# Patient Record
Sex: Female | Born: 1940 | Race: White | Hispanic: No | Marital: Married | Smoking: Never smoker
Health system: Southern US, Community
[De-identification: ages and names within clinical notes are randomized; demographics above are authoritative.]

## PROBLEM LIST (undated history)

## (undated) DIAGNOSIS — M1990 Unspecified osteoarthritis, unspecified site: Secondary | ICD-10-CM

## (undated) DIAGNOSIS — E876 Hypokalemia: Secondary | ICD-10-CM

## (undated) DIAGNOSIS — H269 Unspecified cataract: Secondary | ICD-10-CM

## (undated) DIAGNOSIS — I10 Essential (primary) hypertension: Secondary | ICD-10-CM

## (undated) DIAGNOSIS — G2579 Other drug induced movement disorders: Secondary | ICD-10-CM

## (undated) DIAGNOSIS — I2510 Atherosclerotic heart disease of native coronary artery without angina pectoris: Secondary | ICD-10-CM

## (undated) DIAGNOSIS — R609 Edema, unspecified: Secondary | ICD-10-CM

## (undated) DIAGNOSIS — I219 Acute myocardial infarction, unspecified: Secondary | ICD-10-CM

## (undated) DIAGNOSIS — K648 Other hemorrhoids: Secondary | ICD-10-CM

## (undated) DIAGNOSIS — R600 Localized edema: Secondary | ICD-10-CM

## (undated) DIAGNOSIS — I499 Cardiac arrhythmia, unspecified: Secondary | ICD-10-CM

## (undated) DIAGNOSIS — I480 Paroxysmal atrial fibrillation: Secondary | ICD-10-CM

## (undated) DIAGNOSIS — K5790 Diverticulosis of intestine, part unspecified, without perforation or abscess without bleeding: Secondary | ICD-10-CM

## (undated) DIAGNOSIS — K219 Gastro-esophageal reflux disease without esophagitis: Secondary | ICD-10-CM

## (undated) DIAGNOSIS — E1142 Type 2 diabetes mellitus with diabetic polyneuropathy: Secondary | ICD-10-CM

## (undated) DIAGNOSIS — R42 Dizziness and giddiness: Secondary | ICD-10-CM

## (undated) DIAGNOSIS — E119 Type 2 diabetes mellitus without complications: Secondary | ICD-10-CM

## (undated) DIAGNOSIS — I495 Sick sinus syndrome: Secondary | ICD-10-CM

## (undated) DIAGNOSIS — R079 Chest pain, unspecified: Secondary | ICD-10-CM

## (undated) DIAGNOSIS — G459 Transient cerebral ischemic attack, unspecified: Secondary | ICD-10-CM

## (undated) DIAGNOSIS — R0600 Dyspnea, unspecified: Secondary | ICD-10-CM

## (undated) DIAGNOSIS — K644 Residual hemorrhoidal skin tags: Secondary | ICD-10-CM

## (undated) DIAGNOSIS — E785 Hyperlipidemia, unspecified: Secondary | ICD-10-CM

## (undated) DIAGNOSIS — M797 Fibromyalgia: Secondary | ICD-10-CM

## (undated) DIAGNOSIS — R9439 Abnormal result of other cardiovascular function study: Secondary | ICD-10-CM

## (undated) DIAGNOSIS — I639 Cerebral infarction, unspecified: Secondary | ICD-10-CM

## (undated) DIAGNOSIS — G43109 Migraine with aura, not intractable, without status migrainosus: Secondary | ICD-10-CM

## (undated) DIAGNOSIS — T7840XA Allergy, unspecified, initial encounter: Secondary | ICD-10-CM

## (undated) HISTORY — DX: Other hemorrhoids: K64.8

## (undated) HISTORY — DX: Edema, unspecified: R60.9

## (undated) HISTORY — DX: Transient cerebral ischemic attack, unspecified: G45.9

## (undated) HISTORY — DX: Allergy, unspecified, initial encounter: T78.40XA

## (undated) HISTORY — DX: Fibromyalgia: M79.7

## (undated) HISTORY — DX: Localized edema: R60.0

## (undated) HISTORY — DX: Chest pain, unspecified: R07.9

## (undated) HISTORY — PX: NECK SURGERY: SHX720

## (undated) HISTORY — DX: Unspecified cataract: H26.9

## (undated) HISTORY — DX: Type 2 diabetes mellitus without complications: E11.9

## (undated) HISTORY — PX: PACEMAKER INSERTION: SHX728

## (undated) HISTORY — PX: APPENDECTOMY: SHX54

## (undated) HISTORY — DX: Essential (primary) hypertension: I10

## (undated) HISTORY — DX: Other drug induced movement disorders: G25.79

## (undated) HISTORY — DX: Type 2 diabetes mellitus with diabetic polyneuropathy: E11.42

## (undated) HISTORY — DX: Hyperlipidemia, unspecified: E78.5

## (undated) HISTORY — DX: Cerebral infarction, unspecified: I63.9

## (undated) HISTORY — PX: UPPER GASTROINTESTINAL ENDOSCOPY: SHX188

## (undated) HISTORY — DX: Atherosclerotic heart disease of native coronary artery without angina pectoris: I25.10

## (undated) HISTORY — DX: Residual hemorrhoidal skin tags: K64.4

## (undated) HISTORY — DX: Cardiac arrhythmia, unspecified: I49.9

## (undated) HISTORY — DX: Gastro-esophageal reflux disease without esophagitis: K21.9

## (undated) HISTORY — DX: Migraine with aura, not intractable, without status migrainosus: G43.109

## (undated) HISTORY — PX: COLONOSCOPY: SHX5424

## (undated) HISTORY — DX: Unspecified osteoarthritis, unspecified site: M19.90

## (undated) HISTORY — PX: OTHER SURGICAL HISTORY: SHX169

## (undated) HISTORY — DX: Sick sinus syndrome: I49.5

## (undated) HISTORY — DX: Paroxysmal atrial fibrillation: I48.0

## (undated) HISTORY — DX: Dizziness and giddiness: R42

## (undated) HISTORY — DX: Dyspnea, unspecified: R06.00

## (undated) HISTORY — DX: Hypokalemia: E87.6

## (undated) HISTORY — PX: KNEE SURGERY: SHX244

## (undated) HISTORY — DX: Acute myocardial infarction, unspecified: I21.9

## (undated) HISTORY — DX: Abnormal result of other cardiovascular function study: R94.39

## (undated) HISTORY — DX: Diverticulosis of intestine, part unspecified, without perforation or abscess without bleeding: K57.90

## (undated) HISTORY — PX: ABDOMINAL HYSTERECTOMY: SHX81

---

## 1998-04-28 ENCOUNTER — Emergency Department (HOSPITAL_COMMUNITY): Admission: EM | Admit: 1998-04-28 | Discharge: 1998-04-28 | Payer: Self-pay | Admitting: Emergency Medicine

## 1998-09-05 ENCOUNTER — Ambulatory Visit (HOSPITAL_COMMUNITY): Admission: RE | Admit: 1998-09-05 | Discharge: 1998-09-05 | Payer: Self-pay | Admitting: Family Medicine

## 1998-09-12 ENCOUNTER — Emergency Department (HOSPITAL_COMMUNITY): Admission: EM | Admit: 1998-09-12 | Discharge: 1998-09-12 | Payer: Self-pay | Admitting: Emergency Medicine

## 1998-09-12 ENCOUNTER — Encounter: Payer: Self-pay | Admitting: Emergency Medicine

## 1998-12-26 ENCOUNTER — Ambulatory Visit (HOSPITAL_COMMUNITY): Admission: RE | Admit: 1998-12-26 | Discharge: 1998-12-26 | Payer: Self-pay | Admitting: Orthopedic Surgery

## 1999-10-16 ENCOUNTER — Encounter: Payer: Self-pay | Admitting: Family Medicine

## 1999-10-16 ENCOUNTER — Encounter: Admission: RE | Admit: 1999-10-16 | Discharge: 1999-10-16 | Payer: Self-pay | Admitting: Family Medicine

## 2000-04-19 ENCOUNTER — Encounter: Payer: Self-pay | Admitting: Family Medicine

## 2000-04-19 ENCOUNTER — Encounter: Admission: RE | Admit: 2000-04-19 | Discharge: 2000-04-19 | Payer: Self-pay | Admitting: Family Medicine

## 2000-10-19 ENCOUNTER — Encounter: Admission: RE | Admit: 2000-10-19 | Discharge: 2000-10-19 | Payer: Self-pay | Admitting: Family Medicine

## 2000-10-19 ENCOUNTER — Other Ambulatory Visit: Admission: RE | Admit: 2000-10-19 | Discharge: 2000-10-19 | Payer: Self-pay | Admitting: Family Medicine

## 2000-10-19 ENCOUNTER — Encounter: Payer: Self-pay | Admitting: Family Medicine

## 2001-06-27 ENCOUNTER — Emergency Department (HOSPITAL_COMMUNITY): Admission: EM | Admit: 2001-06-27 | Discharge: 2001-06-27 | Payer: Self-pay | Admitting: *Deleted

## 2001-10-13 ENCOUNTER — Encounter: Payer: Self-pay | Admitting: Emergency Medicine

## 2001-10-13 ENCOUNTER — Emergency Department (HOSPITAL_COMMUNITY): Admission: EM | Admit: 2001-10-13 | Discharge: 2001-10-13 | Payer: Self-pay | Admitting: Emergency Medicine

## 2001-11-25 ENCOUNTER — Other Ambulatory Visit: Admission: RE | Admit: 2001-11-25 | Discharge: 2001-11-25 | Payer: Self-pay | Admitting: Family Medicine

## 2001-11-25 ENCOUNTER — Encounter: Admission: RE | Admit: 2001-11-25 | Discharge: 2001-11-25 | Payer: Self-pay | Admitting: Family Medicine

## 2001-11-25 ENCOUNTER — Encounter: Payer: Self-pay | Admitting: Family Medicine

## 2001-12-13 ENCOUNTER — Ambulatory Visit (HOSPITAL_COMMUNITY): Admission: RE | Admit: 2001-12-13 | Discharge: 2001-12-13 | Payer: Self-pay | Admitting: Orthopedic Surgery

## 2002-12-01 ENCOUNTER — Encounter: Admission: RE | Admit: 2002-12-01 | Discharge: 2002-12-01 | Payer: Self-pay | Admitting: Family Medicine

## 2002-12-01 ENCOUNTER — Encounter: Payer: Self-pay | Admitting: Family Medicine

## 2002-12-05 ENCOUNTER — Other Ambulatory Visit: Admission: RE | Admit: 2002-12-05 | Discharge: 2002-12-05 | Payer: Self-pay | Admitting: Family Medicine

## 2003-05-28 ENCOUNTER — Encounter: Payer: Self-pay | Admitting: Emergency Medicine

## 2003-05-28 ENCOUNTER — Inpatient Hospital Stay (HOSPITAL_COMMUNITY): Admission: EM | Admit: 2003-05-28 | Discharge: 2003-05-30 | Payer: Self-pay | Admitting: Emergency Medicine

## 2003-12-13 ENCOUNTER — Encounter: Admission: RE | Admit: 2003-12-13 | Discharge: 2003-12-13 | Payer: Self-pay | Admitting: Family Medicine

## 2005-03-11 ENCOUNTER — Encounter: Admission: RE | Admit: 2005-03-11 | Discharge: 2005-03-11 | Payer: Self-pay | Admitting: Family Medicine

## 2005-06-05 ENCOUNTER — Encounter: Admission: RE | Admit: 2005-06-05 | Discharge: 2005-06-05 | Payer: Self-pay | Admitting: Family Medicine

## 2005-07-08 ENCOUNTER — Inpatient Hospital Stay (HOSPITAL_COMMUNITY): Admission: RE | Admit: 2005-07-08 | Discharge: 2005-07-09 | Payer: Self-pay | Admitting: Orthopaedic Surgery

## 2006-02-27 ENCOUNTER — Emergency Department (HOSPITAL_COMMUNITY): Admission: EM | Admit: 2006-02-27 | Discharge: 2006-02-27 | Payer: Self-pay | Admitting: Family Medicine

## 2006-06-18 ENCOUNTER — Encounter: Admission: RE | Admit: 2006-06-18 | Discharge: 2006-06-18 | Payer: Self-pay | Admitting: Family Medicine

## 2006-07-12 ENCOUNTER — Ambulatory Visit (HOSPITAL_COMMUNITY): Admission: RE | Admit: 2006-07-12 | Discharge: 2006-07-13 | Payer: Self-pay | Admitting: *Deleted

## 2007-07-13 DIAGNOSIS — I2510 Atherosclerotic heart disease of native coronary artery without angina pectoris: Secondary | ICD-10-CM | POA: Insufficient documentation

## 2007-07-21 ENCOUNTER — Encounter: Admission: RE | Admit: 2007-07-21 | Discharge: 2007-07-21 | Payer: Self-pay | Admitting: Family Medicine

## 2008-11-22 ENCOUNTER — Encounter: Admission: RE | Admit: 2008-11-22 | Discharge: 2008-11-22 | Payer: Self-pay | Admitting: Family Medicine

## 2009-11-27 ENCOUNTER — Encounter: Admission: RE | Admit: 2009-11-27 | Discharge: 2009-11-27 | Payer: Self-pay | Admitting: Family Medicine

## 2009-12-17 ENCOUNTER — Encounter (INDEPENDENT_AMBULATORY_CARE_PROVIDER_SITE_OTHER): Payer: Self-pay

## 2009-12-20 ENCOUNTER — Ambulatory Visit: Payer: Self-pay | Admitting: Internal Medicine

## 2010-01-01 ENCOUNTER — Ambulatory Visit: Payer: Self-pay | Admitting: Internal Medicine

## 2010-06-28 ENCOUNTER — Emergency Department (HOSPITAL_BASED_OUTPATIENT_CLINIC_OR_DEPARTMENT_OTHER): Admission: EM | Admit: 2010-06-28 | Discharge: 2010-06-28 | Payer: Self-pay | Admitting: Emergency Medicine

## 2010-09-25 ENCOUNTER — Encounter: Payer: Self-pay | Admitting: Internal Medicine

## 2010-11-18 ENCOUNTER — Ambulatory Visit
Admission: RE | Admit: 2010-11-18 | Discharge: 2010-11-18 | Payer: Self-pay | Source: Home / Self Care | Attending: Internal Medicine | Admitting: Internal Medicine

## 2010-11-18 ENCOUNTER — Encounter: Payer: Self-pay | Admitting: Internal Medicine

## 2010-11-18 DIAGNOSIS — I10 Essential (primary) hypertension: Secondary | ICD-10-CM | POA: Insufficient documentation

## 2010-11-18 DIAGNOSIS — Z950 Presence of cardiac pacemaker: Secondary | ICD-10-CM | POA: Insufficient documentation

## 2010-11-18 DIAGNOSIS — I498 Other specified cardiac arrhythmias: Secondary | ICD-10-CM | POA: Insufficient documentation

## 2010-12-04 NOTE — Letter (Signed)
Summary: [facility] Instructions  [facility]  [address] [place], [place] [address]   Phone: [phone]  Fax: [phone]       [name]    [birth year]    MRN: [id number]        Procedure Day Dorna Bloom: Wednesday 01/01/2010     Arrival Time: 8:00 am      Procedure Time: 9:00 am     Location of Procedure:                    _ x_  [facility] (4th Floor)   PREPARATION FOR COLONOSCOPY WITH MOVIPREP   Starting 5 days prior to your procedure Friday 2/25 do not eat nuts, seeds, popcorn, corn, beans, peas,  salads, or any raw vegetables.  Do not take any fiber supplements (e.g. Metamucil, Citrucel, and Benefiber).  THE DAY BEFORE YOUR PROCEDURE         DATE: Tuesday 3/1  1.  Drink clear liquids the entire day-NO SOLID FOOD  2.  Do not drink anything colored red or purple.  Avoid juices with pulp.  No orange juice.  3.  Drink at least 64 oz. (8 glasses) of fluid/clear liquids during the day to prevent dehydration and help the prep work efficiently.  CLEAR LIQUIDS INCLUDE: Water Jello Ice Popsicles Tea (sugar ok, no milk/cream) Powdered fruit flavored drinks Coffee (sugar ok, no milk/cream) Gatorade Juice: apple, white grape, white cranberry  Lemonade Clear bullion, consomm, broth Carbonated beverages (any kind) Strained chicken noodle soup Hard Candy                             4.  In the morning, mix first dose of MoviPrep solution:    Empty 1 Pouch A and 1 Pouch B into the disposable container    Add lukewarm drinking water to the top line of the container. Mix to dissolve    Refrigerate (mixed solution should be used within 24 hrs)  5.  Begin drinking the prep at 5:00 p.m. The MoviPrep container is divided by 4 marks.   Every 15 minutes drink the solution down to the next mark (approximately 8 oz) until the full liter is complete.   6.  Follow completed prep with 16 oz of clear liquid of your choice (Nothing red or purple).   Continue to drink clear liquids until bedtime.  7.  Before going to bed, mix second dose of MoviPrep solution:    Empty 1 Pouch A and 1 Pouch B into the disposable container    Add lukewarm drinking water to the top line of the container. Mix to dissolve    Refrigerate  THE DAY OF YOUR PROCEDURE      DATE: wednesday 3/2  Beginning at 4:00 a.m. (5 hours before procedure):         1. Every 15 minutes, drink the solution down to the next mark (approx 8 oz) until the full liter is complete.  2. Follow completed prep with 16 oz. of clear liquid of your choice.    3. You may drink clear liquids until 7:00 am (2 HOURS BEFORE PROCEDURE).   MEDICATION INSTRUCTIONS  Unless otherwise instructed, you should take regular prescription medications with a small sip of water   as early as possible the morning of your procedure.  Additional medication instructions: Do not take fluid pill am of procedure.  OTHER INSTRUCTIONS  You will need a responsible adult at least 68 years of age to accompany you and drive you home.   This person must remain in the waiting room during your procedure.  Wear loose fitting clothing that is easily removed.  Leave jewelry and other valuables at home.  However, you may wish to bring a book to read or  an iPod/MP3 player to listen to music as you wait for your procedure to start.  Remove all body piercing jewelry and leave at home.  Total time from sign-in until discharge is approximately 2-3 hours.  You should go home directly after your procedure and rest.  You can resume normal activities the  day after your procedure.  The day of your procedure you should not:   Drive   Make legal decisions   Operate machinery   Drink alcohol   Return to work  You will receive specific instructions about eating, activities and medications before you leave.    The above instructions have been reviewed and explained to me by   Ulis Rias RN   December 20, 2009 9:02 AM    I fully understand and can verbalize these instructions _____________________________ Date _________

## 2010-12-04 NOTE — Assessment & Plan Note (Signed)
Summary: pacer check/medtronic   History of Present Illness: [name] is referred today by Dr. Swaziland for ongoing PPM evaluation.  She was previously followed by Dr. Reyes Ivan.  She underwent PPM in 2007. Since then she has done well from an arrhythmia perspective. She notes that she walks regularly and she has had some dyspnea but is followed for this by Dr. Swaziland. No c/p or peripheral edema. She denies syncope or near syncope. She denies pain around her PPM site.  Past History:  Past Medical History: DM Dylipidemia  Family History: No premature CAD  Social History: Denies Tobacco or ETOH abuse  Review of Systems       All systems reviewed and negative except as noted in the HPI.  Vital Signs:  Patient profile:   70 year old female Weight:      170 pounds Pulse rate:   62 / minute BP sitting:   104 / 60  (left arm)  Vitals Entered By: Laurance Flatten CMA (November 18, 2010 4:22 PM)  Physical Exam  General:  Middle aged, well developed, well nourished, in no acute distress.  HEENT: normal Neck: supple. No JVD. Carotids 2+ bilaterally no bruits Cor: RRR no rubs, gallops or murmur Lungs: CTA Ab: soft, nontender. nondistended. No HSM. Good bowel sounds Ext: warm. no cyanosis, clubbing or edema Neuro: alert and oriented. Grossly nonfocal. affect pleasant    PPM Specifications Following MD:  Lewayne Bunting, MD     Referring MD:  Vonna Drafts PPM Vendor:  Medtronic     PPM Model Number:  [id number]     PPM Serial Number:  [id number] PPM DOI:  07/12/2006     PPM Implanting MD:  Charlynn Court  Lead 1    Location: RA     DOI: 07/12/2006     Model #: [id number]     Serial #: [id number]     Status: active Lead 2    Location: RV     DOI: 07/12/2006     Model #: [id number]     Serial #: [id number]     Status: active  Magnet Response Rate:  BOL 85 ERI 65  Indications:  Sick sinus syndrome   PPM Follow Up Remote Check?  No Battery Voltage:  2.77 V     Battery Est. Longevity:  6  years     Pacer Dependent:  Yes       PPM Device Measurements Atrium  Impedance: 461 ohms, Threshold: 0.875 V at 0.4 msec Right Ventricle  Amplitude: 16 mV, Impedance: 537 ohms, Threshold: 0.75 V at 0.4 msec  Episodes MS Episodes:  8     Percent Mode Switch:  <0.1%     Coumadin:  No Ventricular High Rate:  0     Atrial Pacing:  95.6%     Ventricular Pacing:  0.2%  Parameters Mode:  DDDR+     Lower Rate Limit:  60     Upper Rate Limit:  130 Paced AV Delay:  150     Sensed AV Delay:  120 Next Remote Date:  02/19/2011     Next Cardiology Appt Due:  11/03/2011 Tech Comments:  No parameter changes. Device function normal.  Carelink transmissions every 3 months.  ROV 1 year with Dr. Ladona Ridgel. Altha Harm, LPN  November 18, 2010 4:18 PM  MD Comments:  Agree with above.  Impression & Recommendations:  Problem # 1:  BRADYCARDIA (ICD-427.89) She has had no recurrent symptoms since her PPM. Will follow. Her updated  medication list for this problem includes:    Lisinopril 40 Mg Tabs (Lisinopril) .Marland Kitchen... Take one tablet by mouth daily    Aspirin 81 Mg Tbec (Aspirin) .Marland Kitchen... Take one tablet by mouth daily    Amlodipine Besylate 2.5 Mg Tabs (Amlodipine besylate) .Marland Kitchen... Take one tablet by mouth daily  Problem # 2:  CARDIAC PACEMAKER IN SITU (ICD-V45.01) Her device is working normally. Will follow.  Problem # 3:  ESSENTIAL HYPERTENSION, BENIGN (ICD-401.1) Her blood pressure is very well controlled on medical therapy. No change in her meds. Her updated medication list for this problem includes:    Lisinopril 40 Mg Tabs (Lisinopril) .Marland Kitchen... Take one tablet by mouth daily    Aspirin 81 Mg Tbec (Aspirin) .Marland Kitchen... Take one tablet by mouth daily    Hydrochlorothiazide 25 Mg Tabs (Hydrochlorothiazide) .Marland Kitchen... Take one tablet by mouth daily.    Amlodipine Besylate 2.5 Mg Tabs (Amlodipine besylate) .Marland Kitchen... Take one tablet by mouth daily  Patient Instructions: 1)  Your physician wants you to follow-up in:  12 months  with Dr Court Joy will receive a reminder letter in the mail two months in advance. If you don't receive a letter, please call our office to schedule the follow-up appointment. 2)  Your physician recommends that you continue on your current medications as directed. Please refer to the Current Medication list given to you today.

## 2010-12-04 NOTE — Assessment & Plan Note (Signed)
Summary: [facility] Previsit  Clinical Lists Changes  Medications: Added new medication of MOVIPREP 100 GM  SOLR (PEG-KCL-NACL-NASULF-NA ASC-C) As per prep instructions. - Signed Rx of MOVIPREP 100 GM  SOLR (PEG-KCL-NACL-NASULF-NA ASC-C) As per prep instructions.;  #1 x 0;  Signed;  Entered by: Ulis Rias RN;  Authorized by: Iva Boop MD, [facility];  Method used: [facility]  [place] [address], [place], [place]  [address], Ph: [phone] or [phone], Fax: [phone] Observations: Added new observation of NKA: T (12/20/2009 8:49)    Prescriptions: MOVIPREP 100 GM  SOLR (PEG-KCL-NACL-NASULF-NA ASC-C) As per prep instructions.  #1 x 0   Entered by:   Ulis Rias RN   Authorized by:   Iva Boop MD, [facility]   Signed by:   Ulis Rias RN on 12/20/2009   Method used:   [facility]ronically to        [facility]  [place] [address] (retail)       [address]       [place], [place]  [address]       Ph: [phone] or [phone]       Fax: [phone]   RxID:   [id number]     Signed by Ulis Rias RN on 12/20/2009 at 9:01 AM

## 2010-12-04 NOTE — Miscellaneous (Signed)
Summary: Device preload  Clinical Lists Changes  Observations: Added new observation of PPM INDICATN: Sick sinus syndrome (09/25/2010 10:30) Added new observation of MAGNET RTE: BOL 85 ERI 65 (09/25/2010 10:30) Added new observation of PPMLEADSTAT2: active (09/25/2010 10:30) Added new observation of PPMLEADSER2: [id number] (09/25/2010 10:30) Added new observation of PPMLEADMOD2: 5076  (09/25/2010 10:30) Added new observation of PPMLEADDOI2: 07/12/2006  (09/25/2010 10:30) Added new observation of PPMLEADLOC2: RV  (09/25/2010 10:30) Added new observation of PPMLEADSTAT1: active  (09/25/2010 10:30) Added new observation of PPMLEADSER1: [id number]  (09/25/2010 10:30) Added new observation of PPMLEADMOD1: 5076  (09/25/2010 10:30) Added new observation of PPMLEADDOI1: 07/12/2006  (09/25/2010 10:30) Added new observation of PPMLEADLOC1: RA  (09/25/2010 10:30) Added new observation of PPM DOI: 07/12/2006  (09/25/2010 10:30) Added new observation of PPM SERL#: [id number]  (09/25/2010 10:30) Added new observation of PPM MODL#: [id number]  (09/25/2010 65:78) Added new observation of PACEMAKERMFG: Medtronic  (09/25/2010 10:30) Added new observation of PPM IMP MD: Charlynn Court  (09/25/2010 10:30) Added new observation of PPM REFER MD: Vonna Drafts  (09/25/2010 10:30) Added new observation of PACEMAKER MD: Lewayne Bunting, MD  (09/25/2010 10:30)      PPM Specifications Following MD:  Lewayne Bunting, MD     Referring MD:  Vonna Drafts PPM Vendor:  Medtronic     PPM Model Number:  ADDR01     PPM Serial Number:  [id number] PPM DOI:  07/12/2006     PPM Implanting MD:  Charlynn Court  Lead 1    Location: RA     DOI: 07/12/2006     Model #: [id number]     Serial #: [id number]     Status: active Lead 2    Location: RV     DOI: 07/12/2006     Model #: 6644     Serial #: [id number]     Status: active  Magnet Response Rate:  BOL 85 ERI 65  Indications:  Sick sinus syndrome

## 2010-12-04 NOTE — Procedures (Signed)
Summary: Colonoscopy  Patient: [name] Note: All result statuses are Final unless otherwise noted.  Tests: (1) Colonoscopy (COL)   COL Colonoscopy           DONE     [facility]     [address]     [place], [place]  [address]           COLONOSCOPY PROCEDURE REPORT           PATIENT:  Ashley [name]  MR#:  [id number]     BIRTHDATE:  27-Aug-1941, 69 yrs. old  GENDER:  female           ENDOSCOPIST:  Iva Boop, MD, [facility]     Referred by:           recall program           PROCEDURE DATE:  01/01/2010     PROCEDURE:  Higher-risk screening colonoscopy G0105           ASA CLASS:  Class II     INDICATIONS:  Elevated Risk Screening, family history of colon     cancer siblings (2 died at 62)     father     aunt           MEDICATIONS:   Fentanyl 75 mcg, Versed 8 mg IV           DESCRIPTION OF PROCEDURE:   After the risks benefits and     alternatives of the procedure were thoroughly explained, informed     consent was obtained.  Digital rectal exam was performed and     revealed no abnormalities.   The LB PCF-Q180AL [id number] endoscope     was introduced through the anus and advanced to the cecum, which     was identified by both the appendix and ileocecal valve, without     limitations.  The quality of the prep was excellent, using     MoviPrep.  The instrument was then slowly withdrawn as the colon     was fully examined.     Insertion: 3:54 minutes Withdrawal: 12:31 minutes     <<PROCEDUREIMAGES>>           FINDINGS:  Severe diverticulosis was found in the sigmoid colon.     This was otherwise a normal examination of the colon. Right colon     retroflex exam performed also.   Retroflexed views in the rectum     revealed internal and external hemorrhoids.    The scope was then     withdrawn from the patient and the procedure completed.           COMPLICATIONS:  None           ENDOSCOPIC IMPRESSION:     1) Severe diverticulosis in the sigmoid colon     2) Internal and external hemorrhoids     3) Otherwise normal examination, excellent prep     4) Family history of colon cancer (3 first-degree and one     second-degree)     RECOMMENDATIONS:     Pursue genetic counselling and screening. We discussed prior to     sedation. She can call my office and we can arrange.           REPEAT EXAM:  In 2 - 3 year(s) for routine colonoscopy for family     history of colon cancer.  Sooner than 5 years due to strong family     history. Interval  could change if new information obtained from     genetic counselling.           Iva Boop, MD, [facility]           CC:  Herb Grays, MD     The Patient           n.     Rosalie Doctor:   Iva Boop at 01/01/2010 09:59 AM           [name], [id number]  Note: An exclamation mark (!) indicates a result that was not dispersed into the flowsheet. Document Creation Date: 01/01/2010 10:01 AM _______________________________________________________________________  (1) Order result status: Final Collection or observation date-time: 01/01/2010 09:50 Requested date-time:  Receipt date-time:  Reported date-time:  Referring Physician:   Ordering Physician: Stan Head [phone]) Specimen Source:  Source: Launa Grill Order Number: [id number] Lab site:   Appended Document: Colonoscopy    Clinical Lists Changes  Observations: Added new observation of COLONNXTDUE: 01/2012 (01/01/2010 12:48)

## 2010-12-18 NOTE — Cardiovascular Report (Signed)
Summary: Office Visit   Office Visit   Imported By: Roderic Ovens 12/10/2010 10:03:10  _____________________________________________________________________  External Attachment:    Type:   Image     Comment:   External Document

## 2011-01-01 ENCOUNTER — Other Ambulatory Visit: Payer: Self-pay | Admitting: Family Medicine

## 2011-01-01 DIAGNOSIS — Z1231 Encounter for screening mammogram for malignant neoplasm of breast: Secondary | ICD-10-CM

## 2011-01-14 ENCOUNTER — Ambulatory Visit: Payer: Self-pay

## 2011-01-16 LAB — LIPASE, BLOOD: Lipase: 132 U/L (ref 23–300)

## 2011-01-16 LAB — COMPREHENSIVE METABOLIC PANEL
ALT: 35 U/L (ref 0–35)
AST: 44 U/L — ABNORMAL HIGH (ref 0–37)
Albumin: 4.1 g/dL (ref 3.5–5.2)
Alkaline Phosphatase: 63 U/L (ref 39–117)
BUN: 34 mg/dL — ABNORMAL HIGH (ref 6–23)
CO2: 25 mEq/L (ref 19–32)
Calcium: 9.3 mg/dL (ref 8.4–10.5)
Chloride: 103 mEq/L (ref 96–112)
Creatinine, Ser: 1.1 mg/dL (ref 0.4–1.2)
GFR calc Af Amer: 60 mL/min — ABNORMAL LOW (ref >60)
GFR calc non Af Amer: 50 mL/min — ABNORMAL LOW (ref >60)
Glucose, Bld: 87 mg/dL (ref 70–99)
Potassium: 3.6 mEq/L (ref 3.5–5.1)
Sodium: 138 mEq/L (ref 135–145)
Total Bilirubin: 0.8 mg/dL (ref 0.3–1.2)
Total Protein: 7.3 g/dL (ref 6.0–8.3)

## 2011-01-16 LAB — URINALYSIS, ROUTINE W REFLEX MICROSCOPIC
Bilirubin Urine: NEGATIVE
Glucose, UA: NEGATIVE mg/dL
Hgb urine dipstick: NEGATIVE
Ketones, ur: NEGATIVE mg/dL
Nitrite: NEGATIVE
Protein, ur: NEGATIVE mg/dL
Specific Gravity, Urine: 1.008 (ref 1.005–1.030)
Urobilinogen, UA: 0.2 mg/dL (ref 0.0–1.0)
pH: 5.5 (ref 5.0–8.0)

## 2011-01-16 LAB — DIFFERENTIAL
Basophils Absolute: 0.1 10*3/uL (ref 0.0–0.1)
Basophils Relative: 1 % (ref 0–1)
Eosinophils Absolute: 0.1 10*3/uL (ref 0.0–0.7)
Eosinophils Relative: 1 % (ref 0–5)
Lymphocytes Relative: 28 % (ref 12–46)
Lymphs Abs: 1.7 10*3/uL (ref 0.7–4.0)
Monocytes Absolute: 0.7 10*3/uL (ref 0.1–1.0)
Monocytes Relative: 11 % (ref 3–12)
Neutro Abs: 3.7 10*3/uL (ref 1.7–7.7)
Neutrophils Relative %: 59 % (ref 43–77)

## 2011-01-16 LAB — CBC
HCT: 38.1 % (ref 36.0–46.0)
Hemoglobin: 13.4 g/dL (ref 12.0–15.0)
MCH: 31.2 pg (ref 26.0–34.0)
MCHC: 35 g/dL (ref 30.0–36.0)
MCV: 89.1 fL (ref 78.0–100.0)
Platelets: 208 10*3/uL (ref 150–400)
RBC: 4.27 MIL/uL (ref 3.87–5.11)
RDW: 12.9 % (ref 11.5–15.5)
WBC: 6.3 10*3/uL (ref 4.0–10.5)

## 2011-01-16 LAB — POCT CARDIAC MARKERS
CKMB, poc: 4.4 ng/mL (ref 1.0–8.0)
Myoglobin, poc: 147 ng/mL (ref 12–200)
Troponin i, poc: <0.05 ng/mL (ref 0.00–0.09)

## 2011-01-28 ENCOUNTER — Ambulatory Visit
Admission: RE | Admit: 2011-01-28 | Discharge: 2011-01-28 | Disposition: A | Discharge status: Home / Self Care | Payer: Medicare Other | Source: Ambulatory Visit | Attending: Family Medicine | Admitting: Family Medicine

## 2011-01-28 DIAGNOSIS — Z1231 Encounter for screening mammogram for malignant neoplasm of breast: Secondary | ICD-10-CM

## 2011-02-19 ENCOUNTER — Other Ambulatory Visit: Payer: Self-pay | Admitting: Internal Medicine

## 2011-02-19 ENCOUNTER — Ambulatory Visit (INDEPENDENT_AMBULATORY_CARE_PROVIDER_SITE_OTHER): Payer: Medicare Other | Admitting: *Deleted

## 2011-02-19 DIAGNOSIS — Z950 Presence of cardiac pacemaker: Secondary | ICD-10-CM

## 2011-02-19 DIAGNOSIS — I495 Sick sinus syndrome: Secondary | ICD-10-CM

## 2011-02-25 ENCOUNTER — Encounter: Payer: Self-pay | Admitting: *Deleted

## 2011-02-27 NOTE — Progress Notes (Signed)
Pacer remote check  

## 2011-03-20 NOTE — Op Note (Signed)
NAME:  LEEBA, [name]           ACCOUNT NO.:  [id number]   MEDICAL RECORD NO.:  [id number]          PATIENT TYPE:  AMB   LOCATION:  SDS                          FACILITY:  [facility]   PHYSICIAN:  Sharolyn Douglas, M.D.        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   PROCEDURE:  1.  Exploration of C5-6 fusion.  2.  Anterior cervical diskectomy C6-7.  3.  Anterior cervical arthrodesis C6-7 with placement of allograft      prosthesis spacer packed with local autogenous bone graft.  4.  Anterior cervical plating C6-7 using the Abbott spine system.   SURGEON:  Sharolyn Douglas, M.D.   ASSISTANT:  Verlin Fester, P.A.   ANESTHESIA:  General endotracheal.   COMPLICATIONS:  None.   ESTIMATED BLOOD LOSS:  Minimal.   INDICATIONS:  The patient is a pleasant 70 year old female who is status  post previous ACDF instrumented at C5-6 in the early 1990s. She did well  after that surgery. Unfortunately, has developed adjacent segment  degenerative changes at C6-7 below the fusion with severe right upper  extremity radiculopathy. She has she failed to respond to conservative  measures and has elected to undergo extension of fusion with allograft and  plate. Risk and benefits reviewed.   PROCEDURE:  The patient was identified in the holding area, taken to the  operating room, underwent general endotracheal anesthesia without  difficulty, given prophylactic IV antibiotics. Carefully positioned supine  with the Mayfield head rest with the neck in slight extension.  The neck was  prepped and draped in the usual sterile fashion. A transverse incision was  made below her previous incision on the left side of the neck. Dissection  was carried sharply through the platysma. There was scarring noted in the  plane between the  SCM and strap muscles and this was developed down to the  prevertebral space. Again, the carotid sheath appeared to be scarred and  careful dissection, mostly with blunt use of a Kitner allowed for exposure  of the osteophyte at C6-7. The esophagus, trachea, carotid sheath were  identified and protected at all times.  A spinal needle was placed into the  C6-7 disk space. X-ray taken to confirm location. We then elevated the  longus coli muscles out over the C6-7 disk space bilaterally. Deep shadow  line retractor placed. A Wetzel rongeur was used to remove the anterior  osteophyte at C6-7. We then extended dissection up to the previous C5-6  level and carefully explored the previous fusion mass using the  electrocautery. There was a solid fusion noted between the C5 and C6  vertebral bodies. No motion was evident.  We then placed Caspar distraction  pins into the C6 and C7 vertebral bodies. Gentle distraction was applied.  Diskectomy carried back to the posterior longitudinal ligament. The disk  material was very degenerative.  The disk space was narrowed. There was a  large uncovertebral  spur which was taken down with the high-speed bur. The  cartilaginous endplates were scraped.  Foraminotomies completed bilaterally.  Vertebral margins undercut. We then placed a 6 mm allograft prosthesis  spacer packed with local bone graft obtained from the drill shavings. This  was countersunk 2 mm into the interspace. We then placed an anterior  cervical plate 22 mm in length with four 12 mm screws. We ensured that the  locking mechanism remained engaged. The screw purchase was good. The wound  was irrigated. Hemostasis was achieved. The esophagus, trachea, carotid  sheath examined. There were no apparent injuries. A Penrose drain left in  place. Platysma closed with interrupted 2-0 Vicryl, subcutaneous layer  closed with interrupted 3-0 Vicryl followed by a running 4-0 subcuticular  Vicryl suture on  the skin edges. Benzoin and Steri-Strips placed. Sterile  dressing applied. Cervical collar placed. The patient was extubated without  difficulty and transferred to recovery in stable condition, able to move her  upper and lower extremities.      Sharolyn Douglas, M.D.  Electronically Signed     MC/MEDQ  D:  07/08/2005  T:  07/08/2005  Job:  [id number]

## 2011-03-20 NOTE — H&P (Signed)
NAME:  CELIA, FRIEDLAND NO.:  [id number]   MEDICAL RECORD NO.:  [id number]                   PATIENT TYPE:  INP   LOCATION:  1826                                 FACILITY:  [facility]   PHYSICIAN:  Peter M. Swaziland, M.D.               DATE OF BIRTH:  26-May-1941   DATE OF ADMISSION:  05/28/2003  DATE OF DISCHARGE:                                HISTORY & PHYSICAL   HISTORY OF PRESENT ILLNESS:  The patient is a 70 year old white female with  a history of hypercholesterolemia, who presents with refractory chest pain  today.  She states she was doing well until approximately 11 p.m. last  night, when she developed severe substernal chest pain associated with  weakness in her arms, and some jaw discomfort.  The pain was graded at  10/10, and was unremitting throughout the night.  She came to the emergency  room today for an evaluation.  She equates her pain as feeling that her  chest was going to explode.  She had no nausea, vomiting, or shortness of  breath.  She has no prior history of chest pain or cardiac disease.  A  cardiology consultation was obtained approximately 14 hours after the onset  of pain.  With aggressive medical therapy her pain has decreased in  intensity from 10/10 to 5/10.   PAST MEDICAL/SURGICAL HISTORY:  1. Significant for hypercholesterolemia.  2. She has had a previous hysterectomy.  3. She has had previous neck surgery for disk fusion.  4. She has had a previous T&A.  5. She is status post right knee surgery x2.  She has no history of hypertension or diabetes.   ALLERGIES:  CODEINE, which causes hallucinations.   MEDICATIONS:  1. Lipitor 20 mg daily.  2. Premarin 1.25 mg daily.  3. Vioxx 25 mg daily.   SOCIAL HISTORY:  The patient is married.  Her husband is a Visual merchandiser.  She  denies tobacco or alcohol use.  She has two children.  One daughter has end-  stage renal disease, secondary to juvenile diabetes, and is currently in the  intensive care unit with an infected dialysis graft.   FAMILY HISTORY:  Strongly positive for cancer and heart disease.   REVIEW OF SYSTEMS:  She denies any claudication symptoms.  No orthopnea or  PND.  She does report a two to three-month-history of fatigue.  Other review  of systems is negative.   PHYSICAL EXAMINATION:  GENERAL:  The patient is a pleasant white female, in  no apparent distress.  VITAL SIGNS:  Blood pressure 116/72, pulse 58 and regular, respirations 20.  She is afebrile.  HEENT:  The patient wears glasses.  Pupils equal, round, reactive to light  and accommodation.  Conjunctivae clear.  Oropharynx clear.  NECK:  Supple without jugular venous distention, adenopathy, thyromegaly, or  bruits.  LUNGS:  Clear.  HEART:  The  cardiac examination is without gallops, murmurs, or rubs.  ABDOMEN:  Soft, nontender.  No hepatosplenomegaly, masses, or bruits.  EXTREMITIES:  Without edema.  Pulses 2+ and symmetric.  NEUROLOGIC:  Examination is intact.   LABORATORY DATA:  An electrocardiogram shows normal sinus rhythm with very  minor nonspecific ST abnormality.  A chest x-ray shows no active disease.  Sodium 137, potassium 3.4, chloride  107, CO2 of 24, BUN 13, creatinine 0.6, glucose 104.  White count 4500,  hemoglobin 13.1, hematocrit 37.7, platelets 212,000.  Coags are normal.  By  point of care testing CPK-MB are 22.1 and 33.1 and 38.7.  Troponin I 2.2,  4.23 and 4.19.  Myoglobins are 151, 100 and 90.2.   IMPRESSION:  1. Non-Q-wave myocardial infarction.  Suspect a posterior myocardial     infarction by the fact that her electrocardiogram is unremarkable.  It     appears that we are seeing the tail-end of her infarction.  Her pain is     improving.  Her cardiac enzymes suggest that her acute event occurred     last night.  2. Hypercholesterolemia.  3. Post-menopausal.   PLAN:  The patient will be admitted to the intensive care unit.  She will be  treated with an  aggressive medical therapy including aspirin, heparin, IV  nitroglycerin and Integrilin.  Will avoid beta blockade due to her  bradycardia.  Plan on a cardiac catheterization in the morning.                                                 Peter M. Swaziland, M.D.    PMJ/MEDQ  D:  05/28/2003  T:  05/28/2003  Job:  [id number]   cc:   Quita Skye. Artis Flock, M.D.  [address]  [place]  [place] [address]  Fax: [phone]

## 2011-03-20 NOTE — Cardiovascular Report (Signed)
NAME:  Ashley, [name] NO.:  [id number]   MEDICAL RECORD NO.:  [id number]          PATIENT TYPE:  OIB   LOCATION:  2807                         FACILITY:  [facility]   PHYSICIAN:  Elmore Guise., M.D.DATE OF BIRTH:  11-28-1940   DATE OF PROCEDURE:  07/12/2006  DATE OF DISCHARGE:                              CARDIAC CATHETERIZATION   PROCEDURE:  Dual-chamber permanent pacemaker implant.   INDICATIONS FOR PROCEDURE:  Sick sinus syndrome.   DESCRIPTION OF PROCEDURE:  The patient was brought to the cardiac cath lab  after appropriate informed consent.  She was prepped and draped in a sterile  fashion.  Approximately 20 mL of 1% lidocaine was used for local anesthesia.  A 2-inch incision was made in the left deltopectoral groove.  A subcutaneous  pocket was then made with blunt and Bovie dissection.  Hemostasis was then  achieved.  A venogram was then performed showing the course of the left  axillary/subclavian vein.  The left axillary vein was then accessed in two  separate sticks under fluoroscopic guidance.  Two 7-French safety sheaths  were placed over the retained wires.  The ventricular lead was then placed,  multiple attempts were made to place it in the septum.  However, poor  threshold or poor sensing was obtained therefore RV lead was placed in the  RV apex.  The following measurements were obtained, threshold is 0.4 volts  at 0.4 milliseconds with a current of 0.4 mA with impedance of 800 ohms.  R  waves measured 26.8 mV.  A 10 volt check was negative.  The atrial lead was  then placed. The following measurements were obtained, threshold of 0.9  volts at 0.5 milliseconds with a current of 1.4 mA, impedance 725 ohms.  P-  waves measured 3.7 mV with 10 volt check being negative.  The atrial lead is  a Medtronic active fixation lead 5076-45 cm, serial number [id number]. The  ventricular lead is an Medtronic active fixation 5076-52 cm, serial number  [id number]. After the leads were appropriately identified, they were placed  in the appropriate portions of the header.  Her generator is an Adapta  ADDR01, serial number [id number]. The pocket was copiously irrigated with  kanamycin solution.  The generator was sewn into the pocket.  The pocket was  then closed in 3 continuous layers with 2-0 followed by 2-0 followed by 4-0  Vicryl suture.  The patient tolerated the procedure well.  No apparent  complications.      Elmore Guise., M.D.  Electronically Signed     TWK/MEDQ  D:  07/12/2006  T:  07/12/2006  Job:  [id number]   cc:   Peter M. [place], M.D.

## 2011-03-20 NOTE — Cardiovascular Report (Signed)
   NAME:  [name], [name] NO.:  [id number]   MEDICAL RECORD NO.:  [id number]                   PATIENT TYPE:  INP   LOCATION:  2907                                 FACILITY:  [facility]   PHYSICIAN:  Peter M. [place], M.D.               DATE OF BIRTH:  Mar 10, 1941   DATE OF PROCEDURE:  05/29/2003  DATE OF DISCHARGE:                              CARDIAC CATHETERIZATION   INDICATION FOR PROCEDURE:  The patient is a 70 year old white female with  history of hypercholesterolemia who presents with prolonged episode of  severe chest pain associated with mild cardiac enzymes leak.  ECG was  unremarkable.   ACCESS:  Via the right femoral artery using standard Seldinger technique.   EQUIPMENT:  6-French 4 cm right and left Judkins catheter, 6-French pigtail  catheter, 6-French arterial sheath.   CONTRAST:  150 mL of Omnipaque.   MEDICATIONS:  Versed 1 mg IV, local anesthesia 1% Xylocaine.   HEMODYNAMIC DATA:  1. Aortic pressure was 158/75 with a mean of 113.  2. Left ventricular pressure was 159 with EDP of 23 mmHg.   ANGIOGRAPHIC DATA:  1. The left coronary artery arises and distributes normally.  The left main     coronary artery is short without significant disease.  2. Left anterior descending has minor irregularities in the proximal vessel     of 10-20%.  No obstructive disease is noted.  3. The left circumflex coronary artery is a large vessel and appears normal.  4. The right coronary artery is a dominant vessel and appears normal.   LEFT VENTRICULAR ANGIOGRAPHY:  Left ventricular angiography performed in the  RAO and LAO cranial views demonstrates normal left ventricular size and  contractility with normal systolic function.  No segmental wall motion  abnormalities are seen.  There is no mitral regurgitation or prolapse.  The  aortic root appears normal in caliber.    FINAL INTERPRETATION:  1. No significant atherosclerotic coronary artery  disease.  2. Normal left ventricular function.                                               Peter M. [place], M.D.    PMJ/MEDQ  D:  05/30/2003  T:  05/30/2003  Job:  [id number]   cc:   Quita Skye. Artis Flock, M.D.  [address]  [place]  [place] [address]  Fax: [phone]

## 2011-03-20 NOTE — Discharge Summary (Signed)
NAME:  [name], [name] NO.:  [id number]   MEDICAL RECORD NO.:  [id number]          PATIENT TYPE:  OIB   LOCATION:  3703                         FACILITY:  [facility]   PHYSICIAN:  Elmore Guise., M.D.DATE OF BIRTH:  1941-07-31   DATE OF ADMISSION:  07/12/2006  DATE OF DISCHARGE:  07/13/2006                                 DISCHARGE SUMMARY   DISCHARGE DIAGNOSIS:  1. Sick sinus syndrome status post dual-chamber permanent pacemaker      implant.  2. Hypertension.  3. Dyslipidemia.   HISTORY OF PRESENT ILLNESS:  The patient is very pleasant 70 year old white  female who presented with fatigue and malaise, found to have heart rate in  the high 30s and upper 40s.  She was admitted on July 12, 2006, for  pacemaker implant.   HOSPITAL COURSE:  The patient's hospital course was uncomplicated.  She had  a dual-chamber pacemaker implant on July 12, 2006. with a Medtronic  Adapta model.  Her postprocedure course was uncomplicated.  Her chest x-ray  showed appropriate lead position.  No pneumothorax and her pacemaker was  functioning within normal limits.  Her pacer site looked good.  There was  very minimal swelling.  Her bandage was removed, and her Steri-Strips were  Betadined.  She will be discharged home with post pacemaker restrictions.  She is to Betadine her Steri-Strips daily for the next 3 days.  She is to  keep the wound clean and dry for the next week.  I also instructed her not  to do any significant activity with her left arm, specifically lifting her  left arm over shoulder level for the next 3 weeks or lifting any heavy  objects greater than 10 pounds with her left arm for the next 3 weeks.  A  post pacemaker instruction sheet was given to the patient.   DISCHARGE MEDICATIONS:  1. Sular and Zocor as before.  2. Tylenol Extra Strength on a p.r.n. basis.   FOLLOWUP:  She will follow up with Dr. Peter Sw[place]and in 1-2 weeks for routine  follow-up.   She is to call the office with any questions or concerns.      Elmore Guise., M.D.  Electronically Signed     TWK/MEDQ  D:  07/13/2006  T:  07/13/2006  Job:  [id number]

## 2011-03-20 NOTE — Discharge Summary (Signed)
NAME:  [name], [name] NO.:  [id number]   MEDICAL RECORD NO.:  [id number]                   PATIENT TYPE:  INP   LOCATION:  2907                                 FACILITY:  [facility]   PHYSICIAN:  Peter M. [place], M.D.               DATE OF BIRTH:  01-Jan-1941   DATE OF ADMISSION:  05/28/2003  DATE OF DISCHARGE:  05/30/2003                                 DISCHARGE SUMMARY   HISTORY OF PRESENT ILLNESS:  Ms. Cathren Harsh is a 70 year old white female with a  history of hypercholesterolemia who presented with refractory severe chest  pain of approximately 14 hours duration prior to admission that was  unremitting. This was associated with some weakness in her arms and some  mild jaw discomfort. The patient was admitted for further evaluation. The  patient does have a history of hypercholesterolemia. Has had previous neck  fusion.   PAST MEDICAL HISTORY/SOCIAL HISTORY/FAMILY HISTORY/PHYSICAL EXAMINATION:  Please see admission history and physical.   LABORATORY DATA:  Cholesterol 171, triglycerides 119, HDL 50, LDL 97. Point  of care cardiac enzyme testing shows CPK MB of 22.2 then 33.1 then 38.7.  Troponin was elevated to a peak of 4.23. Myoglobins were normal and  declining. Subsequent CPK MB declined to 25.6 and on the day of discharge  was 3.7. White count was 4,500. Hemoglobin 13.1 and hematocrit 37.7.  Platelets 212,000. Sodium 137, potassium 3.4, chloride 107, CO2 24, BUN 13,  creatinine 0.6, glucose of 104. Coagulation studies were normal. Calcium was  normal. Sed rate and thyroid function studies were still pending at the time  of discharge.   Chest x-ray showed no active disease.   ECG showed normal sinus rhythm with non-specific ST abnormality.   HOSPITAL COURSE:  The patient was admitted with tentative diagnosis of non-Q  wave myocardial infarction based on her elevated cardiac enzymes. She is  admitted to Intensive Care Unit and treated with  aspirin, IV Heparin, IV  Integrelin, and Nitroglycerin. Due to her resting bradycardia, we did not  use beta blocker. Her chest pain improved approximately 50% from the  emergency room and by the following morning, had improved 80%. She still had  some persistent chest pain that then resolved the second hospital day. Her  CPK's returned to normal. Her ECG showed no serial changes with persistent  non-specific STT wave changes. On May 29, 2003, she underwent cardiac  catheterization which demonstrated no significant coronary disease. Left  ventricular function was normal with ejection fraction of 55-60%. Based on  these findings, it was felt that the patient's presentation was related to  acute myocarditis that was resolving. The etiology was unknown. Recommended  treatment was rest and analgesics. The patient remained pain free and given  her stability, it was felt that she was stable to discharge on May 30, 2003.   DISCHARGE DIAGNOSES:  1. Acute myocarditis, resolving.  2. Hypercholesterolemia, controlled on Lipitor.   PLAN:  Will discharge on prior medications including Lipitor, Premarin,  Vioxx. Recommend aspirin daily and Darvocet N-100 1 q.4-6h. p.r.n.    FOLLOW UP:  The patient will followup with Dr. Sw[place]and in one week. Will also  obtain an echocardiogram as an outpatient to complete her workup.                                                Peter M. [place], M.D.    PMJ/MEDQ  D:  05/30/2003  T:  05/30/2003  Job:  [id number]   cc:   J.D. Penni Bombard, M.D.

## 2011-05-21 ENCOUNTER — Other Ambulatory Visit: Payer: Self-pay | Admitting: Internal Medicine

## 2011-05-21 ENCOUNTER — Ambulatory Visit (INDEPENDENT_AMBULATORY_CARE_PROVIDER_SITE_OTHER): Payer: Medicare Other | Admitting: *Deleted

## 2011-05-21 DIAGNOSIS — I495 Sick sinus syndrome: Secondary | ICD-10-CM

## 2011-05-21 DIAGNOSIS — Z950 Presence of cardiac pacemaker: Secondary | ICD-10-CM

## 2011-05-22 LAB — REMOTE PACEMAKER DEVICE
AL IMPEDENCE PM: 460 Ohm
AL THRESHOLD: 0.875 V
ATRIAL PACING PM: 96
BAMS-0001: 175 {beats}/min
BATTERY VOLTAGE: 2.77 V
RV LEAD AMPLITUDE: 22.4 mv
RV LEAD IMPEDENCE PM: 530 Ohm
RV LEAD THRESHOLD: 0.75 V
VENTRICULAR PACING PM: 0

## 2011-05-27 NOTE — Progress Notes (Signed)
Pacer remote check  

## 2011-06-09 ENCOUNTER — Encounter: Payer: Self-pay | Admitting: *Deleted

## 2011-08-27 ENCOUNTER — Encounter: Payer: Medicare Other | Admitting: *Deleted

## 2011-09-03 ENCOUNTER — Encounter: Payer: Self-pay | Admitting: *Deleted

## 2011-09-07 ENCOUNTER — Other Ambulatory Visit: Payer: Self-pay | Admitting: Internal Medicine

## 2011-09-07 ENCOUNTER — Encounter: Payer: Self-pay | Admitting: Internal Medicine

## 2011-09-07 ENCOUNTER — Ambulatory Visit (INDEPENDENT_AMBULATORY_CARE_PROVIDER_SITE_OTHER): Payer: Medicare Other | Admitting: *Deleted

## 2011-09-07 DIAGNOSIS — I495 Sick sinus syndrome: Secondary | ICD-10-CM

## 2011-09-07 DIAGNOSIS — Z950 Presence of cardiac pacemaker: Secondary | ICD-10-CM

## 2011-09-13 LAB — REMOTE PACEMAKER DEVICE
AL IMPEDENCE PM: 494 Ohm
AL THRESHOLD: 0.75 V
ATRIAL PACING PM: 96
BAMS-0001: 175 {beats}/min
BATTERY VOLTAGE: 2.77 V
RV LEAD AMPLITUDE: 22.4 mv
RV LEAD IMPEDENCE PM: 584 Ohm
RV LEAD THRESHOLD: 0.75 V
VENTRICULAR PACING PM: 0

## 2011-09-18 NOTE — Progress Notes (Signed)
Remote pacer check  

## 2011-09-25 ENCOUNTER — Encounter: Payer: Self-pay | Admitting: *Deleted

## 2011-11-18 ENCOUNTER — Encounter: Payer: Self-pay | Admitting: Neurology

## 2011-11-20 ENCOUNTER — Encounter: Payer: Self-pay | Admitting: Internal Medicine

## 2011-11-21 ENCOUNTER — Emergency Department (INDEPENDENT_AMBULATORY_CARE_PROVIDER_SITE_OTHER): Payer: Medicare Other

## 2011-11-21 ENCOUNTER — Emergency Department (HOSPITAL_BASED_OUTPATIENT_CLINIC_OR_DEPARTMENT_OTHER)
Admission: EM | Admit: 2011-11-21 | Discharge: 2011-11-21 | Disposition: A | Discharge status: Home / Self Care | Payer: Medicare Other | Attending: Emergency Medicine | Admitting: Emergency Medicine

## 2011-11-21 ENCOUNTER — Encounter (HOSPITAL_BASED_OUTPATIENT_CLINIC_OR_DEPARTMENT_OTHER): Payer: Self-pay | Admitting: Emergency Medicine

## 2011-11-21 DIAGNOSIS — K5730 Diverticulosis of large intestine without perforation or abscess without bleeding: Secondary | ICD-10-CM | POA: Insufficient documentation

## 2011-11-21 DIAGNOSIS — R109 Unspecified abdominal pain: Secondary | ICD-10-CM | POA: Insufficient documentation

## 2011-11-21 DIAGNOSIS — Z79899 Other long term (current) drug therapy: Secondary | ICD-10-CM | POA: Insufficient documentation

## 2011-11-21 DIAGNOSIS — M549 Dorsalgia, unspecified: Secondary | ICD-10-CM | POA: Insufficient documentation

## 2011-11-21 DIAGNOSIS — E785 Hyperlipidemia, unspecified: Secondary | ICD-10-CM | POA: Insufficient documentation

## 2011-11-21 DIAGNOSIS — Z7982 Long term (current) use of aspirin: Secondary | ICD-10-CM | POA: Insufficient documentation

## 2011-11-21 DIAGNOSIS — E119 Type 2 diabetes mellitus without complications: Secondary | ICD-10-CM | POA: Insufficient documentation

## 2011-11-21 DIAGNOSIS — K8020 Calculus of gallbladder without cholecystitis without obstruction: Secondary | ICD-10-CM

## 2011-11-21 LAB — BASIC METABOLIC PANEL
BUN: 22 mg/dL (ref 6–23)
CO2: 24 mEq/L (ref 19–32)
Calcium: 9.8 mg/dL (ref 8.4–10.5)
Chloride: 105 mEq/L (ref 96–112)
Creatinine, Ser: 1.3 mg/dL — ABNORMAL HIGH (ref 0.50–1.10)
GFR calc Af Amer: 47 mL/min — ABNORMAL LOW (ref >90)
GFR calc non Af Amer: 41 mL/min — ABNORMAL LOW (ref >90)
Glucose, Bld: 97 mg/dL (ref 70–99)
Potassium: 3.2 mEq/L — ABNORMAL LOW (ref 3.5–5.1)
Sodium: 143 mEq/L (ref 135–145)

## 2011-11-21 LAB — DIFFERENTIAL
Basophils Absolute: 0 K/uL (ref 0.0–0.1)
Basophils Relative: 0 % (ref 0–1)
Eosinophils Absolute: 0.1 K/uL (ref 0.0–0.7)
Eosinophils Relative: 1 % (ref 0–5)
Lymphocytes Relative: 30 % (ref 12–46)
Lymphs Abs: 1.8 K/uL (ref 0.7–4.0)
Monocytes Absolute: 0.9 K/uL (ref 0.1–1.0)
Monocytes Relative: 15 % — ABNORMAL HIGH (ref 3–12)
Neutro Abs: 3.1 K/uL (ref 1.7–7.7)
Neutrophils Relative %: 53 % (ref 43–77)

## 2011-11-21 LAB — CBC
HCT: 43.7 % (ref 36.0–46.0)
Hemoglobin: 15.5 g/dL — ABNORMAL HIGH (ref 12.0–15.0)
MCH: 29.9 pg (ref 26.0–34.0)
MCHC: 35.5 g/dL (ref 30.0–36.0)
MCV: 84.2 fL (ref 78.0–100.0)
Platelets: 238 10*3/uL (ref 150–400)
RBC: 5.19 MIL/uL — ABNORMAL HIGH (ref 3.87–5.11)
RDW: 13.7 % (ref 11.5–15.5)
WBC: 5.8 10*3/uL (ref 4.0–10.5)

## 2011-11-21 MED ORDER — ONDANSETRON HCL 4 MG PO TABS: 4.0000 mg | ORAL_TABLET | Freq: Four times a day (QID) | ORAL | Status: AC

## 2011-11-21 MED ORDER — POTASSIUM CHLORIDE CRYS ER 20 MEQ PO TBCR
40.0000 meq | EXTENDED_RELEASE_TABLET | Freq: Once | ORAL | Status: AC
  Administered 2011-11-21: 40 meq via ORAL
  Filled 2011-11-21: qty 2

## 2011-11-21 MED ORDER — IOHEXOL 300 MG/ML  SOLN
20.0000 mL | INTRAMUSCULAR | Status: AC
  Administered 2011-11-21: 20 mL via ORAL

## 2011-11-21 MED ORDER — ONDANSETRON HCL 4 MG/2ML IJ SOLN
4.0000 mg | Freq: Once | INTRAMUSCULAR | Status: AC
  Administered 2011-11-21: 4 mg via INTRAVENOUS
  Filled 2011-11-21: qty 2

## 2011-11-21 MED ORDER — POTASSIUM CHLORIDE CRYS ER 20 MEQ PO TBCR: 20.0000 meq | EXTENDED_RELEASE_TABLET | Freq: Two times a day (BID) | ORAL | Status: DC

## 2011-11-21 MED ORDER — SODIUM CHLORIDE 0.9 % IV BOLUS (SEPSIS)
1000.0000 mL | Freq: Once | INTRAVENOUS | Status: AC
  Administered 2011-11-21: 1000 mL via INTRAVENOUS

## 2011-11-21 NOTE — ED Provider Notes (Signed)
History     CSN: [id number]  Arrival date & time 11/21/11  1215   First MD Initiated Contact with Patient 11/21/11 1401      Chief Complaint  Patient presents with  . Abdominal Pain  . Emesis  . Diarrhea    (Consider location/radiation/quality/duration/timing/severity/associated sxs/prior treatment) Patient is a 71 y.o. female presenting with abdominal pain. The history is provided by the patient. No language interpreter was used.  Abdominal Pain The primary symptoms of the illness include abdominal pain, nausea, vomiting and diarrhea. The current episode started 2 days ago. The onset of the illness was gradual. The problem has been gradually worsening.  Nausea began 3 to 5 days ago. The nausea is associated with eating.  Associated with: hx of diverticulosis. The patient states that she believes she is currently not pregnant. The patient has not had a change in bowel habit. Additional symptoms associated with the illness include back pain. 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 Significant associated medical issues do not include liver disease.  Pt complains of lower abdominal pain.  Pt reports she has been having vomitting and diarrhea  Past Medical History  Diagnosis Date  . DM (diabetes mellitus)   . Dyslipidemia   . H/O: hysterectomy     Past Surgical History  Procedure Date  . Right knee surgery     x2  . Neck surgery     Family History  Problem Relation Age of Onset  . Coronary artery disease Neg Hx   . Hypertension Neg Hx   . Diabetes Neg Hx   . Cancer Other     History  Substance Use Topics  . Smoking status: Never Smoker   . Smokeless tobacco: Not on file  . Alcohol Use: Not on file    OB History    Grav Para Term Preterm Abortions TAB SAB Ect Mult Living                  Review of Systems  Gastrointestinal: Positive for nausea, vomiting, abdominal pain and diarrhea.  Musculoskeletal: Positive for back pain.    All other systems reviewed and are negative.    Allergies  Codeine  Home Medications   Current Outpatient Rx  Name Route Sig Dispense Refill  . AMLODIPINE BESYLATE PO Oral Take 2.5 mg by mouth daily.    . ASPIRIN 81 MG PO TABS Oral Take 160 mg by mouth daily.    Marland Kitchen GLIMEPIRIDE 2 MG PO TABS Oral Take 2 mg by mouth daily before breakfast.    . HYDROCHLOROTHIAZIDE 25 MG PO TABS Oral Take 25 mg by mouth daily.    Marland Kitchen OMEPRAZOLE 20 MG PO CPDR Oral Take 20 mg by mouth daily.    Marland Kitchen ROSUVASTATIN CALCIUM 40 MG PO TABS Oral Take 40 mg by mouth daily.      BP 113/53  Pulse 63  Temp(Src) 97.8 F (36.6 C) (Oral)  Resp 16  Ht 5\' 5"  (1.651 m)  Wt 168 lb (76.204 kg)  BMI 27.96 kg/m2  SpO2 98%  Physical Exam  Vitals reviewed. Constitutional: She is oriented to person, place, and time. She appears well-developed and well-nourished.  HENT:  Head: Normocephalic and atraumatic.  Right Ear: External ear normal.  Left Ear: External ear normal.  Mouth/Throat: Oropharynx is clear and moist.  Eyes: Pupils are equal, round, and reactive to light.  Neck: Normal range of motion. Neck supple.  Cardiovascular: Normal rate and regular rhythm.   Pulmonary/Chest: Effort normal  and breath sounds normal.  Abdominal: Soft. There is no tenderness.  Musculoskeletal: Normal range of motion.  Neurological: She is alert and oriented to person, place, and time.  Skin: Skin is warm.  Psychiatric: She has a normal mood and affect.    ED Course  Procedures (including critical care time)  Labs Reviewed  BASIC METABOLIC PANEL - Abnormal; Notable for the following:    Potassium 3.2 (*)    Creatinine, Ser 1.30 (*)    GFR calc non Af Amer 41 (*)    GFR calc Af Amer 47 (*)    All other components within normal limits  CBC - Abnormal; Notable for the following:    RBC 5.19 (*)    Hemoglobin 15.5 (*)    All other components within normal limits  DIFFERENTIAL - Abnormal; Notable for the following:    Monocytes  Relative 15 (*)    All other components within normal limits   No results found.   No diagnosis found.    MDM  Pt given Iv fluids x 2 liters,  Pt reports her symptoms feel better.  Pt's Ct scan shows diverticulosis, no diverticulitis.  Pt advised to follow up her Gi doctor for recheck.        Langston Masker, [place] 11/22/11 2251  Langston Masker, [place] 11/22/11 2252

## 2011-11-21 NOTE — ED Notes (Signed)
N/V/D x 4 days.  No known fever.  Unable to keep food/fluids down.  Visits nursing home regularly.

## 2011-11-21 NOTE — ED Notes (Signed)
CT abdomen / pelvis changed to W/O.  Patient refuses xray dye.  "she doesn't like the way it makes her feel".  Ok'd by Colin Broach.

## 2011-11-21 NOTE — ED Provider Notes (Signed)
Pt seen and evaluated with Clydie Braun.  Presents with abd pain, nausea, diarrhea.  Has hx of diverticulitis.  Awaiting labs, CT abd/pelvis.  Denies need for pain meds  Rolan Bucco, MD 11/21/11 1600

## 2011-11-24 ENCOUNTER — Encounter: Payer: Medicare Other | Admitting: Internal Medicine

## 2011-11-25 NOTE — ED Provider Notes (Signed)
Medical screening examination/treatment/procedure(s) were performed by non-physician practitioner and as supervising physician I was immediately available for consultation/collaboration.   Rolan Bucco, MD 11/25/11 [phone]

## 2011-11-26 ENCOUNTER — Telehealth: Payer: Self-pay | Admitting: Cardiology

## 2011-11-26 ENCOUNTER — Encounter: Payer: Self-pay | Admitting: Internal Medicine

## 2011-11-26 ENCOUNTER — Ambulatory Visit (INDEPENDENT_AMBULATORY_CARE_PROVIDER_SITE_OTHER): Payer: Medicare Other | Admitting: Internal Medicine

## 2011-11-26 DIAGNOSIS — Z950 Presence of cardiac pacemaker: Secondary | ICD-10-CM

## 2011-11-26 DIAGNOSIS — I498 Other specified cardiac arrhythmias: Secondary | ICD-10-CM

## 2011-11-26 DIAGNOSIS — I10 Essential (primary) hypertension: Secondary | ICD-10-CM

## 2011-11-26 LAB — PACEMAKER DEVICE OBSERVATION
AL IMPEDENCE PM: 481 Ohm
AL THRESHOLD: 0.5 V
ATRIAL PACING PM: 97
BAMS-0001: 175 {beats}/min
BATTERY VOLTAGE: 2.76 V
RV LEAD AMPLITUDE: 31.36 mv
RV LEAD IMPEDENCE PM: 575 Ohm
RV LEAD THRESHOLD: 0.75 V
VENTRICULAR PACING PM: 0

## 2011-11-26 NOTE — Telephone Encounter (Signed)
Walk in pt Form " When is she Scheduled to See [place] Again" please call sent to Message Nurse 11/26/11/KM

## 2011-11-26 NOTE — Telephone Encounter (Signed)
The patient saw Dr. Ladona Ridgel today and recommended she see Dr. [place]and in 6 months. The patient is aware she will be put in call backs for this.

## 2011-11-26 NOTE — Patient Instructions (Addendum)
Your physician wants you to follow-up in: 6 months with Dr Swaziland and 12 months with Dr Court Joy will receive a reminder letter in the mail two months in advance. If you don't receive a letter, please call our office to schedule the follow-up appointment.  Your physician has recommended you make the following change in your medication:  1) Increase Prilosec to 20mg  twice daily

## 2011-11-27 ENCOUNTER — Encounter: Payer: Self-pay | Admitting: Internal Medicine

## 2011-11-27 NOTE — Assessment & Plan Note (Signed)
Her blood pressure is well controlled. Will continue her current meds and she will maintain a low sodium diet.

## 2011-11-27 NOTE — Progress Notes (Signed)
HPI [name] returns today for followup. She is a pleasant 71 yo woman with a h/o symptomatic bradycardia, s/p PPM, HTN, and DM. She denies c/p or sob. No palpitations. She notes that she has had problems with diarrhea and vomiting which have just resolved.  Allergies  Allergen Reactions  . Codeine Other (See Comments)    Nightmares.     Current Outpatient Prescriptions  Medication Sig Dispense Refill  . AMLODIPINE BESYLATE PO Take 2.5 mg by mouth daily.      Marland Kitchen aspirin 81 MG tablet Take 160 mg by mouth daily.      Marland Kitchen glimepiride (AMARYL) 2 MG tablet Take 2 mg by mouth daily before breakfast.      . hydrochlorothiazide (HYDRODIURIL) 25 MG tablet Take 25 mg by mouth daily.      Marland Kitchen omeprazole (PRILOSEC) 20 MG capsule Take 1 capsule (20 mg total) by mouth 2 (two) times daily.      . ondansetron (ZOFRAN) 4 MG tablet Take 1 tablet (4 mg total) by mouth every 6 (six) hours.  12 tablet  0  . potassium chloride SA (K-DUR,KLOR-CON) 20 MEQ tablet Take 1 tablet (20 mEq total) by mouth 2 (two) times daily.  10 tablet  0  . rosuvastatin (CRESTOR) 40 MG tablet Take 40 mg by mouth daily.         Past Medical History  Diagnosis Date  . DM (diabetes mellitus)   . Dyslipidemia   . Diverticulosis   . Internal and external hemorrhoids without complication     ROS:   All systems reviewed and negative except as noted in the HPI.   Past Surgical History  Procedure Date  . Right knee surgery     x2  . Neck surgery   . Abdominal hysterectomy   . Colonoscopy 01/01/2010    diverticulosis, internal and external hemorrhoids     Family History  Problem Relation Age of Onset  . Coronary artery disease Neg Hx   . Hypertension Neg Hx   . Diabetes Neg Hx   . Cancer Other      History   Social History  . Marital Status: Married    Spouse Name: N/A    Number of Children: N/A  . Years of Education: N/A   Occupational History  . Not on file.   Social History Main Topics  . Smoking  status: Never Smoker   . Smokeless tobacco: Not on file  . Alcohol Use: Not on file  . Drug Use: No  . Sexually Active: Not on file   Other Topics Concern  . Not on file   Social History Narrative  . No narrative on file     BP 118/60  Pulse 79  Ht 5' 5.5" (1.664 m)  Wt 77.111 kg (170 lb)  BMI 27.86 kg/m2  Physical Exam:  Well appearing woman, NAD HEENT: Unremarkable Neck:  No JVD, no thyromegally Lungs:  Clear with no wheezes, rales, or rhonchi. HEART:  Regular rate rhythm, no murmurs, no rubs, no clicks Abd:  soft, positive bowel sounds, no organomegally, no rebound, no guarding Ext:  2 plus pulses, no edema, no cyanosis, no clubbing Skin:  No rashes no nodules Neuro:  CN II through XII intact, motor grossly intact   DEVICE  Normal device function.  See PaceArt for details.   Assess/Plan:

## 2011-11-27 NOTE — Assessment & Plan Note (Signed)
Her device is working normally. Will plan to recheck in several months. 

## 2011-12-08 ENCOUNTER — Ambulatory Visit (INDEPENDENT_AMBULATORY_CARE_PROVIDER_SITE_OTHER): Payer: Medicare Other | Admitting: Internal Medicine

## 2011-12-08 ENCOUNTER — Ambulatory Visit: Payer: Medicare Other | Admitting: Internal Medicine

## 2011-12-08 ENCOUNTER — Encounter: Payer: Self-pay | Admitting: Internal Medicine

## 2011-12-08 VITALS — BP 126/68 | HR 64 | Ht 65.5 in | Wt 171.6 lb

## 2011-12-08 DIAGNOSIS — R1319 Other dysphagia: Secondary | ICD-10-CM

## 2011-12-08 DIAGNOSIS — K219 Gastro-esophageal reflux disease without esophagitis: Secondary | ICD-10-CM

## 2011-12-08 DIAGNOSIS — R1314 Dysphagia, pharyngoesophageal phase: Secondary | ICD-10-CM

## 2011-12-08 DIAGNOSIS — K589 Irritable bowel syndrome without diarrhea: Secondary | ICD-10-CM

## 2011-12-08 DIAGNOSIS — R109 Unspecified abdominal pain: Secondary | ICD-10-CM

## 2011-12-08 DIAGNOSIS — R32 Unspecified urinary incontinence: Secondary | ICD-10-CM

## 2011-12-08 DIAGNOSIS — R1310 Dysphagia, unspecified: Secondary | ICD-10-CM

## 2011-12-08 DIAGNOSIS — R1030 Lower abdominal pain, unspecified: Secondary | ICD-10-CM

## 2011-12-08 NOTE — Patient Instructions (Signed)
You have been scheduled for an Endoscopy with separate instructions given. You have been given a High Fiber Diet handout to read and follow.

## 2011-12-08 NOTE — Progress Notes (Signed)
Subjective:    Patient ID: [name], female    DOB: 1941-06-19, 71 y.o.   MRN: [id number]  HPI this lady is known to me from prior screening colonoscopy. She was seen in the emergency room recently with nausea vomiting and diarrhea, laboratory testing and a CT scan of the abdomen and pelvis were unremarkable. She was thought to have some type of infectious gastroenteritis. That has completely resolved. She does have some other problems.  Over the past 3 months she's noted increasing regurgitation, heartburn and reflux. There is also intermittent solid food dysphagia. There is a suprasternal sticking point. She does not report any bleeding, there is no weight loss. He has tried Prilosec 20 mg daily but has been ineffective.  She is also having alternating hard stools that are somewhat difficult to illuminate with more normal stools. Not necessarily a new problem but is recurrent.  She has fleeting lower abdominal cramps, there is also a problem with urge urinary incontinence, typically when she is asleep. She will awaken and have urge to urinate but does not make it to be toilet in time. She says Dr. Collins Scotland has evaluated her with urinalysis that is negative.  Daughter is in a nursing home with multiple complications of diabetes. As a chronically stressful situation for the patient.  Allergies  Allergen Reactions  . Codeine Other (See Comments)    Nightmares.   Outpatient Prescriptions Prior to Visit  Medication Sig Dispense Refill  . AMLODIPINE BESYLATE PO Take 2.5 mg by mouth daily.      Marland Kitchen aspirin 81 MG tablet Take 160 mg by mouth daily.      Marland Kitchen glimepiride (AMARYL) 2 MG tablet Take 2 mg by mouth daily before breakfast.      . hydrochlorothiazide (HYDRODIURIL) 25 MG tablet Take 25 mg by mouth daily.      Marland Kitchen omeprazole (PRILOSEC) 20 MG capsule Take 1 capsule (20 mg total) by mouth 2 (two) times daily.      . rosuvastatin (CRESTOR) 40 MG tablet Take 40 mg by mouth daily.      .  potassium chloride SA (K-DUR,KLOR-CON) 20 MEQ tablet Take 1 tablet (20 mEq total) by mouth 2 (two) times daily.  10 tablet  0   Past Medical History  Diagnosis Date  . DM (diabetes mellitus)   . Dyslipidemia   . Diverticulosis   . Internal and external hemorrhoids without complication   . Arthritis   . Fibromyalgia   . Hypertension   . Hyperlipidemia   . Cardiac arrhythmia     now has pacemaker  . Potassium deficiency    Past Surgical History  Procedure Date  . Knee surgery     right x 2  . Neck surgery   . Abdominal hysterectomy   . Colonoscopy 01/01/2010    diverticulosis, internal and external hemorrhoids  . Pacemaker insertion   . Appendectomy    History   Social History  . Marital Status: Married    Spouse Name: N/A    Number of Children: N/A  . Years of Education: N/A   Social History Main Topics  . Smoking status: Never Smoker   . Smokeless tobacco: Never Used  . Alcohol Use: No  . Drug Use: No  . Sexually Active: None   Other Topics Concern  . None   Social History Narrative  . None   Family History  Problem Relation Age of Onset  . Coronary artery disease Father   . Hypertension Neg  Hx   . Diabetes Daughter   . Prostate cancer Father   . Breast cancer Maternal Aunt   . Colon cancer Father   . Lung cancer Father   . Colon cancer Sister     x 2  . Colon cancer Brother   . Lung cancer Mother   . Colon polyps Sister   . Crohn's disease Sister   . Kidney disease Daughter   . Diabetes Father   . Diabetes Cousin     multiple  . Diabetes Maternal Aunt     multiple  . Diabetes Maternal Uncle     multiple  . Irritable bowel syndrome Sister         Review of Systems Somewhat diffusely positive, allergies, arthritis, fatigue, headaches, muscle pains, night sweats, insomnia, increased thirst sore throat, urinary leakage in frequency All other review of systems negative or as per history of present illness    Objective:   Physical  Exam General:  Well-developed, well-nourished and in no acute distress Eyes:  anicteric. Lungs: Clear to auscultation bilaterally. Heart:  S1S2, no rubs, murmurs, gallops. Abdomen:  soft, non-tender, no hepatosplenomegaly, hernia, or mass and BS+.  Extremities:   no edema Skin   no rash. Neuro:  A&O x 3.  Psych:  appropriate mood and  Affect.   Data Reviewed:  Emergency room note in January 2013. CT hasn't and pelvis with contrast was unremarkable. Lab Results  Component Value Date   WBC 5.8 11/21/2011   HGB 15.5* 11/21/2011   HCT 43.7 11/21/2011   MCV 84.2 11/21/2011   PLT 238 11/21/2011             Assessment & Plan:   1. Esophageal dysphagia   2. GERD (gastroesophageal reflux disease)   3. Lower abdominal pain   4. IBS (irritable bowel syndrome)   5. Urinary incontinence    She is having intermittent esophageal dysphagia symptoms with solids. Sounds like she has has chest reflux disease. Upper GI endoscopy with likely esophageal dilation as appropriate. We'll probably need a medication changes far as her PPIs concern after that. Risks benefits and indications of upper GI endoscopy are explained, also included esophageal dilation in that discussion. She understands and agrees to proceed.  She has lower abdominal pain and alternating bowel habits. I think she probably has IBS. A lower abdominal pain could be urinary in origin as well as she has this nocturnal urgent continence. I have suggested a high-fiber diet and/or fiber supplement. I also think she should ask Dr. Collins Scotland if a urology evaluation would make sense. I have explained that to her.

## 2011-12-14 ENCOUNTER — Encounter: Payer: Self-pay | Admitting: Internal Medicine

## 2011-12-14 ENCOUNTER — Ambulatory Visit (AMBULATORY_SURGERY_CENTER): Payer: Medicare Other | Admitting: Internal Medicine

## 2011-12-14 VITALS — BP 154/62 | HR 72 | Temp 96.8°F | Resp 15 | Ht 65.0 in | Wt 171.0 lb

## 2011-12-14 DIAGNOSIS — K222 Esophageal obstruction: Secondary | ICD-10-CM

## 2011-12-14 DIAGNOSIS — K219 Gastro-esophageal reflux disease without esophagitis: Secondary | ICD-10-CM

## 2011-12-14 DIAGNOSIS — R1314 Dysphagia, pharyngoesophageal phase: Secondary | ICD-10-CM

## 2011-12-14 DIAGNOSIS — K449 Diaphragmatic hernia without obstruction or gangrene: Secondary | ICD-10-CM

## 2011-12-14 LAB — GLUCOSE, CAPILLARY: Glucose-Capillary: 98 mg/dL (ref 70–99)

## 2011-12-14 MED ORDER — PANTOPRAZOLE SODIUM 40 MG PO TBEC: 40.0000 mg | DELAYED_RELEASE_TABLET | Freq: Every day | ORAL | Status: DC

## 2011-12-14 MED ORDER — SODIUM CHLORIDE 0.9 % IV SOLN: 500.0000 mL | INTRAVENOUS | Status: DC

## 2011-12-14 NOTE — Op Note (Signed)
[facility] [address] [place], [place]  [address]  ENDOSCOPY PROCEDURE REPORT  PATIENT:  [name], [name]  MR#:  [id number] BIRTHDATE:  1941/02/19, 71 yrs. old  GENDER:  female  ENDOSCOPIST:  Iva Boop, MD, [facility] ASSISTANT:  PROCEDURE DATE:  12/14/2011 PROCEDURE:  EGD, diagnostic, Maloney Dilation of the Esophagus ASA CLASS:  Class II INDICATIONS:  1) dysphagia  2) reflux symptoms despite therapy  MEDICATIONS:   These medications were titrated to patient response per physician's verbal order, Fentanyl 50 mcg IV, Versed 6 mg IV TOPICAL ANESTHETIC:  Cetacaine Spray  DESCRIPTION OF PROCEDURE:   After the risks benefits and alternatives of the procedure were thoroughly explained, informed consent was obtained.  The [id number]-H180 [id number] endoscope was introduced through the mouth and advanced to the second portion of the duodenum, without limitations.  The instrument was slowly withdrawn as the mucosa was carefully examined. <<PROCEDUREIMAGES>>  An esophageal ring was found at the gastroesophageal junction.  A hiatal hernia was found. It was 2 cm in size. 35-37 cm  The examination was otherwise normal.    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 RECOMMENDATIONS: 1) Post-dilation diet today 2) Stop omeprazole 20 mg bid and start pantoprazole 40 mg daily  3) Follow-up GI prn  Iva Boop, MD, [facility]  CC:  Herb Grays, MD and The Patient  n. Rosalie Doctor:   Iva Boop at 12/14/2011 02:19 PM  Evelina Dun, [id number]

## 2011-12-14 NOTE — Progress Notes (Signed)
Patient did not experience any of the following events: a burn prior to discharge; a fall within the facility; wrong site/side/patient/procedure/implant event; or a hospital transfer or hospital admission upon discharge from the facility. (G8907) Patient did not have preoperative order for IV antibiotic SSI prophylaxis. (G8918)  

## 2011-12-14 NOTE — Patient Instructions (Addendum)
Please see your procedure report and the other information provided. I did change the omeprazole to pantoprazole (generic Protonix) Iva Boop, MD, [facility] FOLLOW DISCHARGE INSTRUCTIONS (BLUE AND GREEN SHEETS).Marland Kitchen

## 2011-12-15 ENCOUNTER — Telehealth: Payer: Self-pay | Admitting: *Deleted

## 2011-12-15 NOTE — Telephone Encounter (Signed)
  Follow up Call-  Call back number 12/14/2011  Post procedure Call Back phone  # [phone]  Permission to leave phone message Yes     Patient questions:  Do you have a fever, pain , or abdominal swelling? no Pain Score  0 *  Have you tolerated food without any problems? yes  Have you been able to return to your normal activities? yes  Do you have any questions about your discharge instructions: Diet   no Medications  yes Follow up visit  no  Do you have questions or concerns about your Care? no  Actions: * If pain score is 4 or above: No action needed, pain <4.

## 2011-12-16 ENCOUNTER — Telehealth: Payer: Self-pay | Admitting: Gastroenterology

## 2011-12-16 NOTE — Telephone Encounter (Signed)
Message copied by Bernita Buffy on Wed Dec 16, 2011  4:28 PM ------      Message from: Stan Head E      Created: Wed Dec 16, 2011 12:02 PM      Regarding: ? alternative PPI       This lady told endoscopy RN that she wanted a cheaper PPI - I changed her from omeprazole 20 mg bid to pantoprazole 40 mg daily - it looked like it was covered on her formulary ok            Please call her and find out what costs of the omeprazole and pantoprazole are and also see if we can find out what is on her formulary

## 2011-12-16 NOTE — Telephone Encounter (Signed)
Called the patient to find out about formulary medications and the cost. Patient stated that the situation is resolved. Her insurance company filled generic pantoprazole and it only cost her $10 and she is ok with that.

## 2011-12-29 ENCOUNTER — Encounter: Payer: Self-pay | Admitting: Internal Medicine

## 2011-12-31 ENCOUNTER — Other Ambulatory Visit: Payer: Self-pay | Admitting: Family Medicine

## 2011-12-31 DIAGNOSIS — Z1231 Encounter for screening mammogram for malignant neoplasm of breast: Secondary | ICD-10-CM

## 2012-01-28 ENCOUNTER — Telehealth: Payer: Self-pay | Admitting: Internal Medicine

## 2012-01-28 NOTE — Telephone Encounter (Signed)
Spoke with patient and she states she is still having problems with reflux. She is taking Protonix 40 mg daily. She will try increasing it to BID and see if this helps. She will call next week with update on how she is doing.

## 2012-01-28 NOTE — Telephone Encounter (Signed)
Left a message for patient to call me. 

## 2012-02-01 ENCOUNTER — Telehealth: Payer: Self-pay | Admitting: Internal Medicine

## 2012-02-01 MED ORDER — PANTOPRAZOLE SODIUM 40 MG PO TBEC: 40.0000 mg | DELAYED_RELEASE_TABLET | Freq: Two times a day (BID) | ORAL | Status: DC

## 2012-02-01 NOTE — Telephone Encounter (Signed)
Spoked with patient and using the Protonix BID has helped her reflux. She is about out of her pills and wants to have an rx for BID since it is helping so much. EGD with dil on 12/14/11- esophageal ring. Is this okay?

## 2012-02-01 NOTE — Telephone Encounter (Signed)
yes

## 2012-02-01 NOTE — Telephone Encounter (Signed)
Rx sent to pharmacy as per Dr. Arlyce Dice. Patient notified.

## 2012-02-10 ENCOUNTER — Other Ambulatory Visit: Payer: Self-pay | Admitting: Family Medicine

## 2012-02-10 ENCOUNTER — Ambulatory Visit
Admission: RE | Admit: 2012-02-10 | Discharge: 2012-02-10 | Disposition: A | Discharge status: Home / Self Care | Payer: Medicare Other | Source: Ambulatory Visit | Attending: Family Medicine | Admitting: Family Medicine

## 2012-02-10 DIAGNOSIS — Z1231 Encounter for screening mammogram for malignant neoplasm of breast: Secondary | ICD-10-CM

## 2012-02-10 DIAGNOSIS — R928 Other abnormal and inconclusive findings on diagnostic imaging of breast: Secondary | ICD-10-CM

## 2012-02-11 ENCOUNTER — Ambulatory Visit
Admission: RE | Admit: 2012-02-11 | Discharge: 2012-02-11 | Disposition: A | Discharge status: Home / Self Care | Payer: Medicare Other | Source: Ambulatory Visit | Attending: Family Medicine | Admitting: Family Medicine

## 2012-02-11 DIAGNOSIS — R928 Other abnormal and inconclusive findings on diagnostic imaging of breast: Secondary | ICD-10-CM

## 2012-02-25 ENCOUNTER — Ambulatory Visit (INDEPENDENT_AMBULATORY_CARE_PROVIDER_SITE_OTHER): Payer: Medicare Other | Admitting: *Deleted

## 2012-02-25 ENCOUNTER — Encounter: Payer: Self-pay | Admitting: Internal Medicine

## 2012-02-25 DIAGNOSIS — I498 Other specified cardiac arrhythmias: Secondary | ICD-10-CM

## 2012-02-26 DIAGNOSIS — M545 Low back pain, unspecified: Secondary | ICD-10-CM | POA: Insufficient documentation

## 2012-02-26 DIAGNOSIS — R32 Unspecified urinary incontinence: Secondary | ICD-10-CM | POA: Insufficient documentation

## 2012-02-29 LAB — REMOTE PACEMAKER DEVICE
AL IMPEDENCE PM: 473 Ohm
ATRIAL PACING PM: 99
BAMS-0001: 175 {beats}/min
BATTERY VOLTAGE: 2.76 V
BRDY-0002RV: 60 {beats}/min
BRDY-0004RV: 130 {beats}/min
RV LEAD AMPLITUDE: 22.4 mv
RV LEAD IMPEDENCE PM: 567 Ohm
VENTRICULAR PACING PM: 0

## 2012-03-01 NOTE — Progress Notes (Signed)
Remote pacer check  

## 2012-03-16 ENCOUNTER — Other Ambulatory Visit: Payer: Self-pay | Admitting: Neurosurgery

## 2012-03-16 DIAGNOSIS — M549 Dorsalgia, unspecified: Secondary | ICD-10-CM

## 2012-03-18 ENCOUNTER — Encounter: Payer: Self-pay | Admitting: *Deleted

## 2012-03-23 ENCOUNTER — Other Ambulatory Visit: Payer: Self-pay | Admitting: Gastroenterology

## 2012-03-23 NOTE — Telephone Encounter (Signed)
This is Dr. Gessner's patient 

## 2012-03-24 ENCOUNTER — Other Ambulatory Visit: Payer: Medicare Other

## 2012-05-20 ENCOUNTER — Other Ambulatory Visit: Payer: Self-pay | Admitting: Internal Medicine

## 2012-06-02 ENCOUNTER — Ambulatory Visit (INDEPENDENT_AMBULATORY_CARE_PROVIDER_SITE_OTHER): Payer: Medicare Other | Admitting: *Deleted

## 2012-06-02 DIAGNOSIS — Z950 Presence of cardiac pacemaker: Secondary | ICD-10-CM

## 2012-06-02 DIAGNOSIS — I498 Other specified cardiac arrhythmias: Secondary | ICD-10-CM

## 2012-06-03 ENCOUNTER — Encounter: Payer: Self-pay | Admitting: Internal Medicine

## 2012-06-07 LAB — REMOTE PACEMAKER DEVICE
AL IMPEDENCE PM: 510 Ohm
AL THRESHOLD: 0.875 V
ATRIAL PACING PM: 99
BAMS-0001: 175 {beats}/min
BATTERY VOLTAGE: 2.76 V
RV LEAD AMPLITUDE: 22.4 mv
RV LEAD IMPEDENCE PM: 621 Ohm
RV LEAD THRESHOLD: 0.625 V
VENTRICULAR PACING PM: 0

## 2012-06-16 ENCOUNTER — Encounter: Payer: Self-pay | Admitting: *Deleted

## 2012-07-12 ENCOUNTER — Encounter: Payer: Self-pay | Admitting: Cardiology

## 2012-07-13 ENCOUNTER — Encounter: Payer: Self-pay | Admitting: Cardiology

## 2012-07-19 ENCOUNTER — Other Ambulatory Visit: Payer: Self-pay | Admitting: Internal Medicine

## 2012-08-26 ENCOUNTER — Encounter: Payer: Self-pay | Admitting: Internal Medicine

## 2012-09-05 ENCOUNTER — Ambulatory Visit (INDEPENDENT_AMBULATORY_CARE_PROVIDER_SITE_OTHER): Payer: Medicare Other | Admitting: *Deleted

## 2012-09-05 DIAGNOSIS — I498 Other specified cardiac arrhythmias: Secondary | ICD-10-CM

## 2012-09-05 DIAGNOSIS — Z950 Presence of cardiac pacemaker: Secondary | ICD-10-CM

## 2012-09-06 ENCOUNTER — Encounter: Payer: Self-pay | Admitting: Internal Medicine

## 2012-09-07 ENCOUNTER — Telehealth: Payer: Self-pay | Admitting: Internal Medicine

## 2012-09-07 NOTE — Telephone Encounter (Signed)
Patient called to schedule her recall colon.  She reports continued dysphagia.  She did have an EGD with Dil in March.  She is requesting another dilation.  She reports that 2 -3 times a week she has "regurtitating my food after eating".  Dr. Leone Payor ok for a double?

## 2012-09-07 NOTE — Telephone Encounter (Signed)
Discussed with Park Liter, she will contact the patient to help schedule the appt

## 2012-09-07 NOTE — Telephone Encounter (Signed)
Ok for egd/dil and colon together

## 2012-09-08 NOTE — Telephone Encounter (Signed)
Called patient and l/m to c/b and schedule her procedure

## 2012-09-13 LAB — REMOTE PACEMAKER DEVICE
AL IMPEDENCE PM: 467 Ohm
AL THRESHOLD: 0.875 V
ATRIAL PACING PM: 99
BAMS-0001: 175 {beats}/min
BATTERY VOLTAGE: 2.75 V
RV LEAD AMPLITUDE: 22.4 mv
RV LEAD IMPEDENCE PM: 617 Ohm
RV LEAD THRESHOLD: 0.625 V
VENTRICULAR PACING PM: 0

## 2012-10-12 ENCOUNTER — Encounter: Payer: Self-pay | Admitting: *Deleted

## 2012-11-29 ENCOUNTER — Ambulatory Visit (INDEPENDENT_AMBULATORY_CARE_PROVIDER_SITE_OTHER): Payer: Medicare Other | Admitting: Internal Medicine

## 2012-11-29 ENCOUNTER — Encounter: Payer: Self-pay | Admitting: Internal Medicine

## 2012-11-29 VITALS — BP 140/86 | HR 84 | Ht 60.5 in | Wt 173.0 lb

## 2012-11-29 DIAGNOSIS — I498 Other specified cardiac arrhythmias: Secondary | ICD-10-CM

## 2012-11-29 DIAGNOSIS — M791 Myalgia, unspecified site: Secondary | ICD-10-CM | POA: Insufficient documentation

## 2012-11-29 DIAGNOSIS — I10 Essential (primary) hypertension: Secondary | ICD-10-CM

## 2012-11-29 DIAGNOSIS — Z950 Presence of cardiac pacemaker: Secondary | ICD-10-CM

## 2012-11-29 DIAGNOSIS — IMO0001 Reserved for inherently not codable concepts without codable children: Secondary | ICD-10-CM

## 2012-11-29 LAB — PACEMAKER DEVICE OBSERVATION
AL IMPEDENCE PM: 467 Ohm
AL THRESHOLD: 0.5 V
ATRIAL PACING PM: 99
BAMS-0001: 175 {beats}/min
BATTERY VOLTAGE: 2.75 V
RV LEAD AMPLITUDE: 31.36 mv
RV LEAD IMPEDENCE PM: 590 Ohm
RV LEAD THRESHOLD: 0.75 V
VENTRICULAR PACING PM: 0

## 2012-11-29 LAB — CK: Total CK: 233 U/L — ABNORMAL HIGH (ref 7–177)

## 2012-11-29 LAB — SEDIMENTATION RATE: Sed Rate: 22 mm/hr (ref 0–22)

## 2012-11-29 NOTE — Assessment & Plan Note (Signed)
Her blood pressure is minimally elevated today. She will continue her current medical therapy. I've asked her to maintain a low-sodium diet.

## 2012-11-29 NOTE — Assessment & Plan Note (Signed)
Her device is working normally. We'll plan to recheck in several months. She has approximately 3 years of battery longevity.

## 2012-11-29 NOTE — Patient Instructions (Signed)
Your physician wants you to follow-up in: 12 months with Dr Court Joy will receive a reminder letter in the mail two months in advance. If you don't receive a letter, please call our office to schedule the follow-up appointment.    Remote monitoring is used to monitor your Pacemaker of ICD from home. This monitoring reduces the number of office visits required to check your device to one time per year. It allows [place] to keep an eye on the functioning of your device to ensure it is working properly. You are scheduled for a device check from home on 02/27/13 You may send your transmission at any time that day. If you have a wireless device, the transmission will be sent automatically. After your physician reviews your transmission, you will receive a postcard with your next transmission date.   Your physician recommends that you return for lab work today sed rate/CK

## 2012-11-29 NOTE — Assessment & Plan Note (Signed)
The etiology is unclear. We'll check a sedimentation rate and CK.

## 2012-11-29 NOTE — Progress Notes (Signed)
HPI [name] returns today for followup. She is a very pleasant 72 year old woman with hypertension and sinus node dysfunction, status post permanent pacemaker insertion. She has done well in the interim except that she has aches and pains in her legs and on the left side of her chest. She denies shortness of breath or syncope. At nighttime, she has minimal peripheral edema. Her muscle aches are not related to exertion. They appear to be worse at night, particularly when she is sleeping or trying to sleep. Allergies  Allergen Reactions  . Codeine Other (See Comments)    Nightmares.     Current Outpatient Prescriptions  Medication Sig Dispense Refill  . AMLODIPINE BESYLATE PO Take 2.5 mg by mouth daily.      Marland Kitchen aspirin 81 MG tablet Take 160 mg by mouth daily.      Marland Kitchen atorvastatin (LIPITOR) 40 MG tablet       . glimepiride (AMARYL) 1 MG tablet Take 1 mg by mouth 3 (three) times daily.      . hydrochlorothiazide (HYDRODIURIL) 25 MG tablet Take 25 mg by mouth daily.      Marland Kitchen lisinopril (PRINIVIL,ZESTRIL) 40 MG tablet Take 40 mg by mouth daily.      . pantoprazole (PROTONIX) 40 MG tablet TAKE 1 TABLET BY MOUTH TWICE A DAY 30 MINUTES BEFORE BREAKFAST  60 tablet  6     Past Medical History  Diagnosis Date  . DM (diabetes mellitus)   . Dyslipidemia   . Diverticulosis   . Internal and external hemorrhoids without complication   . Arthritis   . Fibromyalgia   . Hypertension   . Hyperlipidemia   . Cardiac arrhythmia     now has pacemaker  . Potassium deficiency     ROS:   All systems reviewed and negative except as noted in the HPI.   Past Surgical History  Procedure Date  . Knee surgery     right x 2  . Neck surgery   . Abdominal hysterectomy   . Colonoscopy 01/01/2010    diverticulosis, internal and external hemorrhoids  . Pacemaker insertion   . Appendectomy   . Upper gastrointestinal endoscopy      Family History  Problem Relation Age of Onset  . Coronary artery  disease Father   . Hypertension Neg Hx   . Diabetes Daughter   . Prostate cancer Father   . Breast cancer Maternal Aunt   . Colon cancer Father   . Lung cancer Father   . Colon cancer Sister     x 2  . Colon cancer Brother   . Lung cancer Mother   . Colon polyps Sister   . Crohn's disease Sister   . Kidney disease Daughter   . Diabetes Father   . Diabetes Cousin     multiple  . Diabetes Maternal Aunt     multiple  . Diabetes Maternal Uncle     multiple  . Irritable bowel syndrome Sister      History   Social History  . Marital Status: Married    Spouse Name: N/A    Number of Children: N/A  . Years of Education: N/A   Occupational History  . Not on file.   Social History Main Topics  . Smoking status: Never Smoker   . Smokeless tobacco: Never Used  . Alcohol Use: No  . Drug Use: No  . Sexually Active: Not on file   Other Topics Concern  . Not on file  Social History Narrative  . No narrative on file     BP 140/86  Pulse 84  Ht 5' 0.5" (1.537 m)  Wt 173 lb (78.472 kg)  BMI 33.23 kg/m2  Physical Exam:  Well appearing 72 year old woman, NAD HEENT: Unremarkable Neck:  No JVD, no thyromegally Lungs:  Clear with no wheezes, rales, or rhonchi. HEART:  Regular rate rhythm, no murmurs, no rubs, no clicks Abd:  soft, positive bowel sounds, no organomegally, no rebound, no guarding Ext:  2 plus pulses, no edema, no cyanosis, no clubbing Skin:  No rashes no nodules Neuro:  CN II through XII intact, motor grossly intact  EKG Normal sinus rhythm with atrial pacing and nonspecific ST-T wave abnormality. DEVICE  Normal device function.  See PaceArt for details.   Assess/Plan:

## 2012-12-09 ENCOUNTER — Ambulatory Visit (INDEPENDENT_AMBULATORY_CARE_PROVIDER_SITE_OTHER): Payer: Medicare Other | Admitting: Internal Medicine

## 2012-12-09 ENCOUNTER — Encounter: Payer: Self-pay | Admitting: Internal Medicine

## 2012-12-09 VITALS — BP 118/68 | HR 68 | Ht 65.0 in | Wt 170.1 lb

## 2012-12-09 DIAGNOSIS — K219 Gastro-esophageal reflux disease without esophagitis: Secondary | ICD-10-CM

## 2012-12-09 DIAGNOSIS — Z800 Family history of malignant neoplasm of digestive organs: Secondary | ICD-10-CM

## 2012-12-09 DIAGNOSIS — Z1211 Encounter for screening for malignant neoplasm of colon: Secondary | ICD-10-CM

## 2012-12-09 MED ORDER — NA SULFATE-K SULFATE-MG SULF 17.5-3.13-1.6 GM/177ML PO SOLN: ORAL | Status: DC

## 2012-12-09 MED ORDER — METOCLOPRAMIDE HCL 5 MG PO TABS: 5.0000 mg | ORAL_TABLET | Freq: Every evening | ORAL | Status: DC | PRN

## 2012-12-09 NOTE — Progress Notes (Signed)
  Subjective:    Patient ID: [name], female    DOB: 1941-10-24, 72 y.o.   MRN: [id number]  HPI The patient is here for follow-up of GERD and family hx of colorectal cancer. She does not have dysphagia after dilation of ring last year but had frequent nocturnal regurgitation. She eats at about 5 PM and is in bed at 7 or 8 PM. Will awaken with regurgitation and choking a few hours later. She does not have head of bed raised. She does take pantoprazole 40 mg bid as prescribed.  Medications, allergies, past medical history, past surgical history, family history and social history are reviewed and updated in the EMR.   Review of Systems As above    Objective:   Physical Exam WDWN NAD       Assessment & Plan:   1. GERD (gastroesophageal reflux disease)    2. Special screening for malignant neoplasms, colon  Ambulatory referral to Gastroenterology  3. Family history of malignant neoplasm of gastrointestinal tract  Ambulatory referral to Gastroenterology   1. Try to raise head of bed and give at least 3 hrs before retiring and supine after supper 2. Metaclopramide 5 mg at bedtime prn. Side effects of neuropathy and tardive dyskinesia reviewed. 3. Schedule screening colonoscopy - she has a very strong FHx of colon cancer  WU:[id number], TAMMY, MD

## 2012-12-09 NOTE — Patient Instructions (Addendum)
You have been scheduled for a colonoscopy with propofol. Please follow written instructions given to you at your visit today.  Please use the suprep kit we gave you today. If you use inhalers (even only as needed) or a CPAP machine, please bring them with you on the day of your procedure.  Read and follow the Heartburn/Reflux handout you have been given today.  We have sent the following medications to your pharmacy for you to pick up at your convenience: Generic reglan to use as needed at bedtime.  Thank you for choosing me and [facility].  Iva Boop, M.D., [facility]

## 2012-12-26 ENCOUNTER — Telehealth: Payer: Self-pay | Admitting: Internal Medicine

## 2012-12-26 NOTE — Telephone Encounter (Signed)
Will need to discuss with Dr Ladona Ridgel in regards to how she is feeling.  Her legs are still hurting just not as bad.  Let her know I will discuss with Dr Ladona Ridgel and call her back later this week in regards to having labs rechecked

## 2012-12-26 NOTE — Telephone Encounter (Signed)
New Problem     Pt would like to know if its time to recheck her blood work or if she is supposed to see Dr. Ladona Ridgel first. Would like to speak to nurse.

## 2013-01-03 ENCOUNTER — Encounter: Payer: Medicare Other | Admitting: Internal Medicine

## 2013-01-03 DIAGNOSIS — M4800 Spinal stenosis, site unspecified: Secondary | ICD-10-CM | POA: Insufficient documentation

## 2013-01-03 NOTE — Telephone Encounter (Signed)
Discussed with Dr Ladona Ridgel, do not start back.  No need for repeat labs

## 2013-01-09 ENCOUNTER — Other Ambulatory Visit: Payer: Self-pay

## 2013-01-09 DIAGNOSIS — Z1231 Encounter for screening mammogram for malignant neoplasm of breast: Secondary | ICD-10-CM

## 2013-01-20 NOTE — Telephone Encounter (Signed)
i have tried to reach patient several times without success

## 2013-01-24 NOTE — Telephone Encounter (Signed)
Patient aware no need to repeat at present

## 2013-01-26 ENCOUNTER — Other Ambulatory Visit: Payer: Self-pay | Admitting: Internal Medicine

## 2013-01-26 ENCOUNTER — Encounter: Payer: Self-pay | Admitting: Internal Medicine

## 2013-01-26 ENCOUNTER — Ambulatory Visit (AMBULATORY_SURGERY_CENTER): Payer: Medicare Other | Admitting: Internal Medicine

## 2013-01-26 VITALS — BP 139/72 | HR 60 | Temp 97.6°F | Resp 17 | Ht 65.0 in | Wt 170.0 lb

## 2013-01-26 DIAGNOSIS — Z800 Family history of malignant neoplasm of digestive organs: Secondary | ICD-10-CM

## 2013-01-26 DIAGNOSIS — Z1211 Encounter for screening for malignant neoplasm of colon: Secondary | ICD-10-CM

## 2013-01-26 LAB — GLUCOSE, CAPILLARY
Glucose-Capillary: 108 mg/dL — ABNORMAL HIGH (ref 70–99)
Glucose-Capillary: 91 mg/dL (ref 70–99)

## 2013-01-26 MED ORDER — SODIUM CHLORIDE 0.9 % IV SOLN: 500.0000 mL | INTRAVENOUS | Status: DC

## 2013-01-26 NOTE — Progress Notes (Addendum)
Patient did not have preoperative order for IV antibiotic SSI prophylaxis. (G8918)  Patient did not experience any of the following events: a burn prior to discharge; a fall within the facility; wrong site/side/patient/procedure/implant event; or a hospital transfer or hospital admission upon discharge from the facility. (G8907)  

## 2013-01-26 NOTE — Op Note (Addendum)
[facility] [address] [place], [address]   COLONOSCOPY PROCEDURE REPORT  PATIENT: [name], [name]  MR#: [id number] BIRTHDATE: [birth year] , 72  yrs. old GENDER: Female ENDOSCOPIST: Iva Boop, MD, [facility] PROCEDURE DATE:  01/26/2013 PROCEDURE:   Colonoscopy, screening ASA CLASS:   Class II INDICATIONS:Patient's immediate family history of colon cancer.   2 siblings, father and aunt MEDICATIONS: propofol (Diprivan) 200mg  IV, MAC sedation, administered by CRNA, and These medications were titrated to patient response per physician's verbal order  DESCRIPTION OF PROCEDURE:   After the risks benefits and alternatives of the procedure were thoroughly explained, informed consent was obtained.  A digital rectal exam revealed no abnormalities of the rectum.   The LB [id number]-H180AL [id number]  endoscope was introduced through the anus and advanced to the cecum, which was identified by both the appendix and ileocecal valve. No adverse events experienced.   The quality of the prep was Suprep excellent The instrument was then slowly withdrawn as the colon was fully examined.      COLON FINDINGS: Moderate diverticulosis was noted in the sigmoid colon.   The colon mucosa was otherwise normal.   A right colon retroflexion was performed.  Retroflexed views revealed no abnormalities. The time to cecum=3 minutes 10 seconds.  Withdrawal time=72minutes 1 seconds.  The scope was withdrawn and the procedure completed. COMPLICATIONS: There were no complications.  ENDOSCOPIC IMPRESSION: Moderate diverticulosis was noted in the sigmoid colon, otherwise normal w/ excellent prep  RECOMMENDATIONS: Repeat Colonoscopy in 5 years.   eSigned:  Iva Boop, MD, [facility] 01/26/2013 11:36 AM Revised: 01/26/2013 11:36 AM  cc: The Patient and Herb Grays, MD

## 2013-01-26 NOTE — Patient Instructions (Addendum)
The colonoscopy demonstrated diverticulosis but no other abnormalities found.  Next routine colonoscopy in 5 years i think.  I do not think you have gotten genetic testing to see what your cancer risk is - so if you want we can arrange that.  Thank you for choosing me and [facility].  Iva Boop, MD, FACG  YOU HAD AN ENDOSCOPIC PROCEDURE TODAY AT THE [facility]: Refer to the procedure report that was given to you for any specific questions about what was found during the examination.  If the procedure report does not answer your questions, please call your gastroenterologist to clarify.  If you requested that your care partner not be given the details of your procedure findings, then the procedure report has been included in a sealed envelope for you to review at your convenience later.  YOU SHOULD EXPECT: Some feelings of bloating in the abdomen. Passage of more gas than usual.  Walking can help get rid of the air that was put into your GI tract during the procedure and reduce the bloating. If you had a lower endoscopy (such as a colonoscopy or flexible sigmoidoscopy) you may notice spotting of blood in your stool or on the toilet paper. If you underwent a bowel prep for your procedure, then you may not have a normal bowel movement for a few days.  DIET: Your first meal following the procedure should be a light meal and then it is ok to progress to your normal diet.  A half-sandwich or bowl of soup is an example of a good first meal.  Heavy or fried foods are harder to digest and may make you feel nauseous or bloated.  Likewise meals heavy in dairy and vegetables can cause extra gas to form and this can also increase the bloating.  Drink plenty of fluids but you should avoid alcoholic beverages for 24 hours.  ACTIVITY: Your care partner should take you home directly after the procedure.  You should plan to take it easy, moving slowly for the rest of the day.  You can  resume normal activity the day after the procedure however you should NOT DRIVE or use heavy machinery for 24 hours (because of the sedation medicines used during the test).    SYMPTOMS TO REPORT IMMEDIATELY: A gastroenterologist can be reached at any hour.  During normal business hours, 8:30 AM to 5:00 PM Monday through Friday, call [phone].  After hours and on weekends, please call the GI answering service at [phone] who will take a message and have the physician on call contact you.   Following lower endoscopy (colonoscopy or flexible sigmoidoscopy):  Excessive amounts of blood in the stool  Significant tenderness or worsening of abdominal pains  Swelling of the abdomen that is new, acute  Fever of 100F or higher  FOLLOW UP: If any biopsies were taken you will be contacted by phone or by letter within the next 1-3 weeks.  Call your gastroenterologist if you have not heard about the biopsies in 3 weeks.  Our staff will call the home number listed on your records the next business day following your procedure to check on you and address any questions or concerns that you may have at that time regarding the information given to you following your procedure. This is a courtesy call and so if there is no answer at the home number and we have not heard from you through the emergency physician on call, we will assume that  you have returned to your regular daily activities without incident.  SIGNATURES/CONFIDENTIALITY: You and/or your care partner have signed paperwork which will be entered into your electronic medical record.  These signatures attest to the fact that that the information above on your After Visit Summary has been reviewed and is understood.  Full responsibility of the confidentiality of this discharge information lies with you and/or your care-partner.

## 2013-01-27 ENCOUNTER — Telehealth: Payer: Self-pay

## 2013-01-27 NOTE — Telephone Encounter (Signed)
  Follow up Call-  Call back number 01/26/2013 12/14/2011  Post procedure Call Back phone  # [phone] cell [phone]  Permission to leave phone message Yes Yes     Patient questions:  Do you have a fever, pain , or abdominal swelling? no Pain Score  0 *  Have you tolerated food without any problems? yes  Have you been able to return to your normal activities? yes  Do you have any questions about your discharge instructions: Diet   no Medications  no Follow up visit  no  Do you have questions or concerns about your Care? no  Actions: * If pain score is 4 or above: No action needed, pain <4.

## 2013-02-09 ENCOUNTER — Other Ambulatory Visit (HOSPITAL_COMMUNITY): Payer: Self-pay | Admitting: Obstetrics and Gynecology

## 2013-02-09 DIAGNOSIS — K5732 Diverticulitis of large intestine without perforation or abscess without bleeding: Secondary | ICD-10-CM

## 2013-02-09 DIAGNOSIS — N950 Postmenopausal bleeding: Secondary | ICD-10-CM

## 2013-02-09 DIAGNOSIS — N939 Abnormal uterine and vaginal bleeding, unspecified: Secondary | ICD-10-CM

## 2013-02-14 ENCOUNTER — Ambulatory Visit
Admission: RE | Admit: 2013-02-14 | Discharge: 2013-02-14 | Disposition: A | Discharge status: Home / Self Care | Payer: Medicare Other | Source: Ambulatory Visit

## 2013-02-14 DIAGNOSIS — Z1231 Encounter for screening mammogram for malignant neoplasm of breast: Secondary | ICD-10-CM

## 2013-02-16 ENCOUNTER — Inpatient Hospital Stay (HOSPITAL_COMMUNITY): Payer: Medicare Other

## 2013-02-16 ENCOUNTER — Inpatient Hospital Stay (HOSPITAL_COMMUNITY)
Admission: AD | Admit: 2013-02-16 | Discharge: 2013-02-16 | Disposition: A | Discharge status: Home / Self Care | Payer: Medicare Other | Source: Ambulatory Visit | Attending: Emergency Medicine | Admitting: Emergency Medicine

## 2013-02-16 ENCOUNTER — Ambulatory Visit (HOSPITAL_COMMUNITY): Admission: RE | Admit: 2013-02-16 | Payer: Medicare Other | Source: Ambulatory Visit

## 2013-02-16 ENCOUNTER — Encounter (HOSPITAL_COMMUNITY): Payer: Self-pay | Admitting: *Deleted

## 2013-02-16 DIAGNOSIS — M47817 Spondylosis without myelopathy or radiculopathy, lumbosacral region: Secondary | ICD-10-CM | POA: Insufficient documentation

## 2013-02-16 DIAGNOSIS — R110 Nausea: Secondary | ICD-10-CM | POA: Insufficient documentation

## 2013-02-16 DIAGNOSIS — S161XXA Strain of muscle, fascia and tendon at neck level, initial encounter: Secondary | ICD-10-CM

## 2013-02-16 DIAGNOSIS — E119 Type 2 diabetes mellitus without complications: Secondary | ICD-10-CM | POA: Insufficient documentation

## 2013-02-16 DIAGNOSIS — K449 Diaphragmatic hernia without obstruction or gangrene: Secondary | ICD-10-CM | POA: Insufficient documentation

## 2013-02-16 DIAGNOSIS — I700 Atherosclerosis of aorta: Secondary | ICD-10-CM | POA: Insufficient documentation

## 2013-02-16 DIAGNOSIS — Z90710 Acquired absence of both cervix and uterus: Secondary | ICD-10-CM | POA: Insufficient documentation

## 2013-02-16 DIAGNOSIS — N938 Other specified abnormal uterine and vaginal bleeding: Secondary | ICD-10-CM | POA: Insufficient documentation

## 2013-02-16 DIAGNOSIS — K7689 Other specified diseases of liver: Secondary | ICD-10-CM | POA: Insufficient documentation

## 2013-02-16 DIAGNOSIS — N949 Unspecified condition associated with female genital organs and menstrual cycle: Secondary | ICD-10-CM | POA: Insufficient documentation

## 2013-02-16 DIAGNOSIS — M542 Cervicalgia: Secondary | ICD-10-CM | POA: Insufficient documentation

## 2013-02-16 DIAGNOSIS — I10 Essential (primary) hypertension: Secondary | ICD-10-CM | POA: Insufficient documentation

## 2013-02-16 DIAGNOSIS — K5730 Diverticulosis of large intestine without perforation or abscess without bleeding: Secondary | ICD-10-CM | POA: Insufficient documentation

## 2013-02-16 DIAGNOSIS — N925 Other specified irregular menstruation: Secondary | ICD-10-CM | POA: Insufficient documentation

## 2013-02-16 DIAGNOSIS — R109 Unspecified abdominal pain: Secondary | ICD-10-CM | POA: Insufficient documentation

## 2013-02-16 LAB — POCT I-STAT, CHEM 8
BUN: 12 mg/dL (ref 6–23)
Calcium, Ion: 1.03 mmol/L — ABNORMAL LOW (ref 1.13–1.30)
Chloride: 108 mEq/L (ref 96–112)
Creatinine, Ser: 0.6 mg/dL (ref 0.50–1.10)
Glucose, Bld: 116 mg/dL — ABNORMAL HIGH (ref 70–99)
HCT: 38 % (ref 36.0–46.0)
Hemoglobin: 12.9 g/dL (ref 12.0–15.0)
Potassium: 3.3 mEq/L — ABNORMAL LOW (ref 3.5–5.1)
Sodium: 142 mEq/L (ref 135–145)
TCO2: 23 mmol/L (ref 0–100)

## 2013-02-16 MED ORDER — HYDROMORPHONE HCL PF 2 MG/ML IJ SOLN
2.0000 mg | Freq: Once | INTRAMUSCULAR | Status: AC
  Administered 2013-02-16: 2 mg via INTRAVENOUS
  Filled 2013-02-16: qty 1

## 2013-02-16 MED ORDER — ONDANSETRON HCL 4 MG/2ML IJ SOLN
INTRAMUSCULAR | Status: AC
  Administered 2013-02-16: 4 mg via INTRAVENOUS
  Filled 2013-02-16: qty 2

## 2013-02-16 MED ORDER — IOHEXOL 300 MG/ML  SOLN
100.0000 mL | Freq: Once | INTRAMUSCULAR | Status: AC | PRN
  Administered 2013-02-16: 100 mL via INTRAVENOUS

## 2013-02-16 MED ORDER — HYDROCODONE-ACETAMINOPHEN 5-325 MG PO TABS: 2.0000 | ORAL_TABLET | ORAL | Status: DC | PRN

## 2013-02-16 MED ORDER — IBUPROFEN 400 MG PO TABS: 400.0000 mg | ORAL_TABLET | Freq: Four times a day (QID) | ORAL | Status: DC | PRN

## 2013-02-16 MED ORDER — KETOROLAC TROMETHAMINE 30 MG/ML IJ SOLN
15.0000 mg | Freq: Once | INTRAMUSCULAR | Status: AC
  Administered 2013-02-16: 15 mg via INTRAVENOUS
  Filled 2013-02-16: qty 1

## 2013-02-16 MED ORDER — ONDANSETRON HCL 4 MG/2ML IJ SOLN: 4.0000 mg | Freq: Once | INTRAMUSCULAR | Status: AC

## 2013-02-16 MED ORDER — SODIUM CHLORIDE 0.9 % IV SOLN
INTRAVENOUS | Status: DC
  Administered 2013-02-16: 09:00:00 via INTRAVENOUS

## 2013-02-16 MED ORDER — METHOCARBAMOL 500 MG PO TABS: 500.0000 mg | ORAL_TABLET | Freq: Two times a day (BID) | ORAL | Status: DC

## 2013-02-16 NOTE — ED Notes (Signed)
[id number]<JY> Expected date:<BR> Expected time:<BR> Means of arrival:<BR> Comments:<BR> Transfer from [facility] [place] 72yo c/o pain all over after drinking contrast for a CT of pelvic

## 2013-02-16 NOTE — MAU Note (Signed)
Pt scheduled for CT here today, had hysterectomy years ago, has been having bleeding, CT scheduled by Dr. Henderson Cloud.  Pt states she drank contrast @ 1930 last night, work up @ 0245 with severe neck pain that shoots up into her head & then all over.  Pt drank rest of contrast @ 0715.  Pt denies abd pain or vag bleeding.

## 2013-02-16 NOTE — MAU Provider Note (Signed)
History     CSN: [id number]  Arrival date and time: 02/16/13 4098   First Provider Initiated Contact with Patient 02/16/13 [phone]      Chief Complaint  Patient presents with  . Neck Pain   HPI  Pt is a 72 yo here with report of sudden onset of shooting pains in neck that started around 0300 this morning.  Pain is rated a 10/10 and has impacted mobility of neck.  Hx of previous neck surgery.  Pt concerned because she was taking contrast for a CT for evaluation of abnormal vaginal bleeding.  No report of shortness of breath, rash, or chest pain.    Past Medical History  Diagnosis Date  . DM (diabetes mellitus)   . Dyslipidemia   . Diverticulosis   . Internal and external hemorrhoids without complication   . Arthritis   . Hypertension   . Hyperlipidemia   . Cardiac arrhythmia     now has pacemaker  . Potassium deficiency   . Fibromyalgia   . Allergy   . Cataract   . GERD (gastroesophageal reflux disease)   . Myocardial infarction     Past Surgical History  Procedure Laterality Date  . Knee surgery      right x 2  . Neck surgery    . Abdominal hysterectomy    . Colonoscopy  01/01/2010    diverticulosis, internal and external hemorrhoids  . Pacemaker insertion    . Appendectomy    . Upper gastrointestinal endoscopy    . Bil carple tunnel surgery      Family History  Problem Relation Age of Onset  . Coronary artery disease Father   . Prostate cancer Father   . Colon cancer Father   . Lung cancer Father   . Diabetes Father   . Hypertension Neg Hx   . Esophageal cancer Neg Hx   . Rectal cancer Neg Hx   . Stomach cancer Neg Hx   . Diabetes Daughter   . Breast cancer Maternal Aunt   . Colon cancer Sister     x 2  . Colon cancer Brother   . Lung cancer Mother   . Colon polyps Sister   . Crohn's disease Sister   . Kidney disease Daughter   . Diabetes Cousin     multiple  . Diabetes Maternal Aunt     multiple  . Diabetes Maternal Uncle     multiple  .  Irritable bowel syndrome Sister     History  Substance Use Topics  . Smoking status: Never Smoker   . Smokeless tobacco: Never Used  . Alcohol Use: No    Allergies:  Allergies  Allergen Reactions  . Codeine Other (See Comments)    Nightmares.  . Metformin And Related     Causes dehydration and causes me to go to the hospital    Prescriptions prior to admission  Medication Sig Dispense Refill  . AMLODIPINE BESYLATE PO Take 2.5 mg by mouth daily.      Marland Kitchen aspirin 81 MG tablet Take 81 mg by mouth daily.       Marland Kitchen glimepiride (AMARYL) 1 MG tablet Take 1 mg by mouth 3 (three) times daily.      . hydrochlorothiazide (HYDRODIURIL) 25 MG tablet Take 25 mg by mouth daily.      Marland Kitchen lisinopril (PRINIVIL,ZESTRIL) 40 MG tablet Take 40 mg by mouth daily.      . metoCLOPramide (REGLAN) 5 MG tablet Take 1 tablet (5 mg  total) by mouth at bedtime as needed (for reflux).  30 tablet  1  . pantoprazole (PROTONIX) 40 MG tablet TAKE 1 TABLET BY MOUTH TWICE A DAY 30 MINUTES BEFORE BREAKFAST  60 tablet  6    Review of Systems  HENT: Positive for neck pain.   All other systems reviewed and are negative.   Physical Exam   Blood pressure 152/72, pulse 58, temperature 98 F (36.7 C), temperature source Oral, resp. rate 22, SpO2 100.00%.  Physical Exam  Constitutional: She is oriented to person, place, and time. She appears well-developed and well-nourished. She appears distressed.  Screams out every few minutes about neck pain.  HENT:  Head: Normocephalic.  Neck: Normal range of motion. Neck supple.  Cardiovascular: Normal rate and regular rhythm.   Respiratory: Effort normal and breath sounds normal. No respiratory distress.  Neurological: She is alert and oriented to person, place, and time. She has normal reflexes.  Skin: Skin is warm and dry. No rash noted.    MAU Course  Procedures Consulted with Dr. Anitra Lauth at [facility] > transfer for further evaluation  Dr. Rana Snare notified and informed of  pt transfer; will try and obtain CT scan at [facility].  Assessment and Plan  Neck Pain  Plan: IV start Dilaudid for pain Transfer to [facility] 02/16/2013, 8:31 AM

## 2013-02-16 NOTE — ED Provider Notes (Signed)
History     CSN: [id number]  Arrival date & time 02/16/13  0803   First MD Initiated Contact with Patient 02/16/13 1001      Chief Complaint  Patient presents with  . Neck Pain    (Consider location/radiation/quality/duration/timing/severity/associated sxs/prior treatment) Patient is a 72 y.o. female presenting with neck pain. The history is provided by the patient.  Neck Pain  patient here complaining of sun onset of right-sided neck pain which awoke her from sleep this morning. No change in bowel or bladder function. History of chronic neck pain the past. Pain is localized to her right lateral neck and extends down to her trapezius and characterized as sharp. Was at [facility] hospital. Had abdominal CT when her pain became worse. She was given 2 mg of IV hydromorphone prior to arrival and does flow better. Denies any chest pain or shortness of breath. No anginal type symptoms. Abdominal CT is for evaluation of vaginal bleeding. Denies any severe headaches at this time. No visual changes noted.  Past Medical History  Diagnosis Date  . DM (diabetes mellitus)   . Dyslipidemia   . Diverticulosis   . Internal and external hemorrhoids without complication   . Arthritis   . Hypertension   . Hyperlipidemia   . Cardiac arrhythmia     now has pacemaker  . Potassium deficiency   . Fibromyalgia   . Allergy   . Cataract   . GERD (gastroesophageal reflux disease)   . Myocardial infarction     Past Surgical History  Procedure Laterality Date  . Knee surgery      right x 2  . Neck surgery    . Abdominal hysterectomy    . Colonoscopy  01/01/2010    diverticulosis, internal and external hemorrhoids  . Pacemaker insertion    . Appendectomy    . Upper gastrointestinal endoscopy    . Bil carple tunnel surgery      Family History  Problem Relation Age of Onset  . Coronary artery disease Father   . Prostate cancer Father   . Colon cancer Father   . Lung cancer Father   . Diabetes  Father   . Hypertension Neg Hx   . Esophageal cancer Neg Hx   . Rectal cancer Neg Hx   . Stomach cancer Neg Hx   . Diabetes Daughter   . Breast cancer Maternal Aunt   . Colon cancer Sister     x 2  . Colon cancer Brother   . Lung cancer Mother   . Colon polyps Sister   . Crohn's disease Sister   . Kidney disease Daughter   . Diabetes Cousin     multiple  . Diabetes Maternal Aunt     multiple  . Diabetes Maternal Uncle     multiple  . Irritable bowel syndrome Sister     History  Substance Use Topics  . Smoking status: Never Smoker   . Smokeless tobacco: Never Used  . Alcohol Use: No    OB History   Grav Para Term Preterm Abortions TAB SAB Ect Mult Living   2 2 2       2       Review of Systems  HENT: Positive for neck pain.   All other systems reviewed and are negative.    Allergies  Codeine and Metformin and related  Home Medications   Current Outpatient Rx  Name  Route  Sig  Dispense  Refill  . AMLODIPINE BESYLATE PO  Oral   Take 2.5 mg by mouth daily.         Marland Kitchen aspirin 81 MG tablet   Oral   Take 81 mg by mouth daily.          Marland Kitchen ezetimibe (ZETIA) 10 MG tablet   Oral   Take 10 mg by mouth daily.         Marland Kitchen glimepiride (AMARYL) 1 MG tablet   Oral   Take 1 mg by mouth 3 (three) times daily.         . hydrochlorothiazide (HYDRODIURIL) 25 MG tablet   Oral   Take 25 mg by mouth daily.         Marland Kitchen ibuprofen (ADVIL,MOTRIN) 200 MG tablet   Oral   Take 400 mg by mouth every 6 (six) hours as needed for pain.         Marland Kitchen lisinopril (PRINIVIL,ZESTRIL) 40 MG tablet   Oral   Take 40 mg by mouth daily.         . metoCLOPramide (REGLAN) 5 MG tablet   Oral   Take 5 mg by mouth at bedtime as needed (for reflux).         . pantoprazole (PROTONIX) 40 MG tablet   Oral   Take 40 mg by mouth 2 (two) times daily.           BP 142/64  Pulse 63  Temp(Src) 98 F (36.7 C) (Oral)  Resp 18  SpO2 100%  Physical Exam  Nursing note and vitals  reviewed. Constitutional: She is oriented to person, place, and time. She appears well-developed and well-nourished.  Non-toxic appearance. No distress.  HENT:  Head: Normocephalic and atraumatic.  Eyes: Conjunctivae, EOM and lids are normal. Pupils are equal, round, and reactive to light.  Neck: Neck supple. Muscular tenderness present. No spinous process tenderness present. No rigidity. Decreased range of motion present. No tracheal deviation present. No mass present.    Cardiovascular: Normal rate, regular rhythm and normal heart sounds.  Exam reveals no gallop.   No murmur heard. Pulmonary/Chest: Effort normal and breath sounds normal. No stridor. No respiratory distress. She has no decreased breath sounds. She has no wheezes. She has no rhonchi. She has no rales.  Abdominal: Soft. Normal appearance and bowel sounds are normal. She exhibits no distension. There is no tenderness. There is no rebound and no CVA tenderness.  Musculoskeletal: She exhibits no edema and no tenderness.  Neurological: She is alert and oriented to person, place, and time. She has normal strength. No cranial nerve deficit or sensory deficit. GCS eye subscore is 4. GCS verbal subscore is 5. GCS motor subscore is 6.  Skin: Skin is warm and dry. No abrasion and no rash noted.  Psychiatric: She has a normal mood and affect. Her speech is normal and behavior is normal.    ED Course  Procedures (including critical care time)  Labs Reviewed - No data to display No results found.   1. Neck pain       MDM  Pt given toradol and feels better--repeat exam shows better range of motion, pt informed of abd ct results, will f/u her gyn--  Neck pain is likely from her chronic condition and muscle strain        Toy Baker, MD 02/16/13 1304

## 2013-02-16 NOTE — ED Notes (Signed)
Pt coming from [facility] - drank contrast dye last night, woke up around 2am with neck pain, thought it was related to contrast, was seen at [facility]', sent to ED for further evaluation. Hx of 2 prior neck surgeries

## 2013-02-27 ENCOUNTER — Other Ambulatory Visit: Payer: Self-pay | Admitting: Internal Medicine

## 2013-02-27 ENCOUNTER — Ambulatory Visit (INDEPENDENT_AMBULATORY_CARE_PROVIDER_SITE_OTHER): Payer: Medicare Other | Admitting: *Deleted

## 2013-02-27 DIAGNOSIS — Z950 Presence of cardiac pacemaker: Secondary | ICD-10-CM

## 2013-02-27 DIAGNOSIS — I498 Other specified cardiac arrhythmias: Secondary | ICD-10-CM

## 2013-03-07 LAB — REMOTE PACEMAKER DEVICE
AL IMPEDENCE PM: 482 Ohm
AL THRESHOLD: 0.875 V
ATRIAL PACING PM: 98
BAMS-0001: 175 {beats}/min
BATTERY VOLTAGE: 2.75 V
RV LEAD AMPLITUDE: 22.4 mv
RV LEAD IMPEDENCE PM: 580 Ohm
RV LEAD THRESHOLD: 0.75 V
VENTRICULAR PACING PM: 0

## 2013-03-23 ENCOUNTER — Encounter: Payer: Self-pay | Admitting: *Deleted

## 2013-04-11 DIAGNOSIS — E782 Mixed hyperlipidemia: Secondary | ICD-10-CM | POA: Insufficient documentation

## 2013-04-19 ENCOUNTER — Encounter: Payer: Self-pay | Admitting: Internal Medicine

## 2013-05-09 DIAGNOSIS — F411 Generalized anxiety disorder: Secondary | ICD-10-CM | POA: Insufficient documentation

## 2013-06-05 ENCOUNTER — Ambulatory Visit (INDEPENDENT_AMBULATORY_CARE_PROVIDER_SITE_OTHER): Payer: Medicare Other | Admitting: *Deleted

## 2013-06-05 DIAGNOSIS — Z950 Presence of cardiac pacemaker: Secondary | ICD-10-CM

## 2013-06-05 DIAGNOSIS — I498 Other specified cardiac arrhythmias: Secondary | ICD-10-CM

## 2013-06-06 ENCOUNTER — Encounter: Payer: Self-pay | Admitting: Internal Medicine

## 2013-06-09 DIAGNOSIS — R609 Edema, unspecified: Secondary | ICD-10-CM | POA: Insufficient documentation

## 2013-06-09 LAB — REMOTE PACEMAKER DEVICE
AL IMPEDENCE PM: 469 Ohm
AL THRESHOLD: 0.875 V
ATRIAL PACING PM: 97
BAMS-0001: 175 {beats}/min
BATTERY VOLTAGE: 2.74 V
RV LEAD AMPLITUDE: 22.4 mv
RV LEAD IMPEDENCE PM: 574 Ohm
RV LEAD THRESHOLD: 0.75 V
VENTRICULAR PACING PM: 0

## 2013-06-12 ENCOUNTER — Ambulatory Visit (INDEPENDENT_AMBULATORY_CARE_PROVIDER_SITE_OTHER): Payer: Medicare Other | Admitting: Internal Medicine

## 2013-06-12 ENCOUNTER — Encounter: Payer: Self-pay | Admitting: Internal Medicine

## 2013-06-12 VITALS — BP 122/70 | HR 68 | Ht 65.0 in | Wt 169.2 lb

## 2013-06-12 DIAGNOSIS — R1314 Dysphagia, pharyngoesophageal phase: Secondary | ICD-10-CM

## 2013-06-12 DIAGNOSIS — K219 Gastro-esophageal reflux disease without esophagitis: Secondary | ICD-10-CM

## 2013-06-12 MED ORDER — OMEPRAZOLE-SODIUM BICARBONATE 40-1100 MG PO CAPS: 1.0000 | ORAL_CAPSULE | Freq: Every evening | ORAL | Status: DC

## 2013-06-12 NOTE — Progress Notes (Signed)
Subjective:    Patient ID: [name], female    DOB: 1941/09/22, 72 y.o.   MRN: [id number]  HPI Having persistent and increasing reflux, especially at night. Occurs a few hours into sleep. She also had an episode of possible aspiration with sore throat and raspy voice She tried metaclopramide 5 mg qhs and no help. Bed up 7 inches. No caffeine and waits 3+ hours before going to bed after eating. Also having burning esophageal pain and dysphagia after these episodes. Taking extra doses of pantoprazole to no avail  Allergies  Allergen Reactions  . Codeine Other (See Comments)    hallucinations  . Metformin And Related     Causes dehydration and causes me to go to the hospital   Outpatient Prescriptions Prior to Visit  Medication Sig Dispense Refill  . AMLODIPINE BESYLATE PO Take 2.5 mg by mouth daily.      Marland Kitchen aspirin 81 MG tablet Take 81 mg by mouth daily.       Marland Kitchen ezetimibe (ZETIA) 10 MG tablet Take 10 mg by mouth daily.      Marland Kitchen glimepiride (AMARYL) 1 MG tablet Take 1 mg by mouth 3 (three) times daily.      . hydrochlorothiazide (HYDRODIURIL) 25 MG tablet Take 25 mg by mouth daily.      Marland Kitchen lisinopril (PRINIVIL,ZESTRIL) 40 MG tablet Take 40 mg by mouth daily.      . pantoprazole (PROTONIX) 40 MG tablet Take 40 mg by mouth every morning.       Marland Kitchen HYDROcodone-acetaminophen (NORCO/VICODIN) 5-325 MG per tablet Take 2 tablets by mouth every 4 (four) hours as needed for pain.  10 tablet  0  . ibuprofen (ADVIL,MOTRIN) 200 MG tablet Take 400 mg by mouth every 6 (six) hours as needed for pain.      Marland Kitchen ibuprofen (ADVIL,MOTRIN) 400 MG tablet Take 1 tablet (400 mg total) by mouth every 6 (six) hours as needed for pain.  30 tablet  0  . methocarbamol (ROBAXIN) 500 MG tablet Take 1 tablet (500 mg total) by mouth 2 (two) times daily.  20 tablet  0           No facility-administered medications prior to visit.   Past Medical History  Diagnosis Date  . DM (diabetes mellitus)   . Dyslipidemia    . Diverticulosis   . Internal and external hemorrhoids without complication   . Arthritis   . Hypertension   . Hyperlipidemia   . Cardiac arrhythmia     now has pacemaker  . Potassium deficiency   . Fibromyalgia   . Allergy   . Cataract   . GERD (gastroesophageal reflux disease)   . Myocardial infarction    Past Surgical History  Procedure Laterality Date  . Knee surgery      right x 2  . Neck surgery    . Abdominal hysterectomy    . Colonoscopy  multiple  . Pacemaker insertion    . Appendectomy    . Upper gastrointestinal endoscopy    . Bil carple tunnel surgery      Review of Systems Vaginal vs. Urinary bleeding x 1 in 12/2012 - having GU evaluation - cystoscopy pending    Objective:   Physical Exam General:  NAD Eyes:   anicteric Lungs:  clear Heart:  S1S2 no rubs, murmurs or gallops Abdomen:  soft and nontender, BS+ Ext:   no edema    Data Reviewed:  Prior GI notes/procedures     Assessment &  Plan:  GERD (gastroesophageal reflux disease)  Dysphagia, pharyngoesophageal phase  She seems to be failing lifestyle changes, bid+ PPI, and a trial of low-dose metaclopramide. Having some dysphagia also. Not sure what problem is - not typical for gastroparesis but possible. ? If recurrent stricture issue. Last EGD w/ dili 2013. Given persistent problems, especially the dysphagia - will repeat EGD and plan for possible dilation. The risks and benefits as well as alternatives of endoscopic procedure(s) have been discussed and reviewed. All questions answered. The patient agrees to proceed. Pending that, could need a gastric emptying study. Need to consider re-trial of metaclopramide qhs with dose of 10 mg Have also given sample of Zegerid 40 mg to take Hs and stop suppertime pantoprazole.  ZO:XWRUE, TAMMY, MD

## 2013-06-12 NOTE — Patient Instructions (Addendum)
You have been scheduled for an endoscopy with propofol. Please follow written instructions given to you at your visit today. If you use inhalers (even only as needed), please bring them with you on the day of your procedure. Your physician has requested that you go to www.startemmi.com and enter the access code given to you at your visit today. This web site gives a general overview about your procedure. However, you should still follow specific instructions given to you by our office regarding your preparation for the procedure.  Try the sample of Zegerid we are providing you with today, one daily at bedtime in place of your night time pantoprazole dose.   I appreciate the opportunity to care for you.

## 2013-06-15 ENCOUNTER — Telehealth: Payer: Self-pay

## 2013-06-15 ENCOUNTER — Ambulatory Visit (AMBULATORY_SURGERY_CENTER): Payer: Medicare Other | Admitting: Internal Medicine

## 2013-06-15 ENCOUNTER — Encounter: Payer: Self-pay | Admitting: Internal Medicine

## 2013-06-15 VITALS — BP 147/70 | HR 60 | Temp 97.3°F | Resp 16 | Ht 65.0 in | Wt 169.0 lb

## 2013-06-15 DIAGNOSIS — K219 Gastro-esophageal reflux disease without esophagitis: Secondary | ICD-10-CM

## 2013-06-15 LAB — GLUCOSE, CAPILLARY
Glucose-Capillary: 127 mg/dL — ABNORMAL HIGH (ref 70–99)
Glucose-Capillary: 119 mg/dL — ABNORMAL HIGH (ref 70–99)

## 2013-06-15 MED ORDER — PANTOPRAZOLE SODIUM 40 MG PO TBEC: 40.0000 mg | DELAYED_RELEASE_TABLET | Freq: Two times a day (BID) | ORAL | Status: DC

## 2013-06-15 MED ORDER — SODIUM CHLORIDE 0.9 % IV SOLN: 500.0000 mL | INTRAVENOUS | Status: DC

## 2013-06-15 NOTE — Patient Instructions (Addendum)
The endoscopy looked ok.  I want you to take a tablespoon of Gaviscon at bedtime and call me back next week to tell me how that is helping. It shouldn't make your ankles swell. Stop the Zegerid. Take pantoprazole twice a day.  I appreciate the opportunity to care for you. Iva Boop, MD, FACG YOU HAD AN ENDOSCOPIC PROCEDURE TODAY AT THE [facility]: Refer to the procedure report that was given to you for any specific questions about what was found during the examination.  If the procedure report does not answer your questions, please call your gastroenterologist to clarify.  If you requested that your care partner not be given the details of your procedure findings, then the procedure report has been included in a sealed envelope for you to review at your convenience later.  YOU SHOULD EXPECT: Some feelings of bloating in the abdomen. Passage of more gas than usual.  Walking can help get rid of the air that was put into your GI tract during the procedure and reduce the bloating. If you had a lower endoscopy (such as a colonoscopy or flexible sigmoidoscopy) you may notice spotting of blood in your stool or on the toilet paper. If you underwent a bowel prep for your procedure, then you may not have a normal bowel movement for a few days.  DIET: Your first meal following the procedure should be a light meal and then it is ok to progress to your normal diet.  A half-sandwich or bowl of soup is an example of a good first meal.  Heavy or fried foods are harder to digest and may make you feel nauseous or bloated.  Likewise meals heavy in dairy and vegetables can cause extra gas to form and this can also increase the bloating.  Drink plenty of fluids but you should avoid alcoholic beverages for 24 hours.  ACTIVITY: Your care partner should take you home directly after the procedure.  You should plan to take it easy, moving slowly for the rest of the day.  You can resume normal activity the day  after the procedure however you should NOT DRIVE or use heavy machinery for 24 hours (because of the sedation medicines used during the test).    SYMPTOMS TO REPORT IMMEDIATELY: A gastroenterologist can be reached at any hour.  During normal business hours, 8:30 AM to 5:00 PM Monday through Friday, call [phone].  After hours and on weekends, please call the GI answering service at [phone] who will take a message and have the physician on call contact you.    Following upper endoscopy (EGD)  Vomiting of blood or coffee ground material  New chest pain or pain under the shoulder blades  Painful or persistently difficult swallowing  New shortness of breath  Fever of 100F or higher  Black, tarry-looking stools  FOLLOW UP: If any biopsies were taken you will be contacted by phone or by letter within the next 1-3 weeks.  Call your gastroenterologist if you have not heard about the biopsies in 3 weeks.  Our staff will call the home number listed on your records the next business day following your procedure to check on you and address any questions or concerns that you may have at that time regarding the information given to you following your procedure. This is a courtesy call and so if there is no answer at the home number and we have not heard from you through the emergency physician on call, we  will assume that you have returned to your regular daily activities without incident.  SIGNATURES/CONFIDENTIALITY: You and/or your care partner have signed paperwork which will be entered into your electronic medical record.  These signatures attest to the fact that that the information above on your After Visit Summary has been reviewed and is understood.  Full responsibility of the confidentiality of this discharge information lies with you and/or your care-partner.

## 2013-06-15 NOTE — Progress Notes (Signed)
A/ox3 pleased with MAC, report to Karen RN 

## 2013-06-15 NOTE — Progress Notes (Signed)
Patient did not have preoperative order for IV antibiotic SSI prophylaxis. (G8918)  Patient did not experience any of the following events: a burn prior to discharge; a fall within the facility; wrong site/side/patient/procedure/implant event; or a hospital transfer or hospital admission upon discharge from the facility. (G8907)  

## 2013-06-15 NOTE — Op Note (Signed)
[facility] [address] [place], [address]   ENDOSCOPY PROCEDURE REPORT  PATIENT: Kimaya, Whitlatch  MR#: [id number] BIRTHDATE: 1941/09/14 , 72  yrs. old GENDER: Female ENDOSCOPIST: Iva Boop, MD, [facility] PROCEDURE DATE:  06/15/2013 PROCEDURE:  EGD, diagnostic ASA CLASS:     Class II INDICATIONS:  Heartburn.  despite PPI MEDICATIONS: propofol (Diprivan) 100mg  IV, MAC sedation, administered by CRNA, and These medications were titrated to patient response per physician's verbal order TOPICAL ANESTHETIC: none  DESCRIPTION OF PROCEDURE: After the risks benefits and alternatives of the procedure were thoroughly explained, informed consent was obtained.  The LB [id number] endoscope was introduced through the mouth and advanced to the second portion of the duodenum. Without limitations.  The instrument was slowly withdrawn as the mucosa was fully examined.      STOMACH: A 2 cm hiatal hernia was noted.  The remainder of the upper endoscopy exam was otherwise normal. Retroflexed views revealed a hiatal hernia.     The scope was then withdrawn from the patient and the procedure completed.  COMPLICATIONS: There were no complications. ENDOSCOPIC IMPRESSION: 1.   2 cm hiatal hernia 2.   The remainder of the upper endoscopy exam was otherwise normal  RECOMMENDATIONS: Change pantoprazole to bid Gaviscon at bedtime call me in 1 week with up date stop Zegerid (ankle edema)   eSigned:  Iva Boop, MD, [facility] 06/15/2013 8:33 AM  HQ:IONGE Collins [place], MD and The Patient

## 2013-06-15 NOTE — Telephone Encounter (Signed)
Faxed forms for Prior Authorization for Pantoprazole tab 40 mg, one twice a day for dx: 530.81 to [employer] fax # [phone], patient has tried and failed Prilosec 20mg  and also Regan 5 mg.  Patient aware we are working on this.

## 2013-06-16 ENCOUNTER — Telehealth: Payer: Self-pay

## 2013-06-16 NOTE — Telephone Encounter (Signed)
Left message on answering machine. 

## 2013-06-21 ENCOUNTER — Telehealth: Payer: Self-pay

## 2013-06-21 NOTE — Telephone Encounter (Signed)
Did prior authorization on Pantoprazole 40mg  , one twice a day on 06-15-13, called to check on the status and was told per Byrd Hesselbach at [facility] not needed.  Called [facility] and spoke to Kirby in the pharmacy to notify her that prior authorization not needed.  She re-tried running rx thru and it went fine.

## 2013-09-07 LAB — MDC_IDC_ENUM_SESS_TYPE_REMOTE
Battery Impedance: 1596 Ohm
Battery Remaining Longevity: 31 mo
Battery Voltage: 2.74 V
Brady Statistic AP VP Percent: 0 %
Brady Statistic AP VS Percent: 98 %
Brady Statistic AS VP Percent: 0 %
Brady Statistic AS VS Percent: 2 %
Date Time Interrogation Session: 20141106220305
Lead Channel Impedance Value: 481 Ohm
Lead Channel Impedance Value: 584 Ohm
Lead Channel Pacing Threshold Amplitude: 0.5 V
Lead Channel Pacing Threshold Amplitude: 0.75 V
Lead Channel Pacing Threshold Pulse Width: 0.4 ms
Lead Channel Pacing Threshold Pulse Width: 0.4 ms
Lead Channel Sensing Intrinsic Amplitude: 22.4 mV
Lead Channel Setting Pacing Amplitude: 2 V
Lead Channel Setting Pacing Amplitude: 2.5 V
Lead Channel Setting Pacing Pulse Width: 0.46 ms
Lead Channel Setting Sensing Sensitivity: 5.6 mV

## 2013-09-11 ENCOUNTER — Ambulatory Visit (INDEPENDENT_AMBULATORY_CARE_PROVIDER_SITE_OTHER): Payer: Medicare Other | Admitting: *Deleted

## 2013-09-11 DIAGNOSIS — Z950 Presence of cardiac pacemaker: Secondary | ICD-10-CM

## 2013-09-11 DIAGNOSIS — I498 Other specified cardiac arrhythmias: Secondary | ICD-10-CM

## 2013-10-03 ENCOUNTER — Encounter: Payer: Self-pay | Admitting: *Deleted

## 2013-10-30 ENCOUNTER — Encounter: Payer: Self-pay | Admitting: Internal Medicine

## 2013-11-30 ENCOUNTER — Encounter (INDEPENDENT_AMBULATORY_CARE_PROVIDER_SITE_OTHER): Payer: Self-pay

## 2013-11-30 ENCOUNTER — Ambulatory Visit (INDEPENDENT_AMBULATORY_CARE_PROVIDER_SITE_OTHER): Payer: Medicare Other | Admitting: Internal Medicine

## 2013-11-30 ENCOUNTER — Encounter: Payer: Self-pay | Admitting: Internal Medicine

## 2013-11-30 VITALS — BP 128/80 | HR 65 | Ht 66.0 in | Wt 170.5 lb

## 2013-11-30 DIAGNOSIS — I498 Other specified cardiac arrhythmias: Secondary | ICD-10-CM

## 2013-11-30 DIAGNOSIS — Z950 Presence of cardiac pacemaker: Secondary | ICD-10-CM

## 2013-11-30 DIAGNOSIS — I10 Essential (primary) hypertension: Secondary | ICD-10-CM

## 2013-11-30 LAB — MDC_IDC_ENUM_SESS_TYPE_REMOTE
Battery Impedance: 1711 Ohm
Battery Remaining Longevity: 29 mo
Battery Voltage: 2.73 V
Brady Statistic AP VP Percent: 0 %
Brady Statistic AP VS Percent: 98 %
Brady Statistic AS VP Percent: 0 %
Brady Statistic AS VS Percent: 2 %
Date Time Interrogation Session: 20150129084057
Lead Channel Impedance Value: 481 Ohm
Lead Channel Impedance Value: 600 Ohm
Lead Channel Pacing Threshold Amplitude: 0.5 V
Lead Channel Pacing Threshold Amplitude: 0.75 V
Lead Channel Pacing Threshold Pulse Width: 0.4 ms
Lead Channel Pacing Threshold Pulse Width: 0.4 ms
Lead Channel Sensing Intrinsic Amplitude: 22.4 mV
Lead Channel Setting Pacing Amplitude: 2 V
Lead Channel Setting Pacing Amplitude: 2.5 V
Lead Channel Setting Pacing Pulse Width: 0.4 ms
Lead Channel Setting Sensing Sensitivity: 5.6 mV

## 2013-11-30 NOTE — Assessment & Plan Note (Signed)
Her blood pressure is well controlled on medical therapy. Will continue a low sodium diet.

## 2013-11-30 NOTE — Assessment & Plan Note (Signed)
Her Med[employer] DDD PM is working normally. Will recheck in several months. 

## 2013-11-30 NOTE — Patient Instructions (Signed)

## 2013-11-30 NOTE — Progress Notes (Signed)
HPI [name] returns today for followup. She is a very pleasant 73 year old woman with hypertension and sinus node dysfunction, status post permanent pacemaker insertion. She denies shortness of breath or syncope. At nighttime, she has minimal peripheral edema. Her muscle aches are improved. No edema.  Allergies  Allergen Reactions  . Codeine Other (See Comments)    hallucinations  . Metformin And Related     Causes dehydration and causes me to go to the hospital     Current Outpatient Prescriptions  Medication Sig Dispense Refill  . ALPRAZolam (XANAX) 1 MG tablet Take 1 mg by mouth as needed.      Marland Kitchen. AMLODIPINE BESYLATE PO Take 2.5 mg by mouth daily.      Marland Kitchen. aspirin 81 MG tablet Take 81 mg by mouth daily.       Marland Kitchen. glimepiride (AMARYL) 4 MG tablet Take 4 mg by mouth 3 (three) times daily.      . hydrochlorothiazide (HYDRODIURIL) 25 MG tablet Take 25 mg by mouth daily.      Marland Kitchen. lisinopril (PRINIVIL,ZESTRIL) 40 MG tablet Take 40 mg by mouth daily.      . pantoprazole (PROTONIX) 40 MG tablet Take 1 tablet (40 mg total) by mouth 2 (two) times daily before a meal. 30 minutes before breakfast and supper  60 tablet  11  . PARoxetine (PAXIL) 20 MG tablet Take 1 tablet by mouth daily.      . traZODone (DESYREL) 50 MG tablet Take 50 mg by mouth at bedtime.      [name]. WELCHOL 625 MG tablet Take 3 tablets by mouth 2 (two) times daily.       No current facility-administered medications for this visit.     Past Medical History  Diagnosis Date  . DM (diabetes mellitus)   . Dyslipidemia   . Diverticulosis   . Internal and external hemorrhoids without complication   . Arthritis   . Hypertension   . Hyperlipidemia   . Cardiac arrhythmia     now has pacemaker  . Potassium deficiency   . Fibromyalgia   . Allergy   . Cataract   . GERD (gastroesophageal reflux disease)   . Myocardial infarction     ROS:   All systems reviewed and negative except as noted in the HPI.   Past Surgical  History  Procedure Laterality Date  . Knee surgery      right x 2  . Neck surgery    . Abdominal hysterectomy    . Colonoscopy  multiple  . Pacemaker insertion    . Appendectomy    . Upper gastrointestinal endoscopy    . Bil carple tunnel surgery       Family History  Problem Relation Age of Onset  . Coronary artery disease Father   . Prostate cancer Father   . Colon cancer Father   . Lung cancer Father   . Diabetes Father   . Hypertension Neg Hx   . Esophageal cancer Neg Hx   . Rectal cancer Neg Hx   . Stomach cancer Neg Hx   . Diabetes Daughter   . Breast cancer Maternal Aunt   . Colon cancer Sister     x 2  . Colon cancer Brother   . Lung cancer Mother   . Colon polyps Sister   . Crohn's disease Sister   . Kidney disease Daughter   . Diabetes Cousin     multiple  . Diabetes Maternal Aunt     multiple  .  Diabetes Maternal Uncle     multiple  . Irritable bowel syndrome Sister      History   Social History  . Marital Status: Married    Spouse Name: N/A    Number of Children: N/A  . Years of Education: N/A   Occupational History  . Not on file.   Social History Main Topics  . Smoking status: Never Smoker   . Smokeless tobacco: Never Used  . Alcohol Use: No  . Drug Use: No  . Sexual Activity: Not on file   Other Topics Concern  . Not on file   Social History Narrative  . No narrative on file     BP 128/80  Pulse 65  Ht 5\' 6"  (1.676 m)  Wt 170 lb 8 oz (77.338 kg)  BMI 27.53 kg/m2  Physical Exam:  Well appearing 73 year old woman, NAD HEENT: Unremarkable Neck:  No JVD, no thyromegally Lungs:  Clear with no wheezes, rales, or rhonchi. HEART:  Regular rate rhythm, no murmurs, no rubs, no clicks Abd:  soft, positive bowel sounds, no organomegally, no rebound, no guarding Ext:  2 plus pulses, no edema, no cyanosis, no clubbing Skin:  No rashes no nodules Neuro:  CN II through XII intact, motor grossly intact  DEVICE  Normal device  function.  See PaceArt for details.   Assess/Plan:

## 2013-12-11 ENCOUNTER — Encounter: Payer: Self-pay | Admitting: *Deleted

## 2014-01-16 ENCOUNTER — Other Ambulatory Visit: Payer: Self-pay

## 2014-01-16 DIAGNOSIS — Z1231 Encounter for screening mammogram for malignant neoplasm of breast: Secondary | ICD-10-CM

## 2014-02-20 ENCOUNTER — Ambulatory Visit
Admission: RE | Admit: 2014-02-20 | Discharge: 2014-02-20 | Disposition: A | Discharge status: Home / Self Care | Payer: Medicare Other | Source: Ambulatory Visit

## 2014-02-20 ENCOUNTER — Encounter (INDEPENDENT_AMBULATORY_CARE_PROVIDER_SITE_OTHER): Payer: Self-pay

## 2014-02-20 DIAGNOSIS — Z1231 Encounter for screening mammogram for malignant neoplasm of breast: Secondary | ICD-10-CM

## 2014-03-06 ENCOUNTER — Ambulatory Visit (INDEPENDENT_AMBULATORY_CARE_PROVIDER_SITE_OTHER): Payer: Medicare Other | Admitting: *Deleted

## 2014-03-06 DIAGNOSIS — I495 Sick sinus syndrome: Secondary | ICD-10-CM

## 2014-03-07 DIAGNOSIS — I495 Sick sinus syndrome: Secondary | ICD-10-CM

## 2014-03-19 LAB — MDC_IDC_ENUM_SESS_TYPE_REMOTE
Battery Impedance: 1891 Ohm
Battery Remaining Longevity: 26 mo
Battery Voltage: 2.73 V
Brady Statistic AP VP Percent: 0 %
Brady Statistic AP VS Percent: 94 %
Brady Statistic AS VP Percent: 0 %
Brady Statistic AS VS Percent: 5 %
Date Time Interrogation Session: 20150506194357
Lead Channel Impedance Value: 481 Ohm
Lead Channel Impedance Value: 606 Ohm
Lead Channel Pacing Threshold Amplitude: 0.75 V
Lead Channel Pacing Threshold Amplitude: 0.75 V
Lead Channel Pacing Threshold Pulse Width: 0.4 ms
Lead Channel Pacing Threshold Pulse Width: 0.4 ms
Lead Channel Sensing Intrinsic Amplitude: 16 mV
Lead Channel Setting Pacing Amplitude: 2 V
Lead Channel Setting Pacing Amplitude: 2.5 V
Lead Channel Setting Pacing Pulse Width: 0.4 ms
Lead Channel Setting Sensing Sensitivity: 5.6 mV

## 2014-03-20 NOTE — Progress Notes (Signed)
Remote pacemaker transmission.   

## 2014-03-30 ENCOUNTER — Encounter: Payer: Self-pay | Admitting: Cardiology

## 2014-04-10 ENCOUNTER — Encounter: Payer: Self-pay | Admitting: Internal Medicine

## 2014-06-11 ENCOUNTER — Other Ambulatory Visit: Payer: Self-pay | Admitting: Internal Medicine

## 2014-06-11 ENCOUNTER — Telehealth: Payer: Self-pay | Admitting: Cardiology

## 2014-06-11 ENCOUNTER — Encounter: Payer: Medicare Other | Admitting: *Deleted

## 2014-06-11 NOTE — Telephone Encounter (Signed)
Pt called and stated that she has attempted to send transmission. I helped pt trouble shoot monitor. Pt stated that her phone company was with [employer] cable I informed pt that this was probably the problem and that I would mail her a phone filter. I explained to her what it was and how to hook it up. I informed pt that when she received the filter and if she had any problems hooking it up to give us a call. Pt verbalized understanding.

## 2014-06-12 ENCOUNTER — Encounter: Payer: Self-pay | Admitting: Cardiology

## 2014-06-26 ENCOUNTER — Telehealth: Payer: Self-pay | Admitting: *Deleted

## 2014-06-26 NOTE — Telephone Encounter (Signed)
Pt switched to [name] cable. She has DSL filter. She tried the ports on her modem that would allow a phone jack--nothing was successful.  Pt will now be seen in clinic every six months. Pt concerned about PAF episodes while sleeping. ROV w/ device clinic 07/05/14.

## 2014-07-05 ENCOUNTER — Ambulatory Visit (INDEPENDENT_AMBULATORY_CARE_PROVIDER_SITE_OTHER): Payer: Medicare Other | Admitting: *Deleted

## 2014-07-05 DIAGNOSIS — I498 Other specified cardiac arrhythmias: Secondary | ICD-10-CM

## 2014-07-05 LAB — MDC_IDC_ENUM_SESS_TYPE_INCLINIC
Battery Impedance: 2076 Ohm
Battery Remaining Longevity: 24 mo
Battery Voltage: 2.72 V
Brady Statistic AP VP Percent: 0 %
Brady Statistic AP VS Percent: 86 %
Brady Statistic AS VP Percent: 0 %
Brady Statistic AS VS Percent: 14 %
Date Time Interrogation Session: 20150903094722
Lead Channel Impedance Value: 456 Ohm
Lead Channel Impedance Value: 620 Ohm
Lead Channel Pacing Threshold Amplitude: 0.5 V
Lead Channel Pacing Threshold Amplitude: 0.75 V
Lead Channel Pacing Threshold Pulse Width: 0.4 ms
Lead Channel Pacing Threshold Pulse Width: 0.4 ms
Lead Channel Sensing Intrinsic Amplitude: 2.8 mV
Lead Channel Sensing Intrinsic Amplitude: 22.4 mV
Lead Channel Setting Pacing Amplitude: 2 V
Lead Channel Setting Pacing Amplitude: 2.5 V
Lead Channel Setting Pacing Pulse Width: 0.4 ms
Lead Channel Setting Sensing Sensitivity: 5.6 mV

## 2014-07-05 NOTE — Progress Notes (Signed)
Pacemaker check in clinic. Normal device function. Thresholds, sensing, impedances consistent with previous measurements. Device programmed to maximize longevity. 11 mode switches all < 1 minute.  No high ventricular rates noted. Device programmed at appropriate safety margins. Histogram distribution appropriate for patient activity level. Device programmed to optimize intrinsic conduction. Estimated longevity 24 months. Patient enrolled in remote follow-up/TTM's with [employer]. Plan to follow every 3 months remotely and see annually in office. Patient education completed.  [facility] 10/08/14.

## 2014-07-10 ENCOUNTER — Other Ambulatory Visit: Payer: Self-pay | Admitting: Internal Medicine

## 2014-07-23 ENCOUNTER — Encounter: Payer: Self-pay | Admitting: Internal Medicine

## 2014-08-07 ENCOUNTER — Other Ambulatory Visit: Payer: Self-pay | Admitting: Internal Medicine

## 2014-08-08 ENCOUNTER — Encounter (HOSPITAL_COMMUNITY): Payer: Self-pay | Admitting: Emergency Medicine

## 2014-08-08 ENCOUNTER — Emergency Department (HOSPITAL_COMMUNITY): Payer: Medicare Other

## 2014-08-08 ENCOUNTER — Emergency Department (HOSPITAL_COMMUNITY)
Admission: EM | Admit: 2014-08-08 | Discharge: 2014-08-08 | Disposition: A | Discharge status: Home / Self Care | Payer: Medicare Other | Attending: Emergency Medicine | Admitting: Emergency Medicine

## 2014-08-08 DIAGNOSIS — Z950 Presence of cardiac pacemaker: Secondary | ICD-10-CM | POA: Insufficient documentation

## 2014-08-08 DIAGNOSIS — M797 Fibromyalgia: Secondary | ICD-10-CM | POA: Insufficient documentation

## 2014-08-08 DIAGNOSIS — Z7982 Long term (current) use of aspirin: Secondary | ICD-10-CM | POA: Diagnosis not present

## 2014-08-08 DIAGNOSIS — E876 Hypokalemia: Secondary | ICD-10-CM | POA: Diagnosis not present

## 2014-08-08 DIAGNOSIS — H269 Unspecified cataract: Secondary | ICD-10-CM | POA: Insufficient documentation

## 2014-08-08 DIAGNOSIS — R200 Anesthesia of skin: Secondary | ICD-10-CM | POA: Diagnosis not present

## 2014-08-08 DIAGNOSIS — Z79899 Other long term (current) drug therapy: Secondary | ICD-10-CM | POA: Diagnosis not present

## 2014-08-08 DIAGNOSIS — I252 Old myocardial infarction: Secondary | ICD-10-CM | POA: Insufficient documentation

## 2014-08-08 DIAGNOSIS — E785 Hyperlipidemia, unspecified: Secondary | ICD-10-CM | POA: Insufficient documentation

## 2014-08-08 DIAGNOSIS — E119 Type 2 diabetes mellitus without complications: Secondary | ICD-10-CM | POA: Diagnosis not present

## 2014-08-08 DIAGNOSIS — R251 Tremor, unspecified: Secondary | ICD-10-CM | POA: Insufficient documentation

## 2014-08-08 DIAGNOSIS — I10 Essential (primary) hypertension: Secondary | ICD-10-CM | POA: Insufficient documentation

## 2014-08-08 DIAGNOSIS — M1990 Unspecified osteoarthritis, unspecified site: Secondary | ICD-10-CM | POA: Diagnosis not present

## 2014-08-08 DIAGNOSIS — K219 Gastro-esophageal reflux disease without esophagitis: Secondary | ICD-10-CM | POA: Insufficient documentation

## 2014-08-08 DIAGNOSIS — IMO0001 Reserved for inherently not codable concepts without codable children: Secondary | ICD-10-CM

## 2014-08-08 LAB — URINALYSIS, ROUTINE W REFLEX MICROSCOPIC
Bilirubin Urine: NEGATIVE
Glucose, UA: NEGATIVE mg/dL
Hgb urine dipstick: NEGATIVE
Ketones, ur: NEGATIVE mg/dL
Nitrite: NEGATIVE
Protein, ur: NEGATIVE mg/dL
Specific Gravity, Urine: 1.01 (ref 1.005–1.030)
Urobilinogen, UA: 1 mg/dL (ref 0.0–1.0)
pH: 7 (ref 5.0–8.0)

## 2014-08-08 LAB — CBC WITH DIFFERENTIAL/PLATELET
Basophils Absolute: 0 10*3/uL (ref 0.0–0.1)
Basophils Relative: 0 % (ref 0–1)
Eosinophils Absolute: 0 10*3/uL (ref 0.0–0.7)
Eosinophils Relative: 1 % (ref 0–5)
HCT: 39 % (ref 36.0–46.0)
Hemoglobin: 13.7 g/dL (ref 12.0–15.0)
Lymphocytes Relative: 34 % (ref 12–46)
Lymphs Abs: 1.6 10*3/uL (ref 0.7–4.0)
MCH: 30.8 pg (ref 26.0–34.0)
MCHC: 35.1 g/dL (ref 30.0–36.0)
MCV: 87.6 fL (ref 78.0–100.0)
Monocytes Absolute: 0.6 10*3/uL (ref 0.1–1.0)
Monocytes Relative: 13 % — ABNORMAL HIGH (ref 3–12)
Neutro Abs: 2.5 10*3/uL (ref 1.7–7.7)
Neutrophils Relative %: 52 % (ref 43–77)
Platelets: 222 10*3/uL (ref 150–400)
RBC: 4.45 MIL/uL (ref 3.87–5.11)
RDW: 13.4 % (ref 11.5–15.5)
WBC: 4.8 10*3/uL (ref 4.0–10.5)

## 2014-08-08 LAB — COMPREHENSIVE METABOLIC PANEL
ALT: 64 U/L — ABNORMAL HIGH (ref 0–35)
AST: 67 U/L — ABNORMAL HIGH (ref 0–37)
Albumin: 3.9 g/dL (ref 3.5–5.2)
Alkaline Phosphatase: 79 U/L (ref 39–117)
Anion gap: 15 (ref 5–15)
BUN: 15 mg/dL (ref 6–23)
CO2: 27 mEq/L (ref 19–32)
Calcium: 9.3 mg/dL (ref 8.4–10.5)
Chloride: 102 mEq/L (ref 96–112)
Creatinine, Ser: 0.78 mg/dL (ref 0.50–1.10)
GFR calc Af Amer: >90 mL/min (ref >90)
GFR calc non Af Amer: 82 mL/min — ABNORMAL LOW (ref >90)
Glucose, Bld: 137 mg/dL — ABNORMAL HIGH (ref 70–99)
Potassium: 3.3 mEq/L — ABNORMAL LOW (ref 3.7–5.3)
Sodium: 144 mEq/L (ref 137–147)
Total Bilirubin: 0.8 mg/dL (ref 0.3–1.2)
Total Protein: 7.3 g/dL (ref 6.0–8.3)

## 2014-08-08 LAB — URINE MICROSCOPIC-ADD ON

## 2014-08-08 LAB — I-STAT TROPONIN, ED: Troponin i, poc: 0.01 ng/mL (ref 0.00–0.08)

## 2014-08-08 MED ORDER — POTASSIUM CHLORIDE 20 MEQ/15ML (10%) PO LIQD
40.0000 meq | Freq: Once | ORAL | Status: AC
  Administered 2014-08-08: 40 meq via ORAL
  Filled 2014-08-08: qty 30

## 2014-08-08 NOTE — ED Notes (Signed)
I have just completed her pacer interrogation procedure.  She remains in no distress, however, she is somewhat anxious.

## 2014-08-08 NOTE — ED Notes (Signed)
Per EMS: Pt states that she has a family history of parkinson's disease.  Pt states she has been shaking all over at night, when she is relaxing.  Thinks she has parkinson's.  States nausea but no vomiting.  Increased weakness and clumsiness.  Hx of falls.  No pain or injuries.

## 2014-08-08 NOTE — ED Notes (Signed)
Patient transported to CT 

## 2014-08-08 NOTE — ED Notes (Signed)
She tells me that she has had involuntary "shakes" x 2 months; one of which was so significant it caused her to fall out of bed (~2 weeks ago) at which time she landed on her bottom.  She also relates the experience of losing her daughter in May of this year.  I had the privilege of having taken care of her daughter on many occasions, and she tells me she is pleased that I am her nurse today.

## 2014-08-08 NOTE — ED Provider Notes (Signed)
CSN: [id number]     Arrival date & time 08/08/14  1103 History   First MD Initiated Contact with Patient 08/08/14 1157     Chief Complaint  Patient presents with  . Shaking     (Consider location/radiation/quality/duration/timing/severity/associated sxs/prior Treatment) HPI  This is a 73 year old female who presents with "shaking all over." Patient with a history of diabetes, hypertension, hyperlipidemia, arrhythmia status post pacemaker and coronary artery disease. Patient reports the last several months she's had worsening shaking at night time. She states that it keeps her up and she is unable to sleep. She also reports feeling more off balance and having more falls at home. She does not ambulate with a walker. She reports migrating paresthesias. She states that last night was the worst and she woke up with generalized body shaking and palpitations. She currently takes Paxil and Xanax. She takes Xanax just before bedtime. She denies any chest pain, shortness of breath, abdominal pain, difficulty speaking, recent fevers.  Past Medical History  Diagnosis Date  . DM (diabetes mellitus)   . Dyslipidemia   . Diverticulosis   . Internal and external hemorrhoids without complication   . Arthritis   . Hypertension   . Hyperlipidemia   . Cardiac arrhythmia     now has pacemaker  . Potassium deficiency   . Fibromyalgia   . Allergy   . Cataract   . GERD (gastroesophageal reflux disease)   . Myocardial infarction    Past Surgical History  Procedure Laterality Date  . Knee surgery      right x 2  . Neck surgery    . Abdominal hysterectomy    . Colonoscopy  multiple  . Pacemaker insertion    . Appendectomy    . Upper gastrointestinal endoscopy    . Bil carple tunnel surgery     Family History  Problem Relation Age of Onset  . Coronary artery disease Father   . Prostate cancer Father   . Colon cancer Father   . Lung cancer Father   . Diabetes Father   . Hypertension Neg Hx   .  Esophageal cancer Neg Hx   . Rectal cancer Neg Hx   . Stomach cancer Neg Hx   . Diabetes Daughter   . Breast cancer Maternal Aunt   . Colon cancer Sister     x 2  . Colon cancer Brother   . Lung cancer Mother   . Colon polyps Sister   . Crohn's disease Sister   . Kidney disease Daughter   . Diabetes Cousin     multiple  . Diabetes Maternal Aunt     multiple  . Diabetes Maternal Uncle     multiple  . Irritable bowel syndrome Sister    History  Substance Use Topics  . Smoking status: Never Smoker   . Smokeless tobacco: Never Used  . Alcohol Use: No   OB History   Grav Para Term Preterm Abortions TAB SAB Ect Mult Living   2 2 2       2      Review of Systems  Constitutional: Negative for fever.  Respiratory: Negative for chest tightness and shortness of breath.   Cardiovascular: Negative for chest pain.  Gastrointestinal: Negative for nausea, vomiting and abdominal pain.  Genitourinary: Negative for dysuria.  Musculoskeletal: Negative for back pain.  Neurological: Positive for numbness. Negative for dizziness, speech difficulty, weakness, light-headedness and headaches.  All other systems reviewed and are negative.     Allergies  Caffeine; Codeine; and Metformin and related  Home Medications   Prior to Admission medications   Medication Sig Start Date End Date Taking? Authorizing Provider  ALPRAZolam Prudy Feeler) 1 MG tablet Take 1 mg by mouth at bedtime as needed for sleep.  10/04/13  Yes Historical Provider, MD  amLODipine (NORVASC) 2.5 MG tablet Take 2.5 mg by mouth daily.   Yes Historical Provider, MD  aspirin 81 MG tablet Take 81 mg by mouth daily.    Yes Historical Provider, MD  colesevelam (WELCHOL) 625 MG tablet Take 1,875 mg by mouth 2 (two) times daily with a meal.   Yes Historical Provider, MD  DULoxetine (CYMBALTA) 20 MG capsule Take 20 mg by mouth daily.   Yes Historical Provider, MD  glimepiride (AMARYL) 4 MG tablet Take 4 mg by mouth 4 (four) times daily -   before meals and at bedtime.    Yes Historical Provider, MD  hydrochlorothiazide (HYDRODIURIL) 25 MG tablet Take 25 mg by mouth daily.   Yes Historical Provider, MD  lisinopril (PRINIVIL,ZESTRIL) 40 MG tablet Take 40 mg by mouth daily.   Yes Historical Provider, MD  pantoprazole (PROTONIX) 40 MG tablet Take 40 mg by mouth 2 (two) times daily before a meal.   Yes Historical Provider, MD  PARoxetine (PAXIL) 20 MG tablet Take 20 mg by mouth daily.  09/11/13  Yes Historical Provider, MD   BP 153/72  Pulse 78  Temp(Src) 98.3 F (36.8 C) (Oral)  Resp 18  SpO2 98% Physical Exam  Nursing note and vitals reviewed. Constitutional: She is oriented to person, place, and time. No distress.  HENT:  Head: Normocephalic and atraumatic.  Eyes: Pupils are equal, round, and reactive to light.  Neck: Neck supple.  Cardiovascular: Normal rate, regular rhythm and normal heart sounds.   No murmur heard. Pulmonary/Chest: Effort normal and breath sounds normal. No respiratory distress. She has no wheezes.  Abdominal: Soft. Bowel sounds are normal. There is no tenderness. There is no rebound.  Neurological: She is alert and oriented to person, place, and time.  Cranial nerves II through XII intact, no dysmetria to finger-nose-finger, 5 out of 5 strength in all 4 extremities, decreased sensation over the bilateral feet, mild resting tremor  Skin: Skin is warm and dry.  Psychiatric: She has a normal mood and affect.    ED Course  Procedures (including critical care time) Labs Review Labs Reviewed  CBC WITH DIFFERENTIAL - Abnormal; Notable for the following:    Monocytes Relative 13 (*)    All other components within normal limits  COMPREHENSIVE METABOLIC PANEL - Abnormal; Notable for the following:    Potassium 3.3 (*)    Glucose, Bld 137 (*)    AST 67 (*)    ALT 64 (*)    GFR calc non Af Amer 82 (*)    All other components within normal limits  URINALYSIS, ROUTINE W REFLEX MICROSCOPIC - Abnormal;  Notable for the following:    Leukocytes, UA SMALL (*)    All other components within normal limits  URINE MICROSCOPIC-ADD ON  I-STAT TROPOININ, ED    Imaging Review Ct Head Wo Contrast  08/08/2014   CLINICAL DATA:  Involuntary "shakes" for 2 months. Caused her to fall out of bed.  EXAM: CT HEAD WITHOUT CONTRAST  TECHNIQUE: Contiguous axial images were obtained from the base of the skull through the vertex without intravenous contrast.  COMPARISON:  None  FINDINGS: There is a focal, nonspecific area of low attenuation at the  gray-white matter junction of the posterior right frontal lobe, image 15/series 2. The remaining portions of the cerebral and cerebellar hemispheres are on unremarkable. The ventricular volumes appear normal. No evidence for acute intracranial hemorrhage, infarct or mass. Fluid level identified within the sphenoid sinus. The skull is intact.  IMPRESSION: 1. No acute intracranial abnormalities. 2. 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 In light of the patient's current symptoms, a more definitive workup with contrast enhanced MRI of the brain may be helpful.   Electronically Signed   By: Signa Kellaylor  Stroud M.D.   On: 08/08/2014 13:48     EKG Interpretation   Date/Time:  Wednesday August 08 2014 12:30:42 EDT Ventricular Rate:  73 PR Interval:  158 QRS Duration: 88 QT Interval:  434 QTC Calculation: 478 R Axis:   109 Text Interpretation:  Sinus rhythm Right axis deviation Nonspecific repol  abnormality, diffuse leads Confirmed by Yarelin Reichardt  MD, Reema Chick ([id number]) on  08/08/2014 1:54:42 PM      MDM   Final diagnoses:  Shaking spells  Hypokalemia    Patient presents with tremors are worse at night.  Symptoms have been ongoing and worsening for the last 2 months. She is nontoxic and nonfocal on exam. Mild decreased sensation over the bilateral feet and resting tremor.  No  recent illnesses. Workup is only notable for mild hypokalemia. CT head does show nonspecific area of low attenuation which could be related to a prior infarct. Given that she is nonfocal and has no focal complaints, the significance of this is unknown at this time. Patient cannot receive an MRI because of pacemaker. Pacemaker was interrogated and shows no events. Discussed with patient that she will need followup as an outpatient with neurology. Patient was ambulatory. She stated understanding with plan.  After history, exam, and medical workup I feel the patient has been appropriately medically screened and is safe for discharge home. Pertinent diagnoses were discussed with the patient. Patient was given return precautions.     Shon Batonourtney F Kemonte Ullman, MD 08/09/14 [phone]

## 2014-08-08 NOTE — Discharge Instructions (Signed)
You were evaluated today for shaking spells.  Your work-up was largely unremarkable.  Your potassium was slightly low at 3.3.  YOu need to follow-up with your PCP for recheck and neurology for further outpatient work-up.  Hypokalemia Hypokalemia means that the amount of potassium in the blood is lower than normal.Potassium is a chemical, called an electrolyte, that helps regulate the amount of fluid in the body. It also stimulates muscle contraction and helps nerves function properly.Most of the body's potassium is inside of cells, and only a very small amount is in the blood. Because the amount in the blood is so small, minor changes can be life-threatening. CAUSES  Antibiotics.  Diarrhea or vomiting.  Using laxatives too much, which can cause diarrhea.  Chronic kidney disease.  Water pills (diuretics).  Eating disorders (bulimia).  Low magnesium level.  Sweating a lot. SIGNS AND SYMPTOMS  Weakness.  Constipation.  Fatigue.  Muscle cramps.  Mental confusion.  Skipped heartbeats or irregular heartbeat (palpitations).  Tingling or numbness. DIAGNOSIS  Your health care provider can diagnose hypokalemia with blood tests. In addition to checking your potassium level, your health care provider may also check other lab tests. TREATMENT Hypokalemia can be treated with potassium supplements taken by mouth or adjustments in your current medicines. If your potassium level is very low, you may need to get potassium through a vein (IV) and be monitored in the hospital. A diet high in potassium is also helpful. Foods high in potassium are:  Nuts, such as peanuts and pistachios.  Seeds, such as sunflower seeds and pumpkin seeds.  Peas, lentils, and lima beans.  Whole grain and bran cereals and breads.  Fresh fruit and vegetables, such as apricots, avocado, bananas, cantaloupe, kiwi, oranges, tomatoes, asparagus, and potatoes.  Orange and tomato juices.  Red meats.  Fruit  yogurt. HOME CARE INSTRUCTIONS  Take all medicines as prescribed by your health care provider.  Maintain a healthy diet by including nutritious food, such as fruits, vegetables, nuts, whole grains, and lean meats.  If you are taking a laxative, be sure to follow the directions on the label. SEEK MEDICAL CARE IF:  Your weakness gets worse.  You feel your heart pounding or racing.  You are vomiting or having diarrhea.  You are diabetic and having trouble keeping your blood glucose in the normal range. SEEK IMMEDIATE MEDICAL CARE IF:  You have chest pain, shortness of breath, or dizziness.  You are vomiting or having diarrhea for more than 2 days.  You faint. MAKE SURE YOU:   Understand these instructions.  Will watch your condition.  Will get help right away if you are not doing well or get worse. Document Released: 10/19/2005 Document Revised: 08/09/2013 Document Reviewed: 04/21/2013 [facility] Patient Information 2015 [employer] [place]. This information is not intended to replace advice given to you by your health care provider. Make sure you discuss any questions you have with your health care provider.

## 2014-08-08 NOTE — ED Notes (Signed)
Bed: [facility]4 Expected date:  Expected time:  Means of arrival:  Comments: EMS - Shaking "all over" past 2 months.

## 2014-08-10 ENCOUNTER — Ambulatory Visit (INDEPENDENT_AMBULATORY_CARE_PROVIDER_SITE_OTHER): Payer: Medicare Other | Admitting: Neurology

## 2014-08-10 ENCOUNTER — Encounter: Payer: Self-pay | Admitting: Neurology

## 2014-08-10 VITALS — BP 123/65 | HR 86 | Wt 168.4 lb

## 2014-08-10 DIAGNOSIS — G9081 Serotonin syndrome: Secondary | ICD-10-CM

## 2014-08-10 DIAGNOSIS — G2579 Other drug induced movement disorders: Secondary | ICD-10-CM

## 2014-08-10 DIAGNOSIS — E119 Type 2 diabetes mellitus without complications: Secondary | ICD-10-CM | POA: Insufficient documentation

## 2014-08-10 DIAGNOSIS — I10 Essential (primary) hypertension: Secondary | ICD-10-CM

## 2014-08-10 DIAGNOSIS — R2689 Other abnormalities of gait and mobility: Secondary | ICD-10-CM | POA: Insufficient documentation

## 2014-08-10 DIAGNOSIS — E538 Deficiency of other specified B group vitamins: Secondary | ICD-10-CM | POA: Insufficient documentation

## 2014-08-10 HISTORY — DX: Serotonin syndrome: G90.81

## 2014-08-10 HISTORY — DX: Other drug induced movement disorders: G25.79

## 2014-08-10 MED ORDER — CLONAZEPAM 0.5 MG PO TABS: 0.2500 mg | ORAL_TABLET | Freq: Two times a day (BID) | ORAL | Status: DC | PRN

## 2014-08-10 NOTE — Patient Instructions (Signed)
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 - please discontinue cymbalta and paxil  - please discontinue Xanax - I prescribed you the clonazepam 0.25mg  twice a day to treat for serotonin syndrome - I will give a call in 7-10 days to see how you doing - give us a call or go to ER if your symptoms getting worse - blood draw today to check A1C, lipid panel and TSH - continue close follow up with your PCP for regular check up - follow up in 2-4 weeks.

## 2014-08-11 DIAGNOSIS — I10 Essential (primary) hypertension: Secondary | ICD-10-CM

## 2014-08-11 HISTORY — DX: Essential (primary) hypertension: I10

## 2014-08-11 LAB — LIPID PANEL
Chol/HDL Ratio: 2.8 ratio units (ref 0.0–4.4)
Cholesterol, Total: 158 mg/dL (ref 100–199)
HDL: 57 mg/dL (ref >39)
LDL Calculated: 74 mg/dL (ref 0–99)
Triglycerides: 135 mg/dL (ref 0–149)
VLDL Cholesterol Cal: 27 mg/dL (ref 5–40)

## 2014-08-11 LAB — HEMOGLOBIN A1C
Est. average glucose Bld gHb Est-mCnc: 126 mg/dL
Hgb A1c MFr Bld: 6 % — ABNORMAL HIGH (ref 4.8–5.6)

## 2014-08-11 LAB — TSH+FREE T4
Free T4: 1.39 ng/dL (ref 0.82–1.77)
TSH: 2.08 u[IU]/mL (ref 0.450–4.500)

## 2014-08-11 NOTE — Progress Notes (Signed)
NEUROLOGY CLINIC NEW PATIENT NOTE  NAME: [name] DOB: 1941/06/28  I saw Bary Richard as a new patient in the neurovascular clinic today regarding  Chief Complaint  Patient presents with  . New Evaluation    rm 3  . Tremors  .  HPI: [name] is a 73 y.o. female with PMH of HTN, DM, sinus node dysfunction s/p acemaker who presents as a new patient for whole body shakiness.   Pt stated that she had a daughter with hx of early onset dementia, multiple strokes, seizure, WM disease, DM, failed kidney transplant, uterus cancer s/p hysterectomy, bedridden. She had been taking care of her for the last 30 years. Daughter eventually died in 2014-03-18 at age of 63. She was depressed and was put on paxil and cymbalta about 3-4 months ago. She was also put on xanax at night for sleep. For the last 6-8 weeks also, she noticed that she stated to have shakiness of her body, gradually getting worse, more hyperactivity and shaky, muscle jumping, feeling of on-the-go. After Xanax, she could sleep for few hours and then woke up in the middle of night, shaking all over. For the last week, it was way worse and she woke up at night feel chest tightness and she afraid of heart attack. She went to ER on 08/08/14 and had CT done which was negative for acute abnormalities and troponin negative. She was told to follow up with neurology as outpt. She denies any weaknss, numbness, LOC, seizure like activity, speech or vision changes.  She has hx of HTN on lisinopril, norvasc, and HCTZ. Her BP today is 123/65. She is also on amaryl for DM. She has FM of dementia. Besides her daughter, her two sisters had dementia and died at their 61s. She denies smoking, alcohol or illicit drugs.  Past Medical History  Diagnosis Date  . DM (diabetes mellitus)   . Dyslipidemia   . Diverticulosis   . Internal and external hemorrhoids without complication   . Arthritis   . Hypertension   . Hyperlipidemia   .  Cardiac arrhythmia     now has pacemaker  . Potassium deficiency   . Fibromyalgia   . Allergy   . Cataract   . GERD (gastroesophageal reflux disease)   . Myocardial infarction    Past Surgical History  Procedure Laterality Date  . Knee surgery      right x 2  . Neck surgery      x2  . Abdominal hysterectomy    . Colonoscopy  multiple  . Pacemaker insertion    . Appendectomy    . Upper gastrointestinal endoscopy    . Bil carple tunnel surgery     Family History  Problem Relation Age of Onset  . Coronary artery disease Father   . Prostate cancer Father   . Colon cancer Father   . Lung cancer Father   . Diabetes Father   . Hypertension Neg Hx   . Esophageal cancer Neg Hx   . Rectal cancer Neg Hx   . Stomach cancer Neg Hx   . Diabetes Daughter   . Dementia Daughter   . Breast cancer Maternal Aunt   . Colon cancer Sister     x 2  . Colon cancer Brother   . Lung cancer Mother   . Dementia Mother   . Colon polyps Sister   . Crohn's disease Sister   . Kidney disease Daughter   . Diabetes  Cousin     multiple  . Diabetes Maternal Aunt     multiple  . Diabetes Maternal Uncle     multiple  . Irritable bowel syndrome Sister    Current Outpatient Prescriptions  Medication Sig Dispense Refill  . ALPRAZolam (XANAX) 1 MG tablet Take 1 mg by mouth at bedtime as needed for sleep.       Marland Kitchen. amLODipine (NORVASC) 2.5 MG tablet Take 2.5 mg by mouth daily.      Marland Kitchen. aspirin 81 MG tablet Take 81 mg by mouth daily.       Marland Kitchen. atorvastatin (LIPITOR) 40 MG tablet Take 40 mg by mouth daily.      . colesevelam (WELCHOL) 625 MG tablet Take 1,875 mg by mouth 2 (two) times daily with a meal.      . DULoxetine (CYMBALTA) 20 MG capsule Take 20 mg by mouth daily.      Marland Kitchen. glimepiride (AMARYL) 4 MG tablet Take 4 mg by mouth 4 (four) times daily -  before meals and at bedtime.       . hydrochlorothiazide (HYDRODIURIL) 25 MG tablet Take 25 mg by mouth daily.      Marland Kitchen. lisinopril (PRINIVIL,ZESTRIL) 40 MG  tablet Take 40 mg by mouth daily.      . pantoprazole (PROTONIX) 40 MG tablet Take 40 mg by mouth 2 (two) times daily before a meal.      . PARoxetine (PAXIL) 20 MG tablet Take 20 mg by mouth daily.       . clonazePAM (KLONOPIN) 0.5 MG tablet Take 0.5 tablets (0.25 mg total) by mouth 2 (two) times daily as needed for anxiety.  30 tablet  0   No current facility-administered medications for this visit.   Allergies  Allergen Reactions  . Caffeine     Nervousness   . Codeine Other (See Comments)    hallucinations  . Metformin And Related     Causes dehydration and causes me to go to the hospital   History   Social History  . Marital Status: Married    Spouse Name: N/A    Number of Children: 2  . Years of Education: 12   Occupational History  . Retired    Social History Main Topics  . Smoking status: Never Smoker   . Smokeless tobacco: Never Used  . Alcohol Use: No  . Drug Use: No  . Sexual Activity: Not on file   Other Topics Concern  . Not on file   Social History Narrative  . No narrative on file    Review of Systems Full 14 system review of systems performed and notable only for those listed, all others are neg:  Constitutional: shivering, fatigue  Cardiovascular: palpitations  Ear/Nose/Throat: N/A  Skin: N/A  Eyes: N/A  Respiratory: SOB  Gastroitestinal: constipation  Hematology/Lymphatic: easy bruising  Endocrine: feeling hot and cold, increased thirst Musculoskeletal: joint pain, arching muscles Allergy/Immunology: N/A  Neurological: confusion, HA, numbness, weakness, slurry speech, dizziness  Psychiatric: anxiety, not enough sleep, decreased energy, insomnia   Physical Exam  Filed Vitals:   08/10/14 1026  BP: 123/65  Pulse: 86    General - Well nourished, well developed, in no acute distress, but more pressured speech.  Ophthalmologic - not able to see through.  Cardiovascular - Regular rate and rhythm with no murmur. Carotid pulses were 2+  without bruits .   Neck - supple, no nuchal rigidity.  Mental Status -  Level of arousal and orientation to time,  place, and person were intact. Language including expression, naming, repetition, comprehension, reading, and writing was assessed and found intact, but pressured speech.  Cranial Nerves II - XII - II - Visual field intact OU. III, IV, VI - Extraocular movements intact. V - Facial sensation intact bilaterally. VII - Facial movement intact bilaterally, with intermittent fasciculations of bilateral cheeks and eyelids.  VIII - Hearing & vestibular intact bilaterally. X - Palate elevates symmetrically. XI - Chin turning & shoulder shrug intact bilaterally. XII - Tongue protrusion intact.  Motor Strength - The patient's strength was normal in all extremities and pronator drift was absent.  Bulk was normal and fasciculations were absent.   Motor Tone - Muscle tone was assessed at the neck and appendages and was normal.  Reflexes - The patient's reflexes were normal in all extremities and she had no pathological reflexes.  Sensory - Light touch, temperature/pinprick were assessed and were normal.    Coordination - The patient had normal movements in the hands and feet with no ataxia or dysmetria.  Physiological tremor present bilateral hands.  Gait and Station - The patient's transfers, posture, gait, station, and turns were observed as normal.   Imaging CT head 08/08/14 -  1. No acute intracranial abnormalities.  2. Nonspecific area of low attenuation at the right white matter  junction the posterior right frontal lobe is likely related to prior  infarct. In light of the patient's current symptoms, a more  definitive workup with contrast enhanced MRI of the brain may be  helpful.  Lab Review CBC, BMP, troponin WNL.    Assessment and Plan:   In summary, Bary RichardMary F Lethco-Moore is a 73 y.o. female with PMH of HTN, DM, sinus node dysfunction s/p acemaker who presents as a  new patient for whole body shakiness. 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 Treatment of serotonin syndrome include stop offending meds and benzo. Xanax is short acting, so I favor long acting clonazepam. Will stop paxil and cymbalta, and add clonazepam.  - discontinue paxil and cymbalta. Due to low dose use, they can be d/c abruptly - also d/c Xanax due to short acting - prescribe clonazepam low dose bid. Wean off to qhs if symptoms controlled. - close follow up and monitoring. If worse, go to ER or call 911 - stroke labs. Low suspicious for stroke at this time. - follow up with PCP too - RTC in 2-4 weeks. Can be seen earlier if needed.     Thank you very much for the opportunity to participate in the care of this patient.  Please do not hesitate to call if any questions or concerns arise.  Orders Placed This Encounter  Procedures  . TSH + free T4  . Lipid panel  . Hemoglobin A1c    Meds ordered this encounter  Medications  . atorvastatin (LIPITOR) 40 MG tablet    Sig: Take 40 mg by mouth daily.  . clonazePAM (KLONOPIN) 0.5 MG tablet    Sig: Take 0.5 tablets (0.25 mg total) by mouth 2 (two) times daily as needed for anxiety.    Dispense:  30 tablet    Refill:  0    Patient Instructions  - serotonin syndrome is the top difference diagnosis on my mind for your symptoms. - please discontinue cymbalta and paxil  - please discontinue Xanax - I prescribed you the clonazepam 0.25mg  twice a day to treat for serotonin syndrome - I  will give a call in 7-10 days to see how you doing - give [place] a call or go to ER if your symptoms getting worse - blood draw today to check A1C, lipid panel and TSH - continue close follow up with your PCP for regular check up - follow up in 2-4 weeks.   Marvel Plan, MD PhD [facility] [address] [place], [place] [address] [phone]

## 2014-08-21 ENCOUNTER — Telehealth: Payer: Self-pay | Admitting: Neurology

## 2014-08-21 NOTE — Telephone Encounter (Signed)
Patient calling for blood work results.  Please call anytime and may leave detailed message on voice mail. °

## 2014-08-22 NOTE — Telephone Encounter (Signed)
Spoke with patient and informed her of lab results, patient verbalized understanding and had no further questions or concerns. 

## 2014-09-03 ENCOUNTER — Ambulatory Visit (INDEPENDENT_AMBULATORY_CARE_PROVIDER_SITE_OTHER): Payer: Medicare Other | Admitting: Neurology

## 2014-09-03 ENCOUNTER — Encounter: Payer: Self-pay | Admitting: Neurology

## 2014-09-03 VITALS — BP 148/81 | HR 85 | Ht 66.0 in | Wt >= 6400 oz

## 2014-09-03 DIAGNOSIS — G2579 Other drug induced movement disorders: Secondary | ICD-10-CM

## 2014-09-03 DIAGNOSIS — I10 Essential (primary) hypertension: Secondary | ICD-10-CM

## 2014-09-03 DIAGNOSIS — E1142 Type 2 diabetes mellitus with diabetic polyneuropathy: Secondary | ICD-10-CM

## 2014-09-03 DIAGNOSIS — G9081 Serotonin syndrome: Secondary | ICD-10-CM

## 2014-09-03 MED ORDER — GABAPENTIN 100 MG PO CAPS: 100.0000 mg | ORAL_CAPSULE | Freq: Three times a day (TID) | ORAL | Status: DC

## 2014-09-03 MED ORDER — CLONAZEPAM 0.5 MG PO TABS: 0.2500 mg | ORAL_TABLET | Freq: Two times a day (BID) | ORAL | Status: DC | PRN

## 2014-09-03 NOTE — Progress Notes (Signed)
NEUROLOGY CLINIC FOLLOW UP PATIENT NOTE  NAME: [name] DOB: 1941-08-09  Referring physician: Dr. Herb Graysammy Spear  History summary: [name] is a 73 y.o. female with PMH of HTN, DM, sinus node dysfunction s/p acemaker who presents on 08/10/14 as a new patient for whole body shakiness. Pt stated that she had a daughter with hx of early onset dementia, multiple strokes, seizure, WM disease, DM, failed kidney transplant, uterus cancer s/p hysterectomy, bedridden. She had been taking care of her for the last 30 years. Daughter eventually died in 03/2014 at age of 71. She was depressed and was put on paxil and cymbalta about 3-4 months ago. She was also put on xanax at night for sleep. For the last 6-8 weeks also, she noticed that she stated to have shakiness of her body, gradually getting worse, more hyperactivity and shaky, muscle jumping, feeling of on-the-go. After Xanax, she could sleep for few hours and then woke up in the middle of night, shaking all over. For the last week, it was way worse and she woke up at night feel chest tightness and she afraid of heart attack. She went to ER on 08/08/14 and had CT done which was negative for acute abnormalities and troponin negative. She was told to follow up with neurology as outpt. She denies any weaknss, numbness, LOC, seizure like activity, speech or vision changes.  She has hx of HTN on lisinopril, norvasc, and HCTZ. Her BP today is 123/65. She is also on amaryl for DM. She has FM of dementia. Besides her daughter, her two sisters had dementia and died at their 560s. She denies smoking, alcohol or illicit drugs.  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 Her paxil and cymbalta were stopped along with Xanax, and I put her on clonazepam for treatment.   Interval history: During the interval time, she was doing well. The shakiness of body was gone, she felt more relaxed and no more muscle jumping. She was able to sleep well. She tolerated  clonazepam well and stated it helped her anxiety too. Her blood testing including A1C, LDL and TSH, free T4 were all normal. She stated that she still has intermittent numbness and tingling feelings in her fingers and toes. She has not been on gabapentin before. She stated that her sugar level is not in excellent control, ranging from 160s to 200s but her A1C last time we checked was 6.0, indicating good control. Her BP today is 148/81.    Past Medical History  Diagnosis Date  . DM (diabetes mellitus)   . Dyslipidemia   . Diverticulosis   . Internal and external hemorrhoids without complication   . Arthritis   . Hypertension   . Hyperlipidemia   . Cardiac arrhythmia     now has pacemaker  . Potassium deficiency   . Fibromyalgia   . Allergy   . Cataract   . GERD (gastroesophageal reflux disease)   . Myocardial infarction    Past Surgical History  Procedure Laterality Date  . Knee surgery      right x 2  . Neck surgery      x2  . Abdominal hysterectomy    . Colonoscopy  multiple  . Pacemaker insertion    . Appendectomy    . Upper gastrointestinal endoscopy    . Bil carple tunnel surgery     Family History  Problem Relation Age of Onset  . Coronary artery disease Father   . Prostate cancer Father   .  Colon cancer Father   . Lung cancer Father   . Diabetes Father   . Hypertension Neg Hx   . Esophageal cancer Neg Hx   . Rectal cancer Neg Hx   . Stomach cancer Neg Hx   . Diabetes Daughter   . Dementia Daughter   . Breast cancer Maternal Aunt   . Colon cancer Sister     x 2  . Colon cancer Brother   . Lung cancer Mother   . Dementia Mother   . Colon polyps Sister   . Crohn's disease Sister   . Kidney disease Daughter   . Diabetes Cousin     multiple  . Diabetes Maternal Aunt     multiple  . Diabetes Maternal Uncle     multiple  . Irritable bowel syndrome Sister    Current Outpatient Prescriptions  Medication Sig Dispense Refill  . amLODipine (NORVASC) 2.5 MG  tablet Take 2.5 mg by mouth daily.    Marland Kitchen aspirin 81 MG tablet Take 81 mg by mouth daily.     Marland Kitchen atorvastatin (LIPITOR) 40 MG tablet Take 40 mg by mouth daily.    . clonazePAM (KLONOPIN) 0.5 MG tablet Take 0.5 tablets (0.25 mg total) by mouth 2 (two) times daily as needed for anxiety. 30 tablet 1  . colesevelam (WELCHOL) 625 MG tablet Take 1,875 mg by mouth 2 (two) times daily with a meal.    . glimepiride (AMARYL) 4 MG tablet Take 4 mg by mouth 4 (four) times daily -  before meals and at bedtime.     . hydrochlorothiazide (HYDRODIURIL) 25 MG tablet Take 25 mg by mouth daily.    Marland Kitchen lisinopril (PRINIVIL,ZESTRIL) 40 MG tablet Take 40 mg by mouth daily.    . pantoprazole (PROTONIX) 40 MG tablet Take 40 mg by mouth 2 (two) times daily before a meal.    . gabapentin (NEURONTIN) 100 MG capsule Take 1 capsule (100 mg total) by mouth 3 (three) times daily. 90 capsule 3   No current facility-administered medications for this visit.   Allergies  Allergen Reactions  . Caffeine     Nervousness   . Codeine Other (See Comments)    hallucinations  . Metformin And Related     Causes dehydration and causes me to go to the hospital   History   Social History  . Marital Status: Married    Spouse Name: N/A    Number of Children: 2  . Years of Education: 12   Occupational History  . Retired    Social History Main Topics  . Smoking status: Never Smoker   . Smokeless tobacco: Never Used  . Alcohol Use: No  . Drug Use: No  . Sexual Activity: Not on file   Other Topics Concern  . Not on file   Social History Narrative   Patient is married with 2 children.   Patient is right handed.   Patient has 12 th grade education.   Patient does not drink caffeine.    Review of Systems Full 14 system review of systems performed and notable only for those listed, all others are neg:  Constitutional: shivering, fatigue  Cardiovascular: palpitations  Ear/Nose/Throat: N/A  Skin: N/A  Eyes: N/A    Respiratory: SOB  Gastroitestinal: constipation  Hematology/Lymphatic: easy bruising  Endocrine: feeling hot and cold, increased thirst Musculoskeletal: joint pain, arching muscles Allergy/Immunology: N/A  Neurological: confusion, HA, numbness, weakness, slurry speech, dizziness  Psychiatric: anxiety, not enough sleep, decreased energy, insomnia  Physical Exam  Filed Vitals:   09/03/14 0801  BP: 148/81  Pulse: 85    General - Well nourished, well developed, in no acute distress, no acute distress.  Ophthalmologic - not able to see through.  Cardiovascular - Regular rate and rhythm with no murmur. Carotid pulses were 2+ without bruits .   Neck - supple, no nuchal rigidity.  Mental Status -  Level of arousal and orientation to time, place, and person were intact. Language including expression, naming, repetition, comprehension, reading, and writing was assessed and found intact.  Cranial Nerves II - XII - II - Visual field intact OU. III, IV, VI - Extraocular movements intact. V - Facial sensation intact bilaterally. VII - Facial movement intact bilaterally.  VIII - Hearing & vestibular intact bilaterally. X - Palate elevates symmetrically. XI - Chin turning & shoulder shrug intact bilaterally. XII - Tongue protrusion intact.  Motor Strength - The patient's strength was normal in all extremities and pronator drift was absent.  Bulk was normal and fasciculations were absent.   Motor Tone - Muscle tone was assessed at the neck and appendages and was normal.  Reflexes - The patient's reflexes were normal in all extremities and she had no pathological reflexes.  Sensory - Light touch, temperature/pinprick were assessed and were normal.    Coordination - The patient had normal movements in the hands and feet with no ataxia or dysmetria.   Gait and Station - The patient's transfers, posture, gait, station, and turns were observed as normal.   Imaging CT head 08/08/14 -   1. No acute intracranial abnormalities.  2. Nonspecific area of low attenuation at the right white matter  junction the posterior right frontal lobe is likely related to prior  infarct. In light of the patient's current symptoms, a more  definitive workup with contrast enhanced MRI of the brain may be  helpful.  Lab Review CBC, BMP, troponin WNL.   Component     Latest Ref Rng 08/10/2014  Cholesterol, Total     100 - 199 mg/dL 161  Triglycerides     0 - 149 mg/dL 096  HDL     >04 mg/dL 57  VLDL Cholesterol Cal     5 - 40 mg/dL 27  LDL (calc)     0 - 99 mg/dL 74  Total CHOL/HDL Ratio     0.0 - 4.4 ratio units 2.8  TSH     0.450 - 4.500 uIU/mL 2.080  Free T4     0.82 - 1.77 ng/dL 5.40  Hemoglobin J8J     4.8 - 5.6 % 6.0 (H)  Estimated average glucose      126     Assessment and Plan:   In summary, [name] is a 73 y.o. female with PMH of HTN, DM, sinus node dysfunction s/p acemaker who is followed up in clinic for whole body shakiness. 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 After stopping her paxil and cymbalta as well as Xanax and put on clonazepam, her symptoms resolved. She stated that clonazepam also helped her anxiety. She still has numbness and tingling in her toes and fingertips, consistent with DM neuropathy. will benefit from low dose gabapentin.   - Continue clonazepam low dose bid for anxiety. - add low dose gabapentin for DM neuropathy, 100mg  tid. - follow up with PCP for stroke risk factor modification. - RTC PRN.  Meds ordered this encounter  Medications  . gabapentin (NEURONTIN) 100 MG capsule    Sig: Take 1 capsule (100 mg total) by mouth 3 (three) times daily.    Dispense:  90 capsule    Refill:  3  . clonazePAM (KLONOPIN) 0.5 MG tablet    Sig: Take 0.5 tablets (0.25 mg total) by mouth 2 (two) times daily as needed for anxiety.    Dispense:  30 tablet    Refill:   1    Patient Instructions  - continue the clonazepam for anxiety and serotonin syndrome treatment - prescribed gabapentin for your DM neuropathy - your blood tests all normal range. - your serotonin syndrome resolved but try to avoid serotonin active medications in the future. - Follow up with your primary care physician for stroke risk factor modification. Recommend maintain blood pressure goal <130/80, diabetes with hemoglobin A1c goal below 6.5% and lipids with LDL cholesterol goal below 70 mg/dL.  - follow up as needed.    Marvel PlanJindong Irisha Grandmaison, MD PhD [facility] [address], [address] [place], [place] [id number] [phone]

## 2014-09-03 NOTE — Patient Instructions (Signed)
-   continue the clonazepam for anxiety and serotonin syndrome treatment - prescribed gabapentin for your DM neuropathy - your blood tests all normal range. - your serotonin syndrome resolved but try to avoid serotonin active medications in the future. - Follow up with your primary care physician for stroke risk factor modification. Recommend maintain blood pressure goal <130/80, diabetes with hemoglobin A1c goal below 6.5% and lipids with LDL cholesterol goal below 70 mg/dL.  - follow up as needed.

## 2014-09-05 ENCOUNTER — Other Ambulatory Visit: Payer: Self-pay

## 2014-09-05 MED ORDER — PANTOPRAZOLE SODIUM 40 MG PO TBEC
40.0000 mg | DELAYED_RELEASE_TABLET | Freq: Two times a day (BID) | ORAL | Status: AC
Start: 2014-09-05 — End: ?

## 2014-09-10 ENCOUNTER — Encounter (HOSPITAL_COMMUNITY): Payer: Self-pay | Admitting: Emergency Medicine

## 2014-09-10 ENCOUNTER — Emergency Department (HOSPITAL_COMMUNITY)
Admission: EM | Admit: 2014-09-10 | Discharge: 2014-09-10 | Disposition: A | Discharge status: Home / Self Care | Payer: Medicare Other | Attending: Emergency Medicine | Admitting: Emergency Medicine

## 2014-09-10 DIAGNOSIS — K219 Gastro-esophageal reflux disease without esophagitis: Secondary | ICD-10-CM | POA: Insufficient documentation

## 2014-09-10 DIAGNOSIS — M1990 Unspecified osteoarthritis, unspecified site: Secondary | ICD-10-CM | POA: Diagnosis not present

## 2014-09-10 DIAGNOSIS — E119 Type 2 diabetes mellitus without complications: Secondary | ICD-10-CM | POA: Diagnosis not present

## 2014-09-10 DIAGNOSIS — M79662 Pain in left lower leg: Secondary | ICD-10-CM | POA: Diagnosis present

## 2014-09-10 DIAGNOSIS — Z79899 Other long term (current) drug therapy: Secondary | ICD-10-CM | POA: Insufficient documentation

## 2014-09-10 DIAGNOSIS — I10 Essential (primary) hypertension: Secondary | ICD-10-CM | POA: Insufficient documentation

## 2014-09-10 DIAGNOSIS — H269 Unspecified cataract: Secondary | ICD-10-CM | POA: Diagnosis not present

## 2014-09-10 DIAGNOSIS — I252 Old myocardial infarction: Secondary | ICD-10-CM | POA: Insufficient documentation

## 2014-09-10 DIAGNOSIS — E785 Hyperlipidemia, unspecified: Secondary | ICD-10-CM | POA: Diagnosis not present

## 2014-09-10 DIAGNOSIS — M79605 Pain in left leg: Secondary | ICD-10-CM

## 2014-09-10 DIAGNOSIS — Z7982 Long term (current) use of aspirin: Secondary | ICD-10-CM | POA: Diagnosis not present

## 2014-09-10 LAB — BASIC METABOLIC PANEL
Anion gap: 13 (ref 5–15)
BUN: 30 mg/dL — ABNORMAL HIGH (ref 6–23)
CO2: 26 mEq/L (ref 19–32)
Calcium: 9.7 mg/dL (ref 8.4–10.5)
Chloride: 103 mEq/L (ref 96–112)
Creatinine, Ser: 0.8 mg/dL (ref 0.50–1.10)
GFR calc Af Amer: 84 mL/min — ABNORMAL LOW (ref >90)
GFR calc non Af Amer: 72 mL/min — ABNORMAL LOW (ref >90)
Glucose, Bld: 103 mg/dL — ABNORMAL HIGH (ref 70–99)
Potassium: 3.9 mEq/L (ref 3.7–5.3)
Sodium: 142 mEq/L (ref 137–147)

## 2014-09-10 LAB — URINALYSIS, ROUTINE W REFLEX MICROSCOPIC
Bilirubin Urine: NEGATIVE
Glucose, UA: NEGATIVE mg/dL
Hgb urine dipstick: NEGATIVE
Ketones, ur: NEGATIVE mg/dL
Nitrite: NEGATIVE
Protein, ur: NEGATIVE mg/dL
Specific Gravity, Urine: 1.028 (ref 1.005–1.030)
Urobilinogen, UA: 1 mg/dL (ref 0.0–1.0)
pH: 7 (ref 5.0–8.0)

## 2014-09-10 LAB — URINE MICROSCOPIC-ADD ON

## 2014-09-10 LAB — D-DIMER, QUANTITATIVE (NOT AT ARMC): D-Dimer, Quant: 0.42 ug/mL-FEU (ref 0.00–0.48)

## 2014-09-10 MED ORDER — HYDROCODONE-ACETAMINOPHEN 5-325 MG PO TABS
2.0000 | ORAL_TABLET | ORAL | Status: DC | PRN
Start: 2014-09-10 — End: 2014-11-13

## 2014-09-10 MED ORDER — NAPROXEN 500 MG PO TABS: 500.0000 mg | ORAL_TABLET | Freq: Two times a day (BID) | ORAL | Status: DC

## 2014-09-10 NOTE — ED Notes (Addendum)
Pt A+Ox4, presents from home with c/o L leg pain, pt reports recent dx serotonin syndrome x3-4 weeks ago and had BLE pain and numbness at that time which had resolved but pt reports has been returning. "My toes feel like they are growing together".  Pt also reports mild R arm numbness which occurred the last time as well.  2/10 pain at rest, 10/10 pain when attempting to stand.  MAEI, +csm/+pulses, =hand grasp, tongue midline, speech clear, no facial asymmetry, no pronator drift.  Skin PWD.  Speaking full/clear sentences, rr even/un-lab.  NAD.

## 2014-09-10 NOTE — Discharge Instructions (Signed)
Your testing has been normal -= this pain should Improve over the next couple of days, keep your leg elevated, use the medications exactly as prescribed, return to the hospital for severe or worsening pain or if you develop redness, swelling or fevers.  Please call your doctor for a followup appointment within 24-48 hours. When you talk to your doctor please let them know that you were seen in the emergency department and have them acquire all of your records so that they can discuss the findings with you and formulate a treatment plan to fully care for your new and ongoing problems.

## 2014-09-10 NOTE — ED Provider Notes (Signed)
CSN: [id number]     Arrival date & time 09/10/14  1304 History   First MD Initiated Contact with Patient 09/10/14 1456     Chief Complaint  Patient presents with  . Leg Pain    also mild numbness, recent dx serotonin syndrome and thinks it's returning     (Consider location/radiation/quality/duration/timing/severity/associated sxs/prior Treatment) HPI Comments: Pt recently dx with SS after SSRI meds started (these were stopped a month ago), started on low dose Klonopin, also neurontin - doing well, SS resolved and now pt awoke today with pain in the L leg -s tarted in the foot and radiated up the leg laterally on the L.  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  She has some Urinary difficulties chronically and has had nausea daily for over 2 months.  She is unsure why.  W/u i the hospital showed slight hypokalemia in past.  She has no swelling of legs, no injury, no hx of CA and no estrogens.  Low risk for DVT.  No pain meds pta.  Patient is a 73 y.o. female presenting with leg pain. The history is provided by the patient and medical records.  Leg Pain   Past Medical History  Diagnosis Date  . DM (diabetes mellitus)   . Dyslipidemia   . Diverticulosis   . Internal and external hemorrhoids without complication   . Arthritis   . Hypertension   . Hyperlipidemia   . Cardiac arrhythmia     now has pacemaker  . Potassium deficiency   . Fibromyalgia   . Allergy   . Cataract   . GERD (gastroesophageal reflux disease)   . Myocardial infarction    Past Surgical History  Procedure Laterality Date  . Knee surgery      right x 2  . Neck surgery      x2  . Abdominal hysterectomy    . Colonoscopy  multiple  . Pacemaker insertion    . Appendectomy    . Upper gastrointestinal endoscopy    . Bil carple tunnel surgery     Family History  Problem Relation Age of Onset  .  Coronary artery disease Father   . Prostate cancer Father   . Colon cancer Father   . Lung cancer Father   . Diabetes Father   . Hypertension Neg Hx   . Esophageal cancer Neg Hx   . Rectal cancer Neg Hx   . Stomach cancer Neg Hx   . Diabetes Daughter   . Dementia Daughter   . Breast cancer Maternal Aunt   . Colon cancer Sister     x 2  . Colon cancer Brother   . Lung cancer Mother   . Dementia Mother   . Colon polyps Sister   . Crohn's disease Sister   . Kidney disease Daughter   . Diabetes Cousin     multiple  . Diabetes Maternal Aunt     multiple  . Diabetes Maternal Uncle     multiple  . Irritable bowel syndrome Sister    History  Substance Use Topics  . Smoking status: Never Smoker   . Smokeless tobacco: Never Used  . Alcohol Use: No   OB History    Gravida Para Term Preterm AB TAB SAB Ectopic Multiple Living   2 2 2       2      Review of Systems  All other systems reviewed and are negative.     Allergies  Caffeine; Codeine; and Metformin and related  Home Medications   Prior to Admission medications   Medication Sig Start Date End Date Taking? Authorizing Provider  amLODipine (NORVASC) 2.5 MG tablet Take 2.5 mg by mouth daily.   Yes Historical Provider, MD  aspirin 81 MG tablet Take 81 mg by mouth daily.    Yes Historical Provider, MD  atorvastatin (LIPITOR) 40 MG tablet Take 40 mg by mouth daily. 07/25/14  Yes Historical Provider, MD  clonazePAM (KLONOPIN) 0.5 MG tablet Take 0.5 tablets (0.25 mg total) by mouth 2 (two) times daily as needed for anxiety. 09/03/14  Yes Marvel PlanJindong Xu, MD  colesevelam (WELCHOL) 625 MG tablet Take 1,875 mg by mouth 2 (two) times daily with a meal.   Yes Historical Provider, MD  gabapentin (NEURONTIN) 100 MG capsule Take 1 capsule (100 mg total) by mouth 3 (three) times daily. 09/03/14  Yes Marvel PlanJindong Xu, MD  glimepiride (AMARYL) 4 MG tablet Take 4 mg by mouth 4 (four) times daily -  before meals and at bedtime.    Yes Historical  Provider, MD  hydrochlorothiazide (HYDRODIURIL) 25 MG tablet Take 25 mg by mouth daily.   Yes Historical Provider, MD  lisinopril (PRINIVIL,ZESTRIL) 40 MG tablet Take 40 mg by mouth daily.   Yes Historical Provider, MD  pantoprazole (PROTONIX) 40 MG tablet Take 1 tablet (40 mg total) by mouth 2 (two) times daily before a meal. 09/05/14  Yes Iva Booparl E Gessner, MD  HYDROcodone-acetaminophen (NORCO/VICODIN) 5-325 MG per tablet Take 2 tablets by mouth every 4 (four) hours as needed. 09/10/14   Vida RollerBrian D Lawrence Roldan, MD  naproxen (NAPROSYN) 500 MG tablet Take 1 tablet (500 mg total) by mouth 2 (two) times daily with a meal. 09/10/14   Vida RollerBrian D Jacquelynn Friend, MD   BP 140/68 mmHg  Pulse 69  Temp(Src) 97.9 F (36.6 C) (Oral)  Resp 16  Ht 5\' 6"  (1.676 m)  Wt 170 lb (77.111 kg)  BMI 27.45 kg/m2  SpO2 100% Physical Exam  Constitutional: She appears well-developed and well-nourished. No distress.  HENT:  Head: Normocephalic and atraumatic.  Mouth/Throat: Oropharynx is clear and moist. No oropharyngeal exudate.  Eyes: Conjunctivae and EOM are normal. Pupils are equal, round, and reactive to light. Right eye exhibits no discharge. Left eye exhibits no discharge. No scleral icterus.  Neck: Normal range of motion. Neck supple. No JVD present. No thyromegaly present.  Cardiovascular: Normal rate, regular rhythm, normal heart sounds and intact distal pulses.  Exam reveals no gallop and no friction rub.   No murmur heard. Pulmonary/Chest: Effort normal and breath sounds normal. No respiratory distress. She has no wheezes. She has no rales.  Abdominal: Soft. Bowel sounds are normal. She exhibits no distension and no mass. There is no tenderness.  Musculoskeletal: Normal range of motion. She exhibits tenderness ( pain with forced and passive dorsi-flexion of the L foot - shoots pain up the L leg laterally - also ttp in the L lateral calf.  no swelling, redness, warmth - compartments soft.). She exhibits no edema.  All other  joints supple and compartments soft.  Lymphadenopathy:    She has no cervical adenopathy.  Neurological: She is alert. Coordination normal.  Normal strength at the hips, knees and ankles bilaterally, normal sensation to light touch bialteral legs.  CN 3-12 inTACT.  Skin: Skin is warm and dry. No rash noted. No erythema.  Psychiatric: She has a normal mood  and affect. Her behavior is normal.  Nursing note and vitals reviewed.   ED Course  Procedures (including critical care time) Labs Review Labs Reviewed  BASIC METABOLIC PANEL - Abnormal; Notable for the following:    Glucose, Bld 103 (*)    BUN 30 (*)    GFR calc non Af Amer 72 (*)    GFR calc Af Amer 84 (*)    All other components within normal limits  URINALYSIS, ROUTINE W REFLEX MICROSCOPIC - Abnormal; Notable for the following:    Leukocytes, UA SMALL (*)    All other components within normal limits  URINE MICROSCOPIC-ADD ON - Abnormal; Notable for the following:    Squamous Epithelial / LPF FEW (*)    Bacteria, UA FEW (*)    All other components within normal limits  D-DIMER, QUANTITATIVE    Imaging Review No results found.    MDM   Final diagnoses:  Leg pain, inferior, left    Pt is benign in appearance - low risk for DVT, no obvious answer to pt's symptoms - possible pinched nerve, doubt frx in absence of injury, no signs of infection, well appearing,  Filed Vitals:   09/10/14 1312  BP: 140/68  Pulse: 69  Temp: 97.9 F (36.6 C)  Resp: 16     Labs unremarkable including urinalysis and metabolic panel and d-dimer. Vital signs normal, we will give the patient pain medication including anti-inflammatory and Rice therapy, they are in agreement, stable for discharge.  Meds given in ED:  Medications - No data to display  New Prescriptions   HYDROCODONE-ACETAMINOPHEN (NORCO/VICODIN) 5-325 MG PER TABLET    Take 2 tablets by mouth every 4 (four) hours as needed.   NAPROXEN (NAPROSYN) 500 MG TABLET    Take 1  tablet (500 mg total) by mouth 2 (two) times daily with a meal.      Vida RollerBrian D Will Schier, MD 09/10/14 [phone]

## 2014-10-08 ENCOUNTER — Encounter: Payer: Medicare Other | Admitting: *Deleted

## 2014-10-08 ENCOUNTER — Ambulatory Visit (INDEPENDENT_AMBULATORY_CARE_PROVIDER_SITE_OTHER): Payer: Medicare Other | Admitting: *Deleted

## 2014-10-08 DIAGNOSIS — I495 Sick sinus syndrome: Secondary | ICD-10-CM

## 2014-10-08 LAB — MDC_IDC_ENUM_SESS_TYPE_INCLINIC
Battery Impedance: 2234 Ohm
Battery Remaining Longevity: 23 mo
Battery Voltage: 2.72 V
Brady Statistic AP VP Percent: 0 %
Brady Statistic AP VS Percent: 78 %
Brady Statistic AS VP Percent: 0 %
Brady Statistic AS VS Percent: 22 %
Date Time Interrogation Session: 20151207144010
Lead Channel Impedance Value: 472 Ohm
Lead Channel Impedance Value: 580 Ohm
Lead Channel Pacing Threshold Amplitude: 0.75 V
Lead Channel Pacing Threshold Amplitude: 0.75 V
Lead Channel Pacing Threshold Pulse Width: 0.4 ms
Lead Channel Pacing Threshold Pulse Width: 0.4 ms
Lead Channel Sensing Intrinsic Amplitude: 22.4 mV
Lead Channel Setting Pacing Amplitude: 2 V
Lead Channel Setting Pacing Amplitude: 2.5 V
Lead Channel Setting Pacing Pulse Width: 0.4 ms
Lead Channel Setting Sensing Sensitivity: 5.6 mV

## 2014-10-10 NOTE — Progress Notes (Signed)
Pacemaker check in clinic. Normal device function. Thresholds, sensing, impedances consistent with previous measurements. Device programmed to maximize longevity. 6 mode switches--all less than 1 minute. No high ventricular rates noted. Device programmed at appropriate safety margins. Histogram distribution appropriate for patient activity level. Device programmed to optimize intrinsic conduction. Estimated longevity 23 months with range 10 to 35 months. Patient enrolled in remote follow-up with [facility] and return kit ordered for old monitor. ROV 11-13-14 @ 945 with GT.

## 2014-10-11 ENCOUNTER — Telehealth: Payer: Self-pay | Admitting: Neurology

## 2014-10-11 DIAGNOSIS — G9081 Serotonin syndrome: Secondary | ICD-10-CM

## 2014-10-11 DIAGNOSIS — G2579 Other drug induced movement disorders: Secondary | ICD-10-CM

## 2014-10-11 NOTE — Telephone Encounter (Signed)
Dr [name] wrote this Rx on 11/2 for 30 days plus one additional refill.  I called the pharmacy.  Spoke with Barnes & NobleErin.  She said the patient still has one refil on file.  I called the patient back, got no answer.  Left message.

## 2014-10-11 NOTE — Telephone Encounter (Signed)
Pt is calling regarding clonazePAM (KLONOPIN) 0.5 MG tablet.  She states that her PCP will not give it to her and wants to know if Dr. [name] will give it to her.  Please call and advis.  She can be reached @ [phone].

## 2014-10-19 ENCOUNTER — Encounter: Payer: Self-pay | Admitting: Internal Medicine

## 2014-10-26 ENCOUNTER — Other Ambulatory Visit: Payer: Self-pay | Admitting: Neurology

## 2014-10-29 ENCOUNTER — Other Ambulatory Visit: Payer: Self-pay

## 2014-10-29 DIAGNOSIS — G2579 Other drug induced movement disorders: Secondary | ICD-10-CM

## 2014-10-29 MED ORDER — CLONAZEPAM 0.5 MG PO TABS
0.2500 mg | ORAL_TABLET | Freq: Two times a day (BID) | ORAL | Status: DC | PRN
Start: 2014-10-29 — End: 2014-11-13

## 2014-10-29 NOTE — Telephone Encounter (Signed)
Dr [name] is out of the office.  [facility]

## 2014-10-30 NOTE — Telephone Encounter (Signed)
Rx signed and faxed.

## 2014-11-05 ENCOUNTER — Emergency Department (HOSPITAL_COMMUNITY)
Admission: EM | Admit: 2014-11-05 | Discharge: 2014-11-05 | Disposition: A | Discharge status: Home / Self Care | Payer: Medicare Other | Attending: Emergency Medicine | Admitting: Emergency Medicine

## 2014-11-05 ENCOUNTER — Encounter (HOSPITAL_COMMUNITY): Payer: Self-pay | Admitting: Emergency Medicine

## 2014-11-05 DIAGNOSIS — M1990 Unspecified osteoarthritis, unspecified site: Secondary | ICD-10-CM | POA: Diagnosis not present

## 2014-11-05 DIAGNOSIS — M4806 Spinal stenosis, lumbar region: Secondary | ICD-10-CM | POA: Insufficient documentation

## 2014-11-05 DIAGNOSIS — Z791 Long term (current) use of non-steroidal anti-inflammatories (NSAID): Secondary | ICD-10-CM | POA: Insufficient documentation

## 2014-11-05 DIAGNOSIS — Z7982 Long term (current) use of aspirin: Secondary | ICD-10-CM | POA: Diagnosis not present

## 2014-11-05 DIAGNOSIS — H269 Unspecified cataract: Secondary | ICD-10-CM | POA: Insufficient documentation

## 2014-11-05 DIAGNOSIS — I10 Essential (primary) hypertension: Secondary | ICD-10-CM | POA: Insufficient documentation

## 2014-11-05 DIAGNOSIS — E785 Hyperlipidemia, unspecified: Secondary | ICD-10-CM | POA: Diagnosis not present

## 2014-11-05 DIAGNOSIS — M797 Fibromyalgia: Secondary | ICD-10-CM | POA: Insufficient documentation

## 2014-11-05 DIAGNOSIS — Z950 Presence of cardiac pacemaker: Secondary | ICD-10-CM | POA: Diagnosis not present

## 2014-11-05 DIAGNOSIS — Z79899 Other long term (current) drug therapy: Secondary | ICD-10-CM | POA: Insufficient documentation

## 2014-11-05 DIAGNOSIS — K219 Gastro-esophageal reflux disease without esophagitis: Secondary | ICD-10-CM | POA: Diagnosis not present

## 2014-11-05 DIAGNOSIS — M48061 Spinal stenosis, lumbar region without neurogenic claudication: Secondary | ICD-10-CM

## 2014-11-05 DIAGNOSIS — E119 Type 2 diabetes mellitus without complications: Secondary | ICD-10-CM | POA: Insufficient documentation

## 2014-11-05 DIAGNOSIS — M545 Low back pain: Secondary | ICD-10-CM | POA: Diagnosis present

## 2014-11-05 DIAGNOSIS — I252 Old myocardial infarction: Secondary | ICD-10-CM | POA: Insufficient documentation

## 2014-11-05 MED ORDER — OXYCODONE-ACETAMINOPHEN 5-325 MG PO TABS: 1.0000 | ORAL_TABLET | ORAL | Status: DC | PRN

## 2014-11-05 MED ORDER — HYDROMORPHONE HCL 1 MG/ML IJ SOLN
1.0000 mg | Freq: Once | INTRAMUSCULAR | Status: AC
  Administered 2014-11-05: 1 mg via INTRAMUSCULAR
  Filled 2014-11-05: qty 1

## 2014-11-05 MED ORDER — DIAZEPAM 5 MG PO TABS
5.0000 mg | ORAL_TABLET | Freq: Once | ORAL | Status: AC
  Administered 2014-11-05: 5 mg via ORAL
  Filled 2014-11-05: qty 1

## 2014-11-05 MED ORDER — DIAZEPAM 2 MG PO TABS: 2.0000 mg | ORAL_TABLET | ORAL | Status: DC | PRN

## 2014-11-05 NOTE — ED Provider Notes (Signed)
CSN: [id number]     Arrival date & time 11/05/14  1354 History   First MD Initiated Contact with Patient 11/05/14 1515     Chief Complaint  Patient presents with  . Back Pain     (Consider location/radiation/quality/duration/timing/severity/associated sxs/prior Treatment) Patient is a 74 y.o. female presenting with back pain. The history is provided by the patient and the spouse.  Back Pain Location:  Lumbar spine Quality:  Burning Radiates to:  L thigh Pain severity:  Severe Pain is:  Same all the time Onset quality:  Sudden Duration:  6 days Timing:  Constant Progression:  Worsening Chronicity:  Recurrent Context: lifting heavy objects and twisting   Relieved by:  Nothing Worsened by:  Ambulation Ineffective treatments:  None tried Associated symptoms: paresthesias   Associated symptoms: no abdominal pain, no bladder incontinence, no bowel incontinence, no dysuria, no numbness, no perianal numbness and no weakness    h/o spinal stenosis  Past Medical History  Diagnosis Date  . DM (diabetes mellitus)   . Dyslipidemia   . Diverticulosis   . Internal and external hemorrhoids without complication   . Arthritis   . Hypertension   . Hyperlipidemia   . Cardiac arrhythmia     now has pacemaker  . Potassium deficiency   . Fibromyalgia   . Allergy   . Cataract   . GERD (gastroesophageal reflux disease)   . Myocardial infarction    Past Surgical History  Procedure Laterality Date  . Knee surgery      right x 2  . Neck surgery      x2  . Abdominal hysterectomy    . Colonoscopy  multiple  . Pacemaker insertion    . Appendectomy    . Upper gastrointestinal endoscopy    . Bil carple tunnel surgery     Family History  Problem Relation Age of Onset  . Coronary artery disease Father   . Prostate cancer Father   . Colon cancer Father   . Lung cancer Father   . Diabetes Father   . Hypertension Neg Hx   . Esophageal cancer Neg Hx   . Rectal cancer Neg Hx   . Stomach  cancer Neg Hx   . Diabetes Daughter   . Dementia Daughter   . Breast cancer Maternal Aunt   . Colon cancer Sister     x 2  . Colon cancer Brother   . Lung cancer Mother   . Dementia Mother   . Colon polyps Sister   . Crohn's disease Sister   . Kidney disease Daughter   . Diabetes Cousin     multiple  . Diabetes Maternal Aunt     multiple  . Diabetes Maternal Uncle     multiple  . Irritable bowel syndrome Sister    History  Substance Use Topics  . Smoking status: Never Smoker   . Smokeless tobacco: Never Used  . Alcohol Use: No   OB History    Gravida Para Term Preterm AB TAB SAB Ectopic Multiple Living   Review of Systems  Gastrointestinal: Negative for abdominal pain and bowel incontinence.  Genitourinary: Negative for bladder incontinence and dysuria.  Musculoskeletal: Positive for back pain.  Neurological: Positive for paresthesias. Negative for weakness and numbness.  All other systems reviewed and are negative.     Allergies  Other; Caffeine; Codeine; and Metformin and related  Home Medications   Prior  to Admission medications   Medication Sig Start Date End Date Taking? Authorizing Provider  amLODipine (NORVASC) 2.5 MG tablet Take 2.5 mg by mouth daily.   Yes Historical Provider, MD  aspirin 81 MG tablet Take 81 mg by mouth daily.    Yes Historical Provider, MD  atorvastatin (LIPITOR) 40 MG tablet Take 40 mg by mouth daily. 07/25/14  Yes Historical Provider, MD  clonazePAM (KLONOPIN) 0.5 MG tablet Take 0.5 tablets (0.25 mg total) by mouth 2 (two) times daily as needed for anxiety. 10/29/14  Yes York Spaniel, MD  colesevelam [facility]) 625 MG tablet Take 1,875 mg by mouth 2 (two) times daily with a meal.   Yes Historical Provider, MD  gabapentin (NEURONTIN) 100 MG capsule Take 1 capsule (100 mg total) by mouth 3 (three) times daily. Patient taking differently: Take 200 mg by mouth 3 (three) times daily.  09/03/14  Yes Marvel Plan, MD    glimepiride (AMARYL) 2 MG tablet Take 1 tablet by mouth 3 (three) times daily with meals. And may take a half a tablet = 1mg  a fourth time if blood sugar is still high. 08/16/14  Yes Historical Provider, MD  hydrochlorothiazide (HYDRODIURIL) 25 MG tablet Take 25 mg by mouth daily.   Yes Historical Provider, MD  lisinopril (PRINIVIL,ZESTRIL) 40 MG tablet Take 40 mg by mouth daily.   Yes Historical Provider, MD  pantoprazole (PROTONIX) 40 MG tablet Take 1 tablet (40 mg total) by mouth 2 (two) times daily before a meal. 09/05/14  Yes Iva Boop, MD  HYDROcodone-acetaminophen (NORCO/VICODIN) 5-325 MG per tablet Take 2 tablets by mouth every 4 (four) hours as needed. Patient not taking: Reported on 11/05/2014 09/10/14   Vida Roller, MD  naproxen (NAPROSYN) 500 MG tablet Take 1 tablet (500 mg total) by mouth 2 (two) times daily with a meal. Patient not taking: Reported on 11/05/2014 09/10/14   Vida Roller, MD   BP 128/60 mmHg  Pulse 68  Temp(Src) 97.8 F (36.6 C) (Oral)  Resp 18  SpO2 100% Physical Exam  Constitutional: She is oriented to person, place, and time. She appears well-developed and well-nourished.  Non-toxic appearance. No distress.  HENT:  Head: Normocephalic and atraumatic.  Eyes: Conjunctivae, EOM and lids are normal. Pupils are equal, round, and reactive to light.  Neck: Normal range of motion. Neck supple. No tracheal deviation present. No thyroid mass present.  Cardiovascular: Normal rate, regular rhythm and normal heart sounds.  Exam reveals no gallop.   No murmur heard. Pulmonary/Chest: Effort normal and breath sounds normal. No stridor. No respiratory distress. She has no decreased breath sounds. She has no wheezes. She has no rhonchi. She has no rales.  Abdominal: Soft. Normal appearance and bowel sounds are normal. She exhibits no distension. There is no tenderness. There is no rebound and no CVA tenderness.  Musculoskeletal: Normal range of motion. She exhibits no  edema or tenderness.       Back:  Neurological: She is alert and oriented to person, place, and time. She has normal strength. No cranial nerve deficit or sensory deficit. GCS eye subscore is 4. GCS verbal subscore is 5. GCS motor subscore is 6.  Skin: Skin is warm and dry. No abrasion and no rash noted.  Psychiatric: She has a normal mood and affect. Her speech is normal and behavior is normal.  Nursing note and vitals reviewed.   ED Course  Procedures (including critical care time) Labs Review Labs Reviewed - No data to  display  Imaging Review No results found.   EKG Interpretation None      MDM   Final diagnoses:  None    Patient given medications for her spinal stenosis pain and feels better. No neurological findings at time of discharge. Will be given referral to neurosurgery    Toy Baker, MD 11/05/14 1811

## 2014-11-05 NOTE — ED Notes (Signed)
Pt with Hx of spinal stenosis injured back last Tuesday, c/o 10/10 back pain. Denies any bowel or bladder incontinence.

## 2014-11-05 NOTE — Discharge Instructions (Signed)
Spinal Stenosis Spinal stenosis is an abnormal narrowing of the canals of your spine (vertebrae). CAUSES  Spinal stenosis is caused by areas of bone pushing into the central canals of your vertebrae. This condition can be present at birth (congenital). It also may be caused by arthritic deterioration of your vertebrae (spinal degeneration).  SYMPTOMS   Pain that is generally worse with activities, particularly standing and walking.  Numbness, tingling, hot or cold sensations, weakness, or weariness in your legs.  Frequent episodes of falling.  A foot-slapping gait that leads to muscle weakness. DIAGNOSIS  Spinal stenosis is diagnosed with the use of magnetic resonance imaging (MRI) or computed tomography (CT). TREATMENT  Initial therapy for spinal stenosis focuses on the management of the pain and other symptoms associated with the condition. These therapies include:  Practicing postural changes to lessen pressure on your nerves.  Exercises to strengthen the core of your body.  Loss of excess body weight.  The use of nonsteroidal anti-inflammatory medicines to reduce swelling and inflammation in your nerves. When therapies to manage pain are not successful, surgery to treat spinal stenosis may be recommended. This surgery involves removing excess bone, which puts pressure on your nerve roots. During this surgery (laminectomy), the posterior boney arch (lamina) and excess bone around the facet joints are removed. Document Released: 01/09/2004 Document Revised: 03/05/2014 Document Reviewed: 01/27/2013 Exit[employer] Patient Information 2015 [employer]. This information is not intended to replace advice given to you by your health care provider. Make sure you discuss any questions you have with your health care provider.  

## 2014-11-13 ENCOUNTER — Ambulatory Visit (INDEPENDENT_AMBULATORY_CARE_PROVIDER_SITE_OTHER): Payer: Medicare Other | Admitting: Internal Medicine

## 2014-11-13 ENCOUNTER — Encounter: Payer: Self-pay | Admitting: Internal Medicine

## 2014-11-13 VITALS — BP 108/60 | HR 72 | Ht 66.0 in | Wt 171.8 lb

## 2014-11-13 DIAGNOSIS — I495 Sick sinus syndrome: Secondary | ICD-10-CM

## 2014-11-13 DIAGNOSIS — I10 Essential (primary) hypertension: Secondary | ICD-10-CM

## 2014-11-13 DIAGNOSIS — E119 Type 2 diabetes mellitus without complications: Secondary | ICD-10-CM

## 2014-11-13 DIAGNOSIS — Z950 Presence of cardiac pacemaker: Secondary | ICD-10-CM

## 2014-11-13 LAB — MDC_IDC_ENUM_SESS_TYPE_INCLINIC
Battery Impedance: 2240 Ohm
Battery Remaining Longevity: 22 mo
Battery Voltage: 2.72 V
Brady Statistic AP VP Percent: 0 %
Brady Statistic AP VS Percent: 97 %
Brady Statistic AS VP Percent: 0 %
Brady Statistic AS VS Percent: 3 %
Date Time Interrogation Session: 20160112094127
Lead Channel Impedance Value: 469 Ohm
Lead Channel Impedance Value: 573 Ohm
Lead Channel Pacing Threshold Amplitude: 0.75 V
Lead Channel Pacing Threshold Amplitude: 0.75 V
Lead Channel Pacing Threshold Pulse Width: 0.4 ms
Lead Channel Pacing Threshold Pulse Width: 0.4 ms
Lead Channel Sensing Intrinsic Amplitude: 22.4 mV
Lead Channel Setting Pacing Amplitude: 2 V
Lead Channel Setting Pacing Amplitude: 2.5 V
Lead Channel Setting Pacing Pulse Width: 0.4 ms
Lead Channel Setting Sensing Sensitivity: 5.6 mV

## 2014-11-13 NOTE — Assessment & Plan Note (Signed)
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 Will recheck in several months. 

## 2014-11-13 NOTE — Assessment & Plan Note (Signed)
Her blood pressure is well controlled. She admits to dietary indiscretion. I have asked the patient to reduce her sodium intake.

## 2014-11-13 NOTE — Assessment & Plan Note (Signed)
She admits to some dietary indiscretion. She states that she is going to start cooking her own food.

## 2014-11-13 NOTE — Patient Instructions (Signed)
Your physician wants you to follow-up in: 12 months with Dr. Taylor You will receive a reminder letter in the mail two months in advance. If you don't receive a letter, please call our office to schedule the follow-up appointment.   Remote monitoring is used to monitor your Pacemaker of ICD from home. This monitoring reduces the number of office visits required to check your device to one time per year. It allows us to keep an eye on the functioning of your device to ensure it is working properly. You are scheduled for a device check from home on 02/12/15. You may send your transmission at any time that day. If you have a wireless device, the transmission will be sent automatically. After your physician reviews your transmission, you will receive a postcard with your next transmission date.   

## 2014-11-13 NOTE — Progress Notes (Signed)
HPI Mrs. Ungerer returns today for followup. She is a very pleasant 74 year old woman with hypertension and sinus node dysfunction, status post permanent pacemaker insertion. She denies shortness of breath or syncope. At nighttime, she has minimal peripheral edema. She admits to dietary indiscretion. Allergies  Allergen Reactions  . Other     Pt has a recent bout of Seratonin-Syndrome so patient is unable to take certain medications anymore. Cymbalta, Paxil, Trazodone  . Caffeine     Nervousness   . Codeine Other (See Comments)    hallucinations  . Metformin And Related     Causes dehydration and causes me to go to the hospital     Current Outpatient Prescriptions  Medication Sig Dispense Refill  . amLODipine (NORVASC) 2.5 MG tablet Take 2.5 mg by mouth daily.    Marland Kitchen aspirin 81 MG tablet Take 81 mg by mouth daily.     Marland Kitchen atorvastatin (LIPITOR) 40 MG tablet Take 40 mg by mouth daily.    . colesevelam (WELCHOL) 625 MG tablet Take 1,875 mg by mouth 2 (two) times daily with a meal.    . gabapentin (NEURONTIN) 100 MG capsule Take 1 capsule (100 mg total) by mouth 3 (three) times daily. (Patient taking differently: Take 200 mg by mouth 3 (three) times daily. ) 90 capsule 3  . glimepiride (AMARYL) 2 MG tablet Take 1 tablet by mouth 3 (three) times daily with meals. And may take a half a tablet =  a fourth time if blood sugar is still high.  3  . hydrochlorothiazide (HYDRODIURIL) 25 MG tablet Take 25 mg by mouth daily.    Marland Kitchen lisinopril (PRINIVIL,ZESTRIL) 40 MG tablet Take 40 mg by mouth daily.    . pantoprazole (PROTONIX) 40 MG tablet Take 1 tablet (40 mg total) by mouth 2 (two) times daily before a meal. 60 tablet 2   No current facility-administered medications for this visit.     Past Medical History  Diagnosis Date  . DM (diabetes mellitus)   . Dyslipidemia   . Diverticulosis   . Internal and external hemorrhoids without complication   . Arthritis   . Hypertension   .  Hyperlipidemia   . Cardiac arrhythmia     now has pacemaker  . Potassium deficiency   . Fibromyalgia   . Allergy   . Cataract   . GERD (gastroesophageal reflux disease)   . Myocardial infarction     ROS:   All systems reviewed and negative except as noted in the HPI.   Past Surgical History  Procedure Laterality Date  . Knee surgery      right x 2  . Neck surgery      x2  . Abdominal hysterectomy    . Colonoscopy  multiple  . Pacemaker insertion    . Appendectomy    . Upper gastrointestinal endoscopy    . Bil carple tunnel surgery       Family History  Problem Relation Age of Onset  . Coronary artery disease Father   . Prostate cancer Father   . Colon cancer Father   . Lung cancer Father   . Diabetes Father   . Hypertension Neg Hx   . Esophageal cancer Neg Hx   . Rectal cancer Neg Hx   . Stomach cancer Neg Hx   . Diabetes Daughter   . Dementia Daughter   . Breast cancer Maternal Aunt   . Colon cancer Sister     x 2  . Colon cancer Brother   .  Lung cancer Mother   . Dementia Mother   . Colon polyps Sister   . Crohn's disease Sister   . Kidney disease Daughter   . Diabetes Cousin     multiple  . Diabetes Maternal Aunt     multiple  . Diabetes Maternal Uncle     multiple  . Irritable bowel syndrome Sister      History   Social History  . Marital Status: Married    Spouse Name: N/A    Number of Children: 2  . Years of Education: 12   Occupational History  . Retired    Social History Main Topics  . Smoking status: Never Smoker   . Smokeless tobacco: Never Used  . Alcohol Use: No  . Drug Use: No  . Sexual Activity: Not on file   Other Topics Concern  . Not on file   Social History Narrative   Patient is married with 2 children.   Patient is right handed.   Patient has 12 th grade education.   Patient does not drink caffeine.     BP 108/60 mmHg  Pulse 72  Ht 5\' 6"  (1.676 m)  Wt 171 lb 12.8 oz (77.928 kg)  BMI 27.74  kg/m2  Physical Exam:  Well appearing 74 year old woman, NAD HEENT: Unremarkable Neck:  No JVD, no thyromegally Lungs:  Clear with no wheezes, rales, or rhonchi. HEART:  Regular rate rhythm, no murmurs, no rubs, no clicks Abd:  soft, positive bowel sounds, no organomegally, no rebound, no guarding Ext:  2 plus pulses, no edema, no cyanosis, no clubbing Skin:  No rashes no nodules Neuro:  CN II through XII intact, motor grossly intact  DEVICE  Normal device function.  See PaceArt for details.   Assess/Plan:

## 2014-11-22 ENCOUNTER — Encounter: Payer: Self-pay | Admitting: Internal Medicine

## 2015-01-01 ENCOUNTER — Other Ambulatory Visit: Payer: Self-pay | Admitting: Neurology

## 2015-01-23 ENCOUNTER — Telehealth (HOSPITAL_COMMUNITY): Payer: Self-pay | Admitting: Cardiology

## 2015-01-23 NOTE — Telephone Encounter (Signed)
[facility] 03[facility]14[facility]15 with Dr [place].  [facility] Illa LevelJordans schedule on 03/14/15. lp

## 2015-02-12 ENCOUNTER — Ambulatory Visit (INDEPENDENT_AMBULATORY_CARE_PROVIDER_SITE_OTHER): Payer: Medicare Other | Admitting: *Deleted

## 2015-02-12 DIAGNOSIS — I495 Sick sinus syndrome: Secondary | ICD-10-CM | POA: Diagnosis not present

## 2015-02-12 LAB — MDC_IDC_ENUM_SESS_TYPE_REMOTE
Battery Impedance: 2273 Ohm
Battery Remaining Longevity: 22 mo
Battery Voltage: 2.72 V
Brady Statistic AP VP Percent: 0 %
Brady Statistic AP VS Percent: 97 %
Brady Statistic AS VP Percent: 0 %
Brady Statistic AS VS Percent: 3 %
Date Time Interrogation Session: 20160412143445
Lead Channel Impedance Value: 472 Ohm
Lead Channel Impedance Value: 599 Ohm
Lead Channel Pacing Threshold Amplitude: 0.875 V
Lead Channel Pacing Threshold Amplitude: 0.875 V
Lead Channel Pacing Threshold Pulse Width: 0.4 ms
Lead Channel Pacing Threshold Pulse Width: 0.4 ms
Lead Channel Sensing Intrinsic Amplitude: 22.4 mV
Lead Channel Setting Pacing Amplitude: 2 V
Lead Channel Setting Pacing Amplitude: 2.5 V
Lead Channel Setting Pacing Pulse Width: 0.4 ms
Lead Channel Setting Sensing Sensitivity: 5.6 mV

## 2015-02-12 NOTE — Progress Notes (Signed)
Remote pacemaker transmission.   

## 2015-02-25 ENCOUNTER — Other Ambulatory Visit: Payer: Self-pay

## 2015-02-25 ENCOUNTER — Encounter: Payer: Self-pay | Admitting: Cardiology

## 2015-02-25 DIAGNOSIS — Z1231 Encounter for screening mammogram for malignant neoplasm of breast: Secondary | ICD-10-CM

## 2015-02-28 ENCOUNTER — Encounter: Payer: Self-pay | Admitting: Internal Medicine

## 2015-03-02 ENCOUNTER — Other Ambulatory Visit: Payer: Self-pay | Admitting: Neurology

## 2015-03-12 ENCOUNTER — Encounter: Payer: Self-pay | Admitting: Cardiology

## 2015-03-14 ENCOUNTER — Ambulatory Visit
Admission: RE | Admit: 2015-03-14 | Discharge: 2015-03-14 | Disposition: A | Discharge status: Home / Self Care | Payer: Medicare Other | Source: Ambulatory Visit | Attending: Cardiology | Admitting: Cardiology

## 2015-03-14 ENCOUNTER — Ambulatory Visit (INDEPENDENT_AMBULATORY_CARE_PROVIDER_SITE_OTHER): Payer: Medicare Other | Admitting: Cardiology

## 2015-03-14 ENCOUNTER — Encounter: Payer: Self-pay | Admitting: Cardiology

## 2015-03-14 ENCOUNTER — Ambulatory Visit: Payer: Medicare Other | Admitting: Cardiology

## 2015-03-14 VITALS — BP 126/72 | HR 67 | Ht 66.0 in | Wt 172.7 lb

## 2015-03-14 DIAGNOSIS — R072 Precordial pain: Secondary | ICD-10-CM | POA: Diagnosis not present

## 2015-03-14 DIAGNOSIS — I10 Essential (primary) hypertension: Secondary | ICD-10-CM

## 2015-03-14 DIAGNOSIS — R0600 Dyspnea, unspecified: Secondary | ICD-10-CM | POA: Diagnosis not present

## 2015-03-14 DIAGNOSIS — Z950 Presence of cardiac pacemaker: Secondary | ICD-10-CM | POA: Diagnosis not present

## 2015-03-14 DIAGNOSIS — R079 Chest pain, unspecified: Secondary | ICD-10-CM | POA: Insufficient documentation

## 2015-03-14 DIAGNOSIS — R0609 Other forms of dyspnea: Secondary | ICD-10-CM | POA: Insufficient documentation

## 2015-03-14 DIAGNOSIS — R600 Localized edema: Secondary | ICD-10-CM

## 2015-03-14 LAB — LIPID PANEL
Cholesterol: 198 mg/dL (ref 0–200)
HDL: 54 mg/dL (ref >=46)
LDL Cholesterol: 113 mg/dL — ABNORMAL HIGH (ref 0–99)
Total CHOL/HDL Ratio: 3.7 Ratio
Triglycerides: 153 mg/dL — ABNORMAL HIGH (ref <150)
VLDL: 31 mg/dL (ref 0–40)

## 2015-03-14 NOTE — Progress Notes (Signed)
Cardiology Office Note   Date:  03/14/2015   ID:  [name], DOB September 11, 1941, MRN [id number]  PCP:  Brand Malesebra Cobb NP  Cardiologist:   [name], MD   Chief Complaint  Patient presents with  . Establish Care    shirtness of breath and chest discomfort, worse when she tosses and turns in bed      History of Present Illness: [name] is a 74 y.o. female who is seen at the request of Brand MalesDebra Cobb NP for evaluation of dyspnea and chest discomfort. She is followed by Dr. Ladona Ridgelaylor in [place] for sinus node dysfunction and is s/p pacemaker. I saw her last in 2009. She had a normal Echo in 2007 and a normal stress Echo in 2010. Today she presents with a 5-6 month history of dyspnea and fatigue. She complains of dyspnea on exertion and also reports she has to sit up at night to get her breath. She describes discomfort in her chest that is constant. She reports some swelling in her ankles at the end of the day. She states she hates salt. She has DM, HTN, and HL. No dizziness, palpitations, or syncope.     Past Medical History  Diagnosis Date  . DM (diabetes mellitus)   . Diverticulosis   . Internal and external hemorrhoids without complication   . Arthritis   . Hypertension   . Hyperlipidemia   . Cardiac arrhythmia     now has pacemaker  . Potassium deficiency   . Fibromyalgia   . Allergy   . Cataract   . GERD (gastroesophageal reflux disease)   . Myocardial infarction     Past Surgical History  Procedure Laterality Date  . Knee surgery      right x 2  . Neck surgery      x2  . Abdominal hysterectomy    . Colonoscopy  multiple  . Pacemaker insertion    . Appendectomy    . Upper gastrointestinal endoscopy    . Bil carple tunnel surgery       Current Outpatient Prescriptions  Medication Sig Dispense Refill  . amLODipine (NORVASC) 2.5 MG tablet Take 2.5 mg by mouth daily.    Marland Kitchen. aspirin 81 MG tablet Take 81 mg by mouth daily.     Marland Kitchen. atorvastatin (LIPITOR) 40 MG  tablet Take 40 mg by mouth daily.    . colesevelam (WELCHOL) 625 MG tablet Take 1,875 mg by mouth 2 (two) times daily with a meal.    . gabapentin (NEURONTIN) 100 MG capsule TAKE ONE CAPSULE BY MOUTH THREE TIMES DAILY 90 capsule 6  . glimepiride (AMARYL) 2 MG tablet Take 1 tablet by mouth 3 (three) times daily with meals. And may take a half a tablet = 1mg  a fourth time if blood sugar is still high.  3  . hydrochlorothiazide (HYDRODIURIL) 25 MG tablet Take 25 mg by mouth daily.    Marland Kitchen. lisinopril (PRINIVIL,ZESTRIL) 40 MG tablet Take 40 mg by mouth daily.    . pantoprazole (PROTONIX) 40 MG tablet Take 1 tablet (40 mg total) by mouth 2 (two) times daily before a meal. 60 tablet 2  . ONE TOUCH ULTRA TEST test strip   2   No current facility-administered medications for this visit.    Allergies:   Other; Caffeine; Codeine; and Metformin and related    Social History:  The patient  reports that she has never smoked. She has never used smokeless tobacco. She reports that  she does not drink alcohol or use illicit drugs.   Family History:  The patient's family history includes Breast cancer in her maternal aunt; Colon cancer in her brother, father, and sister; Colon polyps in her sister; Coronary artery disease in her father; Crohn's disease in her sister; Dementia in her daughter and mother; Diabetes in her cousin, daughter, father, maternal aunt, and maternal uncle; Heart attack in her brother, daughter, and father; Irritable bowel syndrome in her sister; Kidney disease in her daughter; Lung cancer in her father and mother; Prostate cancer in her father. There is no history of Hypertension, Esophageal cancer, Rectal cancer, or Stomach cancer.    ROS:  Please see the history of present illness.   Otherwise, review of systems are positive for none.   All other systems are reviewed and negative.    PHYSICAL EXAM: VS:  BP 126/72 mmHg  Pulse 67  Ht 5\' 6"  (1.676 m)  Wt 172 lb 11.2 oz (78.336 kg)  BMI  27.89 kg/m2 , BMI Body mass index is 27.89 kg/(m^2). GEN: Well nourished, well developed, in no acute distress HEENT: normal Neck: no JVD, carotid bruits, or masses Cardiac: RRR; no murmurs, rubs, or gallops,no edema  Respiratory:  clear to auscultation bilaterally, normal work of breathing GI: soft, nontender, nondistended, + BS MS: no deformity or atrophy Skin: warm and dry, no rash Neuro:  Strength and sensation are intact Psych: euthymic mood, full affect   EKG:  EKG is ordered today. The ekg ordered today demonstrates NSR with nonspecific TWA. I have personally reviewed and interpreted this study.    Recent Labs: 08/08/2014: ALT 64*; Hemoglobin 13.7; Platelets 222 08/10/2014: TSH 2.080 09/10/2014: BUN 30*; Creatinine 0.80; Potassium 3.9; Sodium 142    Lipid Panel    Component Value Date/Time   CHOL 158 08/10/2014 1142   TRIG 135 08/10/2014 1142   HDL 57 08/10/2014 1142   CHOLHDL 2.8 08/10/2014 1142   LDLCALC 74 08/10/2014 1142      Wt Readings from Last 3 Encounters:  03/14/15 172 lb 11.2 oz (78.336 kg)  11/13/14 171 lb 12.8 oz (77.928 kg)  09/10/14 170 lb (77.111 kg)      Other studies Reviewed: Additional studies/ records that were reviewed today include: records and labs from primary care. Review of the above records demonstrates: normal chemistry panel, CBC, TSH, and Rhem screen.    ASSESSMENT AND PLAN:  1.  Dyspnea on exertion and orthopnea. This may represent diastolic CHF but exam today is fairly unremarkable. Will check CXR, BNP level and schedule for Echo.  2. Chest tightness. Atypical. Patient has multiple cardiac risk factors and is at intermediate risk for CAD. Will schedule for stress Myoview.   3. Sinus node dysfunction with bradycardia. Has pacemaker in place with normal function. Predominantly atrial paced.   4. DM type 2  5. HTN controlled.  6. Hyperlipidemia. Will check lipid panel.    Current medicines are reviewed at length with the  patient today.  The patient does not have concerns regarding medicines.  The following changes have been made:  no change  Labs/ tests ordered today include:  Orders Placed This Encounter  Procedures  . DG Chest 2 View  . Lipid panel  . B Nat Peptide  . Myocardial Perfusion Imaging  . EKG 12-Lead  . Echocardiogram     Disposition:   FU after above studies complete  Signed, [name], MD,FACC 03/14/2015 10:13 AM    [facility] [address], [place], [place], [id number] Phone [phone], Fax [phone]

## 2015-03-14 NOTE — Patient Instructions (Signed)
We will check some additional blood work today and a CXR  We will schedule you for an Echocardiogram and a nuclear stress test.  I will see you following these studies.

## 2015-03-15 ENCOUNTER — Ambulatory Visit
Admission: RE | Admit: 2015-03-15 | Discharge: 2015-03-15 | Disposition: A | Discharge status: Home / Self Care | Payer: Medicare Other | Source: Ambulatory Visit

## 2015-03-15 DIAGNOSIS — Z1231 Encounter for screening mammogram for malignant neoplasm of breast: Secondary | ICD-10-CM

## 2015-03-15 LAB — BRAIN NATRIURETIC PEPTIDE: Brain Natriuretic Peptide: 84.7 pg/mL (ref 0.0–100.0)

## 2015-03-28 ENCOUNTER — Telehealth (HOSPITAL_COMMUNITY): Payer: Self-pay

## 2015-03-28 NOTE — Telephone Encounter (Signed)
Left message on voicemail in reference to upcoming appointment scheduled for 04-02-2015. Phone number given for a call back so details instructions can be given. Makalya Nave A   

## 2015-04-02 ENCOUNTER — Ambulatory Visit (HOSPITAL_BASED_OUTPATIENT_CLINIC_OR_DEPARTMENT_OTHER): Payer: Medicare Other

## 2015-04-02 ENCOUNTER — Ambulatory Visit (HOSPITAL_COMMUNITY)
Admission: RE | Admit: 2015-04-02 | Discharge: 2015-04-02 | Disposition: A | Discharge status: Home / Self Care | Payer: Medicare Other | Source: Ambulatory Visit | Attending: Cardiology | Admitting: Cardiology

## 2015-04-02 DIAGNOSIS — R0602 Shortness of breath: Secondary | ICD-10-CM | POA: Diagnosis present

## 2015-04-02 DIAGNOSIS — Z950 Presence of cardiac pacemaker: Secondary | ICD-10-CM | POA: Diagnosis not present

## 2015-04-02 DIAGNOSIS — E785 Hyperlipidemia, unspecified: Secondary | ICD-10-CM | POA: Insufficient documentation

## 2015-04-02 DIAGNOSIS — R072 Precordial pain: Secondary | ICD-10-CM | POA: Diagnosis not present

## 2015-04-02 DIAGNOSIS — I252 Old myocardial infarction: Secondary | ICD-10-CM | POA: Insufficient documentation

## 2015-04-02 DIAGNOSIS — R0600 Dyspnea, unspecified: Secondary | ICD-10-CM

## 2015-04-02 DIAGNOSIS — I10 Essential (primary) hypertension: Secondary | ICD-10-CM

## 2015-04-02 DIAGNOSIS — R600 Localized edema: Secondary | ICD-10-CM

## 2015-04-02 LAB — MYOCARDIAL PERFUSION IMAGING
Estimated workload: 1 METS
LV dias vol: 54 mL
LV sys vol: 14 mL
Nuc Stress EF: 75 %
Peak HR: 70 {beats}/min
Percent of predicted max HR: 47 %
RATE: 0.37
Rest HR: 60 {beats}/min
SDS: 4
SRS: 0
SSS: 4
Stage 1 DBP: 74 mmHg
Stage 1 Grade: 0 %
Stage 1 HR: 63 {beats}/min
Stage 1 SBP: 121 mmHg
Stage 1 Speed: 0 mph
Stage 2 DBP: 74 mmHg
Stage 2 Grade: 0 %
Stage 2 HR: 61 {beats}/min
Stage 2 SBP: 113 mmHg
Stage 2 Speed: 0 mph
Stage 3 Grade: 0 %
Stage 3 HR: 61 {beats}/min
Stage 3 Speed: 0 mph
Stage 4 DBP: 72 mmHg
Stage 4 Grade: 0 %
Stage 4 HR: 61 {beats}/min
Stage 4 SBP: 137 mmHg
Stage 4 Speed: 0 mph
Stage 5 Grade: 0 %
Stage 5 HR: 70 {beats}/min
Stage 5 Speed: 0 mph
Stage 6 DBP: 70 mmHg
Stage 6 Grade: 0 %
Stage 6 HR: 62 {beats}/min
Stage 6 SBP: 142 mmHg
Stage 6 Speed: 0 mph
Stage 7 DBP: 70 mmHg
Stage 7 Grade: 0 %
Stage 7 HR: 63 {beats}/min
Stage 7 SBP: 131 mmHg
Stage 7 Speed: 0 mph
TID: 0.96

## 2015-04-02 MED ORDER — TECHNETIUM TC 99M SESTAMIBI GENERIC - CARDIOLITE
30.0000 | Freq: Once | INTRAVENOUS | Status: AC | PRN
  Administered 2015-04-02: 30 via INTRAVENOUS

## 2015-04-02 MED ORDER — TECHNETIUM TC 99M SESTAMIBI GENERIC - CARDIOLITE
11.0000 | Freq: Once | INTRAVENOUS | Status: AC | PRN
  Administered 2015-04-02: 11 via INTRAVENOUS

## 2015-04-02 MED ORDER — REGADENOSON 0.4 MG/5ML IV SOLN
0.4000 mg | Freq: Once | INTRAVENOUS | Status: AC
  Administered 2015-04-02: 0.4 mg via INTRAVENOUS

## 2015-04-03 ENCOUNTER — Other Ambulatory Visit: Payer: Self-pay

## 2015-04-03 DIAGNOSIS — R072 Precordial pain: Secondary | ICD-10-CM

## 2015-04-03 DIAGNOSIS — I10 Essential (primary) hypertension: Secondary | ICD-10-CM

## 2015-04-03 DIAGNOSIS — R0600 Dyspnea, unspecified: Secondary | ICD-10-CM

## 2015-04-03 DIAGNOSIS — R9439 Abnormal result of other cardiovascular function study: Secondary | ICD-10-CM

## 2015-04-04 ENCOUNTER — Other Ambulatory Visit: Payer: Self-pay | Admitting: Cardiology

## 2015-04-04 LAB — CBC WITH DIFFERENTIAL/PLATELET
Basophils Absolute: 0 10*3/uL (ref 0.0–0.1)
Basophils Relative: 0 % (ref 0–1)
Eosinophils Absolute: 0.1 10*3/uL (ref 0.0–0.7)
Eosinophils Relative: 2 % (ref 0–5)
HCT: 40.7 % (ref 36.0–46.0)
Hemoglobin: 13.8 g/dL (ref 12.0–15.0)
Lymphocytes Relative: 33 % (ref 12–46)
Lymphs Abs: 1.5 10*3/uL (ref 0.7–4.0)
MCH: 30.4 pg (ref 26.0–34.0)
MCHC: 33.9 g/dL (ref 30.0–36.0)
MCV: 89.6 fL (ref 78.0–100.0)
MPV: 10.6 fL (ref 8.6–12.4)
Monocytes Absolute: 0.4 10*3/uL (ref 0.1–1.0)
Monocytes Relative: 9 % (ref 3–12)
Neutro Abs: 2.6 10*3/uL (ref 1.7–7.7)
Neutrophils Relative %: 56 % (ref 43–77)
Platelets: 254 10*3/uL (ref 150–400)
RBC: 4.54 MIL/uL (ref 3.87–5.11)
RDW: 13.8 % (ref 11.5–15.5)
WBC: 4.6 10*3/uL (ref 4.0–10.5)

## 2015-04-04 LAB — BASIC METABOLIC PANEL
BUN: 19 mg/dL (ref 6–23)
CO2: 28 mEq/L (ref 19–32)
Calcium: 9.1 mg/dL (ref 8.4–10.5)
Chloride: 104 mEq/L (ref 96–112)
Creat: 0.96 mg/dL (ref 0.50–1.10)
Glucose, Bld: 165 mg/dL — ABNORMAL HIGH (ref 70–99)
Potassium: 3.7 mEq/L (ref 3.5–5.3)
Sodium: 143 mEq/L (ref 135–145)

## 2015-04-05 LAB — PROTIME-INR
INR: 1.04 (ref <1.50)
Prothrombin Time: 13.6 seconds (ref 11.6–15.2)

## 2015-04-09 ENCOUNTER — Ambulatory Visit (HOSPITAL_COMMUNITY)
Admission: RE | Admit: 2015-04-09 | Discharge: 2015-04-09 | Disposition: A | Discharge status: Home / Self Care | Payer: Medicare Other | Source: Ambulatory Visit | Attending: Cardiology | Admitting: Cardiology

## 2015-04-09 ENCOUNTER — Other Ambulatory Visit: Payer: Self-pay | Admitting: Cardiology

## 2015-04-09 ENCOUNTER — Encounter (HOSPITAL_COMMUNITY): Payer: Self-pay | Admitting: Cardiology

## 2015-04-09 ENCOUNTER — Encounter (HOSPITAL_COMMUNITY)
Admission: RE | Disposition: A | Discharge status: Home / Self Care | Payer: Self-pay | Source: Ambulatory Visit | Attending: Cardiology

## 2015-04-09 DIAGNOSIS — R931 Abnormal findings on diagnostic imaging of heart and coronary circulation: Secondary | ICD-10-CM

## 2015-04-09 DIAGNOSIS — K648 Other hemorrhoids: Secondary | ICD-10-CM | POA: Insufficient documentation

## 2015-04-09 DIAGNOSIS — E785 Hyperlipidemia, unspecified: Secondary | ICD-10-CM | POA: Diagnosis not present

## 2015-04-09 DIAGNOSIS — I10 Essential (primary) hypertension: Secondary | ICD-10-CM | POA: Diagnosis not present

## 2015-04-09 DIAGNOSIS — Z9049 Acquired absence of other specified parts of digestive tract: Secondary | ICD-10-CM | POA: Diagnosis not present

## 2015-04-09 DIAGNOSIS — Z888 Allergy status to other drugs, medicaments and biological substances status: Secondary | ICD-10-CM | POA: Diagnosis not present

## 2015-04-09 DIAGNOSIS — I252 Old myocardial infarction: Secondary | ICD-10-CM | POA: Insufficient documentation

## 2015-04-09 DIAGNOSIS — M1990 Unspecified osteoarthritis, unspecified site: Secondary | ICD-10-CM | POA: Insufficient documentation

## 2015-04-09 DIAGNOSIS — E119 Type 2 diabetes mellitus without complications: Secondary | ICD-10-CM

## 2015-04-09 DIAGNOSIS — E876 Hypokalemia: Secondary | ICD-10-CM | POA: Insufficient documentation

## 2015-04-09 DIAGNOSIS — K219 Gastro-esophageal reflux disease without esophagitis: Secondary | ICD-10-CM | POA: Diagnosis not present

## 2015-04-09 DIAGNOSIS — Z90710 Acquired absence of both cervix and uterus: Secondary | ICD-10-CM | POA: Insufficient documentation

## 2015-04-09 DIAGNOSIS — R9439 Abnormal result of other cardiovascular function study: Secondary | ICD-10-CM | POA: Diagnosis present

## 2015-04-09 DIAGNOSIS — R0600 Dyspnea, unspecified: Secondary | ICD-10-CM | POA: Diagnosis present

## 2015-04-09 DIAGNOSIS — R0609 Other forms of dyspnea: Secondary | ICD-10-CM | POA: Diagnosis present

## 2015-04-09 DIAGNOSIS — Z886 Allergy status to analgesic agent status: Secondary | ICD-10-CM | POA: Insufficient documentation

## 2015-04-09 DIAGNOSIS — I2510 Atherosclerotic heart disease of native coronary artery without angina pectoris: Secondary | ICD-10-CM | POA: Diagnosis not present

## 2015-04-09 DIAGNOSIS — Z7982 Long term (current) use of aspirin: Secondary | ICD-10-CM | POA: Diagnosis not present

## 2015-04-09 DIAGNOSIS — Z950 Presence of cardiac pacemaker: Secondary | ICD-10-CM | POA: Diagnosis not present

## 2015-04-09 DIAGNOSIS — M797 Fibromyalgia: Secondary | ICD-10-CM | POA: Diagnosis not present

## 2015-04-09 DIAGNOSIS — K5730 Diverticulosis of large intestine without perforation or abscess without bleeding: Secondary | ICD-10-CM | POA: Diagnosis not present

## 2015-04-09 DIAGNOSIS — R079 Chest pain, unspecified: Secondary | ICD-10-CM | POA: Diagnosis present

## 2015-04-09 HISTORY — PX: CARDIAC CATHETERIZATION: SHX172

## 2015-04-09 LAB — GLUCOSE, CAPILLARY: Glucose-Capillary: 114 mg/dL — ABNORMAL HIGH (ref 65–99)

## 2015-04-09 SURGERY — LEFT HEART CATH AND CORONARY ANGIOGRAPHY
Anesthesia: LOCAL

## 2015-04-09 MED ORDER — FENTANYL CITRATE (PF) 100 MCG/2ML IJ SOLN
INTRAMUSCULAR | Status: AC
  Filled 2015-04-09: qty 2

## 2015-04-09 MED ORDER — SODIUM CHLORIDE 0.9 % IJ SOLN: 3.0000 mL | INTRAMUSCULAR | Status: DC | PRN

## 2015-04-09 MED ORDER — MIDAZOLAM HCL 2 MG/2ML IJ SOLN
INTRAMUSCULAR | Status: DC | PRN
  Administered 2015-04-09: 2 mg via INTRAVENOUS

## 2015-04-09 MED ORDER — HEPARIN SODIUM (PORCINE) 1000 UNIT/ML IJ SOLN
INTRAMUSCULAR | Status: DC | PRN
  Administered 2015-04-09: 4000 [IU] via INTRAVENOUS

## 2015-04-09 MED ORDER — VERAPAMIL HCL 2.5 MG/ML IV SOLN
INTRAVENOUS | Status: AC
  Filled 2015-04-09: qty 2

## 2015-04-09 MED ORDER — SODIUM CHLORIDE 0.9 % WEIGHT BASED INFUSION: 1.0000 mL/kg/h | INTRAVENOUS | Status: DC

## 2015-04-09 MED ORDER — LIDOCAINE HCL (PF) 1 % IJ SOLN
INTRAMUSCULAR | Status: AC
  Filled 2015-04-09: qty 30

## 2015-04-09 MED ORDER — SODIUM CHLORIDE 0.9 % WEIGHT BASED INFUSION
3.0000 mL/kg/h | INTRAVENOUS | Status: DC
  Administered 2015-04-09: 3 mL/kg/h via INTRAVENOUS

## 2015-04-09 MED ORDER — NITROGLYCERIN 1 MG/10 ML FOR IR/CATH LAB
INTRA_ARTERIAL | Status: AC
  Filled 2015-04-09: qty 10

## 2015-04-09 MED ORDER — LOSARTAN POTASSIUM 100 MG PO TABS: 100.0000 mg | ORAL_TABLET | Freq: Every day | ORAL | Status: DC

## 2015-04-09 MED ORDER — SODIUM CHLORIDE 0.9 % WEIGHT BASED INFUSION: 3.0000 mL/kg/h | INTRAVENOUS | Status: DC

## 2015-04-09 MED ORDER — ASPIRIN 81 MG PO CHEW
81.0000 mg | CHEWABLE_TABLET | ORAL | Status: AC
  Administered 2015-04-09: 81 mg via ORAL

## 2015-04-09 MED ORDER — SODIUM CHLORIDE 0.9 % IV SOLN: 250.0000 mL | INTRAVENOUS | Status: DC | PRN

## 2015-04-09 MED ORDER — IOHEXOL 350 MG/ML SOLN
INTRAVENOUS | Status: DC | PRN
  Administered 2015-04-09: 55 mL via INTRAVENOUS

## 2015-04-09 MED ORDER — ASPIRIN 81 MG PO CHEW
CHEWABLE_TABLET | ORAL | Status: AC
  Filled 2015-04-09: qty 1

## 2015-04-09 MED ORDER — SODIUM CHLORIDE 0.9 % IJ SOLN: 3.0000 mL | Freq: Two times a day (BID) | INTRAMUSCULAR | Status: DC

## 2015-04-09 MED ORDER — VERAPAMIL HCL 2.5 MG/ML IV SOLN
INTRAVENOUS | Status: DC | PRN
  Administered 2015-04-09: 09:00:00 via INTRA_ARTERIAL

## 2015-04-09 MED ORDER — HEPARIN (PORCINE) IN NACL 2-0.9 UNIT/ML-% IJ SOLN
INTRAMUSCULAR | Status: AC
  Filled 2015-04-09: qty 1000

## 2015-04-09 MED ORDER — FENTANYL CITRATE (PF) 100 MCG/2ML IJ SOLN
INTRAMUSCULAR | Status: DC | PRN
  Administered 2015-04-09: 25 ug via INTRAVENOUS

## 2015-04-09 MED ORDER — MIDAZOLAM HCL 2 MG/2ML IJ SOLN
INTRAMUSCULAR | Status: AC
  Filled 2015-04-09: qty 2

## 2015-04-09 MED ORDER — HEPARIN SODIUM (PORCINE) 1000 UNIT/ML IJ SOLN
INTRAMUSCULAR | Status: AC
  Filled 2015-04-09: qty 1

## 2015-04-09 SURGICAL SUPPLY — 15 items
CATH INFINITI 5 FR JL3.5 (CATHETERS) ×2 IMPLANT
CATH INFINITI 5FR ANG PIGTAIL (CATHETERS) ×2 IMPLANT
CATH INFINITI 5FR MULTPACK ANG (CATHETERS) IMPLANT
CATH INFINITI JR4 5F (CATHETERS) ×2 IMPLANT
DEVICE RAD COMP TR BAND LRG (VASCULAR PRODUCTS) ×2 IMPLANT
GLIDESHEATH SLEND SS 6F .021 (SHEATH) ×2 IMPLANT
KIT HEART LEFT (KITS) ×2 IMPLANT
PACK CARDIAC CATHETERIZATION (CUSTOM PROCEDURE TRAY) ×2 IMPLANT
SHEATH PINNACLE 5F 10CM (SHEATH) IMPLANT
SYR MEDRAD MARK V 150ML (SYRINGE) ×2 IMPLANT
TRANSDUCER W/STOPCOCK (MISCELLANEOUS) ×2 IMPLANT
TUBING CIL FLEX 10 FLL-RA (TUBING) ×2 IMPLANT
WIRE EMERALD 3MM-J .035X150CM (WIRE) IMPLANT
WIRE HI TORQ VERSACORE-J 145CM (WIRE) ×1 IMPLANT
WIRE SAFE-T 1.5MM-J .035X260CM (WIRE) ×2 IMPLANT

## 2015-04-09 NOTE — H&P (View-Only) (Signed)
Cardiology Office Note   Date:  03/14/2015   ID:  [name], DOB September 11, 1941, MRN [id number]  PCP:  Brand Malesebra Cobb NP  Cardiologist:   Kaydence Baba SwazilandJordan, MD   Chief Complaint  Patient presents with  . Establish Care    shirtness of breath and chest discomfort, worse when she tosses and turns in bed      History of Present Illness: [name] is a 74 y.o. female who is seen at the request of Brand MalesDebra Cobb NP for evaluation of dyspnea and chest discomfort. She is followed by Dr. Ladona Ridgelaylor in [place] for sinus node dysfunction and is s/p pacemaker. I saw her last in 2009. She had a normal Echo in 2007 and a normal stress Echo in 2010. Today she presents with a 5-6 month history of dyspnea and fatigue. She complains of dyspnea on exertion and also reports she has to sit up at night to get her breath. She describes discomfort in her chest that is constant. She reports some swelling in her ankles at the end of the day. She states she hates salt. She has DM, HTN, and HL. No dizziness, palpitations, or syncope.     Past Medical History  Diagnosis Date  . DM (diabetes mellitus)   . Diverticulosis   . Internal and external hemorrhoids without complication   . Arthritis   . Hypertension   . Hyperlipidemia   . Cardiac arrhythmia     now has pacemaker  . Potassium deficiency   . Fibromyalgia   . Allergy   . Cataract   . GERD (gastroesophageal reflux disease)   . Myocardial infarction     Past Surgical History  Procedure Laterality Date  . Knee surgery      right x 2  . Neck surgery      x2  . Abdominal hysterectomy    . Colonoscopy  multiple  . Pacemaker insertion    . Appendectomy    . Upper gastrointestinal endoscopy    . Bil carple tunnel surgery       Current Outpatient Prescriptions  Medication Sig Dispense Refill  . amLODipine (NORVASC) 2.5 MG tablet Take 2.5 mg by mouth daily.    Marland Kitchen. aspirin 81 MG tablet Take 81 mg by mouth daily.     Marland Kitchen. atorvastatin (LIPITOR) 40 MG  tablet Take 40 mg by mouth daily.    . colesevelam (WELCHOL) 625 MG tablet Take 1,875 mg by mouth 2 (two) times daily with a meal.    . gabapentin (NEURONTIN) 100 MG capsule TAKE ONE CAPSULE BY MOUTH THREE TIMES DAILY 90 capsule 6  . glimepiride (AMARYL) 2 MG tablet Take 1 tablet by mouth 3 (three) times daily with meals. And may take a half a tablet = 1mg  a fourth time if blood sugar is still high.  3  . hydrochlorothiazide (HYDRODIURIL) 25 MG tablet Take 25 mg by mouth daily.    Marland Kitchen. lisinopril (PRINIVIL,ZESTRIL) 40 MG tablet Take 40 mg by mouth daily.    . pantoprazole (PROTONIX) 40 MG tablet Take 1 tablet (40 mg total) by mouth 2 (two) times daily before a meal. 60 tablet 2  . ONE TOUCH ULTRA TEST test strip   2   No current facility-administered medications for this visit.    Allergies:   Other; Caffeine; Codeine; and Metformin and related    Social History:  The patient  reports that she has never smoked. She has never used smokeless tobacco. She reports that  she does not drink alcohol or use illicit drugs.   Family History:  The patient's family history includes Breast cancer in her maternal aunt; Colon cancer in her brother, father, and sister; Colon polyps in her sister; Coronary artery disease in her father; Crohn's disease in her sister; Dementia in her daughter and mother; Diabetes in her cousin, daughter, father, maternal aunt, and maternal uncle; Heart attack in her brother, daughter, and father; Irritable bowel syndrome in her sister; Kidney disease in her daughter; Lung cancer in her father and mother; Prostate cancer in her father. There is no history of Hypertension, Esophageal cancer, Rectal cancer, or Stomach cancer.    ROS:  Please see the history of present illness.   Otherwise, review of systems are positive for none.   All other systems are reviewed and negative.    PHYSICAL EXAM: VS:  BP 126/72 mmHg  Pulse 67  Ht 5\' 6"  (1.676 m)  Wt 172 lb 11.2 oz (78.336 kg)  BMI  27.89 kg/m2 , BMI Body mass index is 27.89 kg/(m^2). GEN: Well nourished, well developed, in no acute distress HEENT: normal Neck: no JVD, carotid bruits, or masses Cardiac: RRR; no murmurs, rubs, or gallops,no edema  Respiratory:  clear to auscultation bilaterally, normal work of breathing GI: soft, nontender, nondistended, + BS MS: no deformity or atrophy Skin: warm and dry, no rash Neuro:  Strength and sensation are intact Psych: euthymic mood, full affect   EKG:  EKG is ordered today. The ekg ordered today demonstrates NSR with nonspecific TWA. I have personally reviewed and interpreted this study.    Recent Labs: 08/08/2014: ALT 64*; Hemoglobin 13.7; Platelets 222 08/10/2014: TSH 2.080 09/10/2014: BUN 30*; Creatinine 0.80; Potassium 3.9; Sodium 142    Lipid Panel    Component Value Date/Time   CHOL 158 08/10/2014 1142   TRIG 135 08/10/2014 1142   HDL 57 08/10/2014 1142   CHOLHDL 2.8 08/10/2014 1142   LDLCALC 74 08/10/2014 1142      Wt Readings from Last 3 Encounters:  03/14/15 172 lb 11.2 oz (78.336 kg)  11/13/14 171 lb 12.8 oz (77.928 kg)  09/10/14 170 lb (77.111 kg)      Other studies Reviewed: Additional studies/ records that were reviewed today include: records and labs from primary care. Review of the above records demonstrates: normal chemistry panel, CBC, TSH, and Rhem screen.    ASSESSMENT AND PLAN:  1.  Dyspnea on exertion and orthopnea. This may represent diastolic CHF but exam today is fairly unremarkable. Will check CXR, BNP level and schedule for Echo.  2. Chest tightness. Atypical. Patient has multiple cardiac risk factors and is at intermediate risk for CAD. Will schedule for stress Myoview.   3. Sinus node dysfunction with bradycardia. Has pacemaker in place with normal function. Predominantly atrial paced.   4. DM type 2  5. HTN controlled.  6. Hyperlipidemia. Will check lipid panel.    Current medicines are reviewed at length with the  patient today.  The patient does not have concerns regarding medicines.  The following changes have been made:  no change  Labs/ tests ordered today include:  Orders Placed This Encounter  Procedures  . DG Chest 2 View  . Lipid panel  . B Nat Peptide  . Myocardial Perfusion Imaging  . EKG 12-Lead  . Echocardiogram     Disposition:   FU after above studies complete  Signed, William Schake SwazilandJordan, MD,FACC 03/14/2015 10:13 AM    [facility] [address], [place], , [address] Phone [phone], Fax [phone]    

## 2015-04-09 NOTE — Discharge Instructions (Addendum)
°  Stop taking lisinopril. Start taking losartan 100 mg daily instead.   Radial Site Care Refer to this sheet in the next few weeks. These instructions provide you with information on caring for yourself after your procedure. Your caregiver may also give you more specific instructions. Your treatment has been planned according to current medical practices, but problems sometimes occur. Call your caregiver if you have any problems or questions after your procedure. HOME CARE INSTRUCTIONS  You may shower the day after the procedure.Remove the bandage (dressing) and gently wash the site with plain soap and water.Gently pat the site dry.  Do not apply powder or lotion to the site.  Do not submerge the affected site in water for 3 to 5 days.  Inspect the site at least twice daily.  Do not flex or bend the affected arm for 24 hours.  No lifting over 5 pounds (2.3 kg) for 5 days after your procedure.  Do not drive home if you are discharged the same day of the procedure. Have someone else drive you.  You may drive 24 hours after the procedure unless otherwise instructed by your caregiver.  Do not operate machinery or power tools for 24 hours.  A responsible adult should be with you for the first 24 hours after you arrive home. What to expect:  Any bruising will usually fade within 1 to 2 weeks.  Blood that collects in the tissue (hematoma) may be painful to the touch. It should usually decrease in size and tenderness within 1 to 2 weeks. SEEK IMMEDIATE MEDICAL CARE IF:  You have unusual pain at the radial site.  You have redness, warmth, swelling, or pain at the radial site.  You have drainage (other than a small amount of blood on the dressing).  You have chills.  You have a fever or persistent symptoms for more than 72 hours.  You have a fever and your symptoms suddenly get worse.  Your arm becomes pale, cool, tingly, or numb.  You have heavy bleeding from the site. Hold  pressure on the site. Document Released: 11/21/2010 Document Revised: 01/11/2012 Document Reviewed: 11/21/2010 [facility] Patient Information 2015 [employer] [place] [place]. This information is not intended to replace advice given to you by your health care provider. Make sure you discuss any questions you have with your health care provider.

## 2015-04-09 NOTE — Interval H&P Note (Signed)
History and Physical Interval Note:  04/09/2015 8:59 AM  [name]  has presented today for surgery, with the diagnosis of abnormal stress test  The various methods of treatment have been discussed with the patient and family. After consideration of risks, benefits and other options for treatment, the patient has consented to  Procedure(s): Left Heart Cath and Coronary Angiography (N/A) as a surgical intervention .  The patient's history has been reviewed, patient examined, no change in status, stable for surgery.  I have reviewed the patient's chart and labs.  Questions were answered to the patient's satisfaction.    Cath Lab Visit (complete for each Cath Lab visit)  Clinical Evaluation Leading to the Procedure:   ACS: No.  Non-ACS:    Anginal Classification: CCS II  Anti-ischemic medical therapy: Minimal Therapy (1 class of medications)  Non-Invasive Test Results: Intermediate-risk stress test findings: cardiac mortality 1-3%/year  Prior CABG: No previous CABG       Theron Aristaeter [facility]JordanMD,FACC 04/09/2015 8:59 AM

## 2015-04-10 ENCOUNTER — Telehealth: Payer: Self-pay

## 2015-04-10 MED ORDER — LOSARTAN POTASSIUM 100 MG PO TABS: 100.0000 mg | ORAL_TABLET | Freq: Every day | ORAL | Status: DC

## 2015-04-10 NOTE — Telephone Encounter (Signed)
Received a call from patient she stated she needed Losartan prescription sent to pharmacy.Refill sent to pharmacy.

## 2015-04-29 ENCOUNTER — Other Ambulatory Visit: Payer: Self-pay

## 2015-05-01 ENCOUNTER — Ambulatory Visit (INDEPENDENT_AMBULATORY_CARE_PROVIDER_SITE_OTHER): Payer: Medicare Other | Admitting: Cardiology

## 2015-05-01 ENCOUNTER — Encounter: Payer: Self-pay | Admitting: Cardiology

## 2015-05-01 VITALS — BP 132/66 | HR 64 | Ht 66.0 in | Wt 175.2 lb

## 2015-05-01 DIAGNOSIS — Z950 Presence of cardiac pacemaker: Secondary | ICD-10-CM | POA: Diagnosis not present

## 2015-05-01 DIAGNOSIS — I10 Essential (primary) hypertension: Secondary | ICD-10-CM | POA: Diagnosis not present

## 2015-05-01 DIAGNOSIS — R0600 Dyspnea, unspecified: Secondary | ICD-10-CM

## 2015-05-01 NOTE — Patient Instructions (Signed)
We will refer you to pulmonary for evaluation  I will see you in 6 months

## 2015-05-01 NOTE — Progress Notes (Signed)
Cardiology Office Note   Date:  05/01/2015   ID:  Ashley, [name] [birth year], MRN [id number]  PCP:  Brand Males NP  Cardiologist:   Drayson Dorko [place], MD   Chief Complaint  Patient presents with  . Follow-up    for catherization.still short of breath doing everyday house chores.some tightness.some swelling bilateral.      History of Present Illness: [name] is a 74 y.o. female who is seen for follow up of dyspnea and chest discomfort. She is followed by Dr. Ladona Ridgel in [place] for sinus node dysfunction and is s/p pacemaker.  She has DM, HTN, and HL. No dizziness, palpitations, or syncope. She reports a one year history of increased dyspnea on exertion. This occurs when doing housework. She has some dry cough. She gets SOB at night with lying down. Notes some mild ankle swelling at the end of the day. She had extensive cardiac evaluation recently as noted below.   Past Medical History  Diagnosis Date  . DM (diabetes mellitus)   . Diverticulosis   . Internal and external hemorrhoids without complication   . Arthritis   . Hypertension   . Hyperlipidemia   . Cardiac arrhythmia     now has pacemaker  . Potassium deficiency   . Fibromyalgia   . Allergy   . Cataract   . GERD (gastroesophageal reflux disease)   . Myocardial infarction     Past Surgical History  Procedure Laterality Date  . Knee surgery      right x 2  . Neck surgery      x2  . Abdominal hysterectomy    . Colonoscopy  multiple  . Pacemaker insertion    . Appendectomy    . Upper gastrointestinal endoscopy    . Bil carple tunnel surgery    . Cardiac catheterization N/A 04/09/2015    Procedure: Left Heart Cath and Coronary Angiography;  Surgeon: Jaylenn Altier M [place], MD;  Location: [facility] INVASIVE CV LAB;  Service: Cardiovascular;  Laterality: N/A;     Current Outpatient Prescriptions  Medication Sig Dispense Refill  . amLODipine (NORVASC) 2.5 MG tablet Take 2.5 mg by mouth daily.    Marland Kitchen  aspirin 81 MG tablet Take 81 mg by mouth daily.     Marland Kitchen atorvastatin (LIPITOR) 40 MG tablet Take 40 mg by mouth daily.    Marland Kitchen gabapentin (NEURONTIN) 100 MG capsule TAKE ONE CAPSULE BY MOUTH THREE TIMES DAILY 90 capsule 6  . glimepiride (AMARYL) 2 MG tablet Take 1 tablet by mouth 3 (three) times daily with meals. And may take a half a tablet = 1mg  a fourth time if blood sugar is still high.  3  . hydrochlorothiazide (HYDRODIURIL) 25 MG tablet Take 25 mg by mouth daily.    Marland Kitchen losartan (COZAAR) 100 MG tablet Take 1 tablet (100 mg total) by mouth daily. 90 tablet 3  . ONE TOUCH ULTRA TEST test strip   2  . pantoprazole (PROTONIX) 40 MG tablet Take 1 tablet (40 mg total) by mouth 2 (two) times daily before a meal. 60 tablet 2   No current facility-administered medications for this visit.    Allergies:   Other; Caffeine; Codeine; and Metformin and related    Social History:  The patient  reports that she has never smoked. She has never used smokeless tobacco. She reports that she does not drink alcohol or use illicit drugs.   Family History:  The patient's family history includes Breast cancer in her  maternal aunt; Colon cancer in her brother, father, and sister; Colon polyps in her sister; Coronary artery disease in her father; Crohn's disease in her sister; Dementia in her daughter and mother; Diabetes in her cousin, daughter, father, maternal aunt, and maternal uncle; Heart attack in her brother, daughter, and father; Irritable bowel syndrome in her sister; Kidney disease in her daughter; Lung cancer in her father and mother; Prostate cancer in her father. There is no history of Hypertension, Esophageal cancer, Rectal cancer, or Stomach cancer.    ROS:  Please see the history of present illness.   Otherwise, review of systems are positive for none.   All other systems are reviewed and negative.    PHYSICAL EXAM: VS:  BP 132/66 mmHg  Pulse 64  Ht 5\' 6"  (1.676 m)  Wt 79.47 kg (175 lb 3.2 oz)  BMI  28.29 kg/m2 , BMI Body mass index is 28.29 kg/(m^2). GEN: Well nourished, well developed, in no acute distress HEENT: normal Neck: no JVD, carotid bruits, or masses Cardiac: RRR; no murmurs, rubs, or gallops,no edema  Respiratory:  clear to auscultation bilaterally, normal work of breathing GI: soft, nontender, nondistended, + BS MS: no deformity or atrophy Skin: warm and dry, no rash, trace edema. Neuro:  Strength and sensation are intact Psych: euthymic mood, full affect   EKG:  EKG is ordered today. The ekg ordered today demonstrates atrial paced rhythm with nonspecific TWA. I have personally reviewed and interpreted this study.    Recent Labs: 08/08/2014: ALT 64* 08/10/2014: TSH 2.080 04/03/2015: BUN 19; Creat 0.96; Hemoglobin 13.8; Platelets 254; Potassium 3.7; Sodium 143    Lipid Panel    Component Value Date/Time   CHOL 198 03/14/2015 1025   CHOL 158 08/10/2014 1142   TRIG 153* 03/14/2015 1025   HDL 54 03/14/2015 1025   HDL 57 08/10/2014 1142   CHOLHDL 3.7 03/14/2015 1025   CHOLHDL 2.8 08/10/2014 1142   VLDL 31 03/14/2015 1025   LDLCALC 113* 03/14/2015 1025   LDLCALC 74 08/10/2014 1142     Lab Results  Component Value Date   WBC 4.6 04/03/2015   HGB 13.8 04/03/2015   HCT 40.7 04/03/2015   PLT 254 04/03/2015   GLUCOSE 165* 04/03/2015   CHOL 198 03/14/2015   TRIG 153* 03/14/2015   HDL 54 03/14/2015   LDLCALC 113* 03/14/2015   ALT 64* 08/08/2014   AST 67* 08/08/2014   NA 143 04/03/2015   K 3.7 04/03/2015   CL 104 04/03/2015   CREATININE 0.96 04/03/2015   BUN 19 04/03/2015   CO2 28 04/03/2015   TSH 2.080 08/10/2014   INR 1.04 04/03/2015   HGBA1C 6.0* 08/10/2014   BNP 84.7  Wt Readings from Last 3 Encounters:  05/01/15 79.47 kg (175 lb 3.2 oz)  04/09/15 78.472 kg (173 lb)  04/02/15 78.472 kg (173 lb)      Other studies Reviewed: Transthoracic Echocardiography  Patient:  [name], [name] MR #:    [id number] Study Date:  04/02/2015 Gender:   F Age:    74 Height:   167.6 cm Weight:   78 kg BSA:    1.92 m^2 Pt. Status: Room:  ATTENDING  [name], M.D. ORDERING  [name], M.D. REFERRING  Wania Longstreth [name], M.D. PERFORMING [facility], Outpatient  cc:  ------------------------------------------------------------------- LV EF: 60% -  65%  ------------------------------------------------------------------- Indications:   786.05 Dyspnea.  ------------------------------------------------------------------- History:  PMH: Hyperlipidemia. Chest pain. PMH:  Myocardial infarction. Risk factors: Hypertension. Diabetes mellitus.  ------------------------------------------------------------------- Study Conclusions  -  Left ventricle: The cavity size was normal. Wall thickness was normal. Systolic function was normal. The estimated ejection fraction was in the range of 60% to 65%. Doppler parameters are consistent with abnormal left ventricular relaxation (grade 1 diastolic dysfunction). - Mitral valve: There was mild regurgitation. - Left atrium: The atrium was mildly dilated.  ------------------------------------------------------------------- Labs, prior tests, procedures, and surgery: Permanent pacemaker system implantation.  Transthoracic echocardiography. M-mode, complete 2D, spectral Doppler, and color Doppler. Birthdate: Patient birthdate: 1941/03/05. Age: Patient is 74 yr old. Sex: Gender: female. BMI: 27.8 kg/m^2. Blood pressure:   126/70 Patient status: Outpatient. Study date: Study date: 04/02/2015. Study time: 08:00 AM. Location: Echo laboratory.  -------------------------------------------------------------------  ------------------------------------------------------------------- Left ventricle: The cavity size was normal. Wall thickness was normal. Systolic function was normal. The estimated ejection fraction was in the range  of 60% to 65%. Doppler parameters are consistent with abnormal left ventricular relaxation (grade 1 diastolic dysfunction).  ------------------------------------------------------------------- Aortic valve:  Structurally normal valve.  Cusp separation was normal. Doppler: Transvalvular velocity was within the normal range. There was no stenosis. There was no regurgitation.  ------------------------------------------------------------------- Aorta: Aortic root: The aortic root was normal in size. Ascending aorta: The ascending aorta was normal in size.  ------------------------------------------------------------------- Mitral valve:  Structurally normal valve.  Leaflet separation was normal. Doppler: Transvalvular velocity was within the normal range. There was no evidence for stenosis. There was mild regurgitation.  ------------------------------------------------------------------- Left atrium: The atrium was mildly dilated.  ------------------------------------------------------------------- Right ventricle: The cavity size was normal. Systolic function was normal.  ------------------------------------------------------------------- Tricuspid valve:  Structurally normal valve.  Leaflet separation was normal. Doppler: Transvalvular velocity was within the normal range. There was trivial regurgitation.  ------------------------------------------------------------------- Pulmonary artery:  Systolic pressure was within the normal range.  ------------------------------------------------------------------- Right atrium: The atrium was normal in size.  ------------------------------------------------------------------- Pericardium: There was no pericardial effusion.  ------------------------------------------------------------------- Systemic veins: Inferior vena cava: The vessel was normal in size. The respirophasic diameter changes were in the normal range  (= 50%), consistent with normal central venous pressure. Diameter: 20 mm.  ------------------------------------------------------------------- Measurements  IVC                     Value    Reference ID                     20  mm   ---------  Left ventricle               Value    Reference LV ID, ED, PLAX chordal       (L)   36.1 mm   43 - 52 LV ID, ES, PLAX chordal           24.6 mm   23 - 38 LV fx shortening, PLAX chordal       32  %   >=29 LV PW thickness, ED             11.6 mm   --------- IVS/LV PW ratio, ED             0.82     <=1.3 Stroke volume, 2D              74  ml   --------- Stroke volume/bsa, 2D            38  ml/m^2 --------- LV e&', lateral               7.68 cm/s  --------- LV E/e&', lateral  9.06     --------- LV e&', medial                4.5  cm/s  --------- LV E/e&', medial               15.47    --------- LV e&', average               6.09 cm/s  --------- LV E/e&', average              11.43    ---------  Ventricular septum             Value    Reference IVS thickness, ED              9.55 mm   ---------  LVOT                    Value    Reference LVOT ID, S                 20  mm   --------- LVOT area                  3.14 cm^2  --------- LVOT peak velocity, S            91.7 cm/s  --------- LVOT mean velocity, S            65.2 cm/s  --------- LVOT VTI, S                 23.5 cm   --------- LVOT peak gradient, S            3   mm Hg ---------  Aorta                    Value     Reference Aortic root ID, ED             28  mm   ---------  Left atrium                 Value    Reference LA ID, A-P, ES               41  mm   --------- LA ID/bsa, A-P               2.13 cm/m^2 <=2.2 LA volume, S                36  ml   --------- LA volume/bsa, S              18.7 ml/m^2 --------- LA volume, ES, 1-p A4C           32  ml   --------- LA volume/bsa, ES, 1-p A4C         16.6 ml/m^2 --------- LA volume, ES, 1-p A2C           35  ml   --------- LA volume/bsa, ES, 1-p A2C         18.2 ml/m^2 ---------  Mitral valve                Value    Reference Mitral E-wave peak velocity         69.6 cm/s  --------- Mitral A-wave peak velocity         88.8 cm/s  --------- Mitral deceleration time          222  ms   150 - 230  Mitral E/A ratio, peak           0.78     ---------  Pulmonary arteries             Value    Reference PA pressure, S, DP             29  mm Hg <=30  Tricuspid valve               Value    Reference Tricuspid regurg peak velocity       254  cm/s  --------- Tricuspid peak RV-RA gradient        26  mm Hg --------- Tricuspid maximal regurg velocity,     254  cm/s  --------- PISA  Systemic veins               Value    Reference Estimated CVP                3   mm Hg ---------  Right ventricle               Value    Reference RV pressure, S, DP             29  mm Hg <=30 RV s&', lateral, S              9.65 cm/s  ---------  Legend: (L) and (H) mark values outside specified reference  range.  ------------------------------------------------------------------- Prepared and Electronically Authenticated by  Kristeen MissPhilip Nahser, M.D. 2016-05-31T10:22:22   Left Heart Cath and Coronary Angiography    PACS Images    Show images for Cardiac catheterization     Link to Procedure Log    Procedure Log      Indications    Abnormal nuclear stress test [R93.1 (ICD-10-CM)]    Technique and Indications    Indication: 74 yo WF with dyspnea and atypical chest pain. Abnormal Myoview study.  Procedural Details: The right wrist was prepped, draped, and anesthetized with 1% lidocaine. Using the modified Seldinger technique, a 6 French slender sheath was introduced into the right radial artery. 3 mg of verapamil was administered through the sheath, weight-based unfractionated heparin was administered intravenously. Standard Judkins catheters were used for selective coronary angiography and left ventriculography. Catheter exchanges were performed over an exchange length guidewire. There were no immediate procedural complications. A TR band was used for radial hemostasis at the completion of the procedure. The patient was transferred to the post catheterization recovery area for further monitoring.  There were no immediate complications during the procedure.    Conclusion     Prox RCA lesion, 10% stenosed.  Prox LAD lesion, 25% stenosed.  1. Nonobstructive CAD- mild 2. Normal LV function 3. Normal LVEDP  Plan: extensive cardiac evaluation has been negative. Will stop ACE inhibitor and try an ARB. If symptoms do not improve will need pulmonary evaluation.     Coronary Findings    Dominance: Right   Left Main  The vessel , is normal in caliber is angiographically normal.     Left Anterior Descending   . Prox LAD lesion, 25% stenosed. discrete .     Left Circumflex  The vessel , is normal in caliber is angiographically normal.     Right Coronary Artery   .  Prox RCA lesion, 10% stenosed. discrete .      Wall Motion                 Left Heart  Left Ventricle The left ventricular size is normal. The left ventricular systolic function is normal. The left ventricular ejection fraction is 55-65% by visual estimate. There are no wall motion abnormalities in the left ventricle.    Coronary Diagrams    Diagnostic Diagram            Implants    Name ID Temporary Type Supply   No information to display    Hemo Data       Most Recent Value   AO Systolic Pressure  129 mmHg   AO Diastolic Pressure  60 mmHg   AO Mean  89 mmHg   LV Systolic Pressure  126 mmHg   LV Diastolic Pressure  3 mmHg   LV EDP  12 mmHg   Arterial Occlusion Pressure Extended Systolic Pressure  132 mmHg   Arterial Occlusion Pressure Extended Diastolic Pressure  59 mmHg   Arterial Occlusion Pressure Extended Mean Pressure  88 mmHg   Left Ventricular Apex Extended Systolic Pressure  130 mmHg   Left Ventricular Apex Extended Diastolic Pressure  2 mmHg   Left Ventricular Apex Extended EDP Pressure  11 mmHg    Order-Level Documents:    There are no order-level documents.    Encounter-Level Documents - 04/03/15:      Scan on 04/10/2015 11:12 AM by Provider Default, MDScan on 04/10/2015 11:12 AM by Provider Default, MD     Scan on 04/10/2015 11:06 AM by Provider Default, MDScan on 04/10/2015 11:06 AM by Provider Default, MD     Scan on 04/09/2015 9:26 AM by Provider Default, MDScan on 04/09/2015 9:26 AM by Provider Default, MD     Electronic signature on 04/09/2015 6:47 AM    Signed    Electronically signed by Sharifah Champine M [place], MD on 04/09/15 at [phone] EDT     ASSESSMENT AND PLAN:  1.  Dyspnea on exertion and orthopnea. Extensive cardiac evaluation is unremarkable. Echo with normal LV function and only grade 1 diastolic dysfunction. Mild MR and LAE. Cath showed no obstructive CAD and normal LV function and normal EDP. BNP is normal. We tried  switching her from ACEi to ARB without improvement. I do not feel her symptoms are cardiac related. I have recommended a formal pulmonary evaluation and will refer.   2. Chest tightness. Atypical. No significant CAD.  3. Sinus node dysfunction with bradycardia. Has pacemaker in place with normal function. Predominantly atrial paced. No Afib.   4. DM type 2  5. HTN controlled.  6. Hyperlipidemia.    Current medicines are reviewed at length with the patient today.  The patient does not have concerns regarding medicines.  The following changes have been made:  no change  Labs/ tests ordered today include:   Orders Placed This Encounter  Procedures  . Ambulatory referral to Pulmonology     Disposition:   FU 6 months with me  Signed, Ceclia Koker [place], MD,FACC 05/01/2015 5:28 PM    [facility] [address] [place], [place], [address] Phone [phone], Fax [phone]

## 2015-05-07 NOTE — Addendum Note (Signed)
Addended by: Willia CrazeMANCEBO, Fleming Prill on: 05/07/2015 08:04 AM   Modules accepted: Orders

## 2015-05-15 ENCOUNTER — Ambulatory Visit (INDEPENDENT_AMBULATORY_CARE_PROVIDER_SITE_OTHER): Payer: Medicare Other | Admitting: *Deleted

## 2015-05-15 DIAGNOSIS — I495 Sick sinus syndrome: Secondary | ICD-10-CM | POA: Diagnosis not present

## 2015-05-15 NOTE — Progress Notes (Signed)
Remote pacemaker transmission.   

## 2015-05-16 NOTE — Addendum Note (Signed)
Addended by: Neta EhlersRUITT, Nahomy Limburg M on: 05/16/2015 05:29 PM   Modules accepted: Orders

## 2015-05-21 LAB — CUP PACEART REMOTE DEVICE CHECK
Battery Impedance: 2626 Ohm
Battery Remaining Longevity: 19 mo
Battery Voltage: 2.7 V
Brady Statistic AP VP Percent: 0 %
Brady Statistic AP VS Percent: 97 %
Brady Statistic AS VP Percent: 0 %
Brady Statistic AS VS Percent: 3 %
Date Time Interrogation Session: 20160713121325
Lead Channel Impedance Value: 501 Ohm
Lead Channel Impedance Value: 637 Ohm
Lead Channel Pacing Threshold Amplitude: 0.75 V
Lead Channel Pacing Threshold Amplitude: 0.75 V
Lead Channel Pacing Threshold Pulse Width: 0.4 ms
Lead Channel Pacing Threshold Pulse Width: 0.4 ms
Lead Channel Sensing Intrinsic Amplitude: 22.4 mV
Lead Channel Setting Pacing Amplitude: 2 V
Lead Channel Setting Pacing Amplitude: 2.5 V
Lead Channel Setting Pacing Pulse Width: 0.4 ms
Lead Channel Setting Sensing Sensitivity: 5.6 mV

## 2015-05-22 ENCOUNTER — Institutional Professional Consult (permissible substitution): Payer: Medicare Other | Admitting: Pulmonary Disease

## 2015-06-07 ENCOUNTER — Encounter: Payer: Self-pay | Admitting: Cardiology

## 2015-06-11 ENCOUNTER — Encounter: Payer: Self-pay | Admitting: Internal Medicine

## 2015-07-23 ENCOUNTER — Ambulatory Visit (INDEPENDENT_AMBULATORY_CARE_PROVIDER_SITE_OTHER): Payer: Medicare Other | Admitting: Neurology

## 2015-07-23 ENCOUNTER — Encounter: Payer: Self-pay | Admitting: Neurology

## 2015-07-23 VITALS — BP 138/80 | HR 66 | Ht 66.0 in | Wt 175.2 lb

## 2015-07-23 DIAGNOSIS — F329 Major depressive disorder, single episode, unspecified: Secondary | ICD-10-CM | POA: Diagnosis not present

## 2015-07-23 DIAGNOSIS — F411 Generalized anxiety disorder: Secondary | ICD-10-CM | POA: Diagnosis not present

## 2015-07-23 DIAGNOSIS — E1142 Type 2 diabetes mellitus with diabetic polyneuropathy: Secondary | ICD-10-CM | POA: Diagnosis not present

## 2015-07-23 DIAGNOSIS — G2579 Other drug induced movement disorders: Secondary | ICD-10-CM | POA: Diagnosis not present

## 2015-07-23 DIAGNOSIS — F32A Depression, unspecified: Secondary | ICD-10-CM

## 2015-07-23 DIAGNOSIS — Z950 Presence of cardiac pacemaker: Secondary | ICD-10-CM | POA: Diagnosis not present

## 2015-07-23 DIAGNOSIS — G9081 Serotonin syndrome: Secondary | ICD-10-CM

## 2015-07-23 DIAGNOSIS — I10 Essential (primary) hypertension: Secondary | ICD-10-CM

## 2015-07-23 MED ORDER — CLONAZEPAM 0.5 MG PO TABS: 0.5000 mg | ORAL_TABLET | Freq: Two times a day (BID) | ORAL | Status: DC | PRN

## 2015-07-23 MED ORDER — CLONAZEPAM 0.5 MG PO TABS: 0.5000 mg | ORAL_TABLET | Freq: Every day | ORAL | Status: DC

## 2015-07-23 NOTE — Patient Instructions (Signed)
-   continue the clonazepam for anxiety - continue gabapentin for DM neuropathy. 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 - Follow up with your primary care physician for stroke risk factor modification. Recommend maintain blood pressure goal <130/80, diabetes with hemoglobin A1c goal below 6.5% and lipids with LDL cholesterol goal below 70 mg/dL.  - make appointment with PCP for a psychiatry referral. - follow up in 3 months.

## 2015-07-24 DIAGNOSIS — E1142 Type 2 diabetes mellitus with diabetic polyneuropathy: Secondary | ICD-10-CM

## 2015-07-24 HISTORY — DX: Type 2 diabetes mellitus with diabetic polyneuropathy: E11.42

## 2015-07-24 NOTE — Progress Notes (Signed)
NEUROLOGY CLINIC FOLLOW UP PATIENT NOTE  NAME: [name] DOB: Sep 15, 1941  History taken from chart and pt  She was accompanied by no one today in clinic.  History summary: [name] is a 74 y.o. female with PMH of HTN, DM, sinus node dysfunction s/p acemaker who presents on 08/10/14 as a new patient for whole body shakiness. Pt stated that she had a daughter with hx of early onset dementia, multiple strokes, seizure, WM disease, DM, failed kidney transplant, uterus cancer s/p hysterectomy, bedridden. She had been taking care of her for the last 30 years. Daughter eventually died in 03/10/2014 at age of 47. She was depressed and was put on paxil and cymbalta about 3-4 months ago. She was also put on xanax at night for sleep. For the last 6-8 weeks also, she noticed that she stated to have shakiness of her body, gradually getting worse, more hyperactivity and shaky, muscle jumping, feeling of on-the-go. After Xanax, she could sleep for few hours and then woke up in the middle of night, shaking all over. For the last week, it was way worse and she woke up at night feel chest tightness and she afraid of heart attack. She went to ER on 08/08/14 and had CT done which was negative for acute abnormalities and troponin negative. She was told to follow up with neurology as outpt. She denies any weaknss, numbness, LOC, seizure like activity, speech or vision changes.  She has hx of HTN on lisinopril, norvasc, and HCTZ. Her BP today is 123/65. She is also on amaryl for DM. She has FM of dementia. Besides her daughter, her two sisters had dementia and died at their 22s. She denies smoking, alcohol or illicit drugs.  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 Her paxil and cymbalta were stopped along with Xanax, and I put her on clonazepam for treatment.   Follow up 09/03/14 - she was doing well. The shakiness of body was gone, she felt more relaxed and no more muscle jumping. She was able  to sleep well. She tolerated clonazepam well and stated it helped her anxiety too. Her blood testing including A1C, LDL and TSH, free T4 were all normal. She stated that she still has intermittent numbness and tingling feelings in her fingers and toes. She has not been on gabapentin before. She stated that her sugar level is not in excellent control, ranging from 160s to 200s but her A1C last time we checked was 6.0, indicating good control. Her BP today is 148/81.   Interval history: During the interval time, she had been doing well until this 11-Mar-2023 when she developed chest discomfort and dyspnea and orthopnea. In the past, pt has been following with Dr. Ladona Ridgel in [place] for sinus node dysfunction and had pacemaker placement in Mar 11, 2011. Therefore, she was seen by cardiology and had several tests done. Her nuclear stress test done on 04/02/15 showed abnormal and indicating anterior wall ischemia. She then had 2D echo done showed EF 60-65% and cardiac cath done showed almost no stenosis of coronary arteries with normal LV function and normal EDP. BNP is normal. She was then considered non-cardiac related symptoms.   By detailed questioning, she admitted that she is depressed with anxiety. She still not able to get over with her daughter's death one year ago. She regret that she made several mistakes over her cemetery and miss her everyday. She is in tears throughout the clinic visit. She stated that she has a son  who never contacted her since long time ago and denied her visit to her granddaughter although the son's family lives just in [place]. She married to her husband who is 9 yo and not father of her son and daughter. 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 She feels bored and empty.    Past Medical History  Diagnosis Date  . DM (diabetes mellitus)   . Diverticulosis   . Internal and external hemorrhoids without complication   . Arthritis   . Hypertension   .  Hyperlipidemia   . Cardiac arrhythmia     now has pacemaker  . Potassium deficiency   . Fibromyalgia   . Allergy   . Cataract   . GERD (gastroesophageal reflux disease)   . Myocardial infarction    Past Surgical History  Procedure Laterality Date  . Knee surgery      right x 2  . Neck surgery      x2  . Abdominal hysterectomy    . Colonoscopy  multiple  . Pacemaker insertion    . Appendectomy    . Upper gastrointestinal endoscopy    . Bil carple tunnel surgery    . Cardiac catheterization N/A 04/09/2015    Procedure: Left Heart Cath and Coronary Angiography;  Surgeon: Peter M Swaziland, MD;  Location: [facility] INVASIVE CV LAB;  Service: Cardiovascular;  Laterality: N/A;   Family History  Problem Relation Age of Onset  . Coronary artery disease Father   . Prostate cancer Father   . Colon cancer Father   . Lung cancer Father   . Diabetes Father   . Hypertension Neg Hx   . Esophageal cancer Neg Hx   . Rectal cancer Neg Hx   . Stomach cancer Neg Hx   . Diabetes Daughter   . Dementia Daughter   . Breast cancer Maternal Aunt   . Colon cancer Sister     x 2  . Colon cancer Brother   . Lung cancer Mother   . Dementia Mother   . Colon polyps Sister   . Crohn's disease Sister   . Kidney disease Daughter   . Diabetes Cousin     multiple  . Diabetes Maternal Aunt     multiple  . Diabetes Maternal Uncle     multiple  . Irritable bowel syndrome Sister   . Heart attack Daughter   . Heart attack Father   . Heart attack Brother    Current Outpatient Prescriptions  Medication Sig Dispense Refill  . amLODipine (NORVASC) 2.5 MG tablet Take 2.5 mg by mouth daily.    Marland Kitchen aspirin 81 MG tablet Take 81 mg by mouth daily.     Marland Kitchen atorvastatin (LIPITOR) 40 MG tablet Take 40 mg by mouth daily.    Marland Kitchen FLUZONE HIGH-DOSE 0.5 ML SUSY ADM 0.5ML IM UTD  0  . gabapentin (NEURONTIN) 100 MG capsule TAKE ONE CAPSULE BY MOUTH THREE TIMES DAILY 90 capsule 6  . glimepiride (AMARYL) 2 MG tablet Take 1 tablet  by mouth 3 (three) times daily with meals. And may take a half a tablet = 1mg  a fourth time if blood sugar is still high.  3  . glucose blood (ONE TOUCH ULTRA TEST) test strip AS DIRECTED TWICE DAILY    . hydrochlorothiazide (HYDRODIURIL) 25 MG tablet Take 25 mg by mouth daily.    Marland Kitchen losartan (COZAAR) 100 MG tablet Take 1 tablet (100 mg  total) by mouth daily. 90 tablet 3  . ONE TOUCH ULTRA TEST test strip   2  . pantoprazole (PROTONIX) 40 MG tablet Take 1 tablet (40 mg total) by mouth 2 (two) times daily before a meal. 60 tablet 2  . clonazePAM (KLONOPIN) 0.5 MG tablet Take 1 tablet (0.5 mg total) by mouth 2 (two) times daily as needed for anxiety. 60 tablet 3   No current facility-administered medications for this visit.   Allergies  Allergen Reactions  . Other     Pt has a recent bout of Seratonin-Syndrome so patient is unable to take certain medications anymore. Cymbalta, Paxil, Trazodone  . Caffeine     Nervousness   . Codeine Other (See Comments)    hallucinations  . Metformin And Related     Causes dehydration and causes me to go to the hospital   Social History   Social History  . Marital Status: Married    Spouse Name: N/A  . Number of Children: 2  . Years of Education: 12   Occupational History  . Retired    Social History Main Topics  . Smoking status: Never Smoker   . Smokeless tobacco: Never Used  . Alcohol Use: No  . Drug Use: No  . Sexual Activity: Not on file   Other Topics Concern  . Not on file   Social History Narrative   Patient is married with 2 children.   Patient is right handed.   Patient has 12 th grade education.   Patient does not drink caffeine.    Review of Systems Full 14 system review of systems performed and notable only for those listed, all others are neg:  Constitutional: appetite change, fatigue, excessive sweating  Cardiovascular: palpitations  Ear/Nose/Throat: N/A  Skin: N/A  Eyes: Light sensitivity  Respiratory: SOB, chest  tightness  Gastroitestinal: constipation  Hematology/Lymphatic:   Endocrine: Heat and cold intolerance Musculoskeletal: joint pain, arching muscles, walking difficulty, neck pain Allergy/Immunology: N/A  Neurological: Headache, dizziness  Psychiatric: Depression, nurse, anxious, insomnia, frequent waking   Physical Exam  Filed Vitals:   07/23/15 0852  BP: 138/80  Pulse: 66    General - Well nourished, well developed, in no acute distress, no acute distress.  Ophthalmologic - not able to see through.  Cardiovascular - Regular rate and rhythm with no murmur. Carotid pulses were 2+ without bruits .   Neck - supple, no nuchal rigidity.  Mental Status -  Level of arousal and orientation to time, place, and person were intact. Language including expression, naming, repetition, comprehension, reading, and writing was assessed and found intact.  Cranial Nerves II - XII - II - Visual field intact OU. III, IV, VI - Extraocular movements intact. V - Facial sensation intact bilaterally. VII - Facial movement intact bilaterally.  VIII - Hearing & vestibular intact bilaterally. X - Palate elevates symmetrically. XI - Chin turning & shoulder shrug intact bilaterally. XII - Tongue protrusion intact.  Motor Strength - The patient's strength was normal in all extremities and pronator drift was absent.  Bulk was normal and fasciculations were absent.   Motor Tone - Muscle tone was assessed at the neck and appendages and was normal.  Reflexes - The patient's reflexes were normal in all extremities and she had no pathological reflexes.  Sensory - Light touch, temperature/pinprick were assessed and were normal.    Coordination - The patient had normal movements in the hands and feet with no ataxia or dysmetria.   Gait  and Station - The patient's transfers, posture, gait, station, and turns were observed as normal.   Imaging CT head 08/08/14 -  1. No acute intracranial abnormalities.    2. Nonspecific area of low attenuation at the right white matter  junction the posterior right frontal lobe is likely related to prior  infarct. In light of the patient's current symptoms, a more  definitive workup with contrast enhanced MRI of the brain may be  Helpful.   Nuclear stress test 04/02/15 Myocardial perfusion is abnormal. Moderate size and intensity, reversible anterior perfusion defect. Findings consistent with ischemia. This is an intermediate risk study. Overall left ventricular systolic function was normal. LV cavity size is normal. The left ventricular ejection fraction is hyperdynamic (>65%). There is no prior study for comparison.  2-D echo - Left ventricle: The cavity size was normal. Wall thickness was normal. Systolic function was normal. The estimated ejection fraction was in the range of 60% to 65%. Doppler parameters are consistent with abnormal left ventricular relaxation (grade 1 diastolic dysfunction). - Mitral valve: There was mild regurgitation. - Left atrium: The atrium was mildly dilated.  Cardiac cath  Prox RCA lesion, 10% stenosed.  Prox LAD lesion, 25% stenosed. 1. Nonobstructive CAD- mild 2. Normal LV function 3. Normal LVEDP  Lab Review  Component     Latest Ref Rng 08/10/2014 03/14/2015  Cholesterol, Total     100 - 199 mg/dL 811   Triglycerides     <150 mg/dL 914 782 (H)  HDL Cholesterol     >=46 mg/dL 57 54  VLDL Cholesterol Cal     5 - 40 mg/dL 27   LDL (calc)     0 - 99 mg/dL 74 956 (H)  Total CHOL/HDL Ratio      2.8 3.7  Cholesterol     0 - 200 mg/dL  213  VLDL     0 - 40 mg/dL  31  TSH     0.865 - 7.846 uIU/mL 2.080   Free T4     0.82 - 1.77 ng/dL 9.62   Hemoglobin X5M     4.8 - 5.6 % 6.0 (H)   Est. average glucose Bld gHb Est-mCnc      126   Brain Natriuretic Peptide     0.0 - 100.0 pg/mL  84.7    Assessment    In summary, [name] is a 74 y.o. female with PMH of HTN, DM, sinus node  dysfunction s/p acemaker who is followed up in clinic for whole body shakiness. 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 After stopping her paxil and cymbalta as well as Xanax and put on clonazepam, her symptoms resolved. She stated that clonazepam also helped her anxiety. She still has numbness and tingling in her toes and fingertips, consistent with DM neuropathy. will benefit from low dose gabapentin.   During the interval time, she started to have dyspnea, chest tightness and orthopnea. Cardiac workup negative. She admitted to have severe depression, anxiety over the death of her daughter and the relationship with her son and husband. 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 She denies any SI or HI. However, due to her previous history of serotonin syndrome with using SSRI, is warranted for her to have a psychiatry consult to consider further management of depression. Continue Klonopin and gabapentin for now. She has appointment with her PCP early next month.  Plan: - continue  the clonazepam for anxiety - continue gabapentin for DM neuropathy. 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 - Follow up with your primary care physician for stroke risk factor modification. Recommend maintain blood pressure goal <130/80, diabetes with hemoglobin A1c goal below 6.5% and lipids with LDL cholesterol goal below 70 mg/dL.  - make appointment with PCP for a psychiatry referral. Patient agreed. - RTC in 3 months.  I spent more than 25 minutes of face to face time with the patient. Greater than 50% of time was spent in counseling and coordination of care.    Meds ordered this encounter  Medications  . glucose blood (ONE TOUCH ULTRA TEST) test strip    Sig: AS DIRECTED TWICE DAILY  . FLUZONE HIGH-DOSE 0.5 ML SUSY    Sig: ADM 0.5ML IM UTD    Refill:  0  .  DISCONTD: clonazePAM (KLONOPIN) 0.5 MG tablet    Sig: Take 1 tablet (0.5 mg total) by mouth at bedtime.    Dispense:  30 tablet    Refill:  3  . clonazePAM (KLONOPIN) 0.5 MG tablet    Sig: Take 1 tablet (0.5 mg total) by mouth 2 (two) times daily as needed for anxiety.    Dispense:  60 tablet    Refill:  3    Patient Instructions  - continue the clonazepam for anxiety - continue gabapentin for DM neuropathy. 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 - Follow up with your primary care physician for stroke risk factor modification. Recommend maintain blood pressure goal <130/80, diabetes with hemoglobin A1c goal below 6.5% and lipids with LDL cholesterol goal below 70 mg/dL.  - make appointment with PCP for a psychiatry referral. - follow up in 3 months.    Marvel Plan, MD PhD [facility] [address] [place], [place] [address] [phone]

## 2015-08-05 ENCOUNTER — Other Ambulatory Visit: Payer: Self-pay | Admitting: Neurology

## 2015-08-15 ENCOUNTER — Telehealth: Payer: Self-pay | Admitting: Cardiology

## 2015-08-15 ENCOUNTER — Ambulatory Visit (INDEPENDENT_AMBULATORY_CARE_PROVIDER_SITE_OTHER): Payer: Medicare Other | Admitting: *Deleted

## 2015-08-15 DIAGNOSIS — I495 Sick sinus syndrome: Secondary | ICD-10-CM | POA: Diagnosis not present

## 2015-08-15 NOTE — Progress Notes (Signed)
Remote pacemaker transmission.   

## 2015-08-15 NOTE — Telephone Encounter (Signed)
Spoke with pt and reminded pt of remote transmission that is due today. Pt verbalized understanding.   

## 2015-08-16 LAB — CUP PACEART REMOTE DEVICE CHECK
Battery Impedance: 3226 Ohm
Battery Remaining Longevity: 14 mo
Battery Voltage: 2.69 V
Brady Statistic AP VP Percent: 0 %
Brady Statistic AP VS Percent: 98 %
Brady Statistic AS VP Percent: 0 %
Brady Statistic AS VS Percent: 2 %
Date Time Interrogation Session: 20161013151813
Implantable Lead Implant Date: 20070910
Implantable Lead Implant Date: 20070910
Implantable Lead Location: 753859
Implantable Lead Location: 753860
Implantable Lead Model: 5076
Implantable Lead Model: 5076
Lead Channel Impedance Value: 489 Ohm
Lead Channel Impedance Value: 645 Ohm
Lead Channel Pacing Threshold Amplitude: 0.75 V
Lead Channel Pacing Threshold Amplitude: 0.75 V
Lead Channel Pacing Threshold Pulse Width: 0.4 ms
Lead Channel Pacing Threshold Pulse Width: 0.4 ms
Lead Channel Sensing Intrinsic Amplitude: 16 mV
Lead Channel Setting Pacing Amplitude: 2 V
Lead Channel Setting Pacing Amplitude: 2.5 V
Lead Channel Setting Pacing Pulse Width: 0.4 ms
Lead Channel Setting Sensing Sensitivity: 5.6 mV

## 2015-08-20 ENCOUNTER — Encounter: Payer: Self-pay | Admitting: Cardiology

## 2015-10-22 ENCOUNTER — Other Ambulatory Visit (HOSPITAL_COMMUNITY): Payer: Self-pay | Admitting: Psychiatry

## 2015-11-11 ENCOUNTER — Ambulatory Visit: Payer: Medicare Other | Admitting: Neurology

## 2015-11-15 ENCOUNTER — Encounter: Payer: Self-pay | Admitting: Neurology

## 2015-11-15 ENCOUNTER — Ambulatory Visit (INDEPENDENT_AMBULATORY_CARE_PROVIDER_SITE_OTHER): Payer: Medicare Other | Admitting: Neurology

## 2015-11-15 VITALS — BP 137/72 | HR 63 | Ht 66.0 in | Wt 169.2 lb

## 2015-11-15 DIAGNOSIS — E119 Type 2 diabetes mellitus without complications: Secondary | ICD-10-CM | POA: Diagnosis not present

## 2015-11-15 DIAGNOSIS — F329 Major depressive disorder, single episode, unspecified: Secondary | ICD-10-CM | POA: Diagnosis not present

## 2015-11-15 DIAGNOSIS — G451 Carotid artery syndrome (hemispheric): Secondary | ICD-10-CM | POA: Diagnosis not present

## 2015-11-15 DIAGNOSIS — F411 Generalized anxiety disorder: Secondary | ICD-10-CM

## 2015-11-15 DIAGNOSIS — F32A Depression, unspecified: Secondary | ICD-10-CM

## 2015-11-15 DIAGNOSIS — G459 Transient cerebral ischemic attack, unspecified: Secondary | ICD-10-CM

## 2015-11-15 DIAGNOSIS — I10 Essential (primary) hypertension: Secondary | ICD-10-CM

## 2015-11-15 HISTORY — DX: Transient cerebral ischemic attack, unspecified: G45.9

## 2015-11-15 MED ORDER — CLOPIDOGREL BISULFATE 75 MG PO TABS: 75.0000 mg | ORAL_TABLET | Freq: Every day | ORAL | Status: DC

## 2015-11-15 NOTE — Progress Notes (Signed)
NEUROLOGY CLINIC FOLLOW UP PATIENT NOTE  NAME: [name] DOB: 11-Mar-1941  History taken from chart and pt  She was accompanied by no one today in clinic.  History summary: [name] is a 75 y.o. female with PMH of HTN, DM, sinus node dysfunction s/p acemaker who presents on 08/10/14 as a new patient for whole body shakiness. Pt stated that she had a daughter with hx of early onset dementia, multiple strokes, seizure, WM disease, DM, failed kidney transplant, uterus cancer s/p hysterectomy, bedridden. She had been taking care of her for the last 30 years. Daughter eventually died in 04/03/2014 at age of 73. She was depressed and was put on paxil and cymbalta about 3-4 months ago. She was also put on xanax at night for sleep. For the last 6-8 weeks also, she noticed that she stated to have shakiness of her body, gradually getting worse, more hyperactivity and shaky, muscle jumping, feeling of on-the-go. After Xanax, she could sleep for few hours and then woke up in the middle of night, shaking all over. For the last week, it was way worse and she woke up at night feel chest tightness and she afraid of heart attack. She went to ER on 08/08/14 and had CT done which was negative for acute abnormalities and troponin negative. She was told to follow up with neurology as outpt. She denies any weaknss, numbness, LOC, seizure like activity, speech or vision changes.  She has hx of HTN on lisinopril, norvasc, and HCTZ. Her BP today is 123/65. She is also on amaryl for DM. She has FM of dementia. Besides her daughter, her two sisters had dementia and died at their 36s. She denies smoking, alcohol or illicit drugs.  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 Her paxil and cymbalta were stopped along with Xanax, and I put her on clonazepam for treatment.   Follow up 09/03/14 - she was doing well. The shakiness of body was gone, she felt more relaxed and no more muscle jumping. She was able  to sleep well. She tolerated clonazepam well and stated it helped her anxiety too. Her blood testing including A1C, LDL and TSH, free T4 were all normal. She stated that she still has intermittent numbness and tingling feelings in her fingers and toes. She has not been on gabapentin before. She stated that her sugar level is not in excellent control, ranging from 160s to 200s but her A1C last time we checked was 6.0, indicating good control. Her BP today is 148/81.   07/24/15 follow up - she had been doing well until this 2023/04/04 when she developed chest discomfort and dyspnea and orthopnea. In the past, pt has been following with Dr. Ladona Ridgel in [place] for sinus node dysfunction and had pacemaker placement in 2011-04-04. Therefore, she was seen by cardiology and had several tests done. Her nuclear stress test done on 04/02/15 showed abnormal and indicating anterior wall ischemia. She then had 2D echo done showed EF 60-65% and cardiac cath done showed almost no stenosis of coronary arteries with normal LV function and normal EDP. BNP is normal. She was then considered non-cardiac related symptoms.   By detailed questioning, she admitted that she is depressed with anxiety. She still not able to get over with her daughter's death one year ago. She regret that she made several mistakes over her cemetery and miss her everyday. She is in tears throughout the clinic visit. She stated that she has a son who never  contacted her since long time ago and denied her visit to her granddaughter although the son's family lives just in [place]. She married to her husband who is 22 yo and not father of her son and daughter. 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 She feels bored and empty.   Interval history: During the interval time, pt had psychiatry follow up and was put on wellbutrin and increased Klonopin dose for depression and anxiety. She felt somehow better symptoms. She has appointment  again with psychiatrist on 12/17/15.  However, she stated that about 2 weeks ago, she was talking with friend and had sudden onset right arm numbness, weakness, not able to move right arm, not able to walk well due to right leg weakness and leaning towards right on walking. Able to talk, no facial involvement. Symptoms lasted about 30-62min and resolved. Her CT in 05/2014 questionable for right frontal WM infarct. Not able to have MRI due to pacemaker. She stated that she was told to have afib during stress test last year by Dr. Ladona Ridgel. However, her cardiologist Dr. Katrinka Blazing has not find any documentation for afib. Pt has appointment with Dr. Ladona Ridgel next week    Past Medical History  Diagnosis Date  . DM (diabetes mellitus) (HCC)   . Diverticulosis   . Internal and external hemorrhoids without complication   . Arthritis   . Hypertension   . Hyperlipidemia   . Cardiac arrhythmia     now has pacemaker  . Potassium deficiency   . Fibromyalgia   . Allergy   . Cataract   . GERD (gastroesophageal reflux disease)   . Myocardial infarction [facility])    Past Surgical History  Procedure Laterality Date  . Knee surgery      right x 2  . Neck surgery      x2  . Abdominal hysterectomy    . Colonoscopy  multiple  . Pacemaker insertion    . Appendectomy    . Upper gastrointestinal endoscopy    . Bil carple tunnel surgery    . Cardiac catheterization N/A 04/09/2015    Procedure: Left Heart Cath and Coronary Angiography;  Surgeon: Peter M Swazil[place], MD;  Location: [facility] INVASIVE CV LAB;  Service: Cardiovascular;  Laterality: N/A;   Family History  Problem Relation Age of Onset  . Coronary artery disease Father   . Prostate cancer Father   . Colon cancer Father   . Lung cancer Father   . Diabetes Father   . Hypertension Neg Hx   . Esophageal cancer Neg Hx   . Rectal cancer Neg Hx   . Stomach cancer Neg Hx   . Diabetes Daughter   . Dementia Daughter   . Breast cancer Maternal Aunt   . Colon cancer  Sister     x 2  . Colon cancer Brother   . Lung cancer Mother   . Dementia Mother   . Colon polyps Sister   . Crohn's disease Sister   . Kidney disease Daughter   . Diabetes Cousin     multiple  . Diabetes Maternal Aunt     multiple  . Diabetes Maternal Uncle     multiple  . Irritable bowel syndrome Sister   . Heart attack Daughter   . Heart attack Father   . Heart attack Brother    Current Outpatient Prescriptions  Medication Sig Dispense Refill  . amLODipine (NORVASC) 2.5 MG tablet  Take 2.5 mg by mouth daily.    Marland Kitchen atorvastatin (LIPITOR) 40 MG tablet Take 40 mg by mouth daily.    Marland Kitchen buPROPion (WELLBUTRIN XL) 150 MG 24 hr tablet take two in the morning  3  . clonazePAM (KLONOPIN) 0.5 MG tablet Take 1 tablet (0.5 mg total) by mouth 2 (two) times daily as needed for anxiety. (Patient taking differently: Take 0.5 mg by mouth 3 (three) times daily. Take two at bedtime and one in the am) 60 tablet 3  . FLUZONE HIGH-DOSE 0.5 ML SUSY ADM 0.5ML IM UTD  0  . gabapentin (NEURONTIN) 100 MG capsule TAKE 1 CAPSULE BY MOUTH THREE TIMES DAILY 270 capsule 0  . glimepiride (AMARYL) 2 MG tablet Take 1 tablet by mouth 3 (three) times daily with meals. And may take a half a tablet = 1mg  a fourth time if blood sugar is still high.  3  . glucose blood (ONE TOUCH ULTRA TEST) test strip AS DIRECTED TWICE DAILY    . hydrochlorothiazide (HYDRODIURIL) 25 MG tablet Take 25 mg by mouth daily.    Marland Kitchen losartan (COZAAR) 100 MG tablet Take 1 tablet (100 mg total) by mouth daily. 90 tablet 3  . ONE TOUCH ULTRA TEST test strip   2  . pantoprazole (PROTONIX) 40 MG tablet Take 1 tablet (40 mg total) by mouth 2 (two) times daily before a meal. 60 tablet 2  . clopidogrel (PLAVIX) 75 MG tablet Take 1 tablet (75 mg total) by mouth daily. 30 tablet 11   No current facility-administered medications for this visit.   Allergies  Allergen Reactions  . Other     Pt has a recent bout of Seratonin-Syndrome so patient is unable  to take certain medications anymore. Cymbalta, Paxil, Trazodone  . Caffeine     Nervousness   . Codeine Other (See Comments)    hallucinations  . Metformin And Related     Causes dehydration and causes me to go to the hospital   Social History   Social History  . Marital Status: Married    Spouse Name: N/A  . Number of Children: 2  . Years of Education: 12   Occupational History  . Retired    Social History Main Topics  . Smoking status: Never Smoker   . Smokeless tobacco: Never Used  . Alcohol Use: No  . Drug Use: No  . Sexual Activity: Not on file   Other Topics Concern  . Not on file   Social History Narrative   Patient is married with 2 children.   Patient is right handed.   Patient has 12 th grade education.   Patient does not drink caffeine.    Review of Systems Full 14 system review of systems performed and notable only for those listed, all others are neg:  Constitutional: chills, fatigue  Cardiovascular:   Ear/Nose/Throat: N/A  Skin: N/A  Eyes:   Respiratory:   Gastroitestinal:   Hematology/Lymphatic:   Endocrine: Heat and cold intolerance Musculoskeletal: joint pain, arching muscles, walking difficulty Allergy/Immunology: N/A  Neurological: Headache, dizziness, numbness, weakness, tremors  Psychiatric: Depression,  insomnia   Physical Exam  Filed Vitals:   11/15/15 1307  BP: 137/72  Pulse: 63    General - Well nourished, well developed, in no acute distress, no acute distress.  Ophthalmologic - not able to see through.  Cardiovascular - Regular rate and rhythm with no murmur. Carotid pulses were 2+ without bruits .   Neck - supple, no nuchal  rigidity.  Mental Status -  Level of arousal and orientation to time, place, and person were intact. Language including expression, naming, repetition, comprehension, reading, and writing was assessed and found intact.  Cranial Nerves II - XII - II - Visual field intact OU. III, IV, VI -  Extraocular movements intact. V - Facial sensation intact bilaterally. VII - Facial movement intact bilaterally.  VIII - Hearing & vestibular intact bilaterally. X - Palate elevates symmetrically. XI - Chin turning & shoulder shrug intact bilaterally. XII - Tongue protrusion intact.  Motor Strength - The patient's strength was normal in all extremities and pronator drift was absent.  Bulk was normal and fasciculations were absent.   Motor Tone - Muscle tone was assessed at the neck and appendages and was normal.  Reflexes - The patient's reflexes were normal in all extremities and she had no pathological reflexes.  Sensory - Light touch, temperature/pinprick were assessed and were normal.    Coordination - The patient had normal movements in the hands and feet with no ataxia or dysmetria.   Gait and Station - The patient's transfers, posture, gait, station, and turns were observed as normal.   Imaging CT head 08/08/14 -  1. No acute intracranial abnormalities.  2. Nonspecific area of low attenuation at the right white matter  junction the posterior right frontal lobe is likely related to prior  infarct. In light of the patient's current symptoms, a more  definitive workup with contrast enhanced MRI of the brain may be  Helpful.  Nuclear stress test 04/02/15 Myocardial perfusion is abnormal. Moderate size and intensity, reversible anterior perfusion defect. Findings consistent with ischemia. This is an intermediate risk study. Overall left ventricular systolic function was normal. LV cavity size is normal. The left ventricular ejection fraction is hyperdynamic (>65%). There is no prior study for comparison.  2-D echo - Left ventricle: The cavity size was normal. Wall thickness was normal. Systolic function was normal. The estimated ejection fraction was in the range of 60% to 65%. Doppler parameters are consistent with abnormal left ventricular relaxation (grade 1 diastolic  dysfunction). - Mitral valve: There was mild regurgitation. - Left atrium: The atrium was mildly dilated.  Cardiac cath  Prox RCA lesion, 10% stenosed.  Prox LAD lesion, 25% stenosed. 1. Nonobstructive CAD- mild 2. Normal LV function 3. Normal LVEDP  Lab Review  Component     Latest Ref Rng 08/10/2014 03/14/2015  Cholesterol, Total     100 - 199 mg/dL [id number]   Triglycerides     <150 mg/dL 308135 657153 (H)  HDL Cholesterol     >=46 mg/dL 57 54  VLDL Cholesterol Cal     5 - 40 mg/dL 27   LDL (calc)     0 - 99 mg/dL 74 846113 (H)  Total CHOL/HDL Ratio      2.8 3.7  Cholesterol     0 - 200 mg/dL  [id number]  VLDL     0 - 40 mg/dL  31  TSH     9.5280.450 - 4.1324.500 uIU/mL 2.080   Free T4     0.82 - 1.77 ng/dL 4.401.39   Hemoglobin N0UA1C     4.8 - 5.6 % 6.0 (H)   Est. average glucose Bld gHb Est-mCnc      126   Brain Natriuretic Peptide     0.0 - 100.0 pg/mL  84.7    Assessment    In summary, [name] is a 75 y.o. female with PMH  of HTN, DM, sinus node dysfunction s/p pacemaker who is followed up in clinic for whole body shakiness. 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 After stopping her paxil and cymbalta as well as Xanax and put on clonazepam, her symptoms resolved. She stated that clonazepam also helped her anxiety. She still has numbness and tingling in her toes and fingertips, consistent with DM neuropathy. will benefit from low dose gabapentin.   During the interval time, she started to have dyspnea, chest tightness and orthopnea. Cardiac workup negative. She admitted to have severe depression, anxiety over the death of her daughter and the relationship with her son and husband. 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 She denies any SI or HI. However, due to her previous history of serotonin syndrome with using SSRI, is warranted for her to have a psychiatry consult to consider further  management of depression. Continue Klonopin and gabapentin for now. She saw psychiatrist and put on wellbutrin and increased Klonopin.  However pt had transient right UE and LE numbness and weakness, lasted about 30-8min and resolved. Her 05/2014 head CT concerning for right frontal embolic stroke. She was told by Dr. Ladona Ridgel to have afib but no documentation. Will do stroke work up. Switch from ASA 81 to plavix.  Plan: - switch from ASA 81 to plavix 75mg  dialy for stroke prevention for now. - will do CTA head and neck to evaluate brain vessels. - continue lipitor for stroke prevention - check lipid panel and A1C level today - please let Dr. Ladona Ridgel check the pacemaker on 1/181/7 during next visit to see if there is any afib in the past and document. - continue follow up with cardiology, and psychiatry - check BP at home.  - Follow up with your primary care physician for stroke risk factor modification. Recommend maintain blood pressure goal around130/80, diabetes with hemoglobin A1c goal below 6.5% and lipids with LDL cholesterol goal below 70 mg/dL.  - follow up in 2 months  I spent more than 25 minutes of face to face time with the patient. Greater than 50% of time was spent in counseling and coordination of care. We discussed about risk of stroke due to TIA episode, treatment options, and stroke work up.   Orders Placed This Encounter  Procedures  . CT Angio Neck W/Cm &/Or Wo/Cm    Standing Status: Future     Number of Occurrences:      Standing Expiration Date: 01/12/2017    Order Specific Question:  If indicated for the ordered procedure, I authorize the administration of contrast media per Radiology protocol    Answer:  Yes    Order Specific Question:  Reason for Exam (SYMPTOM  OR DIAGNOSIS REQUIRED)    Answer:  TIA    Order Specific Question:  Preferred imaging location?    Answer:  Internal  . CT Angio Head W/Cm &/Or Wo Cm    Standing Status: Future     Number of Occurrences:       Standing Expiration Date: 01/12/2017    Order Specific Question:  If indicated for the ordered procedure, I authorize the administration of contrast media per Radiology protocol    Answer:  Yes    Order Specific Question:  Reason for Exam (SYMPTOM  OR DIAGNOSIS REQUIRED)    Answer:  TIA    Order Specific Question:  Preferred imaging location?    Answer:  Internal  .  Lipid panel  . Hemoglobin A1c    Meds ordered this encounter  Medications  . buPROPion (WELLBUTRIN XL) 150 MG 24 hr tablet    Sig: take two in the morning    Refill:  3  . clopidogrel (PLAVIX) 75 MG tablet    Sig: Take 1 tablet (75 mg total) by mouth daily.    Dispense:  30 tablet    Refill:  11    Patient Instructions  - switch from ASA 81 to plavix 75mg  dialy for stroke prevention for now. - will do CTA head and neck to evaluate brain vessels. - continue lipitor for stroke prevention - will check lipid panel and A1C level today - please let Dr. Ladona Ridgel check your pacemaker on 1/181/7 during your visit to see if there is any afib in the past and document. - continue follow up with cardiology, and psychiatry - check BP at home.  - Follow up with your primary care physician for stroke risk factor modification. Recommend maintain blood pressure goal around130/80, diabetes with hemoglobin A1c goal below 6.5% and lipids with LDL cholesterol goal below 70 mg/dL.  - follow up in 2 months    Marvel Plan, MD PhD [facility] [address] [place], [place] [address] [phone]

## 2015-11-15 NOTE — Patient Instructions (Addendum)
-   switch from ASA 81 to plavix $RemoveBefor [id number] head and neck to evaluate brain vessels. - continue lipitor for stroke prevention - will check lipid panel and A1C level today - please let Dr. Ladona Ridgelaylor check your pacemaker on 1/181/7 during your visit to see if there is any afib in the past and document. - continue follow up with cardiology, and psychiatry - check BP at home.  - Follow up with your primary care physician for stroke risk factor modification. Recommend maintain blood pressure goal around130/80, diabetes with hemoglobin A1c goal below 6.5% and lipids with LDL cholesterol goal below 70 mg/dL.  - follow up in 2 months

## 2015-11-16 LAB — LIPID PANEL
Chol/HDL Ratio: 3.2 ratio units (ref 0.0–4.4)
Cholesterol, Total: 158 mg/dL (ref 100–199)
HDL: 50 mg/dL (ref >39)
LDL Calculated: 82 mg/dL (ref 0–99)
Triglycerides: 130 mg/dL (ref 0–149)
VLDL Cholesterol Cal: 26 mg/dL (ref 5–40)

## 2015-11-16 LAB — HEMOGLOBIN A1C
Est. average glucose Bld gHb Est-mCnc: 137 mg/dL
Hgb A1c MFr Bld: 6.4 % — ABNORMAL HIGH (ref 4.8–5.6)

## 2015-11-17 ENCOUNTER — Other Ambulatory Visit: Payer: Self-pay | Admitting: Neurology

## 2015-11-18 ENCOUNTER — Telehealth: Payer: Self-pay

## 2015-11-18 ENCOUNTER — Other Ambulatory Visit: Payer: Self-pay

## 2015-11-18 DIAGNOSIS — F329 Major depressive disorder, single episode, unspecified: Secondary | ICD-10-CM

## 2015-11-18 DIAGNOSIS — F411 Generalized anxiety disorder: Secondary | ICD-10-CM

## 2015-11-18 DIAGNOSIS — F32A Depression, unspecified: Secondary | ICD-10-CM

## 2015-11-18 MED ORDER — CLONAZEPAM 0.5 MG PO TABS: 0.5000 mg | ORAL_TABLET | Freq: Two times a day (BID) | ORAL | Status: DC | PRN

## 2015-11-18 NOTE — Telephone Encounter (Signed)
Dr [name] is out of the office.  [facility]

## 2015-11-18 NOTE — Telephone Encounter (Signed)
Patient called back to notify is that her PCP is now refilling her clonazepam.

## 2015-11-18 NOTE — Telephone Encounter (Signed)
I left message that we have faxed a prescription (clonazepam) to [facility] in [place]. Left call back number for any questions.

## 2015-11-18 NOTE — Telephone Encounter (Signed)
-----   Message from Marvel PlanJindong Xu, MD sent at 11/18/2015  9:34 AM EST ----- Could you please let the patient know that the blood test done last week in our office was unremarkable. Please continue current treatment and wait for other tests to be done. Thanks.  Marvel PlanJindong Xu, MD PhD Stroke Neurology 11/18/2015 9:34 AM

## 2015-11-18 NOTE — Telephone Encounter (Signed)
Rn call patient to let her know that the blood work was normal. Pt verbalized understanding. Pt would like a copy sent to her in the mail. Copy mail to patient in the mail, address was verified.

## 2015-11-20 ENCOUNTER — Encounter: Payer: Self-pay | Admitting: Internal Medicine

## 2015-11-20 ENCOUNTER — Ambulatory Visit (INDEPENDENT_AMBULATORY_CARE_PROVIDER_SITE_OTHER): Payer: Medicare Other | Admitting: Internal Medicine

## 2015-11-20 VITALS — BP 122/70 | HR 64 | Ht 66.0 in | Wt 170.0 lb

## 2015-11-20 DIAGNOSIS — I10 Essential (primary) hypertension: Secondary | ICD-10-CM

## 2015-11-20 DIAGNOSIS — Z950 Presence of cardiac pacemaker: Secondary | ICD-10-CM

## 2015-11-20 DIAGNOSIS — G459 Transient cerebral ischemic attack, unspecified: Secondary | ICD-10-CM

## 2015-11-20 DIAGNOSIS — I495 Sick sinus syndrome: Secondary | ICD-10-CM

## 2015-11-20 NOTE — Assessment & Plan Note (Signed)
She is pending a CT scan in a few days. Will follow.

## 2015-11-20 NOTE — Assessment & Plan Note (Signed)
Her blood pressure was well controlled today. She will continue her current meds.

## 2015-11-20 NOTE — Assessment & Plan Note (Signed)
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 Will recheck in several months. She is approaching ERI.

## 2015-11-20 NOTE — Progress Notes (Signed)
HPI [name] returns today for followup. She is a very pleasant 75 year old woman with hypertension and sinus node dysfunction, status post permanent pacemaker insertion. She denies shortness of breath or syncope. At nighttime, she has minimal peripheral edema. She admits to dietary indiscretion. She was in the hospital with serotonin syndrome. She is better. She is approaching ERI.  Allergies  Allergen Reactions  . Other     Pt has a recent bout of Seratonin-Syndrome so patient is unable to take certain medications anymore. Cymbalta, Paxil, Trazodone  . Caffeine     Nervousness   . Codeine Other (See Comments)    hallucinations  . Metformin And Related     Causes dehydration and causes me to go to the hospital     Current Outpatient Prescriptions  Medication Sig Dispense Refill  . amLODipine (NORVASC) 2.5 MG tablet Take 2.5 mg by mouth daily.    Marland Kitchen atorvastatin (LIPITOR) 40 MG tablet Take 40 mg by mouth daily.    Marland Kitchen buPROPion (WELLBUTRIN XL) 150 MG 24 hr tablet take two tablets by mouth in the morning  3  . clonazePAM (KLONOPIN) 0.5 MG tablet Take 1 tablet (0.5 mg total) by mouth 2 (two) times daily as needed for anxiety. 60 tablet 0  . clopidogrel (PLAVIX) 75 MG tablet Take 1 tablet (75 mg total) by mouth daily. 30 tablet 11  . gabapentin (NEURONTIN) 100 MG capsule TAKE 1 CAPSULE BY MOUTH THREE TIMES DAILY 270 capsule 0  . glimepiride (AMARYL) 2 MG tablet Take 1 tablet by mouth 3 (three) times daily with meals. And may take a half a tablet =  a fourth time if blood sugar is still high.  3  . hydrochlorothiazide (HYDRODIURIL) 25 MG tablet Take 25 mg by mouth daily.    Marland Kitchen losartan (COZAAR) 100 MG tablet Take 1 tablet (100 mg total) by mouth daily. 90 tablet 3  . pantoprazole (PROTONIX) 40 MG tablet Take 1 tablet (40 mg total) by mouth 2 (two) times daily before a meal. 60 tablet 2  . FLUZONE HIGH-DOSE 0.5 ML SUSY ADM 0.5ML IM UTD  0  . glucose blood (ONE TOUCH ULTRA TEST) test  strip AS DIRECTED TWICE DAILY    . ONE TOUCH ULTRA TEST test strip   2   No current facility-administered medications for this visit.     Past Medical History  Diagnosis Date  . DM (diabetes mellitus) (HCC)   . Diverticulosis   . Internal and external hemorrhoids without complication   . Arthritis   . Hypertension   . Hyperlipidemia   . Cardiac arrhythmia     now has pacemaker  . Potassium deficiency   . Fibromyalgia   . Allergy   . Cataract   . GERD (gastroesophageal reflux disease)   . Myocardial infarction (HCC)     ROS:   All systems reviewed and negative except as noted in the HPI.   Past Surgical History  Procedure Laterality Date  . Knee surgery      right x 2  . Neck surgery      x2  . Abdominal hysterectomy    . Colonoscopy  multiple  . Pacemaker insertion    . Appendectomy    . Upper gastrointestinal endoscopy    . Bil carple tunnel surgery    . Cardiac catheterization N/A 04/09/2015    Procedure: Left Heart Cath and Coronary Angiography;  Surgeon: Peter M Swaziland, MD;  Location: [facility] INVASIVE CV LAB;  Service: Cardiovascular;  Laterality: N/A;     Family History  Problem Relation Age of Onset  . Coronary artery disease Father   . Prostate cancer Father   . Colon cancer Father   . Lung cancer Father   . Diabetes Father   . Hypertension Neg Hx   . Esophageal cancer Neg Hx   . Rectal cancer Neg Hx   . Stomach cancer Neg Hx   . Diabetes Daughter   . Dementia Daughter   . Breast cancer Maternal Aunt   . Colon cancer Sister     x 2  . Colon cancer Brother   . Lung cancer Mother   . Dementia Mother   . Colon polyps Sister   . Crohn's disease Sister   . Kidney disease Daughter   . Diabetes Cousin     multiple  . Diabetes Maternal Aunt     multiple  . Diabetes Maternal Uncle     multiple  . Irritable bowel syndrome Sister   . Heart attack Daughter   . Heart attack Father   . Heart attack Brother      Social History   Social History  .  Marital Status: Married    Spouse Name: N/A  . Number of Children: 2  . Years of Education: 12   Occupational History  . Retired    Social History Main Topics  . Smoking status: Never Smoker   . Smokeless tobacco: Never Used  . Alcohol Use: No  . Drug Use: No  . Sexual Activity: Not on file   Other Topics Concern  . Not on file   Social History Narrative   Patient is married with 2 children.   Patient is right handed.   Patient has 12 th grade education.   Patient does not drink caffeine.     BP 122/70 mmHg  Pulse 64  Ht  (1.676 m)  Wt 170 lb (77.111 kg)  BMI 27.45 kg/m2  Physical Exam:  Well appearing 75 year old woman, NAD HEENT: Unremarkable Neck:  6 cm JVD, no thyromegally Lungs:  Clear with no wheezes, rales, or rhonchi. HEART:  Regular rate rhythm, no murmurs, no rubs, no clicks Abd:  soft, positive bowel sounds, no organomegally, no rebound, no guarding Ext:  2 plus pulses, no edema, no cyanosis, no clubbing Skin:  No rashes no nodules Neuro:  CN II through XII intact, motor grossly intact  DEVICE  Normal device function.  See PaceArt for details.   Assess/Plan:

## 2015-11-20 NOTE — Patient Instructions (Signed)
Remote monitoring is used to monitor your Pacemaker of ICD from home. This monitoring reduces the number of office visits required to check your device to one time per year. It allows [place] to keep an eye on the functioning of your device to ensure it is working properly. You are scheduled for a device checks from home monthly starting 12/22/15. You may send your transmission at any time that day. If you have a wireless device, the transmission will be sent automatically. After your physician reviews your transmission, you will receive a postcard with your next transmission date.  Your physician wants you to follow-up in: 1 year with Dr. Ladona Ridgel.  You will receive a reminder letter in the mail two months in advance. If you don't receive a letter, please call our office to schedule the follow-up appointment.

## 2015-11-22 ENCOUNTER — Ambulatory Visit
Admission: RE | Admit: 2015-11-22 | Discharge: 2015-11-22 | Disposition: A | Discharge status: Home / Self Care | Payer: Medicare Other | Source: Ambulatory Visit | Attending: Neurology | Admitting: Neurology

## 2015-11-22 DIAGNOSIS — G451 Carotid artery syndrome (hemispheric): Secondary | ICD-10-CM

## 2015-11-22 MED ORDER — IOPAMIDOL (ISOVUE-370) INJECTION 76%
100.0000 mL | Freq: Once | INTRAVENOUS | Status: AC | PRN
  Administered 2015-11-22: 100 mL via INTRAVENOUS

## 2015-11-25 ENCOUNTER — Telehealth: Payer: Self-pay

## 2015-11-25 NOTE — Telephone Encounter (Signed)
Rn call patient to notify her that the CT angiogram head and neck was normal and unremarkable. No large or medium vessel blockage. Please continue treatment plan. PT stated she saw her cardiologist last week and the pacemaker was check for atrial fib. Pt stated per her cardiologist did not find any atrial fib with her pacemaker.Patien verbalized understanding.

## 2015-11-25 NOTE — Telephone Encounter (Signed)
-----   Message from Marvel Plan, MD sent at 11/24/2015  9:12 PM EST ----- Could you please let the patient know that the CT angiogram head and neck done recent was unremarkable. No large or medium vessel blockage. Please continue current treatment. Also ask her whether her pacemaker has been checked recently for afib. Thanks.  Marvel Plan, MD PhD Stroke Neurology 11/24/2015 9:12 PM

## 2015-11-26 LAB — CUP PACEART INCLINIC DEVICE CHECK
Battery Impedance: 3761 Ohm
Battery Remaining Longevity: 11 mo
Battery Voltage: 2.67 V
Brady Statistic AP VP Percent: 0 %
Brady Statistic AP VS Percent: 98 %
Brady Statistic AS VP Percent: 0 %
Brady Statistic AS VS Percent: 2 %
Date Time Interrogation Session: 20170118182143
Implantable Lead Implant Date: 20070910
Implantable Lead Implant Date: 20070910
Implantable Lead Location: 753859
Implantable Lead Location: 753860
Implantable Lead Model: 5076
Implantable Lead Model: 5076
Lead Channel Impedance Value: 502 Ohm
Lead Channel Impedance Value: 576 Ohm
Lead Channel Pacing Threshold Amplitude: 0.5 V
Lead Channel Pacing Threshold Amplitude: 1 V
Lead Channel Pacing Threshold Pulse Width: 0.4 ms
Lead Channel Pacing Threshold Pulse Width: 0.4 ms
Lead Channel Sensing Intrinsic Amplitude: 2.8 mV
Lead Channel Sensing Intrinsic Amplitude: 22.4 mV
Lead Channel Setting Pacing Amplitude: 2 V
Lead Channel Setting Pacing Amplitude: 2.5 V
Lead Channel Setting Pacing Pulse Width: 0.4 ms
Lead Channel Setting Sensing Sensitivity: 5.6 mV

## 2015-12-10 ENCOUNTER — Ambulatory Visit (INDEPENDENT_AMBULATORY_CARE_PROVIDER_SITE_OTHER): Payer: Medicare Other | Admitting: Neurology

## 2015-12-10 ENCOUNTER — Encounter: Payer: Self-pay | Admitting: Neurology

## 2015-12-10 VITALS — BP 130/70 | HR 63 | Ht 66.0 in | Wt 166.2 lb

## 2015-12-10 DIAGNOSIS — I10 Essential (primary) hypertension: Secondary | ICD-10-CM

## 2015-12-10 DIAGNOSIS — E119 Type 2 diabetes mellitus without complications: Secondary | ICD-10-CM

## 2015-12-10 DIAGNOSIS — G4700 Insomnia, unspecified: Secondary | ICD-10-CM | POA: Diagnosis not present

## 2015-12-10 DIAGNOSIS — F329 Major depressive disorder, single episode, unspecified: Secondary | ICD-10-CM

## 2015-12-10 DIAGNOSIS — F411 Generalized anxiety disorder: Secondary | ICD-10-CM

## 2015-12-10 DIAGNOSIS — G451 Carotid artery syndrome (hemispheric): Secondary | ICD-10-CM

## 2015-12-10 DIAGNOSIS — F32A Depression, unspecified: Secondary | ICD-10-CM

## 2015-12-10 NOTE — Progress Notes (Signed)
NEUROLOGY CLINIC FOLLOW UP PATIENT NOTE  NAME: [name] DOB: October 04, 1941  History taken from chart and pt  She was accompanied by no one today in clinic.  History summary: [name] is a 75 y.o. female with PMH of HTN, DM, sinus node dysfunction s/p acemaker who presents on 08/10/14 as a new patient for whole body shakiness. Pt stated that she had a daughter with hx of early onset dementia, multiple strokes, seizure, WM disease, DM, failed kidney transplant, uterus cancer s/p hysterectomy, bedridden. She had been taking care of her for the last 30 years. Daughter eventually died in 27-Mar-2014 at age of 26. She was depressed and was put on paxil and cymbalta about 3-4 months ago. She was also put on xanax at night for sleep. For the last 6-8 weeks also, she noticed that she stated to have shakiness of her body, gradually getting worse, more hyperactivity and shaky, muscle jumping, feeling of on-the-go. After Xanax, she could sleep for few hours and then woke up in the middle of night, shaking all over. For the last week, it was way worse and she woke up at night feel chest tightness and she afraid of heart attack. She went to ER on 08/08/14 and had CT done which was negative for acute abnormalities and troponin negative. She was told to follow up with neurology as outpt. She denies any weaknss, numbness, LOC, seizure like activity, speech or vision changes.  She has hx of HTN on lisinopril, norvasc, and HCTZ. Her BP today is 123/65. She is also on amaryl for DM. She has FM of dementia. Besides her daughter, her two sisters had dementia and died at their 47s. She denies smoking, alcohol or illicit drugs.  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 Her paxil and cymbalta were stopped along with Xanax, and I put her on clonazepam for treatment.   Follow up 09/03/14 - she was doing well. The shakiness of body was gone, she felt more relaxed and no more muscle jumping. She was able  to sleep well. She tolerated clonazepam well and stated it helped her anxiety too. Her blood testing including A1C, LDL and TSH, free T4 were all normal. She stated that she still has intermittent numbness and tingling feelings in her fingers and toes. She has not been on gabapentin before. She stated that her sugar level is not in excellent control, ranging from 160s to 200s but her A1C last time we checked was 6.0, indicating good control. Her BP today is 148/81.   07/24/15 follow up - she had been doing well until this 2023-03-28 when she developed chest discomfort and dyspnea and orthopnea. In the past, pt has been following with Dr. Ladona Ridgel in EP for sinus node dysfunction and had pacemaker placement in Mar 28, 2011. Therefore, she was seen by cardiology and had several tests done. Her nuclear stress test done on 04/02/15 showed abnormal and indicating anterior wall ischemia. She then had 2D echo done showed EF 60-65% and cardiac cath done showed almost no stenosis of coronary arteries with normal LV function and normal EDP. BNP is normal. She was then considered non-cardiac related symptoms.   By detailed questioning, she admitted that she is depressed with anxiety. She still not able to get over with her daughter's death one year ago. She regret that she made several mistakes over her cemetery and miss her everyday. She is in tears throughout the clinic visit. She stated that she has a son who never  contacted her since long time ago and denied her visit to her granddaughter although the son's family lives just in [place]. She married to her husband who is 15 yo and not father of her son and daughter. 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 She feels bored and empty.   Follow up 11/15/15 - pt had psychiatry follow up and was put on wellbutrin and increased Klonopin dose for depression and anxiety. She felt somehow better symptoms. She has appointment again with psychiatrist on  12/17/15.  However, she stated that about 2 weeks ago, she was talking with friend and had sudden onset right arm numbness, weakness, not able to move right arm, not able to walk well due to right leg weakness and leaning towards right on walking. Able to talk, no facial involvement. Symptoms lasted about 30-72min and resolved. Her CT in 05/2014 questionable for right frontal WM infarct. Not able to have MRI due to pacemaker. She stated that she was told to have afib during stress test last year by Dr. Ladona Ridgel. However, her cardiologist Dr. Katrinka Blazing has not find any documentation for afib. Pt has appointment with Dr. Ladona Ridgel next week   Interval history: During the interval time, the patient has CTA head and neck which were unremarkable. Follow up with Dr. Ladona Ridgel in cardiology, had pacemaker interrogation which did not show any A. fib episodes as per patient. Recheck LDL 82 and A1c 6.4. Blood pressure 130/70 in clinic today.  However, patient complains of insomnia. She had no problem of falling into sleep, however, has problem with maintaining sleep. She usually wakes up at 2am and not able to get back to sleep. Follow up with psychiatry and psychology, not effective. Still on Wellbutrin and Klonopin for depression and anxiety.   Past Medical History  Diagnosis Date  . DM (diabetes mellitus) (HCC)   . Diverticulosis   . Internal and external hemorrhoids without complication   . Arthritis   . Hypertension   . Hyperlipidemia   . Cardiac arrhythmia     now has pacemaker  . Potassium deficiency   . Fibromyalgia   . Allergy   . Cataract   . GERD (gastroesophageal reflux disease)   . Myocardial infarction [facility])    Past Surgical History  Procedure Laterality Date  . Knee surgery      right x 2  . Neck surgery      x2  . Abdominal hysterectomy    . Colonoscopy  multiple  . Pacemaker insertion    . Appendectomy    . Upper gastrointestinal endoscopy    . Bil carple tunnel surgery    . Cardiac  catheterization N/A 04/09/2015    Procedure: Left Heart Cath and Coronary Angiography;  Surgeon: Peter M Swaziland, MD;  Location: [facility] INVASIVE CV LAB;  Service: Cardiovascular;  Laterality: N/A;   Family History  Problem Relation Age of Onset  . Coronary artery disease Father   . Prostate cancer Father   . Colon cancer Father   . Lung cancer Father   . Diabetes Father   . Hypertension Neg Hx   . Esophageal cancer Neg Hx   . Rectal cancer Neg Hx   . Stomach cancer Neg Hx   . Diabetes Daughter   . Dementia Daughter   . Breast cancer Maternal Aunt   . Colon cancer Sister     x 2  . Colon cancer Brother   .  Lung cancer Mother   . Dementia Mother   . Colon polyps Sister   . Crohn's disease Sister   . Kidney disease Daughter   . Diabetes Cousin     multiple  . Diabetes Maternal Aunt     multiple  . Diabetes Maternal Uncle     multiple  . Irritable bowel syndrome Sister   . Heart attack Daughter   . Heart attack Father   . Heart attack Brother    Current Outpatient Prescriptions  Medication Sig Dispense Refill  . amLODipine (NORVASC) 2.5 MG tablet Take 2.5 mg by mouth daily.    Marland Kitchen atorvastatin (LIPITOR) 40 MG tablet Take 40 mg by mouth daily.    Marland Kitchen buPROPion (WELLBUTRIN XL) 150 MG 24 hr tablet take two tablets by mouth in the morning  3  . clonazePAM (KLONOPIN) 0.5 MG tablet Take 1 tablet (0.5 mg total) by mouth 2 (two) times daily as needed for anxiety. (Patient taking differently: Take 0.5 mg by mouth 2 (two) times daily as needed for anxiety. One in the am and two at night) 60 tablet 0  . clopidogrel (PLAVIX) 75 MG tablet Take 1 tablet (75 mg total) by mouth daily. 30 tablet 11  . FLUZONE HIGH-DOSE 0.5 ML SUSY ADM 0.5ML IM UTD  0  . gabapentin (NEURONTIN) 100 MG capsule TAKE 1 CAPSULE BY MOUTH THREE TIMES DAILY 270 capsule 0  . glimepiride (AMARYL) 2 MG tablet Take 1 tablet by mouth 3 (three) times daily with meals. And may take a half a tablet =  a fourth time if blood sugar is  still high.  3  . glucose blood (ONE TOUCH ULTRA TEST) test strip AS DIRECTED TWICE DAILY    . hydrochlorothiazide (HYDRODIURIL) 25 MG tablet Take 25 mg by mouth daily.    Marland Kitchen losartan (COZAAR) 100 MG tablet Take 1 tablet (100 mg total) by mouth daily. 90 tablet 3  . ONE TOUCH ULTRA TEST test strip   2  . pantoprazole (PROTONIX) 40 MG tablet Take 1 tablet (40 mg total) by mouth 2 (two) times daily before a meal. 60 tablet 2   No current facility-administered medications for this visit.   Allergies  Allergen Reactions  . Other     Pt has a recent bout of Seratonin-Syndrome so patient is unable to take certain medications anymore. Cymbalta, Paxil, Trazodone  . Caffeine     Nervousness   . Codeine Other (See Comments)    hallucinations  . Metformin And Related     Causes dehydration and causes me to go to the hospital   Social History   Social History  . Marital Status: Married    Spouse Name: N/A  . Number of Children: 2  . Years of Education: 12   Occupational History  . Retired    Social History Main Topics  . Smoking status: Never Smoker   . Smokeless tobacco: Never Used  . Alcohol Use: No  . Drug Use: No  . Sexual Activity: Not on file   Other Topics Concern  . Not on file   Social History Narrative   Patient is married with 2 children.   Patient is right handed.   Patient has 12 th grade education.   Patient does not drink caffeine.    Review of Systems Full 14 system review of systems performed and notable only for those listed, all others are neg:  Constitutional:   Cardiovascular:   Ear/Nose/Throat: N/A  Skin: N/A  Eyes:   Respiratory:   Gastroitestinal:   Hematology/Lymphatic:   Endocrine:  Musculoskeletal:  Allergy/Immunology: N/A  Neurological:  Psychiatric: Depression, nervous, anxious Sleep: insomnia   Physical Exam  Filed Vitals:   12/10/15 0901  BP: 130/70  Pulse: 63    General - Well nourished, well developed, in no acute distress,  however anxious with depressed mood.  Ophthalmologic - fundi not visualized due to noncorporation.  Cardiovascular - Regular rate and rhythm with no murmur. Carotid pulses were 2+ without bruits .   Neck - supple, no nuchal rigidity.  Mental Status -  Level of arousal and orientation to time, place, and person were intact. Language including expression, naming, repetition, comprehension, reading, and writing was assessed and found intact.  Cranial Nerves II - XII - II - Visual field intact OU. III, IV, VI - Extraocular movements intact. V - Facial sensation intact bilaterally. VII - Facial movement intact bilaterally.  VIII - Hearing & vestibular intact bilaterally. X - Palate elevates symmetrically. XI - Chin turning & shoulder shrug intact bilaterally. XII - Tongue protrusion intact.  Motor Strength - The patient's strength was normal in all extremities and pronator drift was absent.  Bulk was normal and fasciculations were absent.   Motor Tone - Muscle tone was assessed at the neck and appendages and was normal.  Reflexes - The patient's reflexes were normal in all extremities and she had no pathological reflexes.  Sensory - Light touch, temperature/pinprick were assessed and were normal.    Coordination - The patient had normal movements in the hands and feet with no ataxia or dysmetria.   Gait and Station - The patient's transfers, posture, gait, station, and turns were observed as normal.   Imaging CT head 08/08/14 -  1. No acute intracranial abnormalities.  2. Nonspecific area of low attenuation at the right white matter  junction the posterior right frontal lobe is likely related to prior  infarct. In light of the patient's current symptoms, a more  definitive workup with contrast enhanced MRI of the brain may be  Helpful.  Nuclear stress test 04/02/15 Myocardial perfusion is abnormal. Moderate size and intensity, reversible anterior perfusion defect. Findings  consistent with ischemia. This is an intermediate risk study. Overall left ventricular systolic function was normal. LV cavity size is normal. The left ventricular ejection fraction is hyperdynamic (>65%). There is no prior study for comparison.  2-D echo - Left ventricle: The cavity size was normal. Wall thickness was normal. Systolic function was normal. The estimated ejection fraction was in the range of 60% to 65%. Doppler parameters are consistent with abnormal left ventricular relaxation (grade 1 diastolic dysfunction). - Mitral valve: There was mild regurgitation. - Left atrium: The atrium was mildly dilated.  Cardiac cath  Prox RCA lesion, 10% stenosed.  Prox LAD lesion, 25% stenosed. 1. Nonobstructive CAD- mild 2. Normal LV function 3. Normal LVEDP  CTA head and neck 11/22/2015 No evidence of stenosis of either carotid artery. Slight irregularity and possible mild narrowing vertebral arteries at the level of the foramen magnum however, streak artifact from dental work may contribute to this appearance.  Lab Review  Component     Latest Ref Rng 08/10/2014 03/14/2015  Cholesterol, Total     100 - 199 mg/dL 409   Triglycerides     <150 mg/dL 811 914 (H)  HDL Cholesterol     >=46 mg/dL 57 54  VLDL Cholesterol Cal     5 - 40 mg/dL 27  LDL (calc)     0 - 99 mg/dL 74 098 (H)  Total CHOL/HDL Ratio      2.8 3.7  Cholesterol     0 - 200 mg/dL  119  VLDL     0 - 40 mg/dL  31  TSH     1.478 - 2.956 uIU/mL 2.080   Free T4     0.82 - 1.77 ng/dL 2.13   Hemoglobin Y8M     4.8 - 5.6 % 6.0 (H)   Est. average glucose Bld gHb Est-mCnc      126   Brain Natriuretic Peptide     0.0 - 100.0 pg/mL  84.7   Component     Latest Ref Rng 11/15/2015  Cholesterol, Total     100 - 199 mg/dL 578  Triglycerides     0 - 149 mg/dL 469  HDL Cholesterol     >39 mg/dL 50  VLDL Cholesterol Cal     5 - 40 mg/dL 26  LDL (calc)     0 - 99 mg/dL 82  Total CHOL/HDL Ratio      0.0 - 4.4 ratio units 3.2  Hemoglobin A1C     4.8 - 5.6 % 6.4 (H)  Est. average glucose Bld gHb Est-mCnc      137    Assessment    In summary, [name] is a 75 y.o. female with PMH of HTN, DM, sinus node dysfunction s/p pacemaker who is followed up in clinic for whole body shakiness. 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 After stopping her paxil and cymbalta as well as Xanax and put on clonazepam, her symptoms resolved. She stated that clonazepam also helped her anxiety. She still has numbness and tingling in her toes and fingertips, consistent with DM neuropathy. will benefit from low dose gabapentin.   During the interval time, she started to have dyspnea, chest tightness and orthopnea. Cardiac workup negative. She admitted to have severe depression, anxiety over the death of her daughter and the relationship with her son and husband. 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 She denies any SI or HI. However, due to her previous history of serotonin syndrome with using SSRI, referred to psychiatrist and put on wellbutrin and increased Klonopin.  However pt had transient right UE and LE numbness and weakness, lasted about 30-40min and resolved. Her 05/2014 head CT concerning for right frontal embolic stroke. CTA head and neck unremarkable, LDL 82 and A1C 6.4. Pacemaker interrogation by Dr. Ladona Ridgel showed no afib as per patient. Switched from ASA 81 to plavix.   Plan: - continue plavix and lipitor for stroke prevention - continue follow up with cardiology, psychology and psychiatry - Patient was educated with relaxation skills for insomnia. - check BP at home.  - Follow up with your primary care physician for stroke risk factor modification. Recommend maintain blood pressure goal around130/80, diabetes with hemoglobin A1c goal below 6.5% and lipids with LDL cholesterol goal below 70 mg/dL.     - follow up as needed.  I spent more than 25 minutes of face to face time with the patient. Greater than 50% of time was spent in counseling and coordination of care. We discussed about relaxation skills for insomnia, and the follow-up with psychiatry.   No orders of the defined types were placed in this encounter.    No orders of the defined types were placed in  this encounter.    Patient Instructions  - continue plavix and lipitor for stroke prevention - continue follow up with cardiology, psychology and psychiatry - check BP at home.  - Follow up with your primary care physician for stroke risk factor modification. Recommend maintain blood pressure goal around130/80, diabetes with hemoglobin A1c goal below 6.5% and lipids with LDL cholesterol goal below 70 mg/dL.  - follow up as needed.    Marvel Plan, MD PhD [facility], [facility] [address] [place], [place] [address] [phone]

## 2015-12-10 NOTE — Patient Instructions (Signed)
-   continue plavix and lipitor for stroke prevention - continue follow up with cardiology, psychology and psychiatry - check BP at home.  - Follow up with your primary care physician for stroke risk factor modification. Recommend maintain blood pressure goal around130/80, diabetes with hemoglobin A1c goal below 6.5% and lipids with LDL cholesterol goal below 70 mg/dL.  - follow up as needed.

## 2015-12-23 ENCOUNTER — Ambulatory Visit (INDEPENDENT_AMBULATORY_CARE_PROVIDER_SITE_OTHER): Payer: Medicare Other | Admitting: *Deleted

## 2015-12-23 DIAGNOSIS — Z950 Presence of cardiac pacemaker: Secondary | ICD-10-CM

## 2015-12-23 NOTE — Progress Notes (Signed)
Remote pacemaker transmission.   

## 2016-01-05 ENCOUNTER — Encounter: Payer: Self-pay | Admitting: Cardiology

## 2016-01-05 LAB — CUP PACEART REMOTE DEVICE CHECK
Battery Impedance: 4558 Ohm
Battery Remaining Longevity: 6 mo
Battery Voltage: 2.64 V
Brady Statistic AP VP Percent: 0 %
Brady Statistic AP VS Percent: 98 %
Brady Statistic AS VP Percent: 0 %
Brady Statistic AS VS Percent: 2 %
Date Time Interrogation Session: 20170220133027
Implantable Lead Implant Date: 20070910
Implantable Lead Implant Date: 20070910
Implantable Lead Location: 753859
Implantable Lead Location: 753860
Implantable Lead Model: 5076
Implantable Lead Model: 5076
Lead Channel Impedance Value: 513 Ohm
Lead Channel Impedance Value: 595 Ohm
Lead Channel Setting Pacing Amplitude: 2 V
Lead Channel Setting Pacing Amplitude: 2.5 V
Lead Channel Setting Pacing Pulse Width: 0.4 ms
Lead Channel Setting Sensing Sensitivity: 5.6 mV

## 2016-01-05 NOTE — Progress Notes (Signed)
Remote battery check only Est longevity 6 months [facility] 01/22/16

## 2016-01-20 ENCOUNTER — Ambulatory Visit: Payer: Medicare Other | Admitting: Neurology

## 2016-01-22 ENCOUNTER — Encounter: Payer: Self-pay | Admitting: Cardiology

## 2016-01-23 ENCOUNTER — Ambulatory Visit (INDEPENDENT_AMBULATORY_CARE_PROVIDER_SITE_OTHER): Payer: Medicare Other | Admitting: *Deleted

## 2016-01-23 DIAGNOSIS — Z950 Presence of cardiac pacemaker: Secondary | ICD-10-CM

## 2016-01-23 NOTE — Progress Notes (Signed)
Remote pacemaker transmission.   

## 2016-01-30 ENCOUNTER — Encounter: Payer: Self-pay | Admitting: Internal Medicine

## 2016-02-20 LAB — CUP PACEART REMOTE DEVICE CHECK
Battery Impedance: 5819 Ohm
Battery Remaining Longevity: 1 mo
Battery Voltage: 2.62 V
Brady Statistic AP VP Percent: 0 %
Brady Statistic AP VS Percent: 99 %
Brady Statistic AS VP Percent: 0 %
Brady Statistic AS VS Percent: 1 %
Date Time Interrogation Session: 20170323121302
Implantable Lead Implant Date: 20070910
Implantable Lead Implant Date: 20070910
Implantable Lead Location: 753859
Implantable Lead Location: 753860
Implantable Lead Model: 5076
Implantable Lead Model: 5076
Lead Channel Impedance Value: 526 Ohm
Lead Channel Impedance Value: 638 Ohm
Lead Channel Setting Pacing Amplitude: 2 V
Lead Channel Setting Pacing Amplitude: 2.5 V
Lead Channel Setting Pacing Pulse Width: 0.4 ms
Lead Channel Setting Sensing Sensitivity: 5.6 mV

## 2016-02-21 ENCOUNTER — Encounter: Payer: Self-pay | Admitting: Cardiology

## 2016-02-21 ENCOUNTER — Other Ambulatory Visit: Payer: Self-pay

## 2016-02-21 DIAGNOSIS — Z1231 Encounter for screening mammogram for malignant neoplasm of breast: Secondary | ICD-10-CM

## 2016-02-24 ENCOUNTER — Ambulatory Visit (INDEPENDENT_AMBULATORY_CARE_PROVIDER_SITE_OTHER): Payer: Medicare Other | Admitting: *Deleted

## 2016-02-24 DIAGNOSIS — I495 Sick sinus syndrome: Secondary | ICD-10-CM | POA: Diagnosis not present

## 2016-02-24 DIAGNOSIS — Z950 Presence of cardiac pacemaker: Secondary | ICD-10-CM | POA: Diagnosis not present

## 2016-02-24 NOTE — Progress Notes (Signed)
Remote pacemaker transmission.   

## 2016-02-28 ENCOUNTER — Encounter: Payer: Self-pay | Admitting: Cardiology

## 2016-03-18 ENCOUNTER — Ambulatory Visit
Admission: RE | Admit: 2016-03-18 | Discharge: 2016-03-18 | Disposition: A | Discharge status: Home / Self Care | Payer: Medicare Other | Source: Ambulatory Visit

## 2016-03-18 DIAGNOSIS — Z1231 Encounter for screening mammogram for malignant neoplasm of breast: Secondary | ICD-10-CM

## 2016-03-26 ENCOUNTER — Encounter: Payer: Self-pay | Admitting: *Deleted

## 2016-03-26 ENCOUNTER — Telehealth: Payer: Self-pay | Admitting: *Deleted

## 2016-03-26 ENCOUNTER — Ambulatory Visit (INDEPENDENT_AMBULATORY_CARE_PROVIDER_SITE_OTHER): Payer: Medicare Other | Admitting: *Deleted

## 2016-03-26 ENCOUNTER — Telehealth: Payer: Self-pay | Admitting: Cardiology

## 2016-03-26 DIAGNOSIS — Z950 Presence of cardiac pacemaker: Secondary | ICD-10-CM

## 2016-03-26 LAB — CUP PACEART REMOTE DEVICE CHECK
Battery Impedance: 7383 Ohm
Battery Remaining Longevity: 1 mo
Battery Voltage: 2.59 V
Brady Statistic RV Percent Paced: 0 %
Date Time Interrogation Session: 20170525100716
Implantable Lead Implant Date: 20070910
Implantable Lead Implant Date: 20070910
Implantable Lead Location: 753859
Implantable Lead Location: 753860
Implantable Lead Model: 5076
Implantable Lead Model: 5076
Lead Channel Impedance Value: 580 Ohm
Lead Channel Impedance Value: 67 Ohm
Lead Channel Setting Pacing Amplitude: 2.5 V
Lead Channel Setting Pacing Pulse Width: 0.4 ms
Lead Channel Setting Sensing Sensitivity: 5.6 mV

## 2016-03-26 NOTE — Telephone Encounter (Signed)
I called [name] to make her aware of today's PPM transmission alerting us of elective replacement indicated 02/27/16. She is aware that the device has 3 months left on the battery as of 02/27/16 and will be getting a call from scheduling to arrange an appt with Dr. Ladona Ridgelaylor to discuss generator replacement.

## 2016-03-26 NOTE — Telephone Encounter (Signed)
Spoke with pt and reminded pt of remote transmission that is due today. Pt verbalized understanding.   

## 2016-03-26 NOTE — Progress Notes (Signed)
Remote pacemaker transmission.   

## 2016-04-01 ENCOUNTER — Encounter: Payer: Self-pay | Admitting: Internal Medicine

## 2016-04-01 ENCOUNTER — Ambulatory Visit (INDEPENDENT_AMBULATORY_CARE_PROVIDER_SITE_OTHER): Payer: Medicare Other | Admitting: Internal Medicine

## 2016-04-01 VITALS — BP 142/86 | HR 66 | Ht 66.0 in | Wt 171.1 lb

## 2016-04-01 DIAGNOSIS — I495 Sick sinus syndrome: Secondary | ICD-10-CM

## 2016-04-01 LAB — CBC WITH DIFFERENTIAL/PLATELET
Basophils Absolute: 0 cells/uL (ref 0–200)
Basophils Relative: 0 %
Eosinophils Absolute: 106 cells/uL (ref 15–500)
Eosinophils Relative: 2 %
HCT: 39.1 % (ref 35.0–45.0)
Hemoglobin: 13.3 g/dL (ref 11.7–15.5)
Lymphocytes Relative: 30 %
Lymphs Abs: 1590 cells/uL (ref 850–3900)
MCH: 30.8 pg (ref 27.0–33.0)
MCHC: 34 g/dL (ref 32.0–36.0)
MCV: 90.5 fL (ref 80.0–100.0)
MPV: 10.1 fL (ref 7.5–12.5)
Monocytes Absolute: 742 cells/uL (ref 200–950)
Monocytes Relative: 14 %
Neutro Abs: 2862 cells/uL (ref 1500–7800)
Neutrophils Relative %: 54 %
Platelets: 248 10*3/uL (ref 140–400)
RBC: 4.32 MIL/uL (ref 3.80–5.10)
RDW: 13.5 % (ref 11.0–15.0)
WBC: 5.3 10*3/uL (ref 3.8–10.8)

## 2016-04-01 LAB — CUP PACEART INCLINIC DEVICE CHECK
Battery Impedance: 7686 Ohm
Battery Voltage: 2.59 V
Brady Statistic RV Percent Paced: 0 %
Date Time Interrogation Session: 20170531101216
Implantable Lead Implant Date: 20070910
Implantable Lead Implant Date: 20070910
Implantable Lead Location: 753859
Implantable Lead Location: 753860
Implantable Lead Model: 5076
Implantable Lead Model: 5076
Lead Channel Impedance Value: 551 Ohm
Lead Channel Impedance Value: 67 Ohm
Lead Channel Setting Pacing Amplitude: 2.5 V
Lead Channel Setting Pacing Pulse Width: 0.4 ms
Lead Channel Setting Sensing Sensitivity: 5.6 mV

## 2016-04-01 LAB — BASIC METABOLIC PANEL
BUN: 13 mg/dL (ref 7–25)
CO2: 28 mmol/L (ref 20–31)
Calcium: 9.2 mg/dL (ref 8.6–10.4)
Chloride: 102 mmol/L (ref 98–110)
Creat: 0.92 mg/dL (ref 0.60–0.93)
Glucose, Bld: 95 mg/dL (ref 65–99)
Potassium: 4.1 mmol/L (ref 3.5–5.3)
Sodium: 140 mmol/L (ref 135–146)

## 2016-04-01 NOTE — Patient Instructions (Addendum)
Medication Instructions:  Your physician recommends that you continue on your current medications as directed. Please refer to the Current Medication list given to you today.   Labwork: Your physician recommends that you return for lab work today: BMP/CBC   Testing/Procedures: Pacemaker generator change on 04/09/16 with Dr Ladona Ridgelaylor  [facility] or DRINK after midnight the night prior to your surgery Do not take any medications the morning of the procedure Plan for one night stay at the hospital Take your last dose of PLAVIX on 04/06/16  Follow-Up: Your physician recommends that you schedule a follow-up appointment in: 10-14 days from 04/09/16 in device clinic and 3 months from 04/09/16 with Dr Ladona Ridgelaylor   Any Other Special Instructions Will Be Listed Below (If Applicable).     If you need a refill on your cardiac medications before your next appointment, please call your pharmacy.

## 2016-04-01 NOTE — Progress Notes (Signed)
HPI [name] returns today for followup. She is a very pleasant 75 year old woman with hypertension and sinus node dysfunction, status post permanent pacemaker insertion. She has felt poorly over the past few weeks, corresponding to her PPM reaching ERI. She has had sob and chest pressure. She is pacing VVI 65. Allergies  Allergen Reactions  . Other     Pt has a recent bout of Seratonin-Syndrome so patient is unable to take certain medications anymore. Cymbalta, Paxil, Trazodone  . Caffeine     Nervousness   . Codeine Other (See Comments)    hallucinations  . Metformin And Related     Causes dehydration and causes me to go to the hospital     Current Outpatient Prescriptions  Medication Sig Dispense Refill  . amLODipine (NORVASC) 2.5 MG tablet Take 2.5 mg by mouth daily.    Marland Kitchen atorvastatin (LIPITOR) 40 MG tablet Take 40 mg by mouth daily.    Marland Kitchen buPROPion (WELLBUTRIN XL) 150 MG 24 hr tablet take two tablets by mouth in the morning  3  . clonazePAM (KLONOPIN) 0.5 MG tablet Take 0.5 mg by mouth as directed.    . clopidogrel (PLAVIX) 75 MG tablet Take 1 tablet (75 mg total) by mouth daily. 30 tablet 11  . FLUZONE HIGH-DOSE 0.5 ML SUSY ADM 0.5ML IM UTD  0  . gabapentin (NEURONTIN) 100 MG capsule TAKE 1 CAPSULE BY MOUTH THREE TIMES DAILY 270 capsule 0  . glimepiride (AMARYL) 2 MG tablet Take 1 tablet by mouth 3 (three) times daily with meals. And may take a half a tablet =  a fourth time if blood sugar is still high.  3  . glucose blood (ONE TOUCH ULTRA TEST) test strip AS DIRECTED TWICE DAILY    . hydrochlorothiazide (HYDRODIURIL) 25 MG tablet Take 25 mg by mouth daily.    Marland Kitchen losartan (COZAAR) 100 MG tablet Take 1 tablet (100 mg total) by mouth daily. 90 tablet 3  . ONE TOUCH ULTRA TEST test strip   2  . pantoprazole (PROTONIX) 40 MG tablet Take 1 tablet (40 mg total) by mouth 2 (two) times daily before a meal. 60 tablet 2   No current facility-administered medications for this  visit.     Past Medical History  Diagnosis Date  . DM (diabetes mellitus) (HCC)   . Diverticulosis   . Internal and external hemorrhoids without complication   . Arthritis   . Hypertension   . Hyperlipidemia   . Cardiac arrhythmia     now has pacemaker  . Potassium deficiency   . Fibromyalgia   . Allergy   . Cataract   . GERD (gastroesophageal reflux disease)   . Myocardial infarction (HCC)     ROS:   All systems reviewed and negative except as noted in the HPI.   Past Surgical History  Procedure Laterality Date  . Knee surgery      right x 2  . Neck surgery      x2  . Abdominal hysterectomy    . Colonoscopy  multiple  . Pacemaker insertion    . Appendectomy    . Upper gastrointestinal endoscopy    . Bil carple tunnel surgery    . Cardiac catheterization N/A 04/09/2015    Procedure: Left Heart Cath and Coronary Angiography;  Surgeon: Peter M Swaziland, MD;  Location: [facility] INVASIVE CV LAB;  Service: Cardiovascular;  Laterality: N/A;     Family History  Problem Relation Age of Onset  . Coronary artery  disease Father   . Prostate cancer Father   . Colon cancer Father   . Lung cancer Father   . Diabetes Father   . Hypertension Neg Hx   . Esophageal cancer Neg Hx   . Rectal cancer Neg Hx   . Stomach cancer Neg Hx   . Diabetes Daughter   . Dementia Daughter   . Breast cancer Maternal Aunt   . Colon cancer Sister     x 2  . Colon cancer Brother   . Lung cancer Mother   . Dementia Mother   . Colon polyps Sister   . Crohn's disease Sister   . Kidney disease Daughter   . Diabetes Cousin     multiple  . Diabetes Maternal Aunt     multiple  . Diabetes Maternal Uncle     multiple  . Irritable bowel syndrome Sister   . Heart attack Daughter   . Heart attack Father   . Heart attack Brother      Social History   Social History  . Marital Status: Married    Spouse Name: N/A  . Number of Children: 2  . Years of Education: 12   Occupational History  .  Retired    Social History Main Topics  . Smoking status: Never Smoker   . Smokeless tobacco: Never Used  . Alcohol Use: No  . Drug Use: No  . Sexual Activity: Not on file   Other Topics Concern  . Not on file   Social History Narrative   Patient is married with 2 children.   Patient is right handed.   Patient has 12 th grade education.   Patient does not drink caffeine.     BP 142/86 mmHg  Pulse 66  Ht 5\' 6"  (1.676 m)  Wt 171 lb 1.9 oz (77.62 kg)  BMI 27.63 kg/m2  Physical Exam:  Well appearing 75 year old woman, NAD HEENT: Unremarkable Neck:  6 cm JVD, no thyromegally Lungs:  Clear with no wheezes, rales, or rhonchi. HEART:  Regular rate rhythm, no murmurs, no rubs, no clicks Abd:  soft, positive bowel sounds, no organomegally, no rebound, no guarding Ext:  2 plus pulses, no edema, no cyanosis, no clubbing Skin:  No rashes no nodules Neuro:  CN II through XII intact, motor grossly intact  DEVICE  Normal device function.  See PaceArt for details.   Assess/Plan:  1. Sinus node dysfunction - her PPM has reached ERI and she will undergo gen change.  2. 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 We have reprogrammed hysteresis on to minimize ventricular pacing 3. HTN - her blood pressure is elevated. If it remains so, she will need additional medical therapy. 4. CAD - she denies anginal symptoms. Will hold plavix for 2 days before her PPM insertion.  [name],M.D.

## 2016-04-02 LAB — CUP PACEART REMOTE DEVICE CHECK
Battery Impedance: 6374 Ohm
Battery Remaining Longevity: -1
Battery Voltage: 2.59 V
Brady Statistic AP VP Percent: 0 %
Brady Statistic AP VS Percent: 99 %
Brady Statistic AS VP Percent: 0 %
Brady Statistic AS VS Percent: 1 %
Date Time Interrogation Session: 20170424120920
Implantable Lead Implant Date: 20070910
Implantable Lead Implant Date: 20070910
Implantable Lead Location: 753859
Implantable Lead Location: 753860
Implantable Lead Model: 5076
Implantable Lead Model: 5076
Lead Channel Impedance Value: 545 Ohm
Lead Channel Impedance Value: 623 Ohm
Lead Channel Pacing Threshold Amplitude: 0.625 V
Lead Channel Pacing Threshold Amplitude: 0.75 V
Lead Channel Pacing Threshold Pulse Width: 0.4 ms
Lead Channel Pacing Threshold Pulse Width: 0.4 ms
Lead Channel Sensing Intrinsic Amplitude: 16 mV
Lead Channel Setting Pacing Amplitude: 2 V
Lead Channel Setting Pacing Amplitude: 2.5 V
Lead Channel Setting Pacing Pulse Width: 0.4 ms
Lead Channel Setting Sensing Sensitivity: 5.6 mV

## 2016-04-09 ENCOUNTER — Ambulatory Visit (HOSPITAL_COMMUNITY)
Admission: RE | Admit: 2016-04-09 | Discharge: 2016-04-09 | Disposition: A | Discharge status: Home / Self Care | Payer: Medicare Other | Source: Ambulatory Visit | Attending: Internal Medicine | Admitting: Internal Medicine

## 2016-04-09 ENCOUNTER — Encounter (HOSPITAL_COMMUNITY): Payer: Self-pay | Admitting: Internal Medicine

## 2016-04-09 ENCOUNTER — Encounter (HOSPITAL_COMMUNITY)
Admission: RE | Disposition: A | Discharge status: Home / Self Care | Payer: Self-pay | Source: Ambulatory Visit | Attending: Internal Medicine

## 2016-04-09 DIAGNOSIS — K219 Gastro-esophageal reflux disease without esophagitis: Secondary | ICD-10-CM | POA: Diagnosis not present

## 2016-04-09 DIAGNOSIS — Z8249 Family history of ischemic heart disease and other diseases of the circulatory system: Secondary | ICD-10-CM | POA: Insufficient documentation

## 2016-04-09 DIAGNOSIS — I10 Essential (primary) hypertension: Secondary | ICD-10-CM | POA: Insufficient documentation

## 2016-04-09 DIAGNOSIS — M797 Fibromyalgia: Secondary | ICD-10-CM | POA: Insufficient documentation

## 2016-04-09 DIAGNOSIS — E1136 Type 2 diabetes mellitus with diabetic cataract: Secondary | ICD-10-CM | POA: Insufficient documentation

## 2016-04-09 DIAGNOSIS — I495 Sick sinus syndrome: Secondary | ICD-10-CM | POA: Diagnosis not present

## 2016-04-09 DIAGNOSIS — E785 Hyperlipidemia, unspecified: Secondary | ICD-10-CM | POA: Diagnosis not present

## 2016-04-09 DIAGNOSIS — I2510 Atherosclerotic heart disease of native coronary artery without angina pectoris: Secondary | ICD-10-CM | POA: Diagnosis not present

## 2016-04-09 DIAGNOSIS — M1990 Unspecified osteoarthritis, unspecified site: Secondary | ICD-10-CM | POA: Diagnosis not present

## 2016-04-09 DIAGNOSIS — Z45010 Encounter for checking and testing of cardiac pacemaker pulse generator [battery]: Secondary | ICD-10-CM | POA: Insufficient documentation

## 2016-04-09 DIAGNOSIS — Z833 Family history of diabetes mellitus: Secondary | ICD-10-CM | POA: Diagnosis not present

## 2016-04-09 DIAGNOSIS — I252 Old myocardial infarction: Secondary | ICD-10-CM | POA: Insufficient documentation

## 2016-04-09 DIAGNOSIS — Z7902 Long term (current) use of antithrombotics/antiplatelets: Secondary | ICD-10-CM | POA: Insufficient documentation

## 2016-04-09 HISTORY — DX: Sick sinus syndrome: I49.5

## 2016-04-09 HISTORY — PX: EP IMPLANTABLE DEVICE: SHX172B

## 2016-04-09 LAB — SURGICAL PCR SCREEN
MRSA, PCR: NEGATIVE
Staphylococcus aureus: POSITIVE — AB

## 2016-04-09 LAB — GLUCOSE, CAPILLARY
Glucose-Capillary: 97 mg/dL (ref 65–99)
Glucose-Capillary: 115 mg/dL — ABNORMAL HIGH (ref 65–99)

## 2016-04-09 SURGERY — PPM/BIV PPM GENERATOR CHANGEOUT

## 2016-04-09 MED ORDER — SODIUM CHLORIDE 0.9 % IR SOLN
80.0000 mg | Status: AC
  Administered 2016-04-09: 80 mg

## 2016-04-09 MED ORDER — CHLORHEXIDINE GLUCONATE 4 % EX LIQD: 60.0000 mL | Freq: Once | CUTANEOUS | Status: DC

## 2016-04-09 MED ORDER — SODIUM CHLORIDE 0.9 % IV SOLN: INTRAVENOUS | Status: DC

## 2016-04-09 MED ORDER — FENTANYL CITRATE (PF) 100 MCG/2ML IJ SOLN
INTRAMUSCULAR | Status: DC | PRN
  Administered 2016-04-09: 12.5 ug via INTRAVENOUS
  Administered 2016-04-09 (×2): 25 ug via INTRAVENOUS

## 2016-04-09 MED ORDER — SODIUM CHLORIDE 0.9 % IV SOLN
INTRAVENOUS | Status: DC
  Administered 2016-04-09: 07:00:00 via INTRAVENOUS

## 2016-04-09 MED ORDER — LIDOCAINE HCL (PF) 1 % IJ SOLN
INTRAMUSCULAR | Status: AC
  Filled 2016-04-09: qty 60

## 2016-04-09 MED ORDER — CEFAZOLIN SODIUM-DEXTROSE 2-4 GM/100ML-% IV SOLN
2.0000 g | INTRAVENOUS | Status: AC
  Administered 2016-04-09: 2 g via INTRAVENOUS

## 2016-04-09 MED ORDER — ACETAMINOPHEN 325 MG PO TABS: 325.0000 mg | ORAL_TABLET | ORAL | Status: DC | PRN

## 2016-04-09 MED ORDER — MUPIROCIN 2 % EX OINT
TOPICAL_OINTMENT | CUTANEOUS | Status: AC
  Filled 2016-04-09: qty 22

## 2016-04-09 MED ORDER — SODIUM CHLORIDE 0.9 % IR SOLN
Status: AC
  Filled 2016-04-09: qty 2

## 2016-04-09 MED ORDER — CEFAZOLIN SODIUM-DEXTROSE 2-4 GM/100ML-% IV SOLN
INTRAVENOUS | Status: AC
  Filled 2016-04-09: qty 100

## 2016-04-09 MED ORDER — MUPIROCIN 2 % EX OINT
TOPICAL_OINTMENT | Freq: Two times a day (BID) | CUTANEOUS | Status: DC
  Administered 2016-04-09: 07:00:00 via NASAL
  Filled 2016-04-09: qty 22

## 2016-04-09 MED ORDER — FENTANYL CITRATE (PF) 100 MCG/2ML IJ SOLN
INTRAMUSCULAR | Status: AC
  Filled 2016-04-09: qty 2

## 2016-04-09 MED ORDER — MIDAZOLAM HCL 5 MG/5ML IJ SOLN
INTRAMUSCULAR | Status: AC
  Filled 2016-04-09: qty 5

## 2016-04-09 MED ORDER — ONDANSETRON HCL 4 MG/2ML IJ SOLN: 4.0000 mg | Freq: Four times a day (QID) | INTRAMUSCULAR | Status: DC | PRN

## 2016-04-09 MED ORDER — HEPARIN (PORCINE) IN NACL 2-0.9 UNIT/ML-% IJ SOLN
INTRAMUSCULAR | Status: AC
  Filled 2016-04-09: qty 1000

## 2016-04-09 MED ORDER — LIDOCAINE HCL (PF) 1 % IJ SOLN
INTRAMUSCULAR | Status: DC | PRN
  Administered 2016-04-09: 40 mL via INTRADERMAL

## 2016-04-09 MED ORDER — MIDAZOLAM HCL 5 MG/5ML IJ SOLN
INTRAMUSCULAR | Status: DC | PRN
  Administered 2016-04-09 (×2): 1 mg via INTRAVENOUS
  Administered 2016-04-09: 2 mg via INTRAVENOUS
  Administered 2016-04-09: 1 mg via INTRAVENOUS

## 2016-04-09 SURGICAL SUPPLY — 4 items
CABLE SURGICAL S-101-97-12 (CABLE) ×1 IMPLANT
PAD DEFIB LIFELINK (PAD) ×1 IMPLANT
PPM ADVISA MRI DR A2DR01 (Pacemaker) ×2 IMPLANT
TRAY PACEMAKER INSERTION (PACKS) ×2 IMPLANT

## 2016-04-09 NOTE — Discharge Instructions (Signed)

## 2016-04-09 NOTE — Interval H&P Note (Signed)
History and Physical Interval Note:  04/09/2016 7:23 AM  Danley DankerMary Frances Lethco-Moore  has presented today for surgery, with the diagnosis of eri  The various methods of treatment have been discussed with the patient and family. After consideration of risks, benefits and other options for treatment, the patient has consented to  Procedure(s): PPM Generator Changeout (N/A) as a surgical intervention .  The patient's history has been reviewed, patient examined, no change in status, stable for surgery.  I have reviewed the patient's chart and labs.  Questions were answered to the patient's satisfaction.     Lewayne BuntingGregg Geordie Nooney

## 2016-04-09 NOTE — H&P (View-Only) (Signed)
HPI [name] returns today for followup. She is a very pleasant 75-year-old woman with hypertension and sinus node dysfunction, status post permanent pacemaker insertion. She has felt poorly over the past few weeks, corresponding to her PPM reaching ERI. She has had sob and chest pressure. She is pacing VVI 65. Allergies  Allergen Reactions  . Other     Pt has a recent bout of Seratonin-Syndrome so patient is unable to take certain medications anymore. Cymbalta, Paxil, Trazodone  . Caffeine     Nervousness   . Codeine Other (See Comments)    hallucinations  . Metformin And Related     Causes dehydration and causes me to go to the hospital     Current Outpatient Prescriptions  Medication Sig Dispense Refill  . amLODipine (NORVASC) 2.5 MG tablet Take 2.5 mg by mouth daily.    . atorvastatin (LIPITOR) 40 MG tablet Take 40 mg by mouth daily.    . buPROPion (WELLBUTRIN XL) 150 MG 24 hr tablet take two tablets by mouth in the morning  3  . clonazePAM (KLONOPIN) 0.5 MG tablet Take 0.5 mg by mouth as directed.    . clopidogrel (PLAVIX) 75 MG tablet Take 1 tablet (75 mg total) by mouth daily. 30 tablet 11  . FLUZONE HIGH-DOSE 0.5 ML SUSY ADM 0.5ML IM UTD  0  . gabapentin (NEURONTIN) 100 MG capsule TAKE 1 CAPSULE BY MOUTH THREE TIMES DAILY 270 capsule 0  . glimepiride (AMARYL) 2 MG tablet Take 1 tablet by mouth 3 (three) times daily with meals. And may take a half a tablet = 1mg a fourth time if blood sugar is still high.  3  . glucose blood (ONE TOUCH ULTRA TEST) test strip AS DIRECTED TWICE DAILY    . hydrochlorothiazide (HYDRODIURIL) 25 MG tablet Take 25 mg by mouth daily.    . losartan (COZAAR) 100 MG tablet Take 1 tablet (100 mg total) by mouth daily. 90 tablet 3  . ONE TOUCH ULTRA TEST test strip   2  . pantoprazole (PROTONIX) 40 MG tablet Take 1 tablet (40 mg total) by mouth 2 (two) times daily before a meal. 60 tablet 2   No current facility-administered medications for this  visit.     Past Medical History  Diagnosis Date  . DM (diabetes mellitus) (HCC)   . Diverticulosis   . Internal and external hemorrhoids without complication   . Arthritis   . Hypertension   . Hyperlipidemia   . Cardiac arrhythmia     now has pacemaker  . Potassium deficiency   . Fibromyalgia   . Allergy   . Cataract   . GERD (gastroesophageal reflux disease)   . Myocardial infarction (HCC)     ROS:   All systems reviewed and negative except as noted in the HPI.   Past Surgical History  Procedure Laterality Date  . Knee surgery      right x 2  . Neck surgery      x2  . Abdominal hysterectomy    . Colonoscopy  multiple  . Pacemaker insertion    . Appendectomy    . Upper gastrointestinal endoscopy    . Bil carple tunnel surgery    . Cardiac catheterization N/A 04/09/2015    Procedure: Left Heart Cath and Coronary Angiography;  Surgeon: Peter M Jordan, MD;  Location: [facility] INVASIVE CV LAB;  Service: Cardiovascular;  Laterality: N/A;     Family History  Problem Relation Age of Onset  . Coronary artery   disease Father   . Prostate cancer Father   . Colon cancer Father   . Lung cancer Father   . Diabetes Father   . Hypertension Neg Hx   . Esophageal cancer Neg Hx   . Rectal cancer Neg Hx   . Stomach cancer Neg Hx   . Diabetes Daughter   . Dementia Daughter   . Breast cancer Maternal Aunt   . Colon cancer Sister     x 2  . Colon cancer Brother   . Lung cancer Mother   . Dementia Mother   . Colon polyps Sister   . Crohn's disease Sister   . Kidney disease Daughter   . Diabetes Cousin     multiple  . Diabetes Maternal Aunt     multiple  . Diabetes Maternal Uncle     multiple  . Irritable bowel syndrome Sister   . Heart attack Daughter   . Heart attack Father   . Heart attack Brother      Social History   Social History  . Marital Status: Married    Spouse Name: N/A  . Number of Children: 2  . Years of Education: 12   Occupational History  .  Retired    Social History Main Topics  . Smoking status: Never Smoker   . Smokeless tobacco: Never Used  . Alcohol Use: No  . Drug Use: No  . Sexual Activity: Not on file   Other Topics Concern  . Not on file   Social History Narrative   Patient is married with 2 children.   Patient is right handed.   Patient has 12 th grade education.   Patient does not drink caffeine.     BP 142/86 mmHg  Pulse 66  Ht 5\' 6"  (1.676 m)  Wt 171 lb 1.9 oz (77.62 kg)  BMI 27.63 kg/m2  Physical Exam:  Well appearing 75 year old woman, NAD HEENT: Unremarkable Neck:  6 cm JVD, no thyromegally Lungs:  Clear with no wheezes, rales, or rhonchi. HEART:  Regular rate rhythm, no murmurs, no rubs, no clicks Abd:  soft, positive bowel sounds, no organomegally, no rebound, no guarding Ext:  2 plus pulses, no edema, no cyanosis, no clubbing Skin:  No rashes no nodules Neuro:  CN II through XII intact, motor grossly intact  DEVICE  Normal device function.  See PaceArt for details.   Assess/Plan:  1. Sinus node dysfunction - her PPM has reached ERI and she will undergo gen change.  2. 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 We have reprogrammed hysteresis on to minimize ventricular pacing 3. HTN - her blood pressure is elevated. If it remains so, she will need additional medical therapy. 4. CAD - she denies anginal symptoms. Will hold plavix for 2 days before her PPM insertion.  Leonia ReevesGregg Athalie Newhard,M.D.

## 2016-04-10 MED FILL — Gentamicin Sulfate Inj 40 MG/ML: INTRAMUSCULAR | Qty: 2 | Status: AC

## 2016-04-10 MED FILL — Sodium Chloride Irrigation Soln 0.9%: Qty: 500 | Status: AC

## 2016-04-10 MED FILL — Heparin Sodium (Porcine) 2 Unit/ML in Sodium Chloride 0.9%: INTRAMUSCULAR | Qty: 1000 | Status: AC

## 2016-04-10 MED FILL — Cefazolin Sodium-Dextrose IV Solution 2 GM/100ML-4%: INTRAVENOUS | Qty: 100 | Status: AC

## 2016-04-20 ENCOUNTER — Ambulatory Visit (INDEPENDENT_AMBULATORY_CARE_PROVIDER_SITE_OTHER): Payer: Medicare Other | Admitting: *Deleted

## 2016-04-20 ENCOUNTER — Encounter: Payer: Self-pay | Admitting: Internal Medicine

## 2016-04-20 DIAGNOSIS — Z950 Presence of cardiac pacemaker: Secondary | ICD-10-CM | POA: Diagnosis not present

## 2016-04-20 NOTE — Progress Notes (Signed)
Wound check appointment. Steri-strips removed by patient. Wound without redness or edema. Incision edges approximated, wound well healed. Normal device function. Thresholds, sensing, impedances consistent with previous measurements. Device programmed to maximize longevity. No mode switch or high ventricular rates noted. Atrial output adjusted to clinic standards of 2V. Histogram distribution appropriate for patient activity level. Device programmed to optimize intrinsic conduction. Battery voltage 3.13V. Patient enrolled in remote checks. ROV with GT 07/2016

## 2016-04-23 ENCOUNTER — Telehealth: Payer: Self-pay | Admitting: Internal Medicine

## 2016-04-23 NOTE — Telephone Encounter (Signed)
Pt calling re wanting to do exercises after Cath and has questions-pls call

## 2016-04-23 NOTE — Telephone Encounter (Signed)
Returning call to [name]. She had a generator replacement, no intervention with her atrial and ventricular leads. I advised her that she does not have a weight-bearing restriction and that she could participate in gym activities that were comfortable to her after her procedure. She verbalizes understanding and has no further questions.

## 2016-05-10 ENCOUNTER — Encounter (HOSPITAL_COMMUNITY): Payer: Self-pay | Admitting: *Deleted

## 2016-05-10 ENCOUNTER — Observation Stay (HOSPITAL_COMMUNITY): Payer: Medicare Other

## 2016-05-10 ENCOUNTER — Other Ambulatory Visit (HOSPITAL_COMMUNITY): Payer: Medicare Other

## 2016-05-10 ENCOUNTER — Observation Stay (HOSPITAL_COMMUNITY)
Admission: EM | Admit: 2016-05-10 | Discharge: 2016-05-11 | Disposition: A | Discharge status: Home / Self Care | Payer: Medicare Other | Attending: Internal Medicine | Admitting: Internal Medicine

## 2016-05-10 DIAGNOSIS — K219 Gastro-esophageal reflux disease without esophagitis: Secondary | ICD-10-CM | POA: Insufficient documentation

## 2016-05-10 DIAGNOSIS — F319 Bipolar disorder, unspecified: Secondary | ICD-10-CM | POA: Diagnosis not present

## 2016-05-10 DIAGNOSIS — F40240 Claustrophobia: Secondary | ICD-10-CM | POA: Diagnosis not present

## 2016-05-10 DIAGNOSIS — I639 Cerebral infarction, unspecified: Secondary | ICD-10-CM

## 2016-05-10 DIAGNOSIS — F419 Anxiety disorder, unspecified: Secondary | ICD-10-CM | POA: Insufficient documentation

## 2016-05-10 DIAGNOSIS — G43109 Migraine with aura, not intractable, without status migrainosus: Secondary | ICD-10-CM | POA: Diagnosis not present

## 2016-05-10 DIAGNOSIS — M1990 Unspecified osteoarthritis, unspecified site: Secondary | ICD-10-CM | POA: Insufficient documentation

## 2016-05-10 DIAGNOSIS — H538 Other visual disturbances: Secondary | ICD-10-CM | POA: Diagnosis not present

## 2016-05-10 DIAGNOSIS — I672 Cerebral atherosclerosis: Secondary | ICD-10-CM | POA: Insufficient documentation

## 2016-05-10 DIAGNOSIS — F411 Generalized anxiety disorder: Secondary | ICD-10-CM | POA: Diagnosis not present

## 2016-05-10 DIAGNOSIS — R413 Other amnesia: Secondary | ICD-10-CM | POA: Diagnosis not present

## 2016-05-10 DIAGNOSIS — E119 Type 2 diabetes mellitus without complications: Secondary | ICD-10-CM | POA: Diagnosis not present

## 2016-05-10 DIAGNOSIS — R51 Headache: Secondary | ICD-10-CM | POA: Diagnosis present

## 2016-05-10 DIAGNOSIS — E118 Type 2 diabetes mellitus with unspecified complications: Secondary | ICD-10-CM | POA: Diagnosis not present

## 2016-05-10 DIAGNOSIS — M797 Fibromyalgia: Secondary | ICD-10-CM | POA: Insufficient documentation

## 2016-05-10 DIAGNOSIS — R262 Difficulty in walking, not elsewhere classified: Secondary | ICD-10-CM | POA: Diagnosis not present

## 2016-05-10 DIAGNOSIS — Z79899 Other long term (current) drug therapy: Secondary | ICD-10-CM | POA: Diagnosis not present

## 2016-05-10 DIAGNOSIS — I10 Essential (primary) hypertension: Secondary | ICD-10-CM | POA: Diagnosis present

## 2016-05-10 DIAGNOSIS — R42 Dizziness and giddiness: Secondary | ICD-10-CM

## 2016-05-10 DIAGNOSIS — I252 Old myocardial infarction: Secondary | ICD-10-CM | POA: Diagnosis not present

## 2016-05-10 DIAGNOSIS — Z8673 Personal history of transient ischemic attack (TIA), and cerebral infarction without residual deficits: Secondary | ICD-10-CM | POA: Insufficient documentation

## 2016-05-10 DIAGNOSIS — G2579 Other drug induced movement disorders: Secondary | ICD-10-CM | POA: Diagnosis present

## 2016-05-10 DIAGNOSIS — Z7902 Long term (current) use of antithrombotics/antiplatelets: Secondary | ICD-10-CM | POA: Diagnosis not present

## 2016-05-10 DIAGNOSIS — Z950 Presence of cardiac pacemaker: Secondary | ICD-10-CM | POA: Diagnosis not present

## 2016-05-10 DIAGNOSIS — G451 Carotid artery syndrome (hemispheric): Secondary | ICD-10-CM

## 2016-05-10 DIAGNOSIS — R740 Nonspecific elevation of levels of transaminase and lactic acid dehydrogenase [LDH]: Secondary | ICD-10-CM

## 2016-05-10 DIAGNOSIS — R269 Unspecified abnormalities of gait and mobility: Secondary | ICD-10-CM | POA: Diagnosis not present

## 2016-05-10 DIAGNOSIS — H539 Unspecified visual disturbance: Secondary | ICD-10-CM

## 2016-05-10 DIAGNOSIS — G459 Transient cerebral ischemic attack, unspecified: Secondary | ICD-10-CM

## 2016-05-10 DIAGNOSIS — R202 Paresthesia of skin: Secondary | ICD-10-CM

## 2016-05-10 DIAGNOSIS — I495 Sick sinus syndrome: Secondary | ICD-10-CM | POA: Insufficient documentation

## 2016-05-10 DIAGNOSIS — Z7982 Long term (current) use of aspirin: Secondary | ICD-10-CM | POA: Insufficient documentation

## 2016-05-10 DIAGNOSIS — R519 Headache, unspecified: Secondary | ICD-10-CM | POA: Diagnosis present

## 2016-05-10 DIAGNOSIS — E1142 Type 2 diabetes mellitus with diabetic polyneuropathy: Secondary | ICD-10-CM | POA: Insufficient documentation

## 2016-05-10 DIAGNOSIS — E785 Hyperlipidemia, unspecified: Secondary | ICD-10-CM | POA: Diagnosis not present

## 2016-05-10 DIAGNOSIS — R7401 Elevation of levels of liver transaminase levels: Secondary | ICD-10-CM | POA: Diagnosis present

## 2016-05-10 LAB — URINALYSIS, ROUTINE W REFLEX MICROSCOPIC
Bilirubin Urine: NEGATIVE
Glucose, UA: NEGATIVE mg/dL
Hgb urine dipstick: NEGATIVE
Ketones, ur: NEGATIVE mg/dL
Nitrite: NEGATIVE
Protein, ur: NEGATIVE mg/dL
Specific Gravity, Urine: 1.008 (ref 1.005–1.030)
pH: 7.5 (ref 5.0–8.0)

## 2016-05-10 LAB — COMPREHENSIVE METABOLIC PANEL
ALT: 40 U/L (ref 14–54)
AST: 64 U/L — ABNORMAL HIGH (ref 15–41)
Albumin: 4.1 g/dL (ref 3.5–5.0)
Alkaline Phosphatase: 66 U/L (ref 38–126)
Anion gap: 8 (ref 5–15)
BUN: 14 mg/dL (ref 6–20)
CO2: 27 mmol/L (ref 22–32)
Calcium: 9.4 mg/dL (ref 8.9–10.3)
Chloride: 104 mmol/L (ref 101–111)
Creatinine, Ser: 0.89 mg/dL (ref 0.44–1.00)
GFR calc Af Amer: >60 mL/min (ref >60)
GFR calc non Af Amer: >60 mL/min (ref >60)
Glucose, Bld: 99 mg/dL (ref 65–99)
Potassium: 4 mmol/L (ref 3.5–5.1)
Sodium: 139 mmol/L (ref 135–145)
Total Bilirubin: 1.1 mg/dL (ref 0.3–1.2)
Total Protein: 6.7 g/dL (ref 6.5–8.1)

## 2016-05-10 LAB — SEDIMENTATION RATE: Sed Rate: 11 mm/hr (ref 0–22)

## 2016-05-10 LAB — GLUCOSE, CAPILLARY
Glucose-Capillary: 54 mg/dL — ABNORMAL LOW (ref 65–99)
Glucose-Capillary: 79 mg/dL (ref 65–99)
Glucose-Capillary: 131 mg/dL — ABNORMAL HIGH (ref 65–99)

## 2016-05-10 LAB — CBC WITH DIFFERENTIAL/PLATELET
Basophils Absolute: 0 10*3/uL (ref 0.0–0.1)
Basophils Relative: 0 %
Eosinophils Absolute: 0.1 10*3/uL (ref 0.0–0.7)
Eosinophils Relative: 2 %
HCT: 44.3 % (ref 36.0–46.0)
Hemoglobin: 15 g/dL (ref 12.0–15.0)
Lymphocytes Relative: 34 %
Lymphs Abs: 1.8 10*3/uL (ref 0.7–4.0)
MCH: 30.5 pg (ref 26.0–34.0)
MCHC: 33.9 g/dL (ref 30.0–36.0)
MCV: 90 fL (ref 78.0–100.0)
Monocytes Absolute: 0.6 10*3/uL (ref 0.1–1.0)
Monocytes Relative: 12 %
Neutro Abs: 2.8 10*3/uL (ref 1.7–7.7)
Neutrophils Relative %: 52 %
Platelets: 239 10*3/uL (ref 150–400)
RBC: 4.92 MIL/uL (ref 3.87–5.11)
RDW: 13.2 % (ref 11.5–15.5)
WBC: 5.3 10*3/uL (ref 4.0–10.5)

## 2016-05-10 LAB — URINE MICROSCOPIC-ADD ON: RBC / HPF: NONE SEEN RBC/hpf (ref 0–5)

## 2016-05-10 LAB — C-REACTIVE PROTEIN: CRP: <0.5 mg/dL (ref <1.0)

## 2016-05-10 MED ORDER — HYDROCHLOROTHIAZIDE 25 MG PO TABS
25.0000 mg | ORAL_TABLET | Freq: Every day | ORAL | Status: DC
  Administered 2016-05-10 – 2016-05-11 (×2): 25 mg via ORAL
  Filled 2016-05-10 (×2): qty 1

## 2016-05-10 MED ORDER — ENOXAPARIN SODIUM 40 MG/0.4ML ~~LOC~~ SOLN
40.0000 mg | SUBCUTANEOUS | Status: DC
  Administered 2016-05-10 – 2016-05-11 (×2): 40 mg via SUBCUTANEOUS
  Filled 2016-05-10 (×2): qty 0.4

## 2016-05-10 MED ORDER — GLIMEPIRIDE 4 MG PO TABS
2.0000 mg | ORAL_TABLET | Freq: Three times a day (TID) | ORAL | Status: DC
  Administered 2016-05-11: 2 mg via ORAL
  Filled 2016-05-10: qty 1

## 2016-05-10 MED ORDER — TEMAZEPAM 15 MG PO CAPS
30.0000 mg | ORAL_CAPSULE | Freq: Every day | ORAL | Status: DC
  Administered 2016-05-10: 30 mg via ORAL
  Filled 2016-05-10: qty 2

## 2016-05-10 MED ORDER — CLOPIDOGREL BISULFATE 75 MG PO TABS
75.0000 mg | ORAL_TABLET | Freq: Every day | ORAL | Status: DC
  Administered 2016-05-10 – 2016-05-11 (×2): 75 mg via ORAL
  Filled 2016-05-10 (×2): qty 1

## 2016-05-10 MED ORDER — VITAMIN B-12 1000 MCG PO TABS
1000.0000 ug | ORAL_TABLET | Freq: Every day | ORAL | Status: DC
  Administered 2016-05-10 – 2016-05-11 (×2): 1000 ug via ORAL
  Filled 2016-05-10 (×2): qty 1

## 2016-05-10 MED ORDER — CLONAZEPAM 0.5 MG PO TABS
1.5000 mg | ORAL_TABLET | Freq: Every day | ORAL | Status: DC
  Administered 2016-05-10: 1.5 mg via ORAL
  Filled 2016-05-10: qty 3

## 2016-05-10 MED ORDER — ATORVASTATIN CALCIUM 40 MG PO TABS
40.0000 mg | ORAL_TABLET | Freq: Every day | ORAL | Status: DC
  Administered 2016-05-10 – 2016-05-11 (×2): 40 mg via ORAL
  Filled 2016-05-10 (×2): qty 1

## 2016-05-10 MED ORDER — BUPROPION HCL ER (XL) 150 MG PO TB24
300.0000 mg | ORAL_TABLET | Freq: Every day | ORAL | Status: DC
  Administered 2016-05-10 – 2016-05-11 (×2): 300 mg via ORAL
  Filled 2016-05-10 (×2): qty 2

## 2016-05-10 MED ORDER — INSULIN ASPART 100 UNIT/ML ~~LOC~~ SOLN
0.0000 [IU] | Freq: Three times a day (TID) | SUBCUTANEOUS | Status: DC
  Administered 2016-05-10: 2 [IU] via SUBCUTANEOUS
  Administered 2016-05-11 (×2): 1 [IU] via SUBCUTANEOUS

## 2016-05-10 MED ORDER — NORTRIPTYLINE HCL 25 MG PO CAPS: 25.0000 mg | ORAL_CAPSULE | Freq: Every day | ORAL | Status: DC

## 2016-05-10 MED ORDER — CLONAZEPAM 0.5 MG PO TABS: 0.5000 mg | ORAL_TABLET | Freq: Every day | ORAL | Status: DC

## 2016-05-10 MED ORDER — STROKE: EARLY STAGES OF RECOVERY BOOK
Freq: Once | Status: AC
  Administered 2016-05-10: 07:00:00

## 2016-05-10 MED ORDER — LORAZEPAM 2 MG/ML IJ SOLN
1.0000 mg | Freq: Once | INTRAMUSCULAR | Status: AC
Start: 2016-05-10 — End: 2016-05-10
  Administered 2016-05-10: 1 mg via INTRAVENOUS
  Filled 2016-05-10: qty 1

## 2016-05-10 MED ORDER — GABAPENTIN 100 MG PO CAPS
100.0000 mg | ORAL_CAPSULE | Freq: Three times a day (TID) | ORAL | Status: DC
  Administered 2016-05-10 – 2016-05-11 (×4): 100 mg via ORAL
  Filled 2016-05-10 (×4): qty 1

## 2016-05-10 MED ORDER — IOPAMIDOL (ISOVUE-370) INJECTION 76%
INTRAVENOUS | Status: AC
  Administered 2016-05-10: 50 mL
  Filled 2016-05-10: qty 50

## 2016-05-10 MED ORDER — NORTRIPTYLINE HCL 10 MG PO CAPS
10.0000 mg | ORAL_CAPSULE | Freq: Every day | ORAL | Status: DC
  Administered 2016-05-10: 10 mg via ORAL
  Filled 2016-05-10: qty 1

## 2016-05-10 MED ORDER — AMLODIPINE BESYLATE 2.5 MG PO TABS
2.5000 mg | ORAL_TABLET | Freq: Every day | ORAL | Status: DC
  Administered 2016-05-10 – 2016-05-11 (×2): 2.5 mg via ORAL
  Filled 2016-05-10 (×2): qty 1

## 2016-05-10 MED ORDER — LOSARTAN POTASSIUM 50 MG PO TABS
100.0000 mg | ORAL_TABLET | Freq: Every day | ORAL | Status: DC
  Administered 2016-05-10 – 2016-05-11 (×2): 100 mg via ORAL
  Filled 2016-05-10 (×2): qty 2

## 2016-05-10 MED ORDER — LORAZEPAM 1 MG PO TABS
1.0000 mg | ORAL_TABLET | Freq: Once | ORAL | Status: AC
  Administered 2016-05-10: 1 mg via ORAL
  Filled 2016-05-10: qty 1

## 2016-05-10 MED ORDER — ASPIRIN EC 325 MG PO TBEC
325.0000 mg | DELAYED_RELEASE_TABLET | Freq: Every day | ORAL | Status: DC
  Administered 2016-05-10: 325 mg via ORAL
  Filled 2016-05-10: qty 1

## 2016-05-10 MED ORDER — PANTOPRAZOLE SODIUM 40 MG PO TBEC
40.0000 mg | DELAYED_RELEASE_TABLET | Freq: Two times a day (BID) | ORAL | Status: DC
  Administered 2016-05-10 – 2016-05-11 (×3): 40 mg via ORAL
  Filled 2016-05-10 (×3): qty 1

## 2016-05-10 MED ORDER — INSULIN ASPART 100 UNIT/ML ~~LOC~~ SOLN: 0.0000 [IU] | Freq: Every day | SUBCUTANEOUS | Status: DC

## 2016-05-10 MED ORDER — OMEGA-3-ACID ETHYL ESTERS 1 G PO CAPS
1.0000 g | ORAL_CAPSULE | Freq: Two times a day (BID) | ORAL | Status: DC
  Administered 2016-05-10 – 2016-05-11 (×3): 1 g via ORAL
  Filled 2016-05-10 (×3): qty 1

## 2016-05-10 NOTE — Care Management Obs Status (Signed)
MEDICARE OBSERVATION STATUS NOTIFICATION   Patient Details  Name: [name] MRN: [id number] Date of Birth: Mar 04, 1941   Medicare Observation Status Notification Given:  Yes    Antony HasteBennett, Florence Antonelli Harris, RN 05/10/2016, 4:14 PM

## 2016-05-10 NOTE — ED Notes (Signed)
MD at bedside. 

## 2016-05-10 NOTE — Progress Notes (Signed)
MRI brain without contrast, CT angiogram of head and neck negative and without acute findings. CRP less than 5 ESR 11 so doubt has temporal arteritis. TIA a workup remains in process  Erin Hearing, ANP

## 2016-05-10 NOTE — Progress Notes (Signed)
STROKE TEAM PROGRESS NOTE   HISTORY OF PRESENT ILLNESS (per record) [name] is a 75 y.o. female with a history of headaches since an episode of serotonin syndrome a couple of years ago. She presents today with 2 weeks of headache as well as 5 episodes of transient vision changes. She states that she gets a large light in her vision field with zigzag lines coming off of it. This lasted for about 3 minutes and then goes away.  Today, also, she had transient right arm numbness which came on all at once and lasted for a few minutes and then went away. LKW: Unclear given 2 weeks of headache and intermittent vision changes tpa given?: no, mild symptoms unclear time of onset   SUBJECTIVE (INTERVAL HISTORY) Her husband is at the bedside.  Overall she feels her condition is stable. She stated that she has HA 2-3 times per week with throbbing left sided HA, 3-5/10, needs to go to dark room and lie down, lasting all day, no medication. Yesterday, her HA worse than normal, 7-9/10, associated with 5 episodes of scotoma and wavy lines, lasting 2-3 min each, and also right hand forearm numbness lasting 4-5 hours in total. Today she still has mild right sided HA.    OBJECTIVE Temp:  [98.1 F (36.7 C)-98.7 F (37.1 C)] 98.1 F (36.7 C) (07/09 0641) Pulse Rate:  [58-67] 60 (07/09 0641) Cardiac Rhythm:  [-]  Resp:  [11-19] 16 (07/09 0641) BP: (132-161)/(66-77) 140/66 mmHg (07/09 0641) SpO2:  [95 %-100 %] 98 % (07/09 0641) Weight:  [78.019 kg (172 lb)] 78.019 kg (172 lb) (07/09 0204)  CBC:  Recent Labs Lab 05/10/16 0342  WBC 5.3  NEUTROABS 2.8  HGB 15.0  HCT 44.3  MCV 90.0  PLT 239    Basic Metabolic Panel:  Recent Labs Lab 05/10/16 0342  NA 139  K 4.0  CL 104  CO2 27  GLUCOSE 99  BUN 14  CREATININE 0.89  CALCIUM 9.4    Lipid Panel:    Component Value Date/Time   CHOL 158 11/15/2015 1415   CHOL 198 03/14/2015 1025   TRIG 130 11/15/2015 1415   HDL 50 11/15/2015  1415   HDL 54 03/14/2015 1025   CHOLHDL 3.2 11/15/2015 1415   CHOLHDL 3.7 03/14/2015 1025   VLDL 31 03/14/2015 1025   LDLCALC 82 11/15/2015 1415   LDLCALC 113* 03/14/2015 1025   HgbA1c:  Lab Results  Component Value Date   HGBA1C 6.4* 11/15/2015   Urine Drug Screen: No results found for: LABOPIA, COCAINSCRNUR, LABBENZ, AMPHETMU, THCU, LABBARB    IMAGING I have personally reviewed the radiological images below and agree with the radiology interpretations.  Ct Head Wo Contrast 05/10/2016   Negative CT HEAD for age.   Ct Angio Head and neck W Or Wo Contrast  05/10/2016  IMPRESSION: 1. Stable since January and largely normal for age CTA Head and Neck. There is stable bilateral distal vertebral artery atherosclerosis and mild stenosis. Little to no carotid plaque and No carotid stenosis. Normal ophthalmic artery origins. 2. Continued stable CT appearance of the brain.  Ct Head Wo Contrast  05/10/2016  IMPRESSION: Negative CT HEAD for age.   Mr Brain Wo Contrast  05/10/2016  IMPRESSION: No acute intracranial abnormality and largely unremarkable for age noncontrast MRI appearance of the brain. Mild for age nonspecific white matter changes.   TTE - pending   PHYSICAL EXAM   Temp:  [98.1 F (36.7 C)-98.7 F (37.1 C)]  98.2 F (36.8 C) (07/09 0830) Pulse Rate:  [58-70] 60 (07/09 1352) Resp:  [11-19] 16 (07/09 1352) BP: (126-161)/(58-77) 126/70 mmHg (07/09 1352) SpO2:  [95 %-100 %] 97 % (07/09 1352) Weight:  [172 lb (78.019 kg)] 172 lb (78.019 kg) (07/09 0204)  General - Well nourished, well developed, in no apparent distress.  Ophthalmologic - Sharp disc margins OU.   Cardiovascular - Regular rate and rhythm.  Mental Status -  Level of arousal and orientation to time, place, and person were intact. Language including expression, naming, repetition, comprehension was assessed and found intact. Fund of Knowledge was assessed and was intact.  Cranial Nerves II - XII - II -  Visual field intact OU. III, IV, VI - Extraocular movements intact. V - Facial sensation intact bilaterally. VII - Facial movement intact bilaterally. VIII - Hearing & vestibular intact bilaterally. X - Palate elevates symmetrically. XI - Chin turning & shoulder shrug intact bilaterally. XII - Tongue protrusion intact.  Motor Strength - The patient's strength was normal in all extremities and pronator drift was absent.  Bulk was normal and fasciculations were absent.   Motor Tone - Muscle tone was assessed at the neck and appendages and was normal.  Reflexes - The patient's reflexes were 1+ in all extremities and she had no pathological reflexes.  Sensory - Light touch, temperature/pinprick, vibration and proprioception, and Romberg testing were assessed and were symmetrical.    Coordination - The patient had normal movements in the hands and feet with no ataxia or dysmetria.  Tremor was absent.  Gait and Station - The patient's transfers, posture, gait, station, and turns were observed as normal.   ASSESSMENT/PLAN Ms. [name] is a 75 y.o. female with history of diabetes mellitus, hypertension, hyperlipidemia, cardiac arrhythmias, permanent pacemaker (sinus node dysfunction), coronary artery disease with previous MI, and fibromyalgia presenting with headaches and transient visual changes.  She did not receive IV t-PA due to unclear symptoms with unknown time of onset.  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 No need for addition of ASA. Will d/c  ASA and continue plavix.  Due to frequent HA episode, will add nortriptyline for HA prophylaxis. Start nortriptyline 10mg  Qhs for 3 days and then 25mg  Qhs. Can titrate up to 50-75mg  Qhs if needed.   Patient counseled to be compliant with her antithrombotic medications  Ongoing aggressive stroke risk factor management  Therapy recommendations: pending  Disposition: Pending  Hypertension  Stable  BP goal normotensive  Hyperlipidemia  Home meds:  Lipitor 40 mg daily resumed in hospital  LDL pending, goal < 70  Continue statin at discharge  Diabetes  HgbA1c pending, goal < 7.0  Controlled  On SSI  On amryl as home meds  Other Stroke Risk Factors  Advanced age  Coronary artery disease  Other Active Problems  Permanent pacemaker - interrogation showed no afib - compatible with MRI    Hospital day # 1  Neurology will sign off. Please call with questions. [name]  [facility] months. Thanks for the consult.  Marvel Plan, MD PhD Stroke Neurology 05/10/2016 3:33 PM    To contact Stroke Continuity provider, please refer to [email or web address]. After hours, contact General Neurology

## 2016-05-10 NOTE — Consult Note (Signed)
Neurology Consultation Reason for Consult: Transient right sided numbness Referring Physician: Calla Kicks  CC: right sided numbness.   History is obtained from: Patient   HPI: [name] is a 75 y.o. female with a historu of headaches since an episode of serotonin syndrome a couple of years ago. She presents today with 2 weeks of headache as well as 5 episodes of transient vision changes. She states that she gets a large light in her vision field with zigzag lines coming off of it. This lasted for about 3 minutes and then goes away.   Today, also, she had transient right arm numbness which came on all once lasted for a few minutes and then one way.   LKW: Unclear given 2 weeks of headache and intermittent vision changes tpa given?: no, mild symptoms unclear time of onset    ROS: A 14 point ROS was performed and is negative except as noted in the HPI.   Past Medical History  Diagnosis Date  . DM (diabetes mellitus) ([place])   . Diverticulosis   . Internal and external hemorrhoids without complication   . Arthritis   . Hypertension   . Hyperlipidemia   . Cardiac arrhythmia     now has pacemaker  . Potassium deficiency   . Fibromyalgia   . Allergy   . Cataract   . GERD (gastroesophageal reflux disease)   . Myocardial infarction [facility])      Family History  Problem Relation Age of Onset  . Coronary artery disease Father   . Prostate cancer Father   . Colon cancer Father   . Lung cancer Father   . Diabetes Father   . Hypertension Neg Hx   . Esophageal cancer Neg Hx   . Rectal cancer Neg Hx   . Stomach cancer Neg Hx   . Diabetes Daughter   . Dementia Daughter   . Breast cancer Maternal Aunt   . Colon cancer Sister     x 2  . Colon cancer Brother   . Lung cancer Mother   . Dementia Mother   . Colon polyps Sister   . Crohn's disease Sister   . Kidney disease Daughter   . Diabetes Cousin     multiple  . Diabetes Maternal Aunt     multiple  . Diabetes  Maternal Uncle     multiple  . Irritable bowel syndrome Sister   . Heart attack Daughter   . Heart attack Father   . Heart attack Brother      Social History:  reports that she has never smoked. She has never used smokeless tobacco. She reports that she does not drink alcohol or use illicit drugs.   Exam: Current vital signs: BP 149/69 mmHg  Pulse 61  Temp(Src) 98.6 F (37 C) (Oral)  Resp 12  Ht '5\' 6"'$  (1.676 m)  Wt 78.019 kg (172 lb)  BMI 27.77 kg/m2  SpO2 100% Vital signs in last 24 hours: Temp:  [98.6 F (37 C)] 98.6 F (37 C) (07/09 0204) Pulse Rate:  [60-67] 61 (07/09 0345) Resp:  [12-19] 12 (07/09 0345) BP: (132-157)/(67-77) 149/69 mmHg (07/09 0345) SpO2:  [98 %-100 %] 100 % (07/09 0345) Weight:  [78.019 kg (172 lb)] 78.019 kg (172 lb) (07/09 0204)   Physical Exam  Constitutional: Appears well-developed and well-nourished.  Psych: Affect appropriate to situation Eyes: No scleral injection HENT: No OP obstrucion Head: Normocephalic.  Cardiovascular: Normal rate and regular rhythm.  Respiratory: Effort normal and breath  sounds normal to anterior ascultation GI: Soft.  No distension. There is no tenderness.  Skin: WDI  Neuro: Mental Status: Patient is awake, alert, oriented to person, place, month, year, and situation. Patient is able to give a clear and coherent history. No signs of aphasia or neglect Cranial Nerves: II: Visual Fields are full. Pupils are equal, round, and reactive to light.   III,IV, VI: EOMI without ptosis or diploplia.  V: Facial sensation is symmetric to temperature VII: Facial movement is symmetric.  VIII: hearing is intact to voice X: Uvula elevates symmetrically XI: Shoulder shrug is symmetric. XII: tongue is midline without atrophy or fasciculations.  Motor: Tone is normal. Bulk is normal. 5/5 strength was present in all four extremities.  Sensory: Sensation is symmetric to light touch and temperature in the arms and  legs. Cerebellar: FNF and HKS are intact bilaterally  I have reviewed labs in epic and the results pertinent to this consultation are: Normal creatinine  I have reviewed the images obtained: CT head-no acute findings  Impression: 75 year old female with transient visual changes and right-sided numbness in the setting of new persistent headache. Given visual changes with headache, she does need an ESR, CRP to evaluate for possible temporal arteritis though I doubt this is what is going on. Historically, it sounds most like migraine, but in a patient with previous stroke and transient numbness and no history of migraines prior to 2 years ago I think that this needs to be a diagnosis of exclusion. I would favor given especially the numbness, treating this as TIA this time.  Recommendations: 1. HgbA1c, fasting lipid panel 2. MRI of the brain without contrast 3. Frequent neuro checks 4. Echocardiogram 5. CT angiogram of the head and neck 6. Prophylactic therapy-Antiplatelet med: Aspirin - dose '325mg'$  PO or '300mg'$  PR 7. Risk factor modification 8. Telemetry monitoring 9. PT consult, OT consult, Speech consult 10. please page stroke NP  Or  PA  Or MD  M-F from 8am -4 pm starting 7/9 as this patient will be followed by the stroke team at this point.   You can look them up on [email or web address]   11. ESR, CRP    Roland Rack, MD [facility] [phone]  If 7pm- 7am, please page neurology on call as listed in [place].

## 2016-05-10 NOTE — Progress Notes (Signed)
rn alerted cbg low. Instructed tech to give pt orange juice, pt drinking orange juice and now eating meal. Will recheck cbg shortly.

## 2016-05-10 NOTE — ED Notes (Signed)
Patient ambulatory to restroom with steady gait.

## 2016-05-10 NOTE — Progress Notes (Signed)
pts IV blew in CT. rn down in MRI. metronic tech in MRI as well dealing with pacemaker settings. rn started another IV in right forearm and gave 1 mg ativan. Will scan med when back in pt room.

## 2016-05-10 NOTE — ED Notes (Signed)
The pt arrived by gems from home c/o a headache blurred vision tingling in her rt arm.  History of some type syndrome cbg 128  alert

## 2016-05-10 NOTE — Progress Notes (Signed)
Pt this morning did not get glimepride due to different tests and order showing up late. Pt did receive 2 units insulin, had eaten her bagel from breakfast. Pt came back from MRI and CBG checked which was 54, pt given orange juice and starting eating her lunch. CBG rechecked and was 79.  rn will page md about if pt should receive 12oclock dose of glimepride. Insulin being held.

## 2016-05-10 NOTE — H&P (Signed)
History and Physical    [name]:[id number] DOB: 1941-09-30 DOA: 05/10/2016   PCP: Brand Males, NP  Patient coming from/Resides with: Private residence/lives alone  Chief Complaint: Persistent headache, right arm numbness and visual disturbances  HPI: [name] is a 75 y.o. female with medical history significant for dyslipidemia, hypertension, diabetes on oral agents, diabetic peripheral neuropathy, history of serotonin syndrome with residual headaches and neuropathy, ? History of prior CVA, anxiety disorder, B12 deficiency, GERD and sinus node dysfunction status post pacemaker. She has previously undergone cardiac catheterization in 2004 she demonstrated no CAD. Patient reports history of chronic recurrent headaches since her serotonin syndrome but over the past 2 weeks she's had a more persistent headache that involves the top of her head and her left eye. In the past 24 hours the headache increased in intensity and was followed by 5 separate episodes of visual disturbance she describes as "everything going black with a white ball in the middle". The duration of each of these episodes was about 2-3 minutes. Separate from the visual disturbances but associated with the headache patient was experiencing numbness in the right upper extremity without weakness. She has chronic dizziness, gait disturbances and falls since her serotonin syndrome. She has had no constitutional symptoms. She reports she did not take anything for headache because she was informed by her primary care physician not to take over-the-counter medications because of "problems with her liver labs".  After arrival to the ER she was evaluated by neurology who did not feel her symptoms were consistent with a stroke, more consistent with either complex migraine or possible temporal arteritis. With history of apparent previous stroke transient numbness and no history of migraine prior to 2 years previous  TIA needs to be a diagnosis of exclusion and therefore admission for workup indicated.  ED Course:  Vital signs: PO temp 90.6-BP 157/67-pulse 60-respirations 15-from her saturations 100% CT head without contrast: Negative CT for head Lab data: Sodium 139, potassium 4.0, BUN 14, creatinine 0.9, LFTs normal except for mild elevation AST 64; white count 5300 with normal differential, hemoglobin 15, platelets 239,000; urinalysis rare bacteria moderate leukocytes 0-5 wbc's Medications and treatments: None  Review of Systems:  In addition to the HPI above,  No Fever-chills, myalgias or other constitutional symptoms No Headache, changes with hearing, new weakness-patient has chronic gait disturbance and dizziness since serotonin syndrome No problems swallowing food or Liquids, indigestion/reflux No Chest pain, Cough or Shortness of Breath, palpitations, orthopnea or DOE No Abdominal pain, N/V; no melena or hematochezia, no dark tarry stools No dysuria, hematuria or flank pain No new skin rashes, lesions, masses or bruises, No new joints pains-aches No recent weight gain or loss No polyuria, polydypsia or polyphagia,   Past Medical History  Diagnosis Date  . DM (diabetes mellitus) ([facility])   . Diverticulosis   . Internal and external hemorrhoids without complication   . Arthritis   . Hypertension   . Hyperlipidemia   . Cardiac arrhythmia     now has pacemaker  . Potassium deficiency   . Fibromyalgia   . Allergy   . Cataract   . GERD (gastroesophageal reflux disease)   . Myocardial infarction [facility])     Past Surgical History  Procedure Laterality Date  . Knee surgery      right x 2  . Neck surgery      x2  . Abdominal hysterectomy    . Colonoscopy  multiple  . Pacemaker insertion    .  Appendectomy    . Upper gastrointestinal endoscopy    . Bil carple tunnel surgery    . Cardiac catheterization N/A 04/09/2015    Procedure: Left Heart Cath and Coronary Angiography;  Surgeon: Peter  M SwazilandJordan, MD;  [facility];  Service: Cardiovascular;  Laterality: N/A;  . Ep implantable device N/A 04/09/2016    Procedure: PPM Generator Changeout;  Surgeon: Marinus MawGregg W Taylor, MD;  [facility];  Service: Cardiovascular;  Laterality: N/A;    Social History   Social History  . Marital Status: Married    Spouse Name: N/A  . Number of Children: 2  . Years of Education: 12   Occupational History  . Retired    Social History Main Topics  . Smoking status: Never Smoker   . Smokeless tobacco: Never Used  . Alcohol Use: No  . Drug Use: No  . Sexual Activity: Not on file   Other Topics Concern  . Not on file   Social History Narrative   Patient is married with 2 children.   Patient is right handed.   Patient has 12 th grade education.   Patient does not drink caffeine.    Mobility: Utilizes a cane Work history: Not obtained   Allergies  Allergen Reactions  . Other     Pt has a recent bout of Seratonin-Syndrome so patient is unable to take certain medications anymore. Cymbalta, Paxil, Trazodone  . Caffeine     Nervousness   . Codeine Other (See Comments)    hallucinations  . Metformin And Related     Causes dehydration and causes me to go to the hospital    Family History  Problem Relation Age of Onset  . Coronary artery disease Father   . Prostate cancer Father   . Colon cancer Father   . Lung cancer Father   . Diabetes Father   . Hypertension Neg Hx   . Esophageal cancer Neg Hx   . Rectal cancer Neg Hx   . Stomach cancer Neg Hx   . Diabetes Daughter   . Dementia Daughter   . Breast cancer Maternal Aunt   . Colon cancer Sister     x 2  . Colon cancer Brother   . Lung cancer Mother   . Dementia Mother, several siblings -daughter died of early onset dementia    . Colon polyps Sister   . Crohn's disease Sister   . Kidney disease Daughter   . Diabetes Cousin     multiple  . Diabetes Maternal Aunt     multiple  . Diabetes  Maternal Uncle     multiple  . Irritable bowel syndrome Sister   . Heart attack Daughter   . Heart attack Father   . Heart attack Brother      Prior to Admission medications   Medication Sig Start Date End Date Taking? Authorizing Provider  amLODipine (NORVASC) 2.5 MG tablet Take 2.5 mg by mouth daily.    Historical Provider, MD  atorvastatin (LIPITOR) 40 MG tablet Take 40 mg by mouth daily. 07/25/14   Historical Provider, MD  buPROPion (WELLBUTRIN XL) 300 MG 24 hr tablet Take 300 mg by mouth daily before breakfast. 03/28/16   Historical Provider, MD  clonazePAM (KLONOPIN) 0.5 MG tablet Take 0.5-1.5 mg by mouth 2 (two) times daily. Take 1 tablet (0.5 mg) by mouth daily after lunch, take 3 tablets (1.5 mg) daily at bedtime    Historical Provider, MD  clopidogrel (  PLAVIX) 75 MG tablet Take 1 tablet (75 mg total) by mouth daily. 11/15/15   Marvel Plan, MD  Coenzyme Q10 200 MG capsule Take 200 mg by mouth daily with lunch.    Historical Provider, MD  cyanocobalamin 500 MCG tablet Take 1,000 mcg by mouth daily with lunch. Vitamin B12    Historical Provider, MD  gabapentin (NEURONTIN) 100 MG capsule TAKE 1 CAPSULE BY MOUTH THREE TIMES DAILY 08/05/15   Marvel Plan, MD  glimepiride (AMARYL) 2 MG tablet Take 2 mg by mouth 3 (three) times daily with meals.  08/16/14   Historical Provider, MD  glucose blood (ONE TOUCH ULTRA TEST) test strip AS DIRECTED TWICE DAILY 07/09/15   Historical Provider, MD  hydrochlorothiazide (HYDRODIURIL) 25 MG tablet Take 25 mg by mouth daily.    Historical Provider, MD  losartan (COZAAR) 100 MG tablet Take 1 tablet (100 mg total) by mouth daily. 04/10/15   Peter M [place], MD  Multiple Vitamins-Minerals (ANTIOXIDANT PO) Take 1 tablet by mouth daily with lunch.    Historical Provider, MD  Omega-3 Fatty Acids (FISH OIL PO) Take 2 capsules by mouth daily with lunch.    Historical Provider, MD  OVER THE COUNTER MEDICATION Take 2 tablets by mouth daily with lunch. Circulation and Vein  Support by Feliberto Harts    Historical Provider, MD  pantoprazole (PROTONIX) 40 MG tablet Take 1 tablet (40 mg total) by mouth 2 (two) times daily before a meal. 09/05/14   Iva Boop, MD  temazepam (RESTORIL) 30 MG capsule Take 30 mg by mouth at bedtime. 03/16/16   Historical Provider, MD    Physical Exam: Filed Vitals:   05/10/16 0515 05/10/16 0530 05/10/16 0538 05/10/16 0641  BP: 140/73 154/70  140/66  Pulse: 60 59  60  Temp:   98.7 F (37.1 C) 98.1 F (36.7 C)  TempSrc:    Oral  Resp: 16 12  16   Height:      Weight:      SpO2: 100% 95%  98%      Constitutional: NAD, calm, comfortable Eyes: PERRL, lids and conjunctivae normal-Nontender over bilateral temples ENMT: Mucous membranes are moist. Posterior pharynx clear of any exudate or lesions.Normal dentition.  Neck: normal, supple, no masses, no thyromegaly Respiratory: clear to auscultation bilaterally, no wheezing, no crackles. Normal respiratory effort. No accessory muscle use.  Cardiovascular: Regular rate and rhythm, no murmurs / rubs / gallops. No extremity edema. 2+ pedal pulses. No carotid bruits.  Abdomen: no tenderness, no masses palpated. No hepatosplenomegaly. Bowel sounds positive.  Musculoskeletal: no clubbing / cyanosis. No joint deformity upper and lower extremities. Good ROM, no contractures. Normal muscle tone.  Skin: no rashes, lesions, ulcers. No induration Neurologic: CN 2-12 grossly intact-questionable horizontal nystagmus with patient subjectively reporting induction of nausea with testing. Sensation intact, DTR normal. Strength 5/5 x all 4 extremities.  Psychiatric: Normal judgment and insight. Alert and oriented x 3. Normal mood.    Labs on Admission: I have personally reviewed following labs and imaging studies  CBC:  Recent Labs Lab 05/10/16 0342  WBC 5.3  NEUTROABS 2.8  HGB 15.0  HCT 44.3  MCV 90.0  PLT 239   Basic Metabolic Panel:  Recent Labs Lab 05/10/16 0342  NA 139  K 4.0    CL 104  CO2 27  GLUCOSE 99  BUN 14  CREATININE 0.89  CALCIUM 9.4   GFR: Estimated Creatinine Clearance: 59.4 mL/min (by C-G formula based on Cr of 0.89). Liver Function  Tests:  Recent Labs Lab 05/10/16 0342  AST 64*  ALT 40  ALKPHOS 66  BILITOT 1.1  PROT 6.7  ALBUMIN 4.1   No results for input(s): LIPASE, AMYLASE in the last 168 hours. No results for input(s): AMMONIA in the last 168 hours. Coagulation Profile: No results for input(s): INR, PROTIME in the last 168 hours. Cardiac Enzymes: No results for input(s): CKTOTAL, CKMB, CKMBINDEX, TROPONINI in the last 168 hours. BNP (last 3 results) No results for input(s): PROBNP in the last 8760 hours. HbA1C: No results for input(s): HGBA1C in the last 72 hours. CBG: No results for input(s): GLUCAP in the last 168 hours. Lipid Profile: No results for input(s): CHOL, HDL, LDLCALC, TRIG, CHOLHDL, LDLDIRECT in the last 72 hours. Thyroid Function Tests: No results for input(s): TSH, T4TOTAL, FREET4, T3FREE, THYROIDAB in the last 72 hours. Anemia Panel: No results for input(s): VITAMINB12, FOLATE, FERRITIN, TIBC, IRON, RETICCTPCT in the last 72 hours. Urine analysis:    Component Value Date/Time   COLORURINE YELLOW 05/10/2016 0325   APPEARANCEUR CLOUDY* 05/10/2016 0325   LABSPEC 1.008 05/10/2016 0325   PHURINE 7.5 05/10/2016 0325   GLUCOSEU NEGATIVE 05/10/2016 0325   HGBUR NEGATIVE 05/10/2016 0325   BILIRUBINUR NEGATIVE 05/10/2016 0325   KETONESUR NEGATIVE 05/10/2016 0325   PROTEINUR NEGATIVE 05/10/2016 0325   UROBILINOGEN 1.0 09/10/2014 1702   NITRITE NEGATIVE 05/10/2016 0325   LEUKOCYTESUR MODERATE* 05/10/2016 0325   Sepsis Labs: @LABRCNTIP (procalcitonin:4,lacticidven:4) )No results found for this or any previous visit (from the past 240 hour(s)).   Radiological Exams on Admission: Ct Head Wo Contrast  05/10/2016  CLINICAL DATA:  Five episode of visual disturbance this week, LEFT-sided headache. History of stroke,  hypertension slight diabetes EXAM: CT HEAD WITHOUT CONTRAST TECHNIQUE: Contiguous axial images were obtained from the base of the skull through the vertex without intravenous contrast. COMPARISON:  CT HEAD November 22, 2015 FINDINGS: INTRACRANIAL CONTENTS: The ventricles and sulci are normal for age. No intraparenchymal hemorrhage, mass effect nor midline shift. Patchy supratentorial white matter hypodensities are less than expected for patient's age and though non-specific likely represent chronic small vessel ischemic disease. No acute large vascular territory infarcts. No abnormal extra-axial fluid collections. Basal cisterns are patent. Minimal calcific atherosclerosis of the carotid siphons. ORBITS: The included ocular globes and orbital contents are non-suspicious. Status post bilateral ocular lens implants. SINUSES: Mild paranasal sinusitis. Mastoid air cells are well aerated. SKULL/SOFT TISSUES: No skull fracture. No significant soft tissue swelling. Moderate bilateral temporomandibular osteoarthrosis. IMPRESSION: Negative CT HEAD for age. Electronically Signed   By: Awilda Metro M.D.   On: 05/10/2016 06:28    EKG: (Independently reviewed) Junctional rhythm without any visible pacer spikes, ventricular rate 60 bpm, QTC 472 ms, nonspecific ST changes in septal lateral leads  Assessment/Plan Principal Problem:   TIA (transient ischemic attack)-  Headache -Patient presents with headache, transient right upper extremity numbness and visual disturbances as described above concerning for possible TIA; other differential includes temporal arteritis or complex migraine -Appreciate neurological assistance and agree with workup as described in consultation note -ECHO -MRI brain, CTA head/neck (Ativan for reported claustrophobia) -PT/OT/SLP -Cont Plavix- add ASA until TIA excluded -Cont statin-ck lipid panel -Check HgbA1c -currently low grade HA w/o numbness or vision changes  Active Problems:    History of Serotonin syndrome w/ recurrent residual headaches -as above    Dizziness -chronic since serotonin syndrome -exam today w/ ?? of horizontal nystagmus with self report of nausea with testing -PT vestibular exam -check OVS  Essential hypertension -Continue Norvasc, hydrochlorothiazide and Cozaar    Type 2 diabetes mellitus with diabetic polyneuropathy  -Continue Amaryl -SSI -Continue Neurontin    Dyslipidemia -Continue Lipitor and omega-3 fatty acids    Elevated AST (SGOT) -Chronic and actually improved from previous -Likely from statin and other medications -Avoid or only very short-term use of Tylenol with serial labs    GERD with stricture -Continue Protonix    Anxiety state -Continue Wellbutrin, Restoril and Klonopin as prior to admission    Sinus node dysfunction s/p PPM -Status post generator change May 2017 -History of p.m. syndrome with subsequent reprogramming and made to minimize ventricular pacing      DVT prophylaxis: Lovenox Code Status: Full code  Family Communication: No family at bedside Disposition Plan: Anticipate discharge to preadmission home environment when medically stable Consults called: Neurology/ [place] Admission status: Observation/telemetry    ELLIS,ALLISON L. ANP-BC [facility] Pager [phone]   If 7PM-7AM, please contact night-coverage [email or web address] Password [email or web address]  05/10/2016, 8:22 AM

## 2016-05-10 NOTE — ED Notes (Signed)
Patient reports hx CVA and TIAs. States she had a sudden onset L sided headache with L "eye soreness" as well as R arm numbness. Denies numbness at this time, just reports headache and eye pain.

## 2016-05-10 NOTE — ED Notes (Signed)
Neurology at bedside.

## 2016-05-10 NOTE — ED Provider Notes (Signed)
CSN: [id number]     Arrival date & time 05/10/16  0159 History   By signing my name below, I, Suzan Slick. Elon Spanner, attest that this documentation has been prepared under the direction and in the presence of Arby Barrette, MD.  Electronically Signed: Suzan Slick. Elon Spanner, ED Scribe. 05/10/2016. 4:37 AM.   Chief Complaint  Patient presents with  . Headache   The history is provided by the patient. No language interpreter was used.    HPI Comments: [name], brought in by EMS is a 75 y.o. female with a PMHx of DM, HTN, hyperlipidemia, implanted pacemaker, and MI who presents to the Emergency Department complaining of constant, unchanged HA with associated L eye pain x 1 week. She also reports multiple episodes of visual disturbances throughout the week. She described visual field as a black With a white spot or light that occasionally moves throughout her field of vision. She states episodes typically lasted 2-3 minutes. She also reports intermittent numbness to the R arm. No aggravating or alleviating factors at this time. No OTC medications or home remedies attempted prior to arrival. No recent fever, chills, nausea, vomiting, or dysuria.   PCP: PROVIDER NOT IN SYSTEM    Past Medical History  Diagnosis Date  . DM (diabetes mellitus) (HCC)   . Diverticulosis   . Internal and external hemorrhoids without complication   . Arthritis   . Hypertension   . Hyperlipidemia   . Cardiac arrhythmia     now has pacemaker  . Potassium deficiency   . Fibromyalgia   . Allergy   . Cataract   . GERD (gastroesophageal reflux disease)   . Myocardial infarction [facility])    Past Surgical History  Procedure Laterality Date  . Knee surgery      right x 2  . Neck surgery      x2  . Abdominal hysterectomy    . Colonoscopy  multiple  . Pacemaker insertion    . Appendectomy    . Upper gastrointestinal endoscopy    . Bil carple tunnel surgery    . Cardiac catheterization N/A 04/09/2015     Procedure: Left Heart Cath and Coronary Angiography;  Surgeon: Peter M Swaziland, MD;  Location: [facility] INVASIVE CV LAB;  Service: Cardiovascular;  Laterality: N/A;  . Ep implantable device N/A 04/09/2016    Procedure: PPM Generator Changeout;  Surgeon: Marinus Maw, MD;  Location: [facility] INVASIVE CV LAB;  Service: Cardiovascular;  Laterality: N/A;   Family History  Problem Relation Age of Onset  . Coronary artery disease Father   . Prostate cancer Father   . Colon cancer Father   . Lung cancer Father   . Diabetes Father   . Hypertension Neg Hx   . Esophageal cancer Neg Hx   . Rectal cancer Neg Hx   . Stomach cancer Neg Hx   . Diabetes Daughter   . Dementia Daughter   . Breast cancer Maternal Aunt   . Colon cancer Sister     x 2  . Colon cancer Brother   . Lung cancer Mother   . Dementia Mother   . Colon polyps Sister   . Crohn's disease Sister   . Kidney disease Daughter   . Diabetes Cousin     multiple  . Diabetes Maternal Aunt     multiple  . Diabetes Maternal Uncle     multiple  . Irritable bowel syndrome Sister   . Heart attack Daughter   . Heart attack  Father   . Heart attack Brother    Social History  Substance Use Topics  . Smoking status: Never Smoker   . Smokeless tobacco: Never Used  . Alcohol Use: No   OB History    Gravida Para Term Preterm AB TAB SAB Ectopic Multiple Living   2 2 2       2      Review of Systems  A complete 10 system review of systems was obtained and all systems are negative except as noted in the HPI and PMH.    Allergies  Other; Caffeine; Codeine; and Metformin and related  Home Medications   Prior to Admission medications   Medication Sig Start Date End Date Taking? Authorizing Provider  amLODipine (NORVASC) 2.5 MG tablet Take 2.5 mg by mouth daily.    Historical Provider, MD  atorvastatin (LIPITOR) 40 MG tablet Take 40 mg by mouth daily. 07/25/14   Historical Provider, MD  buPROPion (WELLBUTRIN XL) 300 MG 24 hr tablet Take 300 mg by  mouth daily before breakfast. 03/28/16   Historical Provider, MD  clonazePAM (KLONOPIN) 0.5 MG tablet Take 0.5-1.5 mg by mouth 2 (two) times daily. Take 1 tablet (0.5 mg) by mouth daily after lunch, take 3 tablets (1.5 mg) daily at bedtime    Historical Provider, MD  clopidogrel (PLAVIX) 75 MG tablet Take 1 tablet (75 mg total) by mouth daily. 11/15/15   Marvel Plan, MD  Coenzyme Q10 200 MG capsule Take 200 mg by mouth daily with lunch.    Historical Provider, MD  cyanocobalamin 500 MCG tablet Take 1,000 mcg by mouth daily with lunch. Vitamin B12    Historical Provider, MD  gabapentin (NEURONTIN) 100 MG capsule TAKE 1 CAPSULE BY MOUTH THREE TIMES DAILY 08/05/15   Marvel Plan, MD  glimepiride (AMARYL) 2 MG tablet Take 2 mg by mouth 3 (three) times daily with meals.  08/16/14   Historical Provider, MD  glucose blood (ONE TOUCH ULTRA TEST) test strip AS DIRECTED TWICE DAILY 07/09/15   Historical Provider, MD  hydrochlorothiazide (HYDRODIURIL) 25 MG tablet Take 25 mg by mouth daily.    Historical Provider, MD  losartan (COZAAR) 100 MG tablet Take 1 tablet (100 mg total) by mouth daily. 04/10/15   Peter M Swaziland, MD  Multiple Vitamins-Minerals (ANTIOXIDANT PO) Take 1 tablet by mouth daily with lunch.    Historical Provider, MD  Omega-3 Fatty Acids (FISH OIL PO) Take 2 capsules by mouth daily with lunch.    Historical Provider, MD  OVER THE COUNTER MEDICATION Take 2 tablets by mouth daily with lunch. Circulation and Vein Support by Feliberto Harts    Historical Provider, MD  pantoprazole (PROTONIX) 40 MG tablet Take 1 tablet (40 mg total) by mouth 2 (two) times daily before a meal. 09/05/14   Iva Boop, MD  temazepam (RESTORIL) 30 MG capsule Take 30 mg by mouth at bedtime. 03/16/16   Historical Provider, MD   Triage Vitals: BP 149/69 mmHg  Pulse 61  Temp(Src) 98.6 F (37 C) (Oral)  Resp 12  Ht 5\' 6"  (1.676 m)  Wt 172 lb (78.019 kg)  BMI 27.77 kg/m2  SpO2 100%   Physical Exam  Constitutional: She is  oriented to person, place, and time. She appears well-developed and well-nourished.  HENT:  Head: Normocephalic.  Eyes: EOM are normal. Pupils are equal, round, and reactive to light.  Neck: Normal range of motion.  Cardiovascular: Normal rate, regular rhythm and normal heart sounds.  Exam reveals no gallop  and no friction rub.   No murmur heard. Pulmonary/Chest: Effort normal and breath sounds normal. She has no wheezes. She has no rales.  Abdominal: Soft. Bowel sounds are normal. She exhibits no distension. There is no tenderness. There is no rebound and no guarding.  Musculoskeletal: Normal range of motion.  1+ edema noted to BLE.  Neurological: She is alert and oriented to person, place, and time. No cranial nerve deficit. She exhibits normal muscle tone. Coordination normal.  Finger-nose examination intact bilaterally.  Skin: Skin is warm and dry.  Psychiatric: She has a normal mood and affect. Judgment normal.  Nursing note and vitals reviewed.   ED Course  Procedures (including critical care time)  DIAGNOSTIC STUDIES: Oxygen Saturation is 100% on RA, Normal by my interpretation.    COORDINATION OF CARE: 2:57 AM-Discussed treatment plan with pt at bedside and pt agreed to plan.     Labs Review Labs Reviewed  COMPREHENSIVE METABOLIC PANEL - Abnormal; Notable for the following:    AST 64 (*)    All other components within normal limits  URINALYSIS, ROUTINE W REFLEX MICROSCOPIC (NOT AT [facility]) - Abnormal; Notable for the following:    APPearance CLOUDY (*)    Leukocytes, UA MODERATE (*)    All other components within normal limits  URINE MICROSCOPIC-ADD ON - Abnormal; Notable for the following:    Squamous Epithelial / LPF 0-5 (*)    Bacteria, UA RARE (*)    All other components within normal limits  CBC WITH DIFFERENTIAL/PLATELET    Imaging Review No results found. I have personally reviewed and evaluated these images and lab results as part of my medical  decision-making.   EKG Interpretation   Date/Time:  Sunday May 10 2016 03:19:29 EDT Ventricular Rate:  60 PR Interval:    QRS Duration: 91 QT Interval:  472 QTC Calculation: 472 R Axis:   2 Text Interpretation:  Junctional rhythm Borderline repolarization  abnormality Confirmed by Donnald GarrePfeiffer, MD, Lebron ConnersMarcy [phone]([id number] on 05/10/2016 3:40:30  AM     Consult: Dr. Amada JupiterKirkpatrick has seen the patient in the emergency department. At this time consideration is given to TIA versus migrainous type headache. Recommends observation admission with follow-up MRI to be done under the supervision of [employer] representative for the patient's pacemaker. MDM   Final diagnoses:  Transient cerebral ischemia, unspecified transient cerebral ischemia type  Acute nonintractable headache, unspecified headache type  Visual disturbance   At this time, patient is having waxing and waning visual symptoms. She is not currently having visual loss. No current visual field deficit. The patient is also experiencing a sensation of numbness that is waxing and waning in her right arm. Currently patient's neurologic examination is intact and normal. Plan will be for observation admission with follow-up MRI. Patient does have an MRI compatible pacemaker but [employer] representative will have to be present. Dr. Amada JupiterKirkpatrick is concern for TIA based on patient's risk factor, history and history of present illness.   Arby BarretteMarcy Tupac Jeffus, MD 05/10/16 [phone]

## 2016-05-10 NOTE — Progress Notes (Signed)
This patient has received 120ml's of saline extravasation into right arm during an angio head/neck exam.  The exam was performed on 05/10/16  Site / affected area assessed by Dr. Margo AyeHall

## 2016-05-10 NOTE — ED Notes (Signed)
Headache for 3 days she was told ,by her doctor not to take anything for it

## 2016-05-10 NOTE — Progress Notes (Addendum)
Occupational Therapy Evaluation Patient Details Name: [name] MRN: [id number] DOB: 05/16/1923 Today's Date: 05/10/2016    History of Present Illness 75 y.o. female with PMH of DM, HTN, HLD, s/p PM, HA; who presented with left-sided headache, right arm numbness, and visual disturbance. Neurology consulted and recommended obtaining a MRI and completing stroke workup. They question migraine with aura versus TIA.   Clinical Impression   PTA, pt was independent with ADLs and uses [facility] for mobility. Pt currently presents with short-term memory deficits, generalized weakness, and balance deficits and required min assist for LB ADLs and basic transfers. Pt expressed concerns with returning home upon d/c and would really like to move to ILF without her husband. CM notified and SW consult ordered. Pt will benefit from continued acute OT to increase independence and safety with ADLs and mobility to allow for safe discharge home. Recommend HHOT and 3in1 for home use.   Follow Up Recommendations  Home health OT;Supervision/Assistance - 24 hour    Equipment Recommendations  3 in 1 bedside comode    Recommendations for Other Services Other (comment) (SW consult)     Precautions / Restrictions Precautions Precautions: Fall Restrictions Weight Bearing Restrictions: No      Mobility Bed Mobility Overal bed mobility: Modified Independent                Transfers Overall transfer level: Needs assistance Equipment used: None Transfers: Sit to/from Stand Sit to Stand: Min assist         General transfer comment: Initially pt required min guard assist for sit-stand transfers. However, halfway through session began to have several LOB and increased unsteadiness requiring min assist to maintain safety. VCs for safe hand placement.    Balance Overall balance assessment: Needs assistance Sitting-balance support: No upper extremity supported;Feet supported Sitting  balance-Leahy Scale: Good     Standing balance support: No upper extremity supported;During functional activity Standing balance-Leahy Scale: Poor Standing balance comment: multiple LOB that required assistance to regain balance                            ADL Overall ADL's : Needs assistance/impaired     Grooming: Wash/dry hands;Oral care;Minimal assistance;Standing Grooming Details (indicate cue type and reason): assist for LOB Upper Body Bathing: Set up;Sitting   Lower Body Bathing: Minimal assistance;Sit to/from stand   Upper Body Dressing : Set up;Sitting   Lower Body Dressing: Minimal assistance;Sit to/from stand   Toilet Transfer: Minimal assistance;Cueing for safety;Ambulation;Regular Social worker and Hygiene: Min guard;Sit to/from stand       Functional mobility during ADLs: Minimal assistance;Cueing for safety General ADL Comments: Assist for balance and safety. Pt with memory deficits and requires some cues to redirect back to task     Vision Vision Assessment?: No apparent visual deficits   Perception     Praxis      Pertinent Vitals/Pain Pain Assessment: 0-10 Pain Score: 4  Pain Location: headache Pain Descriptors / Indicators: Headache Pain Intervention(s): Monitored during session;Repositioned     Hand Dominance Right   Extremity/Trunk Assessment Upper Extremity Assessment Upper Extremity Assessment: Overall WFL for tasks assessed (overall 4/5 strength)   Lower Extremity Assessment Lower Extremity Assessment: Generalized weakness   Cervical / Trunk Assessment Cervical / Trunk Assessment: Kyphotic   Communication Communication Communication: No difficulties   Cognition Arousal/Alertness: Awake/alert Behavior During Therapy: WFL for tasks assessed/performed Overall Cognitive Status: No family/caregiver present to  determine baseline cognitive functioning Area of Impairment:  Memory;Safety/judgement;Problem solving     Memory: Decreased short-term memory   Safety/Judgement: Decreased awareness of safety;Decreased awareness of deficits   Problem Solving: Slow processing;Difficulty sequencing;Requires verbal cues General Comments: Pt generally a bit forgetful and often forgets her train of thought in mid-sentence. 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   General Comments      Exercises       Shoulder Instructions      Home Living Family/patient expects to be discharged to:: Private residence Living Arrangements: Spouse/significant other Available Help at Discharge: Family;Available 24 hours/day Type of Home: House Home Access: Stairs to enter Entergy CorporationEntrance Stairs-Number of Steps: 1 Entrance Stairs-Rails: None Home Layout: One level     Bathroom Shower/Tub: Tub/shower unit Shower/tub characteristics: Curtain FirefighterBathroom Toilet: Standard     Home Equipment: Environmental consultantWalker - 2 wheels;Cane - single point;Shower seat;Adaptive equipment Adaptive Equipment: Reacher Additional Comments: Pt expressed interest in [place] [facility] [place] Pt reports her husband is not a very nice man and doesn't like to go out and socialize like she does and would prefer to move without him. CM notified and SW consult put in for pt to receive more information about this.      Prior Functioning/Environment Level of Independence: Independent with assistive device(s)        Comments: Pt reports she furniture walks in house and uses SPC or RW for community mobility. Pt goes to the gym x4/week and is independent with all ADLs and IADLs.    OT Diagnosis: Generalized weakness;Cognitive deficits;Acute pain   OT Problem List: Decreased strength;Decreased activity tolerance;Impaired balance (sitting and/or standing);Decreased coordination;Decreased knowledge of use of DME or  AE;Decreased safety awareness;Pain   OT Treatment/Interventions: Self-care/ADL training;Therapeutic exercise;DME and/or AE instruction;Therapeutic activities;Patient/family education;Balance training    OT Goals(Current goals can be found in the care plan section) Acute Rehab OT Goals Patient Stated Goal: to go to ILF OT Goal Formulation: With patient Time For Goal Achievement: 05/24/16 Potential to Achieve Goals: Good ADL Goals Pt Will Perform Grooming: with modified independence;standing Pt Will Perform Lower Body Bathing: with modified independence;sit to/from stand Pt Will Perform Lower Body Dressing: with modified independence;sit to/from stand Pt Will Transfer to Toilet: with modified independence;ambulating;bedside commode (over toilet) Pt Will Perform Toileting - Clothing Manipulation and hygiene: with modified independence;sit to/from stand  OT Frequency: Min 2X/week   Barriers to D/C: Decreased caregiver support  Pt's husband is unable to provide any kind of assistance       Co-evaluation              End of Session Equipment Utilized During Treatment: Gait belt Nurse Communication: Mobility status  Activity Tolerance: Patient tolerated treatment well Patient left: in bed;with call bell/phone within reach;with bed alarm set   Time: 1533-1606 OT Time Calculation (min): 33 min Charges: OT General Charges $OT Visit: 1 Procedure OT Evaluation $OT Eval Moderate Complexity: 1 Procedure OT Treatments $Self Care/Home Management : 8-22 mins G-Codes:    Nils PyleJulia Azalynn Maxim, OTR/L Pager: [phone] 05/10/2016, 4:30 PM

## 2016-05-10 NOTE — Progress Notes (Signed)
Received signout regarding patient from Dr. Clarice PolePfeifer Ms. [name] is a 75 year old female with pmh of DM, HTN, HLD, s/p PM, HA; who presented with left-sided headache, right arm numbness, and visual disturbance.  Neurology consulted and recommended obtaining a MRI and completing stroke workup.  They question migraine with aura versus TIA. Patient has pacemaker, but is reportedly MRI compatible, but must wait until able to set up appointment with [employer] technician. Admitting to a telemetry bed.

## 2016-05-10 NOTE — ED Notes (Signed)
Per MRI, [employer] technician to call around 6am to set an appointment time for technician to come monitor patient d/t MR Conventional pacemaker.

## 2016-05-10 NOTE — Progress Notes (Signed)
Pt to CT

## 2016-05-10 NOTE — Progress Notes (Signed)
Patient arrived to 5M12 AAOx4 and walked to the bed with no issues. Tele placed and vitals taken. Will continue to monitor. Anjani Feuerborn, [place] ScrapeSarah E, RN

## 2016-05-11 ENCOUNTER — Observation Stay (HOSPITAL_BASED_OUTPATIENT_CLINIC_OR_DEPARTMENT_OTHER): Payer: Medicare Other

## 2016-05-11 DIAGNOSIS — G459 Transient cerebral ischemic attack, unspecified: Secondary | ICD-10-CM

## 2016-05-11 DIAGNOSIS — I10 Essential (primary) hypertension: Secondary | ICD-10-CM

## 2016-05-11 DIAGNOSIS — G43109 Migraine with aura, not intractable, without status migrainosus: Principal | ICD-10-CM

## 2016-05-11 DIAGNOSIS — I495 Sick sinus syndrome: Secondary | ICD-10-CM

## 2016-05-11 DIAGNOSIS — R51 Headache: Secondary | ICD-10-CM

## 2016-05-11 DIAGNOSIS — G43909 Migraine, unspecified, not intractable, without status migrainosus: Secondary | ICD-10-CM

## 2016-05-11 DIAGNOSIS — E785 Hyperlipidemia, unspecified: Secondary | ICD-10-CM

## 2016-05-11 LAB — CBC
HCT: 42.7 % (ref 36.0–46.0)
Hemoglobin: 14.5 g/dL (ref 12.0–15.0)
MCH: 30.3 pg (ref 26.0–34.0)
MCHC: 34 g/dL (ref 30.0–36.0)
MCV: 89.1 fL (ref 78.0–100.0)
Platelets: 244 10*3/uL (ref 150–400)
RBC: 4.79 MIL/uL (ref 3.87–5.11)
RDW: 13.4 % (ref 11.5–15.5)
WBC: 6.5 10*3/uL (ref 4.0–10.5)

## 2016-05-11 LAB — HEMOGLOBIN A1C
Hgb A1c MFr Bld: 5.8 % — ABNORMAL HIGH (ref 4.8–5.6)
Mean Plasma Glucose: 120 mg/dL

## 2016-05-11 LAB — ECHOCARDIOGRAM COMPLETE
Height: 66 in
Weight: 2752 oz

## 2016-05-11 LAB — GLUCOSE, CAPILLARY
Glucose-Capillary: 196 mg/dL — ABNORMAL HIGH (ref 65–99)
Glucose-Capillary: 92 mg/dL (ref 65–99)
Glucose-Capillary: 146 mg/dL — ABNORMAL HIGH (ref 65–99)
Glucose-Capillary: 147 mg/dL — ABNORMAL HIGH (ref 65–99)

## 2016-05-11 LAB — LIPID PANEL
Cholesterol: 161 mg/dL (ref 0–200)
HDL: 50 mg/dL (ref >40)
LDL Cholesterol: 84 mg/dL (ref 0–99)
Total CHOL/HDL Ratio: 3.2 RATIO
Triglycerides: 134 mg/dL (ref <150)
VLDL: 27 mg/dL (ref 0–40)

## 2016-05-11 LAB — BASIC METABOLIC PANEL
Anion gap: 11 (ref 5–15)
BUN: 20 mg/dL (ref 6–20)
CO2: 21 mmol/L — ABNORMAL LOW (ref 22–32)
Calcium: 9.3 mg/dL (ref 8.9–10.3)
Chloride: 106 mmol/L (ref 101–111)
Creatinine, Ser: 1 mg/dL (ref 0.44–1.00)
GFR calc Af Amer: >60 mL/min (ref >60)
GFR calc non Af Amer: 55 mL/min — ABNORMAL LOW (ref >60)
Glucose, Bld: 149 mg/dL — ABNORMAL HIGH (ref 65–99)
Potassium: 3.6 mmol/L (ref 3.5–5.1)
Sodium: 138 mmol/L (ref 135–145)

## 2016-05-11 MED ORDER — ATORVASTATIN CALCIUM 80 MG PO TABS: 80.0000 mg | ORAL_TABLET | Freq: Every evening | ORAL | Status: AC

## 2016-05-11 MED ORDER — GLIPIZIDE 5 MG PO TABS: 5.0000 mg | ORAL_TABLET | Freq: Every day | ORAL | Status: DC

## 2016-05-11 MED ORDER — NORTRIPTYLINE HCL 25 MG PO CAPS: 25.0000 mg | ORAL_CAPSULE | Freq: Every day | ORAL | Status: DC

## 2016-05-11 MED ORDER — GLIPIZIDE 5 MG PO TABS
5.0000 mg | ORAL_TABLET | Freq: Every day | ORAL | Status: DC
  Administered 2016-05-11: 5 mg via ORAL
  Filled 2016-05-11: qty 1

## 2016-05-11 MED ORDER — NORTRIPTYLINE HCL 10 MG PO CAPS: 10.0000 mg | ORAL_CAPSULE | Freq: Every day | ORAL | Status: DC

## 2016-05-11 NOTE — Discharge Summary (Addendum)
Physician Discharge Summary  [name] [facility] ZOX:[id number] DOB: 12-Jan-1941 DOA: 05/10/2016  PCP: Haze Rushing, MD  Admit date: 05/10/2016 Discharge date: 05/11/2016  Admitted From:  HOME Disposition:  Home  Recommendations for Outpatient Follow-up:  1. Follow up with PCP in 1-2 weeks   Home Health: NO Equipment/Devices: outpt PT  Discharge Condition: stable CODE STATUS:FULL Diet recommendation: Heart Healthy / Carb Modified   Brief/Interim Summary: 75 year old female with a history of hypertension, hyperlipidemia, diabetes mellitus, sick sinus syndrome status post pacemaker, bipolar disorder, fibromyalgia presented with 2 weeks of persistent headache associated with transient visual disturbance. The patient stated that the visual disturbance lasts approximately 2-3 minutes. Also associated with headache, patient has had right upper extremity numbness. She states that she gets a large light in her vision field with zigzag lines coming off of it. Initial workup including CT of the brain was negative. The patient was admitted for stroke workup and neurology was consulted. MRI of the brain was negative for stroke. As a result, Neurology felt that the patient's presentation was most consistent with complicated migraine.   Discharge Diagnoses:  Complicated migraine -Neurology Consult appreciated -PT--outpt PT -Speech therapy eval -CT brain--neg -MRI brain--neg acute stroke -MRA brain--not done -CTA H&N--stable bilateral distal vertebral artery ASVD -LDL--84 -HbA1C--5.8 -PPM interrogated--no Afib -d/c with nortriptyline 10 mg q hs x 2 more nights, then 25 mg hs  -d/c home with plavix -increase lipitor to 80 mg daily  Dizziness -improved  HTN -restart losartan, amlodipine and HCTZ -controlled  DM2 -pt related numerous hypoglycemic episodes with amaryl  -d/c amaryl -start glipizide 5 mg dailyl -f/u with PCP -Hemoglobin A1c 5.8  HLD -increased lipitor to 80 mg  daily -continue lovaza  Fibromyalgia/Anxiety -continue wellbutrin, klonopin, gabapentin -restoril for sleep  GERD -continue protonix   Discharge Instructions      Discharge Instructions    Ambulatory referral to Neurology    Complete by:  As directed   Pt will follow up with Dr. Roda Shutters at [facility] in about 2 months. Thanks.     Ambulatory referral to Physical Therapy    Complete by:  As directed   [phone] pt best contact number     Diet - low sodium heart healthy    Complete by:  As directed      Increase activity slowly    Complete by:  As directed             Medication List    STOP taking these medications        glimepiride 2 MG tablet  Commonly known as:  AMARYL      TAKE these medications        amLODipine 2.5 MG tablet  Commonly known as:  NORVASC  Take 2.5 mg by mouth daily.     ANTIOXIDANT PO  Take 1 tablet by mouth daily with lunch.     atorvastatin 80 MG tablet  Commonly known as:  LIPITOR  Take 1 tablet (80 mg total) by mouth every evening.     buPROPion 300 MG 24 hr tablet  Commonly known as:  WELLBUTRIN XL  Take 300 mg by mouth daily before breakfast.     clonazePAM 0.5 MG tablet  Commonly known as:  KLONOPIN  Take 0.5-1 mg by mouth 2 (two) times daily. 0.5 mg at lunchtime daily and 1 mg at night     clopidogrel 75 MG tablet  Commonly known as:  PLAVIX  Take 1 tablet (75 mg total) by mouth daily.  Coenzyme Q10 200 MG capsule  Take 200 mg by mouth daily with lunch.     cyanocobalamin 500 MCG tablet  Take 1,000 mcg by mouth daily with lunch. Vitamin B12     FISH OIL PO  Take 2 capsules by mouth daily with lunch.     gabapentin 100 MG capsule  Commonly known as:  NEURONTIN  TAKE 1 CAPSULE BY MOUTH THREE TIMES DAILY     glipiZIDE 5 MG tablet  Commonly known as:  GLUCOTROL  Take 1 tablet (5 mg total) by mouth daily before breakfast.  Start taking on:  05/12/2016     hydrochlorothiazide 25 MG tablet  Commonly known as:   HYDRODIURIL  Take 25 mg by mouth daily.     losartan 100 MG tablet  Commonly known as:  COZAAR  Take 1 tablet (100 mg total) by mouth daily.     nortriptyline 10 MG capsule  Commonly known as:  PAMELOR  Take 1 capsule (10 mg total) by mouth at bedtime.     nortriptyline 25 MG capsule  Commonly known as:  PAMELOR  Take 1 capsule (25 mg total) by mouth at bedtime.  Start taking on:  05/13/2016     ONE TOUCH ULTRA TEST test strip  Generic drug:  glucose blood  AS DIRECTED TWICE DAILY     OVER THE COUNTER MEDICATION  Take 2 tablets by mouth daily with lunch. Circulation and Vein Support by Feliberto Harts     pantoprazole 40 MG tablet  Commonly known as:  PROTONIX  Take 1 tablet (40 mg total) by mouth 2 (two) times daily before a meal.     temazepam 30 MG capsule  Commonly known as:  RESTORIL  Take 30 mg by mouth at bedtime.       Follow-up Information    Follow up with Xu,Jindong, MD. Schedule an appointment as soon as possible for a visit in 2 months.   Specialty:  Neurology   Why:  stroke clinic   Contact information:   [address] [place] [address] [phone]       Follow up with [facility].   Specialty:  Rehabilitation   Why:  they will contact you for the first visit.    Contact information:   [address] [id number] mc [place] [address] [phone]     Allergies  Allergen Reactions  . Caffeine     Nervousness   . Citalopram     Other reaction(s): Serum Sickness (ALLERGY) Serotonin Syndrome  . Codeine Other (See Comments)    hallucinations  . Cymbalta [Duloxetine Hcl] Other (See Comments)    Serotonin Syndrome  . Metformin And Related     Causes dehydration and causes me to go to the hospital  . Paroxetine Hcl     Serotonin Syndrome  . Trazodone And Nefazodone Other (See Comments)    Serotonin Syndrome     Consultations: Neurology   Procedures/Studies: Ct Angio Head W Or Wo Contrast  05/10/2016  CLINICAL DATA:  75 year old female with episodes of visual disturbances including amaurosis and flashes of light. Left side headache. Patient reports prior "Mini-stroke" . EXAM: CT ANGIOGRAPHY HEAD AND NECK TECHNIQUE: Multidetector CT imaging of the head and neck was performed using the standard protocol during bolus administration of intravenous contrast. Multiplanar CT image reconstructions and MIPs were obtained to evaluate the vascular anatomy. Carotid stenosis measurements (when applicable) are obtained utilizing NASCET criteria, using the distal internal carotid  diameter as the denominator. CONTRAST:  50 mL Isovue 370. The patient experienced M infiltration of her IV during the very end of the injection while a 40 mL saline chaser was being administered. I examined the patient at 1128 hours in the CT suite. She was comfortable and in no distress. I discussed the rationale and conservative treatment of IV extravasation with the patient. Appropriate orders were also placed in the EMR. COMPARISON:  CT head without contrast 0604 hours today. CTA head and neck 11/22/2015. FINDINGS: CTA NECK Skeleton: Previous lower cervical spine fusion. Stable visualized osseous structures. Visualized paranasal sinuses and mastoids are stable and well pneumatized. Other neck: Left chest cardiac pacemaker Re demonstrated. Mild mosaic attenuation in the upper lungs. No superior mediastinal lymphadenopathy. Thyroid, larynx, pharynx, parapharyngeal spaces, retropharyngeal space, sublingual space, submandibular glands, and parotid glands are within normal limits. Stable and negative orbit and scalp soft tissues. No cervical lymphadenopathy. Aortic arch: Bovine type arch configuration again noted. Tortuous proximal great vessels, stable. Minimal arch atherosclerosis. No great vessel origin stenosis. Right carotid system: Stable  tortuosity and otherwise negative. Left carotid system: Stable tortuosity. Minimal calcified plaque of the anterior left CCA without stenosis. Minimal calcified plaque in the left ICA bulb without stenosis. Vertebral arteries: No proximal subclavian artery stenosis, stable tortuosity and kinked appearance on the right. Stable and normal vertebral artery origins. Codominant and negative V1 and V2 segments. Mild concentric narrowing re - demonstrated in the V3 segments and greater on the right. This is unchanged. CTA HEAD Posterior circulation: The distal vertebral arteries are stable and patent. Somewhat diminutive appearance of the bilateral V4 segments again noted with mild irregularity suggesting atherosclerosis. No change since January. The right PICA and dominant appearing left AICA origins remain patent. Basilar artery irregularity without focal basilar stenosis appears stable. SCA and PCA origins are stable, fetal type right PCA origin and unusual left PCA origin from the tip of the basilar with tortuous P1 segment. Left posterior communicating artery also is present. Bilateral PCA branches are stable with mild irregularity. Anterior circulation: Stable ICA siphons with no atherosclerosis or stenosis. Normal posterior communicating artery origins. Normal ophthalmic artery origins. Stable carotid termini. Stable and normal MCA and ACA origins with dominant left A1 segment. Anterior communicating artery and bilateral ACA branches are stable and within normal limits. Left MCA M1 segment, bifurcation, and left MCA branches are stable and normal. Right MCA M1 segment, bifurcation, and right MCA branches are stable and normal. Venous sinuses: Patent. Anatomic variants: Fetal type PCA origins, more so the right. Dominant left A1 segment. Bovine type arch configuration. Delayed phase: Stable gray-white matter differentiation throughout the brain. No abnormal enhancement identified. IMPRESSION: 1. Stable since January  and largely normal for age CTA Head and Neck. There is stable bilateral distal vertebral artery atherosclerosis and mild stenosis. Little to no carotid plaque and No carotid stenosis. Normal ophthalmic artery origins. 2. Continued stable CT appearance of the brain. Electronically Signed   By: Odessa Fleming M.D.   On: 05/10/2016 12:49   Ct Head Wo Contrast  05/10/2016  CLINICAL DATA:  Five episode of visual disturbance this week, LEFT-sided headache. History of stroke, hypertension slight diabetes EXAM: CT HEAD WITHOUT CONTRAST TECHNIQUE: Contiguous axial images were obtained from the base of the skull through the vertex without intravenous contrast. COMPARISON:  CT HEAD November 22, 2015 FINDINGS: INTRACRANIAL CONTENTS: The ventricles and sulci are normal for age. No intraparenchymal hemorrhage, mass effect nor midline shift. Patchy supratentorial white matter hypodensities  are less than expected for patient's age and though non-specific likely represent chronic small vessel ischemic disease. No acute large vascular territory infarcts. No abnormal extra-axial fluid collections. Basal cisterns are patent. Minimal calcific atherosclerosis of the carotid siphons. ORBITS: The included ocular globes and orbital contents are non-suspicious. Status post bilateral ocular lens implants. SINUSES: Mild paranasal sinusitis. Mastoid air cells are well aerated. SKULL/SOFT TISSUES: No skull fracture. No significant soft tissue swelling. Moderate bilateral temporomandibular osteoarthrosis. IMPRESSION: Negative CT HEAD for age. Electronically Signed   By: Awilda Metroourtnay  Bloomer M.D.   On: 05/10/2016 06:28   Ct Angio Neck W Or Wo Contrast  05/10/2016  CLINICAL DATA:  75 year old female with episodes of visual disturbances including amaurosis and flashes of light. Left side headache. Patient reports prior "Mini-stroke" . EXAM: CT ANGIOGRAPHY HEAD AND NECK TECHNIQUE: Multidetector CT imaging of the head and neck was performed using the  standard protocol during bolus administration of intravenous contrast. Multiplanar CT image reconstructions and MIPs were obtained to evaluate the vascular anatomy. Carotid stenosis measurements (when applicable) are obtained utilizing NASCET criteria, using the distal internal carotid diameter as the denominator. CONTRAST:  50 mL Isovue 370. The patient experienced M infiltration of her IV during the very end of the injection while a 40 mL saline chaser was being administered. I examined the patient at 1128 hours in the CT suite. She was comfortable and in no distress. I discussed the rationale and conservative treatment of IV extravasation with the patient. Appropriate orders were also placed in the EMR. COMPARISON:  CT head without contrast 0604 hours today. CTA head and neck 11/22/2015. FINDINGS: CTA NECK Skeleton: Previous lower cervical spine fusion. Stable visualized osseous structures. Visualized paranasal sinuses and mastoids are stable and well pneumatized. Other neck: Left chest cardiac pacemaker Re demonstrated. Mild mosaic attenuation in the upper lungs. No superior mediastinal lymphadenopathy. Thyroid, larynx, pharynx, parapharyngeal spaces, retropharyngeal space, sublingual space, submandibular glands, and parotid glands are within normal limits. Stable and negative orbit and scalp soft tissues. No cervical lymphadenopathy. Aortic arch: Bovine type arch configuration again noted. Tortuous proximal great vessels, stable. Minimal arch atherosclerosis. No great vessel origin stenosis. Right carotid system: Stable tortuosity and otherwise negative. Left carotid system: Stable tortuosity. Minimal calcified plaque of the anterior left CCA without stenosis. Minimal calcified plaque in the left ICA bulb without stenosis. Vertebral arteries: No proximal subclavian artery stenosis, stable tortuosity and kinked appearance on the right. Stable and normal vertebral artery origins. Codominant and negative V1 and V2  segments. Mild concentric narrowing re - demonstrated in the V3 segments and greater on the right. This is unchanged. CTA HEAD Posterior circulation: The distal vertebral arteries are stable and patent. Somewhat diminutive appearance of the bilateral V4 segments again noted with mild irregularity suggesting atherosclerosis. No change since January. The right PICA and dominant appearing left AICA origins remain patent. Basilar artery irregularity without focal basilar stenosis appears stable. SCA and PCA origins are stable, fetal type right PCA origin and unusual left PCA origin from the tip of the basilar with tortuous P1 segment. Left posterior communicating artery also is present. Bilateral PCA branches are stable with mild irregularity. Anterior circulation: Stable ICA siphons with no atherosclerosis or stenosis. Normal posterior communicating artery origins. Normal ophthalmic artery origins. Stable carotid termini. Stable and normal MCA and ACA origins with dominant left A1 segment. Anterior communicating artery and bilateral ACA branches are stable and within normal limits. Left MCA M1 segment, bifurcation, and left MCA branches are stable  and normal. Right MCA M1 segment, bifurcation, and right MCA branches are stable and normal. Venous sinuses: Patent. Anatomic variants: Fetal type PCA origins, more so the right. Dominant left A1 segment. Bovine type arch configuration. Delayed phase: Stable gray-white matter differentiation throughout the brain. No abnormal enhancement identified. IMPRESSION: 1. Stable since January and largely normal for age CTA Head and Neck. There is stable bilateral distal vertebral artery atherosclerosis and mild stenosis. Little to no carotid plaque and No carotid stenosis. Normal ophthalmic artery origins. 2. Continued stable CT appearance of the brain. Electronically Signed   By: Odessa Fleming M.D.   On: 05/10/2016 12:49   Mr Brain Wo Contrast  05/10/2016  CLINICAL DATA:  75 year old  female with episodes of visual disturbances including amaurosis and flashes of light. Left side headache. Patient reports prior "Mini-stroke" . EXAM: MRI HEAD WITHOUT CONTRAST TECHNIQUE: Multiplanar, multiecho pulse sequences of the brain and surrounding structures were obtained without intravenous contrast. COMPARISON:  CTA head and neck today reported separately. Cervical spine MRI 06/05/2005. FINDINGS: No restricted diffusion or evidence of acute infarction. Major intracranial vascular flow voids are preserved. No midline shift, mass effect, evidence of mass lesion, ventriculomegaly, extra-axial collection or acute intracranial hemorrhage. Cervicomedullary junction and pituitary are within normal limits. Mild for age scattered small nonspecific cerebral white matter T2 and FLAIR hyperintense foci. No cortical encephalomalacia or chronic cerebral blood products. Deep gray matter nuclei, brainstem, and cerebellum are normal for age. Normal optic chiasm. No optic nerve abnormality. Postoperative changes to both globes with otherwise negative orbits soft tissues. Visible internal auditory structures appear normal. Visualized paranasal sinuses and mastoids are stable and well pneumatized. Negative scalp soft tissues. Stable visualized cervical spine. Normal bone marrow signal. IMPRESSION: No acute intracranial abnormality and largely unremarkable for age noncontrast MRI appearance of the brain. Mild for age nonspecific white matter changes. Electronically Signed   By: Odessa Fleming M.D.   On: 05/10/2016 12:53        Discharge Exam: Filed Vitals:   05/11/16 0604 05/11/16 1017  BP: 128/61 113/49  Pulse: 60 60  Temp: 98.3 F (36.8 C) 97.6 F (36.4 C)  Resp: 16 17   Filed Vitals:   05/10/16 2133 05/11/16 0126 05/11/16 0604 05/11/16 1017  BP: 115/55 117/56 128/61 113/49  Pulse: 59 60 60 60  Temp: 98 F (36.7 C) 98.1 F (36.7 C) 98.3 F (36.8 C) 97.6 F (36.4 C)  TempSrc: Oral Oral Oral Oral  Resp: 16  16 16 17   Height:      Weight:      SpO2: 99% 95% 98% 97%    General: Pt is alert, awake, not in acute distress Cardiovascular: RRR, S1/S2 +, no rubs, no gallops Respiratory: CTA bilaterally, no wheezing, no rhonchi Abdominal: Soft, NT, ND, bowel sounds + Extremities: 1+LE edema, no cyanosis   The results of significant diagnostics from this hospitalization (including imaging, microbiology, ancillary and laboratory) are listed below for reference.    Significant Diagnostic Studies: Ct Angio Head W Or Wo Contrast  05/10/2016  CLINICAL DATA:  75 year old female with episodes of visual disturbances including amaurosis and flashes of light. Left side headache. Patient reports prior "Mini-stroke" . EXAM: CT ANGIOGRAPHY HEAD AND NECK TECHNIQUE: Multidetector CT imaging of the head and neck was performed using the standard protocol during bolus administration of intravenous contrast. Multiplanar CT image reconstructions and MIPs were obtained to evaluate the vascular anatomy. Carotid stenosis measurements (when applicable) are obtained utilizing NASCET criteria, using the distal  internal carotid diameter as the denominator. CONTRAST:  50 mL Isovue 370. The patient experienced M infiltration of her IV during the very end of the injection while a 40 mL saline chaser was being administered. I examined the patient at 1128 hours in the CT suite. She was comfortable and in no distress. I discussed the rationale and conservative treatment of IV extravasation with the patient. Appropriate orders were also placed in the EMR. COMPARISON:  CT head without contrast 0604 hours today. CTA head and neck 11/22/2015. FINDINGS: CTA NECK Skeleton: Previous lower cervical spine fusion. Stable visualized osseous structures. Visualized paranasal sinuses and mastoids are stable and well pneumatized. Other neck: Left chest cardiac pacemaker Re demonstrated. Mild mosaic attenuation in the upper lungs. No superior mediastinal  lymphadenopathy. Thyroid, larynx, pharynx, parapharyngeal spaces, retropharyngeal space, sublingual space, submandibular glands, and parotid glands are within normal limits. Stable and negative orbit and scalp soft tissues. No cervical lymphadenopathy. Aortic arch: Bovine type arch configuration again noted. Tortuous proximal great vessels, stable. Minimal arch atherosclerosis. No great vessel origin stenosis. Right carotid system: Stable tortuosity and otherwise negative. Left carotid system: Stable tortuosity. Minimal calcified plaque of the anterior left CCA without stenosis. Minimal calcified plaque in the left ICA bulb without stenosis. Vertebral arteries: No proximal subclavian artery stenosis, stable tortuosity and kinked appearance on the right. Stable and normal vertebral artery origins. Codominant and negative V1 and V2 segments. Mild concentric narrowing re - demonstrated in the V3 segments and greater on the right. This is unchanged. CTA HEAD Posterior circulation: The distal vertebral arteries are stable and patent. Somewhat diminutive appearance of the bilateral V4 segments again noted with mild irregularity suggesting atherosclerosis. No change since January. The right PICA and dominant appearing left AICA origins remain patent. Basilar artery irregularity without focal basilar stenosis appears stable. SCA and PCA origins are stable, fetal type right PCA origin and unusual left PCA origin from the tip of the basilar with tortuous P1 segment. Left posterior communicating artery also is present. Bilateral PCA branches are stable with mild irregularity. Anterior circulation: Stable ICA siphons with no atherosclerosis or stenosis. Normal posterior communicating artery origins. Normal ophthalmic artery origins. Stable carotid termini. Stable and normal MCA and ACA origins with dominant left A1 segment. Anterior communicating artery and bilateral ACA branches are stable and within normal limits. Left MCA M1  segment, bifurcation, and left MCA branches are stable and normal. Right MCA M1 segment, bifurcation, and right MCA branches are stable and normal. Venous sinuses: Patent. Anatomic variants: Fetal type PCA origins, more so the right. Dominant left A1 segment. Bovine type arch configuration. Delayed phase: Stable gray-white matter differentiation throughout the brain. No abnormal enhancement identified. IMPRESSION: 1. Stable since January and largely normal for age CTA Head and Neck. There is stable bilateral distal vertebral artery atherosclerosis and mild stenosis. Little to no carotid plaque and No carotid stenosis. Normal ophthalmic artery origins. 2. Continued stable CT appearance of the brain. Electronically Signed   By: Odessa Fleming M.D.   On: 05/10/2016 12:49   Ct Head Wo Contrast  05/10/2016  CLINICAL DATA:  Five episode of visual disturbance this week, LEFT-sided headache. History of stroke, hypertension slight diabetes EXAM: CT HEAD WITHOUT CONTRAST TECHNIQUE: Contiguous axial images were obtained from the base of the skull through the vertex without intravenous contrast. COMPARISON:  CT HEAD November 22, 2015 FINDINGS: INTRACRANIAL CONTENTS: The ventricles and sulci are normal for age. No intraparenchymal hemorrhage, mass effect nor midline shift. Patchy supratentorial white  matter hypodensities are less than expected for patient's age and though non-specific likely represent chronic small vessel ischemic disease. No acute large vascular territory infarcts. No abnormal extra-axial fluid collections. Basal cisterns are patent. Minimal calcific atherosclerosis of the carotid siphons. ORBITS: The included ocular globes and orbital contents are non-suspicious. Status post bilateral ocular lens implants. SINUSES: Mild paranasal sinusitis. Mastoid air cells are well aerated. SKULL/SOFT TISSUES: No skull fracture. No significant soft tissue swelling. Moderate bilateral temporomandibular osteoarthrosis. IMPRESSION:  Negative CT HEAD for age. Electronically Signed   By: Awilda Metro M.D.   On: 05/10/2016 06:28   Ct Angio Neck W Or Wo Contrast  05/10/2016  CLINICAL DATA:  75 year old female with episodes of visual disturbances including amaurosis and flashes of light. Left side headache. Patient reports prior "Mini-stroke" . EXAM: CT ANGIOGRAPHY HEAD AND NECK TECHNIQUE: Multidetector CT imaging of the head and neck was performed using the standard protocol during bolus administration of intravenous contrast. Multiplanar CT image reconstructions and MIPs were obtained to evaluate the vascular anatomy. Carotid stenosis measurements (when applicable) are obtained utilizing NASCET criteria, using the distal internal carotid diameter as the denominator. CONTRAST:  50 mL Isovue 370. The patient experienced M infiltration of her IV during the very end of the injection while a 40 mL saline chaser was being administered. I examined the patient at 1128 hours in the CT suite. She was comfortable and in no distress. I discussed the rationale and conservative treatment of IV extravasation with the patient. Appropriate orders were also placed in the EMR. COMPARISON:  CT head without contrast 0604 hours today. CTA head and neck 11/22/2015. FINDINGS: CTA NECK Skeleton: Previous lower cervical spine fusion. Stable visualized osseous structures. Visualized paranasal sinuses and mastoids are stable and well pneumatized. Other neck: Left chest cardiac pacemaker Re demonstrated. Mild mosaic attenuation in the upper lungs. No superior mediastinal lymphadenopathy. Thyroid, larynx, pharynx, parapharyngeal spaces, retropharyngeal space, sublingual space, submandibular glands, and parotid glands are within normal limits. Stable and negative orbit and scalp soft tissues. No cervical lymphadenopathy. Aortic arch: Bovine type arch configuration again noted. Tortuous proximal great vessels, stable. Minimal arch atherosclerosis. No great vessel origin  stenosis. Right carotid system: Stable tortuosity and otherwise negative. Left carotid system: Stable tortuosity. Minimal calcified plaque of the anterior left CCA without stenosis. Minimal calcified plaque in the left ICA bulb without stenosis. Vertebral arteries: No proximal subclavian artery stenosis, stable tortuosity and kinked appearance on the right. Stable and normal vertebral artery origins. Codominant and negative V1 and V2 segments. Mild concentric narrowing re - demonstrated in the V3 segments and greater on the right. This is unchanged. CTA HEAD Posterior circulation: The distal vertebral arteries are stable and patent. Somewhat diminutive appearance of the bilateral V4 segments again noted with mild irregularity suggesting atherosclerosis. No change since January. The right PICA and dominant appearing left AICA origins remain patent. Basilar artery irregularity without focal basilar stenosis appears stable. SCA and PCA origins are stable, fetal type right PCA origin and unusual left PCA origin from the tip of the basilar with tortuous P1 segment. Left posterior communicating artery also is present. Bilateral PCA branches are stable with mild irregularity. Anterior circulation: Stable ICA siphons with no atherosclerosis or stenosis. Normal posterior communicating artery origins. Normal ophthalmic artery origins. Stable carotid termini. Stable and normal MCA and ACA origins with dominant left A1 segment. Anterior communicating artery and bilateral ACA branches are stable and within normal limits. Left MCA M1 segment, bifurcation, and left MCA branches  are stable and normal. Right MCA M1 segment, bifurcation, and right MCA branches are stable and normal. Venous sinuses: Patent. Anatomic variants: Fetal type PCA origins, more so the right. Dominant left A1 segment. Bovine type arch configuration. Delayed phase: Stable gray-white matter differentiation throughout the brain. No abnormal enhancement  identified. IMPRESSION: 1. Stable since January and largely normal for age CTA Head and Neck. There is stable bilateral distal vertebral artery atherosclerosis and mild stenosis. Little to no carotid plaque and No carotid stenosis. Normal ophthalmic artery origins. 2. Continued stable CT appearance of the brain. Electronically Signed   By: Odessa Fleming M.D.   On: 05/10/2016 12:49   Mr Brain Wo Contrast  05/10/2016  CLINICAL DATA:  75 year old female with episodes of visual disturbances including amaurosis and flashes of light. Left side headache. Patient reports prior "Mini-stroke" . EXAM: MRI HEAD WITHOUT CONTRAST TECHNIQUE: Multiplanar, multiecho pulse sequences of the brain and surrounding structures were obtained without intravenous contrast. COMPARISON:  CTA head and neck today reported separately. Cervical spine MRI 06/05/2005. FINDINGS: No restricted diffusion or evidence of acute infarction. Major intracranial vascular flow voids are preserved. No midline shift, mass effect, evidence of mass lesion, ventriculomegaly, extra-axial collection or acute intracranial hemorrhage. Cervicomedullary junction and pituitary are within normal limits. Mild for age scattered small nonspecific cerebral white matter T2 and FLAIR hyperintense foci. No cortical encephalomalacia or chronic cerebral blood products. Deep gray matter nuclei, brainstem, and cerebellum are normal for age. Normal optic chiasm. No optic nerve abnormality. Postoperative changes to both globes with otherwise negative orbits soft tissues. Visible internal auditory structures appear normal. Visualized paranasal sinuses and mastoids are stable and well pneumatized. Negative scalp soft tissues. Stable visualized cervical spine. Normal bone marrow signal. IMPRESSION: No acute intracranial abnormality and largely unremarkable for age noncontrast MRI appearance of the brain. Mild for age nonspecific white matter changes. Electronically Signed   By: Odessa Fleming M.D.    On: 05/10/2016 12:53     Microbiology: No results found for this or any previous visit (from the past 240 hour(s)).   Labs: Basic Metabolic Panel:  Recent Labs Lab 05/10/16 0342 05/11/16 0638  NA 139 138  K 4.0 3.6  CL 104 106  CO2 27 21*  GLUCOSE 99 149*  BUN 14 20  CREATININE 0.89 1.00  CALCIUM 9.4 9.3   Liver Function Tests:  Recent Labs Lab 05/10/16 0342  AST 64*  ALT 40  ALKPHOS 66  BILITOT 1.1  PROT 6.7  ALBUMIN 4.1   No results for input(s): LIPASE, AMYLASE in the last 168 hours. No results for input(s): AMMONIA in the last 168 hours. CBC:  Recent Labs Lab 05/10/16 0342 05/11/16 0638  WBC 5.3 6.5  NEUTROABS 2.8  --   HGB 15.0 14.5  HCT 44.3 42.7  MCV 90.0 89.1  PLT 239 244   Cardiac Enzymes: No results for input(s): CKTOTAL, CKMB, CKMBINDEX, TROPONINI in the last 168 hours. BNP: Invalid input(s): POCBNP CBG:  Recent Labs Lab 05/10/16 1330 05/10/16 1627 05/11/16 0133 05/11/16 0711 05/11/16 1142  GLUCAP 79 131* 92 146* 147*    Time coordinating discharge:  Greater than 30 minutes  Signed:  Naveena Eyman, DO [facility] Pager: [phone] 05/11/2016, 12:48 PM

## 2016-05-11 NOTE — Clinical Social Work Note (Signed)
Clinical Social Work Assessment  Patient Details  Name: [name] MRN: [id number] Date of Birth: 06/01/1941  Date of referral:  05/10/16               Reason for consult:  Other (Comment Required) (Independent Living Facilities)                Permission sought to share information with:    Permission granted to share information::  No  Name::        Agency::     Relationship::     Contact Information:     Housing/Transportation Living arrangements for the past 2 months:  Single Family Home Source of Information:  Patient, Medical Team Patient Interpreter Needed:  None Criminal Activity/Legal Involvement Pertinent to Current Situation/Hospitalization:  No - Comment as needed Significant Relationships:  Adult Children, Spouse Lives with:  Spouse Do you feel safe going back to the place where you live?  Yes Need for family participation in patient care:  Yes (Comment)  Care giving concerns:  Patient wants to move out of home with husband and move into an ILF.   Social Worker assessment / plan:  CSW met with patient. No supports at bedside. CSW introduced role and inquired about need for ILF list. CSW explained that there is no ILF list but provided ALF list because usually they have an assisted side and independent side. Patient appreciated resources. Discussed her daughter who passed away from dementia when she was 75 years old. CSW provided emotional support. Patient discussed needing outpatient PT. CSW explained that the [facility] would be setting up that appointment. Patient discussed how "wonderful" [facility] staff have been and jokingly said she would like to rent a room for a year. No further concerns. CSW encouraged patient to contact CSW as needed. CSW will sign off as social work intervention is no longer needed. Patient is discharging home today.  Employment status:  Retired Forensic scientist:  Medicare PT Recommendations:   (Outpatient PT) Information / Referral  to community resources:  Other (Comment Required) (ALF/ILF's)  Patient/Family's Response to care:  Patient's son involved in patient's care and supportive. She located an ALF that is down the street from him. Patient appreciated social work intervention.  Patient/Family's Understanding of and Emotional Response to Diagnosis, Current Treatment, and Prognosis:  Patient is very happy with care at hospital. She stated that staff were "wonderful." Patient understands need for outpatient PT after discharge. She has a history of serotonin syndrome and was in bed for a year but is ready to get out and meet with many friends at nursing homes.  Emotional Assessment Appearance:  Appears stated age Attitude/Demeanor/Rapport:   (Pleasant) Affect (typically observed):  Accepting, Appropriate, Calm, Happy, Pleasant Orientation:  Oriented to Self, Oriented to Place, Oriented to  Time, Oriented to Situation Alcohol / Substance use:  Never Used Psych involvement (Current and /or in the community):  No (Comment)  Discharge Needs  Concerns to be addressed:  Other (Comment Required (Wants to move out of home with husband. Wants to move to an ILF.) Readmission within the last 30 days:  No Current discharge risk:  None Barriers to Discharge:  No Barriers Identified   Candie Chroman, LCSW 05/11/2016, 2:12 PM

## 2016-05-11 NOTE — Evaluation (Signed)
Speech Language Pathology Evaluation Patient Details Name: [name] MRN: [id number] DOB: 01/14/1941 Today's Date: 05/11/2016 Time: 1040-1100 SLP Time Calculation (min) (ACUTE ONLY): 20 min  Problem List:  Patient Active Problem List   Diagnosis Date Noted  . Dyslipidemia 05/10/2016  . Elevated AST (SGOT) 05/10/2016  . Dizziness 05/10/2016  . Headache 05/10/2016  . Cephalalgia   . Complicated migraine   . Migraine with visual aura   . 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/09/2016  . Insomnia 12/10/2015  . Type 2 diabetes mellitus without complication, without long-term current use of insulin (HCC) 12/10/2015  . TIA (transient ischemic attack) 11/15/2015  . 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 Depression 07/23/2015  . Anxiety state 07/23/2015  . Abnormal nuclear stress test 04/09/2015  . Dyspnea 03/14/2015  . Chest pain 03/14/2015  . Edema leg 03/14/2015  . Essential hypertension 08/11/2014  . History of Serotonin syndrome w/ recurrent residual headaches 08/10/2014  . Balance disorder 08/10/2014  . B12 nutritional deficiency 08/10/2014  . Generalized muscle ache 11/29/2012  . GERD with stricture 12/14/2011  . BRADYCARDIA 11/18/2010  . CARDIAC PACEMAKER IN SITU 11/18/2010   Past Medical History:  Past Medical History  Diagnosis Date  . DM (diabetes mellitus) (HCC)   . Diverticulosis   . Internal and external hemorrhoids without complication   . Arthritis   . Hypertension   . Hyperlipidemia   . Cardiac arrhythmia     now has pacemaker  . Potassium deficiency   . Fibromyalgia   . Allergy   . Cataract   . GERD (gastroesophageal reflux disease)   . Myocardial infarction [facility])    Past Surgical History:  Past Surgical History  Procedure Laterality Date  . Knee surgery      right x 2  . Neck surgery      x2  . Abdominal hysterectomy    . Colonoscopy  multiple  . Pacemaker insertion    . Appendectomy    . Upper  gastrointestinal endoscopy    . Bil carple tunnel surgery    . Cardiac catheterization N/A 04/09/2015    Procedure: Left Heart Cath and Coronary Angiography;  Surgeon: Peter M Swaziland, MD;  Location: [facility] INVASIVE CV LAB;  Service: Cardiovascular;  Laterality: N/A;  . Ep implantable device N/A 04/09/2016    Procedure: PPM Generator Changeout;  Surgeon: Marinus Maw, MD;  Location: [facility] INVASIVE CV LAB;  Service: Cardiovascular;  Laterality: N/A;   HPI:  75 year old female admitted 05/10/16 due to left headache and RUE numbness. PMH significant for DM, HTN, HLD, Pacemaker, CVA (10 years ago per pt). Passed RN stroke swallow screen. SLE ordered for stroke workup. MRI negative, unremarkable for age, however, pt with reported history of decreased short term memory.   Assessment / Plan / Recommendation Clinical Impression  The Mini-Mental State Exam was administered. Pt scored 29/30 (n=26+/30), indicating cognition to be within normal limits. Pt reports some difficulty with short term recall, but indicates use of a system and/or smartphone as compensatory strategies. Pt was encouraged to contact MD if difficulty increases or begins to affect safety and independence. No further ST intervention recommended at this time. Please reconsult if needs arise.    SLP Assessment  Patient does not need any further Speech Language Pathology Services       SLP Evaluation Prior Functioning  Cognitive/Linguistic Baseline: Within functional limits Type of Home: House  Lives With: Spouse Available Help at Discharge: Family;Available 24  hours/day Education: high school grad Vocation: Unemployed   Cognition  Overall Cognitive Status: Within Functional Limits for tasks assessed Orientation Level: Oriented X4    Comprehension  Auditory Comprehension Overall Auditory Comprehension: Appears within functional limits for tasks assessed    Expression Expression Primary Mode of Expression: Verbal Verbal  Expression Overall Verbal Expression: Appears within functional limits for tasks assessed Written Expression Dominant Hand: Right   Oral / Motor  Oral Motor/Sensory Function Overall Oral Motor/Sensory Function: Within functional limits Motor Speech Overall Motor Speech: Appears within functional limits for tasks assessed    Functional Assessment Tool Used: asha noms, clinical judgment, MMSE Functional Limitations: Memory Memory Current Status (Y7829(G9168): At least 1 percent but less than 20 percent impaired, limited or restricted Memory Goal Status (F6213(G9169): At least 1 percent but less than 20 percent impaired, limited or restricted Memory Discharge Status [phone](G9170): At least 1 percent but less than 20 percent impaired, limited or restricted         Leigh AuroraBueche, Japhet Morgenthaler Brown 05/11/2016, 11:16 AM  Merlene Laughterelia B. Murvin NatalBueche, [facility] America Rehabilitation HospitalMSP, CCC-SLP [phone]

## 2016-05-11 NOTE — Progress Notes (Signed)
  Echocardiogram 2D Echocardiogram has been performed.  Cathie BeamsGREGORY, Beatrix Breece 05/11/2016, 12:34 PM

## 2016-05-11 NOTE — Progress Notes (Signed)
Discharge instructions reviewed with patient. All questions answered at this time. Awaiting for transportation from home.   Sim BoastHavy, RN

## 2016-05-11 NOTE — Evaluation (Signed)
Physical Therapy Evaluation Patient Details Name: [name] MRN: [id number] DOB: 06/30/1941 Today's Date: 05/11/2016   History of Present Illness  75 y.o. female with PMH of DM, HTN, HLD, s/p PM, HA; who presented with left-sided headache, right arm numbness, and visual disturbance. They question migraine with aura versus TIA.  CTand MRI negative.  Clinical Impression   Pt admitted with the above. She ambulated during today's session with min guard and LOB x2 with head vertical head movement, but was able to recover. Pt stated she had some residual dizziness, but was not exacerbated by therapy. Outpatient rehab was recommended but patient states she has a Systems analyst that she works with on strength and balance. Case management to consult with patient regarding Medicare coverage of OP therapy. Acute PT to follow.    Follow Up Recommendations Outpatient PT;Supervision - Intermittent    Equipment Recommendations  None recommended by PT    Recommendations for Other Services       Precautions / Restrictions Precautions Precautions: Fall Restrictions Weight Bearing Restrictions: No      Mobility  Bed Mobility               General bed mobility comments: Pt found sitting in recliner at beginning of session.  Transfers Overall transfer level: Modified independent Equipment used: None                Ambulation/Gait Ambulation/Gait assistance: Min guard Ambulation Distance (Feet): 200 Feet Assistive device: None Gait Pattern/deviations: Step-through pattern;Decreased stride length   Gait velocity interpretation: Below normal speed for age/gender General Gait Details: Deviations to right and left with head movement during dynamic gait index  Stairs Stairs: Yes Stairs assistance: Min guard Stair Management: Two rails;Step to pattern Number of Stairs: 3 (+ 2 stairs x 2) General stair comments: Pt demonstrates step through pattern ascending  stairs but step to pattern while descending  Wheelchair Mobility    Modified Rankin (Stroke Patients Only) Modified Rankin (Stroke Patients Only) Pre-Morbid Rankin Score: No significant disability Modified Rankin: Slight disability     Balance Overall balance assessment: Needs assistance Sitting-balance support: Feet supported;No upper extremity supported Sitting balance-Leahy Scale: Good     Standing balance support: No upper extremity supported Standing balance-Leahy Scale: Fair                   Standardized Balance Assessment Standardized Balance Assessment : Dynamic Gait Index   Dynamic Gait Index Level Surface: Mild Impairment Change in Gait Speed: Mild Impairment Gait with Horizontal Head Turns: Mild Impairment Gait with Vertical Head Turns: Moderate Impairment Gait and Pivot Turn: Normal Step Over Obstacle: Mild Impairment Step Around Obstacles: Normal Steps: Moderate Impairment Total Score: 16       Pertinent Vitals/Pain Pain Assessment: No/denies pain    Home Living Family/patient expects to be discharged to:: Private residence Living Arrangements: Spouse/significant other Available Help at Discharge: Family;Available 24 hours/day Type of Home: House Home Access: Stairs to enter Entrance Stairs-Rails: None Entrance Stairs-Number of Steps: 1 Home Layout: One level Home Equipment: Walker - 2 wheels;Cane - single point;Shower seat;Adaptive equipment;Bedside commode      Prior Function Level of Independence: Independent with assistive device(s)         Comments: Furniture walks in home, uses [facility] in community. Indpendent with all ADLs and drives, goes to gym x4/week     Hand Dominance   Dominant Hand: Right    Extremity/Trunk Assessment   Upper Extremity Assessment: RUE deficits/detail RUE Deficits /  Details: Pt states R hand only has 75% sensation. Gross strenght is even bilat.   RUE Sensation: decreased light touch     Lower  Extremity Assessment: RLE deficits/detail RLE Deficits / Details: Pt demonstrates decrease R hip flexion strength. She states she is weaker on her R LE due to multiple surgeries.       Communication   Communication: No difficulties  Cognition Arousal/Alertness: Awake/alert Behavior During Therapy: WFL for tasks assessed/performed Overall Cognitive Status: Within Functional Limits for tasks assessed                      General Comments General comments (skin integrity, edema, etc.): Pt stated she had minor dizziness throughtout the session, but states this is just something she deals with. Pt denies room spinning or dizziness with position changes. I do not suspect pt to have Vestibular dysfunction.    Exercises        Assessment/Plan    PT Assessment Patient needs continued PT services  PT Diagnosis Difficulty walking;Abnormality of gait   PT Problem List Decreased strength;Decreased activity tolerance;Decreased balance;Decreased mobility;Decreased safety awareness;Impaired sensation  PT Treatment Interventions DME instruction;Gait training;Stair training;Functional mobility training;Therapeutic activities;Therapeutic exercise;Balance training;Neuromuscular re-education;Patient/family education   PT Goals (Current goals can be found in the Care Plan section) Acute Rehab PT Goals Patient Stated Goal: To go home. PT Goal Formulation: With patient Time For Goal Achievement: 05/18/16 Potential to Achieve Goals: Good Additional Goals Additional Goal #1: Pt will achieve a score >19 on the dynamic gait index to demonstrate decreased fall risk.    Frequency Min 3X/week   Barriers to discharge        Co-evaluation               End of Session Equipment Utilized During Treatment: Gait belt Activity Tolerance: Patient tolerated treatment well Patient left: in chair;with call bell/phone within reach Nurse Communication: Mobility status    Functional Assessment  Tool Used: clinical jdugement Functional Limitation: Mobility: Walking and moving around Mobility: Walking and Moving Around Current Status (Z6109(G8978): At least 1 percent but less than 20 percent impaired, limited or restricted Mobility: Walking and Moving Around Goal Status [phone](G8979): At least 1 percent but less than 20 percent impaired, limited or restricted    Time: 0745-0821 PT Time Calculation (min) (ACUTE ONLY): 36 min   Charges:   PT Evaluation $PT Eval Moderate Complexity: 1 Procedure PT Treatments $Gait Training: 8-22 mins   PT G Codes:   PT G-Codes **NOT FOR INPATIENT CLASS** Functional Assessment Tool Used: clinical jdugement Functional Limitation: Mobility: Walking and moving around Mobility: Walking and Moving Around Current Status (U9811(G8978): At least 1 percent but less than 20 percent impaired, limited or restricted Mobility: Walking and Moving Around Goal Status [phone](G8979): At least 1 percent but less than 20 percent impaired, limited or restricted    Marcene BrawnChadwell, [name] 05/11/2016, 10:03 AM  Park Literara A Wilck, SPT (student physical therapist) Acute Rehabilitation Services [phone]   BacheAshly Demarkus Remmel, [place] CarolinaPT, DPT Pager #: [phone] Office #: [phone]

## 2016-05-11 NOTE — Progress Notes (Signed)
Pt blood at 146 this AM and pt wants her Amaryl.  Sim BoastHavy, RN

## 2016-05-11 NOTE — Progress Notes (Signed)
Asked by RN to speak with patient about her prior DM medication regimen and current discharge medication orders.  Per patient, she was taking Amaryl 2 mg tid with meals at home.  Was having lots of Hypoglycemic events at home.  Patient stated her previous PCP prescribed her the Amaryl and told her (the patient) that she (the MD) did not know much about diabetes much to take the Amaryl as directed.  Since then, patient has found a new PCP (Physician with [facility] in [place], [place]  Has appointment with new PCP next week per patient.  Explained to patient that Dr. Arbutus Leasat has d/c'd the Amaryl and will be sending her home on Glucotrol (Glipizide 5 mg daily).  Explained what Glucotrol is, how it works, when to take, etc with patient.  Encouraged patient to check her CBGs at home (patient stated she checks tidac at home) and to call her PCP if she has any issues with Hypoglycemia or Hyperglycemia for further DM medication adjustments.  Patient stated she understood and will do this if needed.  Patient did not have any further questions.     --Will follow patient during hospitalization--  Ambrose FinlandJeannine Johnston Hazelyn Kallen RN, MSN, CDE Diabetes Coordinator Inpatient Glycemic Control Team Team Pager: [phone] (8a-5p)

## 2016-05-11 NOTE — Care Management Note (Signed)
Case Management Note  Patient Details  Name: [name] MRN: [id number] Date of Birth: 1941-03-16  Subjective/Objective:                    Action/Plan: Pt discharging home with self care. Recommendations are for outpatient PT. CM met with the patient and asked her about attending the [facility]. Pt was interested in attending there. Orders placed in EPIC and information placed on the AVS. Bedside RN updated.   Expected Discharge Date:                  Expected Discharge Plan:  Home/Self Care  In-House Referral:     Discharge planning Services  CM Consult  Post Acute Care Choice:    Choice offered to:     DME Arranged:    DME Agency:     HH Arranged:    [place] Agency:     Status of Service:  Completed, signed off  If discussed at [employer] of Stay Meetings, dates discussed:    Additional Comments:  Pollie Friar, RN 05/11/2016, 12:42 PM

## 2016-05-15 ENCOUNTER — Other Ambulatory Visit: Payer: Self-pay | Admitting: *Deleted

## 2016-05-15 ENCOUNTER — Other Ambulatory Visit: Payer: Self-pay | Admitting: Cardiology

## 2016-05-15 NOTE — Telephone Encounter (Signed)
Rx has been sent to the pharmacy electronically. ° °

## 2016-05-26 ENCOUNTER — Other Ambulatory Visit: Payer: Self-pay | Admitting: Family Medicine

## 2016-05-26 ENCOUNTER — Ambulatory Visit: Payer: Medicare Other

## 2016-05-26 DIAGNOSIS — E118 Type 2 diabetes mellitus with unspecified complications: Secondary | ICD-10-CM

## 2016-05-26 DIAGNOSIS — M5136 Other intervertebral disc degeneration, lumbar region: Secondary | ICD-10-CM

## 2016-05-27 NOTE — Progress Notes (Signed)
   05/10/16 1640  OT G-codes **NOT FOR INPATIENT CLASS**  Functional Assessment Tool Used clinical judgement  Functional Limitation Self care  Self Care Current Status [phone]) CI  Self Care Goal Status (I3128) CI  OT General Charges  $OT Visit 1 Procedure  OT Evaluation  $OT Eval Moderate Complexity 1 Procedure  OT Treatments  $Self Care/Home Management  8-22 mins   Nils Pyle, OTR/L Pager: [phone] 05/10/2016

## 2016-06-04 ENCOUNTER — Ambulatory Visit: Payer: Medicare Other | Attending: Internal Medicine | Admitting: Rehabilitative and Restorative Service Providers"

## 2016-06-04 DIAGNOSIS — M6281 Muscle weakness (generalized): Secondary | ICD-10-CM | POA: Diagnosis present

## 2016-06-04 DIAGNOSIS — R29818 Other symptoms and signs involving the nervous system: Secondary | ICD-10-CM

## 2016-06-04 DIAGNOSIS — R2689 Other abnormalities of gait and mobility: Secondary | ICD-10-CM | POA: Diagnosis present

## 2016-06-04 NOTE — Patient Instructions (Signed)
Functional Quadriceps: Sit to Stand    Sit on edge of chair, feet flat on floor. Stand upright, extending knees fully.  USE YOUR ARMS FOR SUPPORT Repeat _5___ times per set. Do __2__ sets per session. Do __2__ sessions per day.  http://orth.exer.us/735   Copyright  VHI. All rights reserved.

## 2016-06-05 NOTE — Therapy (Signed)
[facility] [address] [place], [place], [address] Phone: [phone]   Fax:  [phone]  Physical Therapy Evaluation  Patient Details  Name: [name] MRN: [id number] Date of Birth: Jan 29, 1941 Referring Provider: Marvel Plan, MD  Encounter Date: 06/04/2016      PT End of Session - 06/05/16 1335    Visit Number 1   Number of Visits 16   Date for PT Re-Evaluation 08/04/16   Authorization Type G code every 10th visit   PT Start Time 0850   PT Stop Time 0930   PT Time Calculation (min) 40 min   Equipment Utilized During Treatment Gait belt   Activity Tolerance Patient tolerated treatment well   Behavior During Therapy [facility] for tasks assessed/performed      Past Medical History:  Diagnosis Date  . Allergy   . Arthritis   . Cardiac arrhythmia    now has pacemaker  . Cataract   . Diverticulosis   . DM (diabetes mellitus) (HCC)   . Fibromyalgia   . GERD (gastroesophageal reflux disease)   . Hyperlipidemia   . Hypertension   . Internal and external hemorrhoids without complication   . Myocardial infarction (HCC)   . Potassium deficiency     Past Surgical History:  Procedure Laterality Date  . ABDOMINAL HYSTERECTOMY    . APPENDECTOMY    . bil carple tunnel surgery    . CARDIAC CATHETERIZATION N/A 04/09/2015   Procedure: Left Heart Cath and Coronary Angiography;  Surgeon: Peter M Swaziland, MD;  Location: [facility] INVASIVE CV LAB;  Service: Cardiovascular;  Laterality: N/A;  . COLONOSCOPY  multiple  . EP IMPLANTABLE DEVICE N/A 04/09/2016   Procedure: PPM Generator Changeout;  Surgeon: Marinus Maw, MD;  Location: [facility] INVASIVE CV LAB;  Service: Cardiovascular;  Laterality: N/A;  . KNEE SURGERY     right x 2  . NECK SURGERY     x2  . PACEMAKER INSERTION    . UPPER GASTROINTESTINAL ENDOSCOPY      There were no vitals filed for this visit.       Subjective Assessment - 06/04/16 0850    Subjective  The patient reports overall symptoms developed 2 years ago when she developed serotonin syndrome (reports jerking out of bed, fractured tail bone). She reports changing medications, which led to severe depression.  She reports she was in the bed for over a year.  Overall, condition is getting worse.  She presented to ED in July 2017 with R sided numbness, elevated BP, visual aura related to severe migraine.  The patient reports a problem with veering to the left side with walking and noted 1-2 weeks ago was pulled more to the right.  She furniture walks in the home and carries a cane when outdoors.    Pertinent History h/o stroke years ago per subjective, TIAs.   Patient Stated Goals Improving strength, be able to walk without falling/stumbling.    Currently in Pain? No/denies  gets pain when standing during household activities            [facility] PT Assessment - 06/04/16 0856      Assessment   Medical Diagnosis TIA and complicated migraine   Referring Provider Marvel Plan, MD   Onset Date/Surgical Date 05/09/16   Prior Therapy inpatient at [facility]     Precautions   Precautions Fall     Restrictions   Weight Bearing Restrictions No     Balance Screen  Has the patient fallen in the past 6 months Yes   How many times? 5-6   Has the patient had a decrease in activity level because of a fear of falling?  Yes   Is the patient reluctant to leave their home because of a fear of falling?  No     Home Environment   Living Environment Private residence   Living Arrangements Spouse/significant other   Type of Home House   Home Access Stairs to enter   Entrance Stairs-Number of Steps 1   Home Layout One level   Home Equipment Bootjack - single point;Walker - 2 wheels;Wheelchair - manual   Additional Comments daughter passed away 2 years ago     Observation/Other Assessments   Focus on Therapeutic Outcomes (FOTO)  36%     Sensation   Light Touch Impaired Detail   Light Touch Impaired  Details Impaired RLE;Impaired LLE  reports legs and toes   Additional Comments Reports serotonin syndrome made "me feel like my toes grew together".  Reports foot doctor reports mild neuropathy.     Coordination   Gross Motor Movements are Fluid and Coordinated Yes   Fine Motor Movements are Fluid and Coordinated Yes   Finger Nose Finger Test --  symmetrical bilaterally   Heel Shin Test somewhat impaired, slow--appears more due to strength than dyscoordination     Posture/Postural Control   Posture/Postural Control Postural limitations   Postural Limitations Rounded Shoulders;Forward head     ROM / Strength   AROM / PROM / Strength AROM;Strength     AROM   Overall AROM  Within functional limits for tasks performed     Strength   Overall Strength Deficits   Overall Strength Comments Bilateral shoulder flexion 5/5, L shoulder abduction 4/5, R shoulder abduction 4+/5, bilateral elbow flexion/extension 5/5, grip symmetric and strong.  Bilateral hip flexion 3/5, bilateral knee extension 4-/5, bilateral knee flexion 4-/5, bilateral ankle dorsiflexion 3/5.     Ambulation/Gait   Ambulation/Gait Yes   Ambulation/Gait Assistance 6: Modified independent (Device/Increase time)  slowed pace   Ambulation Distance (Feet) 200 Feet   Assistive device None   Gait Pattern --  variable performance with slowed speed    Ambulation Surface Level   Gait velocity 1.18 ft/sec   Stairs Yes   Stairs Assistance 5: Supervision   Stair Management Technique One rail Left;Alternating pattern   Number of Stairs 4   Gait Comments Patient's speed and step length vary greatly t/o gait activities with trunk flexion and hip tightness noted.  Patient maintains single leg/mid stance for prolonged period during gait cycle.     Standardized Balance Assessment   Standardized Balance Assessment Berg Balance Test;Timed Up and Go Test     Berg Balance Test   Sit to Stand Able to stand  independently using hands    Standing Unsupported Able to stand safely 2 minutes   Sitting with Back Unsupported but Feet Supported on Floor or Stool Able to sit safely and securely 2 minutes   Stand to Sit Controls descent by using hands   Transfers Able to transfer safely, definite need of hands   Standing Unsupported with Eyes Closed Able to stand 10 seconds with supervision   Standing Ubsupported with Feet Together Able to place feet together independently and stand for 1 minute with supervision   From Standing, Reach Forward with Outstretched Arm Can reach confidently >25 cm (10")   From Standing Position, Pick up Object from Floor Able to  pick up shoe, needs supervision   From Standing Position, Turn to Look Behind Over each Shoulder Turn sideways only but maintains balance   Turn 360 Degrees Needs close supervision or verbal cueing   Standing Unsupported, Alternately Place Feet on Step/Stool Able to complete 4 steps without aid or supervision   Standing Unsupported, One Foot in Front Able to take small step independently and hold 30 seconds   Standing on One Leg Tries to lift leg/unable to hold 3 seconds but remains standing independently   Total Score 38   Berg comment: 38/56 indicating high fall risk     Timed Up and Go Test   TUG --  35.17 seconds for TUG indicating high fall risk                           PT Education - 06/05/16 1334    Education provided Yes   Education Details HEP: sit<>stand x 10 reps   Person(s) Educated Patient   Methods Explanation;Demonstration;Handout   Comprehension Verbalized understanding;Returned demonstration          PT Short Term Goals - 06/05/16 1336      PT SHORT TERM GOAL #1   Title The patient will return demo HEP for LE strength, balance and gait.   Baseline Target date 07/05/2016   Time 4   Period Weeks     PT SHORT TERM GOAL #2   Title The paient will improve Berg balance score from 38/56 up to 44/56 to demo dec'ing risk for falls.    Baseline Target date 07/05/2016   Time 4   Period Weeks     PT SHORT TERM GOAL #3   Title The patient will improve gait speed from 1.18 ft/sec to > or equal to 1.8 ft/sec to demo dec'ing risk for falls.   Baseline Target date 07/05/2016   Time 4   Period Weeks     PT SHORT TERM GOAL #4   Title The patient will improve TUG score from 35.17 seconds to < or equal to 28 seconds to demo improved moiblity.   Baseline Target date 07/05/2016   Time 4   Period Weeks           PT Long Term Goals - 06/05/16 1339      PT LONG TERM GOAL #1   Title The patient will return demo HEP progression.   Baseline Target date 08/03/2016   Time 8   Period Weeks     PT LONG TERM GOAL #2   Title The patient will improve Berg from 38/56 to > or equal to 48/56 to demo dec'ing risk for falls.   Baseline Target date 08/03/2016   Time 8   Period Weeks     PT LONG TERM GOAL #3   Title The patient will improve gait speed from 1.18 ft/sec to > or equal to 2.0 ft/sec to demo improving functional mobility.   Baseline Target date 08/03/2016   Time 8   Period Weeks     PT LONG TERM GOAL #4   Title The patient will imrpove TUG from 35.17 seconds to < or equal to 22 seconds for improved functional mobility.   Baseline Target date 08/03/2016   Time 8   Period Weeks               Plan - 06/05/16 1340    Clinical Impression Statement The patient is a 75 year old female with  h/o decline in function since a family death and onset of muscle weakness, and gait instability.  PT to progress mobility in order to optimize current functional status.   Rehab Potential Good   PT Frequency 2x / week   PT Duration 8 weeks   PT Treatment/Interventions ADLs/Self Care Home Management;Therapeutic activities;Therapeutic exercise;Balance training;Neuromuscular re-education;Patient/family education;Stair training;Gait training;Functional mobility training   PT Next Visit Plan Check HEP, provide further balance/ Le  strengthening.  Progress gait and dynamic activities.   Consulted and Agree with Plan of Care Patient      Patient will benefit from skilled therapeutic intervention in order to improve the following deficits and impairments:  Abnormal gait, Decreased balance, Difficulty walking, Decreased mobility, Postural dysfunction, Decreased strength  Visit Diagnosis: Other abnormalities of gait and mobility  Other symptoms and signs involving the nervous system  Muscle weakness (generalized)     Problem List Patient Active Problem List   Diagnosis Date Noted  . Dyslipidemia 05/10/2016  . Elevated AST (SGOT) 05/10/2016  . Dizziness 05/10/2016  . Headache 05/10/2016  . Cephalalgia   . Complicated migraine   . Migraine with visual aura   . 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/09/2016  . Insomnia 12/10/2015  . Type 2 diabetes mellitus without complication, without long-term current use of insulin (HCC) 12/10/2015  . TIA (transient ischemic attack) 11/15/2015  . 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 Depression 07/23/2015  . Anxiety state 07/23/2015  . Abnormal nuclear stress test 04/09/2015  . Dyspnea 03/14/2015  . Chest pain 03/14/2015  . Edema leg 03/14/2015  . Essential hypertension 08/11/2014  . History of Serotonin syndrome w/ recurrent residual headaches 08/10/2014  . Balance disorder 08/10/2014  . B12 nutritional deficiency 08/10/2014  . Generalized muscle ache 11/29/2012  . GERD with stricture 12/14/2011  . BRADYCARDIA 11/18/2010  . CARDIAC PACEMAKER IN SITU 11/18/2010    Dequincy Born, PT 06/05/2016, 1:42 PM  [facility] [address] [place], [place], [address] Phone: [phone]   Fax:  [phone]  Name: Kateria Cutrona MRN: [id number] Date of Birth: [birth year]

## 2016-06-08 ENCOUNTER — Ambulatory Visit: Payer: Medicare Other | Admitting: Physical Therapy

## 2016-06-08 DIAGNOSIS — M6281 Muscle weakness (generalized): Secondary | ICD-10-CM

## 2016-06-08 DIAGNOSIS — R2689 Other abnormalities of gait and mobility: Secondary | ICD-10-CM | POA: Diagnosis not present

## 2016-06-08 DIAGNOSIS — R29818 Other symptoms and signs involving the nervous system: Secondary | ICD-10-CM

## 2016-06-08 NOTE — Therapy (Signed)
[facility] [address] [place], [place], [address] Phone: [phone]   Fax:  [phone]  Physical Therapy Treatment  Patient Details  Name: [name] MRN: [id number] Date of Birth: 05/14/1941 Referring Provider: Marvel Plan, MD  Encounter Date: 06/08/2016      PT End of Session - 06/08/16 1341    Visit Number 2   Number of Visits 16   Date for PT Re-Evaluation 08/04/16   Authorization Type G code every 10th visit   PT Start Time 1234   PT Stop Time 1312   PT Time Calculation (min) 38 min   Activity Tolerance Patient tolerated treatment well   Behavior During Therapy [facility] for tasks assessed/performed      Past Medical History:  Diagnosis Date  . Allergy   . Arthritis   . Cardiac arrhythmia    now has pacemaker  . Cataract   . Diverticulosis   . DM (diabetes mellitus) (HCC)   . Fibromyalgia   . GERD (gastroesophageal reflux disease)   . Hyperlipidemia   . Hypertension   . Internal and external hemorrhoids without complication   . Myocardial infarction (HCC)   . Potassium deficiency     Past Surgical History:  Procedure Laterality Date  . ABDOMINAL HYSTERECTOMY    . APPENDECTOMY    . bil carple tunnel surgery    . CARDIAC CATHETERIZATION N/A 04/09/2015   Procedure: Left Heart Cath and Coronary Angiography;  Surgeon: Peter M Swaziland, MD;  Location: [facility] INVASIVE CV LAB;  Service: Cardiovascular;  Laterality: N/A;  . COLONOSCOPY  multiple  . EP IMPLANTABLE DEVICE N/A 04/09/2016   Procedure: PPM Generator Changeout;  Surgeon: Marinus Maw, MD;  Location: [facility] INVASIVE CV LAB;  Service: Cardiovascular;  Laterality: N/A;  . KNEE SURGERY     right x 2  . NECK SURGERY     x2  . PACEMAKER INSERTION    . UPPER GASTROINTESTINAL ENDOSCOPY      There were no vitals filed for this visit.      Subjective Assessment - 06/08/16 1235    Subjective Attempted HEP since last visit. Walks in the home  exercise about 3 min 3-4x/day. Pt has bilateral AFOs but they don't fit in shoe. Planning to go back to podiatrist and discuss shoe fit.   Pertinent History h/o stroke years ago per subjective, TIAs.   Patient Stated Goals Improving strength, be able to walk without falling/stumbling.    Currently in Pain? No/denies; had mild left hip pain with LE strengthening exercises.                              Balance Exercises - 06/08/16 1245      OTAGO PROGRAM   Head Movements Standing   Neck Movements Standing   Back Extension Standing   Trunk Movements Standing   Ankle Movements Sitting   Knee Extensor 10 reps   Knee Flexor 10 reps   Hip ABductor 10 reps   Ankle Plantorflexors 20 reps, support   Ankle Dorsiflexors 20 reps, support           PT Education - 06/08/16 1259    Education provided Yes   Education Details Initiated Ortago HEP for LE strengthening    Person(s) Educated Patient   Methods Explanation;Demonstration;Handout;Verbal cues   Comprehension Verbalized understanding;Returned demonstration;Verbal cues required;Need further instruction          PT Short  Term Goals - 06/05/16 1336      PT SHORT TERM GOAL #1   Title The patient will return demo HEP for LE strength, balance and gait.   Baseline Target date 07/05/2016   Time 4   Period Weeks     PT SHORT TERM GOAL #2   Title The paient will improve Berg balance score from 38/56 up to 44/56 to demo dec'ing risk for falls.   Baseline Target date 07/05/2016   Time 4   Period Weeks     PT SHORT TERM GOAL #3   Title The patient will improve gait speed from 1.18 ft/sec to > or equal to 1.8 ft/sec to demo dec'ing risk for falls.   Baseline Target date 07/05/2016   Time 4   Period Weeks     PT SHORT TERM GOAL #4   Title The patient will improve TUG score from 35.17 seconds to < or equal to 28 seconds to demo improved moiblity.   Baseline Target date 07/05/2016   Time 4   Period Weeks            PT Long Term Goals - 06/05/16 1339      PT LONG TERM GOAL #1   Title The patient will return demo HEP progression.   Baseline Target date 08/03/2016   Time 8   Period Weeks     PT LONG TERM GOAL #2   Title The patient will improve Berg from 38/56 to > or equal to 48/56 to demo dec'ing risk for falls.   Baseline Target date 08/03/2016   Time 8   Period Weeks     PT LONG TERM GOAL #3   Title The patient will improve gait speed from 1.18 ft/sec to > or equal to 2.0 ft/sec to demo improving functional mobility.   Baseline Target date 08/03/2016   Time 8   Period Weeks     PT LONG TERM GOAL #4   Title The patient will imrpove TUG from 35.17 seconds to < or equal to 22 seconds for improved functional mobility.   Baseline Target date 08/03/2016   Time 8   Period Weeks               Plan - 06/08/16 1306    Clinical Impression Statement Initiated Ortago HEP for LE strengthening; requires min cues for technique but tolerated well.  Pt noted mild left hip pain with LE strengthening.   Rehab Potential Good   PT Frequency 2x / week   PT Duration 8 weeks   PT Treatment/Interventions ADLs/Self Care Home Management;Therapeutic activities;Therapeutic exercise;Balance training;Neuromuscular re-education;Patient/family education;Stair training;Gait training;Functional mobility training   PT Next Visit Plan Check Ortago and add balance exercises; Progress gait and dynamic activities.      Patient will benefit from skilled therapeutic intervention in order to improve the following deficits and impairments:  Abnormal gait, Decreased balance, Difficulty walking, Decreased mobility, Postural dysfunction, Decreased strength  Visit Diagnosis: Other abnormalities of gait and mobility  Other symptoms and signs involving the nervous system  Muscle weakness (generalized)     Problem List Patient Active Problem List   Diagnosis Date Noted  . Dyslipidemia 05/10/2016  . Elevated AST (SGOT)  05/10/2016  . Dizziness 05/10/2016  . Headache 05/10/2016  . Cephalalgia   . Complicated migraine   . Migraine with visual aura   . 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/09/2016  . Insomnia 12/10/2015  . Type 2 diabetes mellitus without complication, without long-term current  use of insulin (HCC) 12/10/2015  . TIA (transient ischemic attack) 11/15/2015  . 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 Depression 07/23/2015  . Anxiety state 07/23/2015  . Abnormal nuclear stress test 04/09/2015  . Dyspnea 03/14/2015  . Chest pain 03/14/2015  . Edema leg 03/14/2015  . Essential hypertension 08/11/2014  . History of Serotonin syndrome w/ recurrent residual headaches 08/10/2014  . Balance disorder 08/10/2014  . B12 nutritional deficiency 08/10/2014  . Generalized muscle ache 11/29/2012  . GERD with stricture 12/14/2011  . BRADYCARDIA 11/18/2010  . CARDIAC PACEMAKER IN SITU 11/18/2010    Hortencia Conradi, PTA  06/08/16, 1:46 PM  [facility] [address] [place], [place], [address] Phone: [phone]   Fax:  [phone]  Name: [name] MRN: [id number] Date of Birth: 19-Nov-1940

## 2016-06-11 ENCOUNTER — Ambulatory Visit: Payer: Medicare Other | Admitting: Physical Therapy

## 2016-06-11 DIAGNOSIS — R2689 Other abnormalities of gait and mobility: Secondary | ICD-10-CM | POA: Diagnosis not present

## 2016-06-11 DIAGNOSIS — M6281 Muscle weakness (generalized): Secondary | ICD-10-CM

## 2016-06-11 DIAGNOSIS — R29818 Other symptoms and signs involving the nervous system: Secondary | ICD-10-CM

## 2016-06-11 NOTE — Patient Instructions (Signed)
Completed remainder of OTAGO booklet-See todays PN for details

## 2016-06-11 NOTE — Therapy (Signed)
[facility] [address] [place], [place], [address] Phone: [phone]   Fax:  [phone]  Physical Therapy Treatment  Patient Details  Name: [name] MRN: [id number] Date of Birth: 06-28-1941 Referring Provider: Marvel PlanJindong Xu, MD  Encounter Date: 06/11/2016      PT End of Session - 06/11/16 1535    Visit Number 3   Number of Visits 16   Date for PT Re-Evaluation 08/04/16   Authorization Type G code every 10th visit   PT Start Time 1320   PT Stop Time 1402   PT Time Calculation (min) 42 min   Activity Tolerance Patient tolerated treatment well   Behavior During Therapy [facility] for tasks assessed/performed      Past Medical History:  Diagnosis Date  . Allergy   . Arthritis   . Cardiac arrhythmia    now has pacemaker  . Cataract   . Diverticulosis   . DM (diabetes mellitus) (HCC)   . Fibromyalgia   . GERD (gastroesophageal reflux disease)   . Hyperlipidemia   . Hypertension   . Internal and external hemorrhoids without complication   . Myocardial infarction (HCC)   . Potassium deficiency     Past Surgical History:  Procedure Laterality Date  . ABDOMINAL HYSTERECTOMY    . APPENDECTOMY    . bil carple tunnel surgery    . CARDIAC CATHETERIZATION N/A 04/09/2015   Procedure: Left Heart Cath and Coronary Angiography;  Surgeon: Peter M SwazilandJordan, MD;  [facility];  Service: Cardiovascular;  Laterality: N/A;  . COLONOSCOPY  multiple  . EP IMPLANTABLE DEVICE N/A 04/09/2016   Procedure: PPM Generator Changeout;  Surgeon: Marinus MawGregg W Taylor, MD;  [facility];  Service: Cardiovascular;  Laterality: N/A;  . KNEE SURGERY     right x 2  . NECK SURGERY     x2  . PACEMAKER INSERTION    . UPPER GASTROINTESTINAL ENDOSCOPY      There were no vitals filed for this visit.      Subjective Assessment - 06/11/16 1325    Subjective Was able to try OTAGO exercises and it went well.   Denies falls or changes.   Pertinent History h/o stroke years ago per subjective, TIAs.   Patient Stated Goals Improving strength, be able to walk without falling/stumbling.    Currently in Pain? No/denies           Balance Exercises - 06/11/16 1532      OTAGO PROGRAM   Head Movements Standing;5 reps   Neck Movements Standing;5 reps   Back Extension Standing;5 reps   Trunk Movements Standing;5 reps   Ankle Movements Sitting;10 reps   Knee Extensor 10 reps   Knee Flexor 10 reps;Weight (comment)  2   Hip ABductor 10 reps;Weight (comment)  2   Ankle Plantorflexors 20 reps, support   Ankle Dorsiflexors 20 reps, support   Knee Bends 10 reps, support   Backwards Walking Support   Sideways Walking Assistive device  counter support   Tandem Stance 10 seconds, support   Tandem Walk Support   One Leg Stand 10 seconds, support   Sit to Stand 10 reps, bilateral support  need to review before adding to HEP as pt had difficulty   Overall OTAGO Comments pt had difficulty at end of session with sit<>stand and unable with decreased ability to process difficulty.           PT Education - 06/11/16 1532  Education provided Yes   Education Details OTAGO, using UE support for balance and only doing exercises marked in booklet   Person(s) Educated Patient   Methods Explanation;Demonstration;Tactile cues;Verbal cues;Handout   Comprehension Verbalized understanding;Returned demonstration          PT Short Term Goals - 06/05/16 1336      PT SHORT TERM GOAL #1   Title The patient will return demo HEP for LE strength, balance and gait.   Baseline Target date 07/05/2016   Time 4   Period Weeks     PT SHORT TERM GOAL #2   Title The paient will improve Berg balance score from 38/56 up to 44/56 to demo dec'ing risk for falls.   Baseline Target date 07/05/2016   Time 4   Period Weeks     PT SHORT TERM GOAL #3   Title The patient will improve gait speed from 1.18 ft/sec to > or equal  to 1.8 ft/sec to demo dec'ing risk for falls.   Baseline Target date 07/05/2016   Time 4   Period Weeks     PT SHORT TERM GOAL #4   Title The patient will improve TUG score from 35.17 seconds to < or equal to 28 seconds to demo improved moiblity.   Baseline Target date 07/05/2016   Time 4   Period Weeks           PT Long Term Goals - 06/05/16 1339      PT LONG TERM GOAL #1   Title The patient will return demo HEP progression.   Baseline Target date 08/03/2016   Time 8   Period Weeks     PT LONG TERM GOAL #2   Title The patient will improve Berg from 38/56 to > or equal to 48/56 to demo dec'ing risk for falls.   Baseline Target date 08/03/2016   Time 8   Period Weeks     PT LONG TERM GOAL #3   Title The patient will improve gait speed from 1.18 ft/sec to > or equal to 2.0 ft/sec to demo improving functional mobility.   Baseline Target date 08/03/2016   Time 8   Period Weeks     PT LONG TERM GOAL #4   Title The patient will imrpove TUG from 35.17 seconds to < or equal to 22 seconds for improved functional mobility.   Baseline Target date 08/03/2016   Time 8   Period Weeks             Patient will benefit from skilled therapeutic intervention in order to improve the following deficits and impairments:     Visit Diagnosis: Other abnormalities of gait and mobility  Other symptoms and signs involving the nervous system  Muscle weakness (generalized)     Problem List Patient Active Problem List   Diagnosis Date Noted  . Dyslipidemia 05/10/2016  . Elevated AST (SGOT) 05/10/2016  . Dizziness 05/10/2016  . Headache 05/10/2016  . Cephalalgia   . Complicated migraine   . Migraine with visual aura   . 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/09/2016  . Insomnia 12/10/2015  . Type 2 diabetes mellitus without complication, without long-term current use of insulin (HCC) 12/10/2015  . TIA (transient ischemic attack) 11/15/2015  . Type 2 diabetes mellitus with diabetic  polyneuropathy (HCC) 07/24/2015  . Depression 07/23/2015  . Anxiety state 07/23/2015  . Abnormal nuclear stress test 04/09/2015  . Dyspnea 03/14/2015  . Chest pain 03/14/2015  . Edema leg 03/14/2015  .  Essential hypertension 08/11/2014  . History of Serotonin syndrome w/ recurrent residual headaches 08/10/2014  . Balance disorder 08/10/2014  . B12 nutritional deficiency 08/10/2014  . Generalized muscle ache 11/29/2012  . GERD with stricture 12/14/2011  . BRADYCARDIA 11/18/2010  . CARDIAC PACEMAKER IN SITU 11/18/2010    [name] 06/11/2016, 3:36 PM  [facility] [address] [place], [place], [address] Phone: [phone]   Fax:  [phone]  Name: [name] MRN: [id number] Date of Birth: 10-Mar-1941   [name], [place] [facility] 06/11/16 3:36 PM Phone: [phone] Fax: [phone]

## 2016-06-15 ENCOUNTER — Ambulatory Visit: Payer: Medicare Other | Admitting: Physical Therapy

## 2016-06-17 ENCOUNTER — Ambulatory Visit: Payer: Medicare Other | Admitting: Physical Therapy

## 2016-06-17 DIAGNOSIS — R2689 Other abnormalities of gait and mobility: Secondary | ICD-10-CM

## 2016-06-17 DIAGNOSIS — M6281 Muscle weakness (generalized): Secondary | ICD-10-CM

## 2016-06-17 DIAGNOSIS — R29818 Other symptoms and signs involving the nervous system: Secondary | ICD-10-CM

## 2016-06-17 NOTE — Therapy (Signed)
[facility] [address] [place], [place], [address] Phone: [phone]   Fax:  [phone]  Physical Therapy Treatment  Patient Details  Name: [name] MRN: [id number] Date of Birth: 1941-03-06 Referring Provider: Marvel PlanJindong Xu, MD  Encounter Date: 06/17/2016      PT End of Session - 06/17/16 1237    Visit Number 4   Number of Visits 16   Date for PT Re-Evaluation 08/04/16   Authorization Type G code every 10th visit   PT Start Time 0848   PT Stop Time 0930   PT Time Calculation (min) 42 min   Equipment Utilized During Treatment Gait belt   Activity Tolerance Patient tolerated treatment well   Behavior During Therapy [facility] for tasks assessed/performed      Past Medical History:  Diagnosis Date  . Allergy   . Arthritis   . Cardiac arrhythmia    now has pacemaker  . Cataract   . Diverticulosis   . DM (diabetes mellitus) (HCC)   . Fibromyalgia   . GERD (gastroesophageal reflux disease)   . Hyperlipidemia   . Hypertension   . Internal and external hemorrhoids without complication   . Myocardial infarction (HCC)   . Potassium deficiency     Past Surgical History:  Procedure Laterality Date  . ABDOMINAL HYSTERECTOMY    . APPENDECTOMY    . bil carple tunnel surgery    . CARDIAC CATHETERIZATION N/A 04/09/2015   Procedure: Left Heart Cath and Coronary Angiography;  Surgeon: Peter M SwazilandJordan, MD;  [facility];  Service: Cardiovascular;  Laterality: N/A;  . COLONOSCOPY  multiple  . EP IMPLANTABLE DEVICE N/A 04/09/2016   Procedure: PPM Generator Changeout;  Surgeon: Marinus MawGregg W Taylor, MD;  [facility];  Service: Cardiovascular;  Laterality: N/A;  . KNEE SURGERY     right x 2  . NECK SURGERY     x2  . PACEMAKER INSERTION    . UPPER GASTROINTESTINAL ENDOSCOPY      There were no vitals filed for this visit.      Subjective Assessment - 06/17/16 0850    Subjective  Was able to perform OTAGO exercises and it went well.  Denies falls or changes.   Pertinent History h/o stroke years ago per subjective, TIAs.   Patient Stated Goals Improving strength, be able to walk without falling/stumbling.    Currently in Pain? Yes   Pain Score 2   Pain Location Back   Pain Orientation Lower   Pain Descriptors / Indicators Sore   Pain Type Acute pain   Pain Onset Today   Aggravating Factors  After bridging exercise   Pain Relieving Factors rest                         [facility] Adult PT Treatment/Exercise - 06/17/16 0001      Ambulation/Gait   Ambulation/Gait Yes   Ambulation/Gait Assistance 4: Min guard   Ambulation Distance (Feet) 400 Feet   Assistive device None   Gait Pattern Step-through pattern;Decreased arm swing - right;Decreased arm swing - left;Decreased trunk rotation;Poor foot clearance - left;Poor foot clearance - right   Ambulation Surface Level   Gait Comments working on balance with changes in speed, direction and heads turns             Balance Exercises - 06/17/16 0858      Balance Exercises: Standing   Gait with Head  Turns Forward;5 reps  Min guard   Retro Gait 5 reps  Min guard   Sidestepping 5 reps  min guard   Step Over Hurdles / Cones ALT toe tapping cones working on [employer] and negotiating obstacles  Min A   Sit to Stand Time 5x2 without UE support, min guard to supervision  cues for posture and body mechanics           PT Education - 06/17/16 [id number]    Education provided Yes   Education Details Practiced mechanics for sit<>stands without UE support and added to HEP   Person(s) Educated Patient   Methods Explanation;Demonstration;Tactile cues;Verbal cues;Handout   Comprehension Verbalized understanding;Returned demonstration;Verbal cues required;Tactile cues required;Need further instruction          PT Short Term Goals - 06/05/16 1336      PT SHORT TERM GOAL #1   Title The patient will return demo  HEP for LE strength, balance and gait.   Baseline Target date 07/05/2016   Time 4   Period Weeks     PT SHORT TERM GOAL #2   Title The paient will improve Berg balance score from 38/56 up to 44/56 to demo dec'ing risk for falls.   Baseline Target date 07/05/2016   Time 4   Period Weeks     PT SHORT TERM GOAL #3   Title The patient will improve gait speed from 1.18 ft/sec to > or equal to 1.8 ft/sec to demo dec'ing risk for falls.   Baseline Target date 07/05/2016   Time 4   Period Weeks     PT SHORT TERM GOAL #4   Title The patient will improve TUG score from 35.17 seconds to < or equal to 28 seconds to demo improved moiblity.   Baseline Target date 07/05/2016   Time 4   Period Weeks           PT Long Term Goals - 06/05/16 1339      PT LONG TERM GOAL #1   Title The patient will return demo HEP progression.   Baseline Target date 08/03/2016   Time 8   Period Weeks     PT LONG TERM GOAL #2   Title The patient will improve Berg from 38/56 to > or equal to 48/56 to demo dec'ing risk for falls.   Baseline Target date 08/03/2016   Time 8   Period Weeks     PT LONG TERM GOAL #3   Title The patient will improve gait speed from 1.18 ft/sec to > or equal to 2.0 ft/sec to demo improving functional mobility.   Baseline Target date 08/03/2016   Time 8   Period Weeks     PT LONG TERM GOAL #4   Title The patient will imrpove TUG from 35.17 seconds to < or equal to 22 seconds for improved functional mobility.   Baseline Target date 08/03/2016   Time 8   Period Weeks               Plan - 06/17/16 9604    Clinical Impression Statement Pt requires min A with SLS stance and activities with narrow BOS.  Pt demonstrates weakness in hips which was addressed with strengthening exercise; mild low back soreness with bridging exercise.   Rehab Potential Good   PT Frequency 2x / week   PT Duration 8 weeks   PT Treatment/Interventions ADLs/Self Care Home Management;Therapeutic  activities;Therapeutic exercise;Balance training;Neuromuscular re-education;Patient/family education;Stair training;Gait training;Functional mobility training   PT Next  Visit Plan Sidelying clamshells and other hip strengthening exercises (be careful of h/o low back pain) ; Check Ortago and add balance exercises; Progress gait and dynamic activities.      Patient will benefit from skilled therapeutic intervention in order to improve the following deficits and impairments:  Abnormal gait, Decreased balance, Difficulty walking, Decreased mobility, Postural dysfunction, Decreased strength  Visit Diagnosis: Other abnormalities of gait and mobility  Other symptoms and signs involving the nervous system  Muscle weakness (generalized)     Problem List Patient Active Problem List   Diagnosis Date Noted  . Dyslipidemia 05/10/2016  . Elevated AST (SGOT) 05/10/2016  . Dizziness 05/10/2016  . Headache 05/10/2016  . Cephalalgia   . Complicated migraine   . Migraine with visual aura   . 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/09/2016  . Insomnia 12/10/2015  . Type 2 diabetes mellitus without complication, without long-term current use of insulin (HCC) 12/10/2015  . TIA (transient ischemic attack) 11/15/2015  . 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 Depression 07/23/2015  . Anxiety state 07/23/2015  . Abnormal nuclear stress test 04/09/2015  . Dyspnea 03/14/2015  . Chest pain 03/14/2015  . Edema leg 03/14/2015  . Essential hypertension 08/11/2014  . History of Serotonin syndrome w/ recurrent residual headaches 08/10/2014  . Balance disorder 08/10/2014  . B12 nutritional deficiency 08/10/2014  . Generalized muscle ache 11/29/2012  . GERD with stricture 12/14/2011  . BRADYCARDIA 11/18/2010  . CARDIAC PACEMAKER IN SITU 11/18/2010    Hortencia ConradiKarissa Sascha Palma, PTA  06/17/16, 12:45 PM   [facility] [address]  [address] [place], [place], [address] Phone: [phone]   Fax:  [phone]  Name: [name] MRN: [id number] Date of Birth: 1941-06-16

## 2016-06-24 ENCOUNTER — Ambulatory Visit: Payer: Medicare Other | Admitting: Physical Therapy

## 2016-06-29 ENCOUNTER — Encounter: Payer: Self-pay | Admitting: Internal Medicine

## 2016-06-29 ENCOUNTER — Ambulatory Visit: Payer: Medicare Other | Admitting: Rehabilitative and Restorative Service Providers"

## 2016-07-01 ENCOUNTER — Ambulatory Visit: Payer: Medicare Other | Admitting: Physical Therapy

## 2016-07-08 ENCOUNTER — Encounter: Payer: Medicare Other | Admitting: Rehabilitative and Restorative Service Providers"

## 2016-07-10 ENCOUNTER — Encounter: Payer: Medicare Other | Admitting: Rehabilitative and Restorative Service Providers"

## 2016-07-13 ENCOUNTER — Encounter: Payer: Medicare Other | Admitting: Internal Medicine

## 2016-07-14 ENCOUNTER — Telehealth: Payer: Self-pay | Admitting: Neurology

## 2016-07-14 NOTE — Telephone Encounter (Signed)
I saw her 05/10/16 at hospital for complicated migraine with aura. I did not remember I was mad and left, probably I was not happy that she was told by someone else that she had a stroke at that time. Or, she just messed up me with someone else. Anyway, I am fine with her cancelling the visit. She can be seen as needed. Thanks.  Marvel PlanJindong Teruko Joswick, MD PhD Stroke Neurology 07/14/2016 6:50 PM

## 2016-07-14 NOTE — Telephone Encounter (Signed)
Patient called to cancel September 19th [facility] f/u appointment w/Dr. [name], "has something else this day, doesn't understand why she has this appointment, they called him in at the hospital, he came over, he was really mad, he didn't do anything, he got mad and left. I'll call back if I need him."

## 2016-07-14 NOTE — Telephone Encounter (Signed)
Message sent to Dr. Xu. FYI. 

## 2016-07-17 ENCOUNTER — Ambulatory Visit (INDEPENDENT_AMBULATORY_CARE_PROVIDER_SITE_OTHER): Payer: Medicare Other | Admitting: Internal Medicine

## 2016-07-17 ENCOUNTER — Encounter: Payer: Self-pay | Admitting: Internal Medicine

## 2016-07-17 VITALS — BP 126/76 | HR 84 | Ht 66.0 in | Wt 168.0 lb

## 2016-07-17 DIAGNOSIS — I10 Essential (primary) hypertension: Secondary | ICD-10-CM

## 2016-07-17 DIAGNOSIS — I639 Cerebral infarction, unspecified: Secondary | ICD-10-CM

## 2016-07-17 DIAGNOSIS — I495 Sick sinus syndrome: Secondary | ICD-10-CM

## 2016-07-17 LAB — CUP PACEART INCLINIC DEVICE CHECK
Battery Remaining Longevity: 129 mo
Battery Voltage: 3.06 V
Brady Statistic AP VP Percent: 0.1 %
Brady Statistic AP VS Percent: 98.89 %
Brady Statistic AS VP Percent: 0.01 %
Brady Statistic AS VS Percent: 1 %
Brady Statistic RA Percent Paced: 98.99 %
Brady Statistic RV Percent Paced: 0.11 %
Date Time Interrogation Session: 20170915092237
Implantable Lead Implant Date: 20070910
Implantable Lead Implant Date: 20070910
Implantable Lead Location: 753859
Implantable Lead Location: 753860
Implantable Lead Model: 5076
Implantable Lead Model: 5076
Lead Channel Impedance Value: 323 Ohm
Lead Channel Impedance Value: 399 Ohm
Lead Channel Impedance Value: 418 Ohm
Lead Channel Impedance Value: 456 Ohm
Lead Channel Pacing Threshold Amplitude: 0.75 V
Lead Channel Pacing Threshold Amplitude: 0.875 V
Lead Channel Pacing Threshold Pulse Width: 0.4 ms
Lead Channel Pacing Threshold Pulse Width: 0.4 ms
Lead Channel Sensing Intrinsic Amplitude: 16.625 mV
Lead Channel Setting Pacing Amplitude: 2 V
Lead Channel Setting Pacing Amplitude: 2.5 V
Lead Channel Setting Pacing Pulse Width: 0.4 ms
Lead Channel Setting Sensing Sensitivity: 2.8 mV

## 2016-07-17 NOTE — Patient Instructions (Addendum)
Medication Instructions:  Your physician recommends that you continue on your current medications as directed. Please refer to the Current Medication list given to you today.   Labwork: none  Testing/Procedures: none  Follow-Up: Remote monitoring is used to monitor your Pacemaker or ICD from home. This monitoring reduces the number of office visits required to check your device to one time per year. It allows us to keep an eye on the functioning of your device to ensure it is working properly. You are scheduled for a device check from home on 12/18. You may send your transmission at any time that day. If you have a wireless device, the transmission will be sent automatically. After your physician reviews your transmission, you will receive a postcard with your next transmission date.  Your physician wants you to follow-up in: 9 months with Dr. Ladona Ridgelaylor. You will receive a reminder letter in the mail two months in advance. If you don't receive a letter, please call our office to schedule the follow-up appointment.    Any Other Special Instructions Will Be Listed Below (If Applicable).     If you need a refill on your cardiac medications before your next appointment, please call your pharmacy.

## 2016-07-17 NOTE — Progress Notes (Signed)
HPI [name] returns today for followup. She is a very pleasant 75 year old woman with hypertension and sinus node dysfunction, status post permanent pacemaker insertion. She underwent PPM gen change about 3 months ago. In the interim, she feels better but admits to a sedentary lifestyle. No chest pain or sob. No edema. No syncope. Allergies  Allergen Reactions  . Citalopram     Other reaction(s): Serum Sickness (ALLERGY) Serotonin Syndrome  . Codeine Other (See Comments)    hallucinations  . Cymbalta [Duloxetine Hcl] Other (See Comments)    Serotonin Syndrome  . Metformin And Related     Causes dehydration and causes me to go to the hospital  . Paroxetine Hcl     Serotonin Syndrome  . Trazodone And Nefazodone Other (See Comments)    Serotonin Syndrome  . Caffeine Anxiety    Nervousness      Current Outpatient Prescriptions  Medication Sig Dispense Refill  . amLODipine (NORVASC) 2.5 MG tablet TAKE 1 TABLET BY MOUTH EVERY DAY AS DIRECTED 90 tablet 1  . atorvastatin (LIPITOR) 80 MG tablet Take 1 tablet (80 mg total) by mouth every evening. 30 tablet 1  . buPROPion (WELLBUTRIN XL) 300 MG 24 hr tablet Take 300 mg by mouth daily before breakfast.    . clonazePAM (KLONOPIN) 0.5 MG tablet Take 0.5-1 mg by mouth 2 (two) times daily. 0.5 mg at lunchtime daily and 1 mg at night    . clopidogrel (PLAVIX) 75 MG tablet Take 1 tablet (75 mg total) by mouth daily. 30 tablet 11  . Coenzyme Q10 200 MG capsule Take 200 mg by mouth daily with lunch.    . cyanocobalamin 500 MCG tablet Take 1,000 mcg by mouth daily with lunch. Vitamin B12    . doxepin (SINEQUAN) 10 MG capsule Take 1 capsule by mouth at bedtime.    . Eszopiclone 3 MG TABS Take 1 tablet by mouth at bedtime.    . gabapentin (NEURONTIN) 100 MG capsule TAKE 1 CAPSULE BY MOUTH THREE TIMES DAILY 270 capsule 0  . glipiZIDE (GLUCOTROL) 5 MG tablet Take 1 tablet (5 mg total) by mouth daily before breakfast. 30 tablet 1  . glucose blood  (ONE TOUCH ULTRA TEST) test strip AS DIRECTED TWICE DAILY    . hydrochlorothiazide (HYDRODIURIL) 25 MG tablet Take 25 mg by mouth daily.    Marland Kitchen losartan (COZAAR) 100 MG tablet Take 1 tablet (100 mg total) by mouth daily. 90 tablet 3  . Multiple Vitamins-Minerals (ANTIOXIDANT PO) Take 1 tablet by mouth daily with lunch.    . Omega-3 Fatty Acids (FISH OIL PO) Take 2 capsules by mouth daily with lunch.    Marland Kitchen OVER THE COUNTER MEDICATION Take 2 tablets by mouth daily with lunch. Circulation and Vein Support by Feliberto Harts    . pantoprazole (PROTONIX) 40 MG tablet Take 1 tablet (40 mg total) by mouth 2 (two) times daily before a meal. 60 tablet 2  . temazepam (RESTORIL) 30 MG capsule Take 30 mg by mouth at bedtime.  2   No current facility-administered medications for this visit.      Past Medical History:  Diagnosis Date  . Allergy   . Arthritis   . Cardiac arrhythmia    now has pacemaker  . Cataract   . Diverticulosis   . DM (diabetes mellitus) (HCC)   . Fibromyalgia   . GERD (gastroesophageal reflux disease)   . Hyperlipidemia   . Hypertension   . Internal and external hemorrhoids without complication   .  Myocardial infarction (HCC)   . Potassium deficiency     ROS:   All systems reviewed and negative except as noted in the HPI.   Past Surgical History:  Procedure Laterality Date  . ABDOMINAL HYSTERECTOMY    . APPENDECTOMY    . bil carple tunnel surgery    . CARDIAC CATHETERIZATION N/A 04/09/2015   Procedure: Left Heart Cath and Coronary Angiography;  Surgeon: Peter M Swaziland, MD;  [facility] CV LAB;  Service: Cardiovascular;  Laterality: N/A;  . COLONOSCOPY  multiple  . EP IMPLANTABLE DEVICE N/A 04/09/2016   Procedure: PPM Generator Changeout;  Surgeon: Marinus Maw, MD;  [facility] CV LAB;  Service: Cardiovascular;  Laterality: N/A;  . KNEE SURGERY     right x 2  . NECK SURGERY     x2  . PACEMAKER INSERTION    . UPPER GASTROINTESTINAL ENDOSCOPY        Family History  Problem Relation Age of Onset  . Coronary artery disease Father   . Prostate cancer Father   . Colon cancer Father   . Lung cancer Father   . Diabetes Father   . Heart attack Father   . Diabetes Daughter   . Dementia Daughter   . Colon cancer Sister     x 2  . Lung cancer Mother   . Dementia Mother   . Colon polyps Sister   . Crohn's disease Sister   . Diabetes Maternal Aunt     multiple  . Breast cancer Maternal Aunt   . Colon cancer Brother   . Kidney disease Daughter   . Diabetes Cousin     multiple  . Diabetes Maternal Uncle     multiple  . Irritable bowel syndrome Sister   . Heart attack Daughter   . Heart attack Brother   . Hypertension Neg Hx   . Esophageal cancer Neg Hx   . Rectal cancer Neg Hx   . Stomach cancer Neg Hx      Social History   Social History  . Marital status: Married    Spouse name: N/A  . Number of children: 2  . Years of education: 12   Occupational History  . Retired Retired   Social History Main Topics  . Smoking status: Never Smoker  . Smokeless tobacco: Never Used  . Alcohol use No  . Drug use: No  . Sexual activity: Not on file   Other Topics Concern  . Not on file   Social History Narrative   Patient is married with 2 children.   Patient is right handed.   Patient has 12 th grade education.   Patient does not drink caffeine.     BP 126/76   Pulse 84   Ht 5\' 6"  (1.676 m)   Wt 168 lb (76.2 kg)   BMI 27.12 kg/m   Physical Exam:  Well appearing 75 year old woman, NAD HEENT: Unremarkable Neck:  6 cm JVD, no thyromegally Lungs:  Clear with no wheezes, rales, or rhonchi. HEART:  Regular rate rhythm, no murmurs, no rubs, no clicks Abd:  soft, positive bowel sounds, no organomegally, no rebound, no guarding Ext:  2 plus pulses, no edema, no cyanosis, no clubbing Skin:  No rashes no nodules Neuro:  CN II through XII intact, motor grossly intact  DEVICE  Normal device function.  See  PaceArt for details.   Assess/Plan:  1. Sinus node dysfunction - she is doing well s/p PPM insertion. 2. PPM -  her Medtronic device is working normally. Will recheck in several months. 3. HTN - her blood pressure is well controlled today on medical therapy, no changes.  4. CAD - she denies anginal symptoms. I have asked her to start walking 30 minutes a day.  [name],M.D.

## 2016-07-21 ENCOUNTER — Ambulatory Visit: Payer: Medicare Other | Admitting: Neurology

## 2016-07-30 ENCOUNTER — Encounter: Payer: Self-pay | Admitting: Rehabilitative and Restorative Service Providers"

## 2016-07-30 DIAGNOSIS — G459 Transient cerebral ischemic attack, unspecified: Secondary | ICD-10-CM | POA: Insufficient documentation

## 2016-07-30 DIAGNOSIS — G43109 Migraine with aura, not intractable, without status migrainosus: Secondary | ICD-10-CM | POA: Insufficient documentation

## 2016-07-30 NOTE — Therapy (Signed)
[facility] [address] [place], [place], [address] Phone: [phone]   Fax:  [phone]  Patient Details  Name: [name] MRN: [id number] Date of Birth: [birth year] Referring Provider:  No ref. provider found  Encounter Date: last encounter 06/17/2016  PHYSICAL THERAPY DISCHARGE SUMMARY  Visits from Start of Care: 4  Current functional level related to goals / functional outcomes:     PT Short Term Goals - 06/05/16 1336      PT SHORT TERM GOAL #1   Title The patient will return demo HEP for LE strength, balance and gait.   Baseline Target date 07/05/2016   Time 4   Period Weeks     PT SHORT TERM GOAL #2   Title The paient will improve Berg balance score from 38/56 up to 44/56 to demo dec'ing risk for falls.   Baseline Target date 07/05/2016   Time 4   Period Weeks     PT SHORT TERM GOAL #3   Title The patient will improve gait speed from 1.18 ft/sec to > or equal to 1.8 ft/sec to demo dec'ing risk for falls.   Baseline Target date 07/05/2016   Time 4   Period Weeks     PT SHORT TERM GOAL #4   Title The patient will improve TUG score from 35.17 seconds to < or equal to 28 seconds to demo improved moiblity.   Baseline Target date 07/05/2016   Time 4   Period Weeks     *GOALS not reassessed due to patient calling to cx b/c of foot doctor recommendations to stay off of feet.   Remaining deficits: See evaluation   Education / Equipment: HEP.  Plan: Patient agrees to discharge.  Patient goals were not met. Patient is being discharged due to a change in medical status.  ?????        Thank you for the referral of this patient. Rudell Cobb, MPT  Brenee Gajda 07/30/2016, 11:42 AM  [facility] [address] [place], [place], [address] Phone: [phone]   Fax:  [phone]

## 2016-10-19 ENCOUNTER — Telehealth: Payer: Self-pay | Admitting: Cardiology

## 2016-10-19 ENCOUNTER — Ambulatory Visit (INDEPENDENT_AMBULATORY_CARE_PROVIDER_SITE_OTHER): Payer: Medicare Other | Admitting: *Deleted

## 2016-10-19 DIAGNOSIS — I495 Sick sinus syndrome: Secondary | ICD-10-CM

## 2016-10-19 NOTE — Telephone Encounter (Signed)
LMOVM reminding pt to send remote transmission.   

## 2016-10-19 NOTE — Progress Notes (Signed)
Remote pacemaker transmission.   

## 2016-10-20 LAB — CUP PACEART REMOTE DEVICE CHECK
Battery Remaining Longevity: 116 mo
Battery Voltage: 3.03 V
Brady Statistic AP VP Percent: 0.09 %
Brady Statistic AP VS Percent: 99.41 %
Brady Statistic AS VP Percent: 0 %
Brady Statistic AS VS Percent: 0.51 %
Brady Statistic RA Percent Paced: 99.48 %
Brady Statistic RV Percent Paced: 0.09 %
Date Time Interrogation Session: 20171218203414
Implantable Lead Implant Date: 20070910
Implantable Lead Implant Date: 20070910
Implantable Lead Location: 753859
Implantable Lead Location: 753860
Implantable Lead Model: 5076
Implantable Lead Model: 5076
Implantable Pulse Generator Implant Date: 20170608
Lead Channel Impedance Value: 323 Ohm
Lead Channel Impedance Value: 437 Ohm
Lead Channel Impedance Value: 437 Ohm
Lead Channel Impedance Value: 494 Ohm
Lead Channel Pacing Threshold Amplitude: 0.625 V
Lead Channel Pacing Threshold Amplitude: 0.875 V
Lead Channel Pacing Threshold Pulse Width: 0.4 ms
Lead Channel Pacing Threshold Pulse Width: 0.4 ms
Lead Channel Sensing Intrinsic Amplitude: 1.25 mV
Lead Channel Sensing Intrinsic Amplitude: 1.25 mV
Lead Channel Sensing Intrinsic Amplitude: 15.5 mV
Lead Channel Sensing Intrinsic Amplitude: 15.5 mV
Lead Channel Setting Pacing Amplitude: 2 V
Lead Channel Setting Pacing Amplitude: 2.5 V
Lead Channel Setting Pacing Pulse Width: 0.4 ms
Lead Channel Setting Sensing Sensitivity: 2.8 mV

## 2016-10-21 ENCOUNTER — Encounter: Payer: Self-pay | Admitting: Cardiology

## 2016-10-23 ENCOUNTER — Other Ambulatory Visit: Payer: Self-pay | Admitting: Neurology

## 2016-10-23 DIAGNOSIS — G451 Carotid artery syndrome (hemispheric): Secondary | ICD-10-CM

## 2016-11-09 ENCOUNTER — Other Ambulatory Visit: Payer: Self-pay | Admitting: *Deleted

## 2016-11-09 MED ORDER — AMLODIPINE BESYLATE 2.5 MG PO TABS: 2.5000 mg | ORAL_TABLET | Freq: Every day | ORAL | 2 refills | Status: DC

## 2016-11-14 IMAGING — CR DG CHEST 2V
2 series · 2 of 2 positions shown · non-contrast
Comparison: 07/13/2006

CLINICAL DATA: History of cardiac issues. Complains of pericardial
chest pain and increasing shortness of breath. Nonsmoker.

EXAM:
CHEST  2 VIEW

[w chest pa]
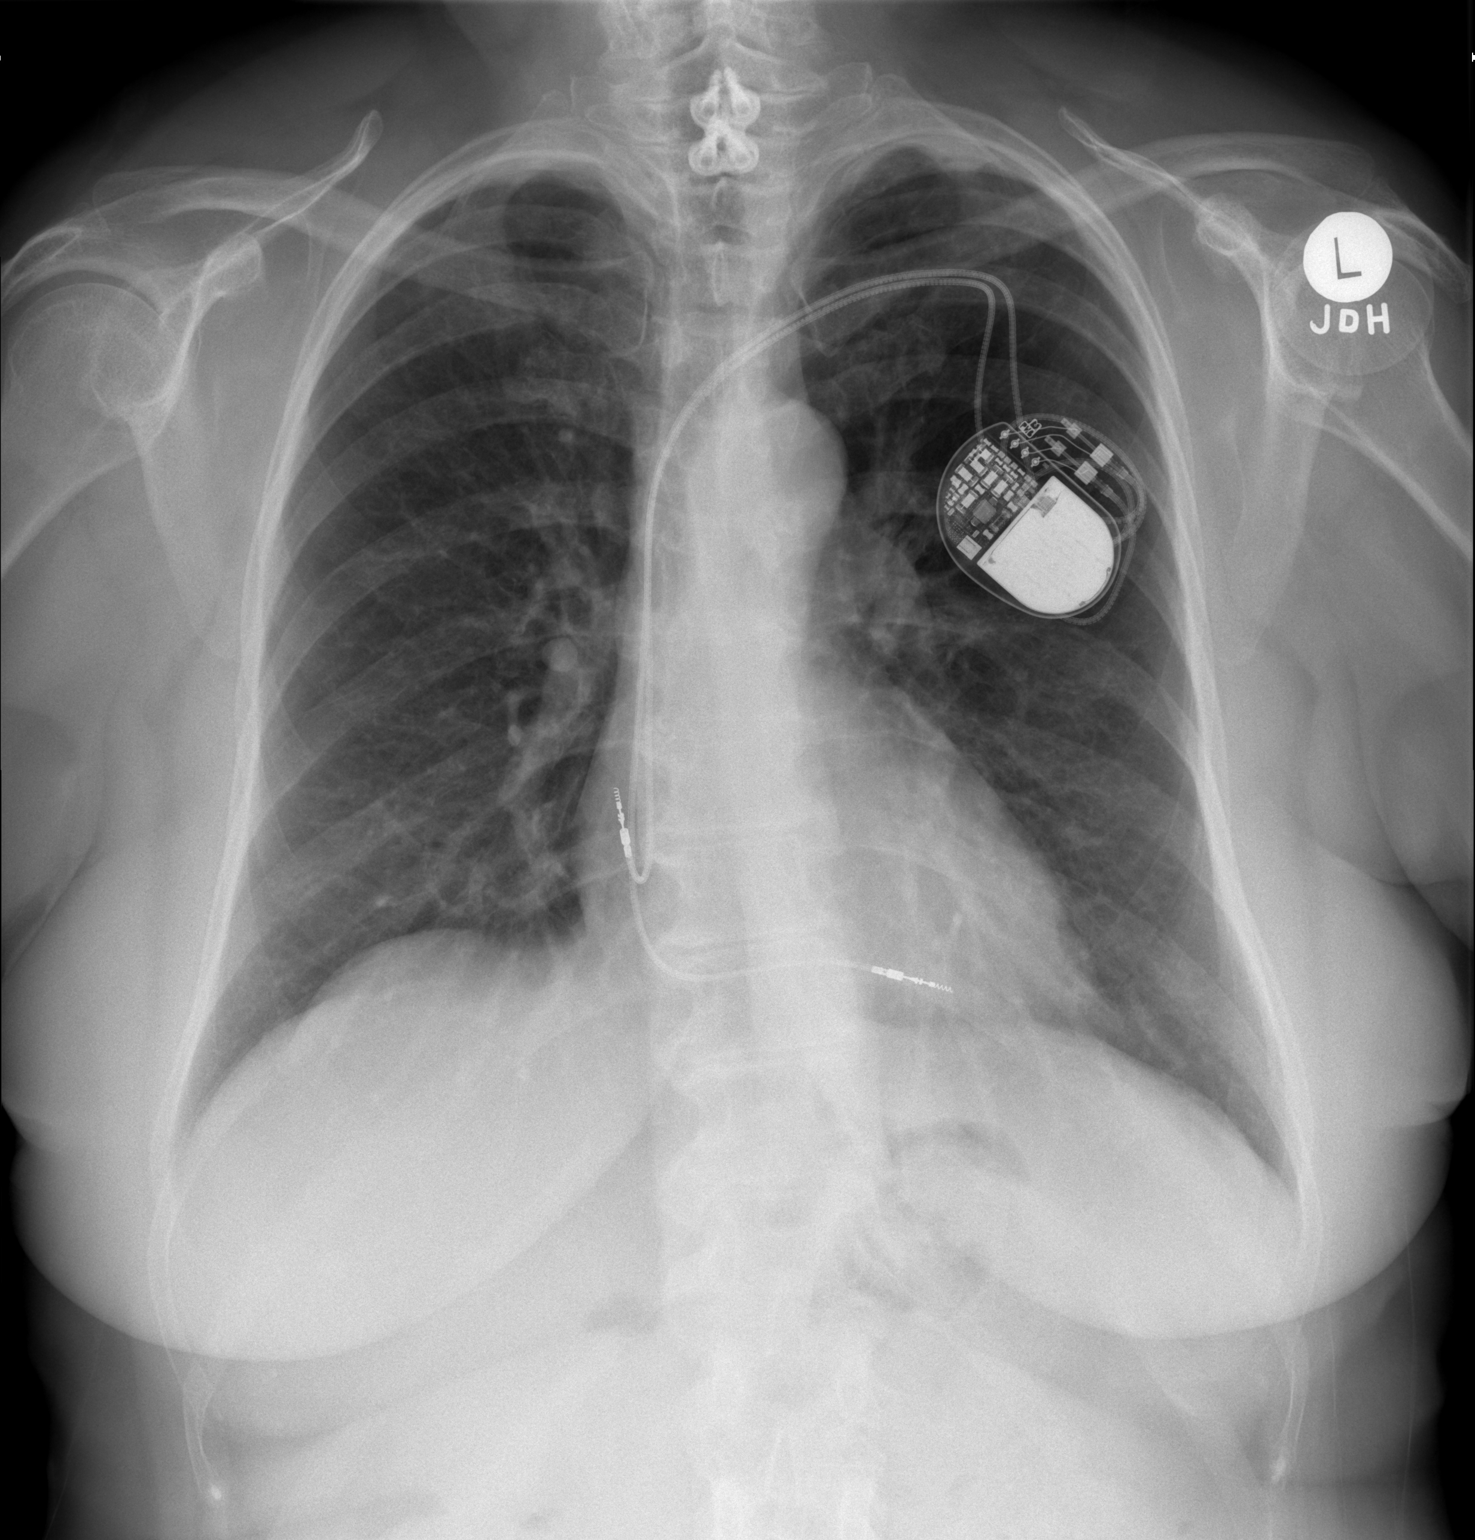

[w chest lat]
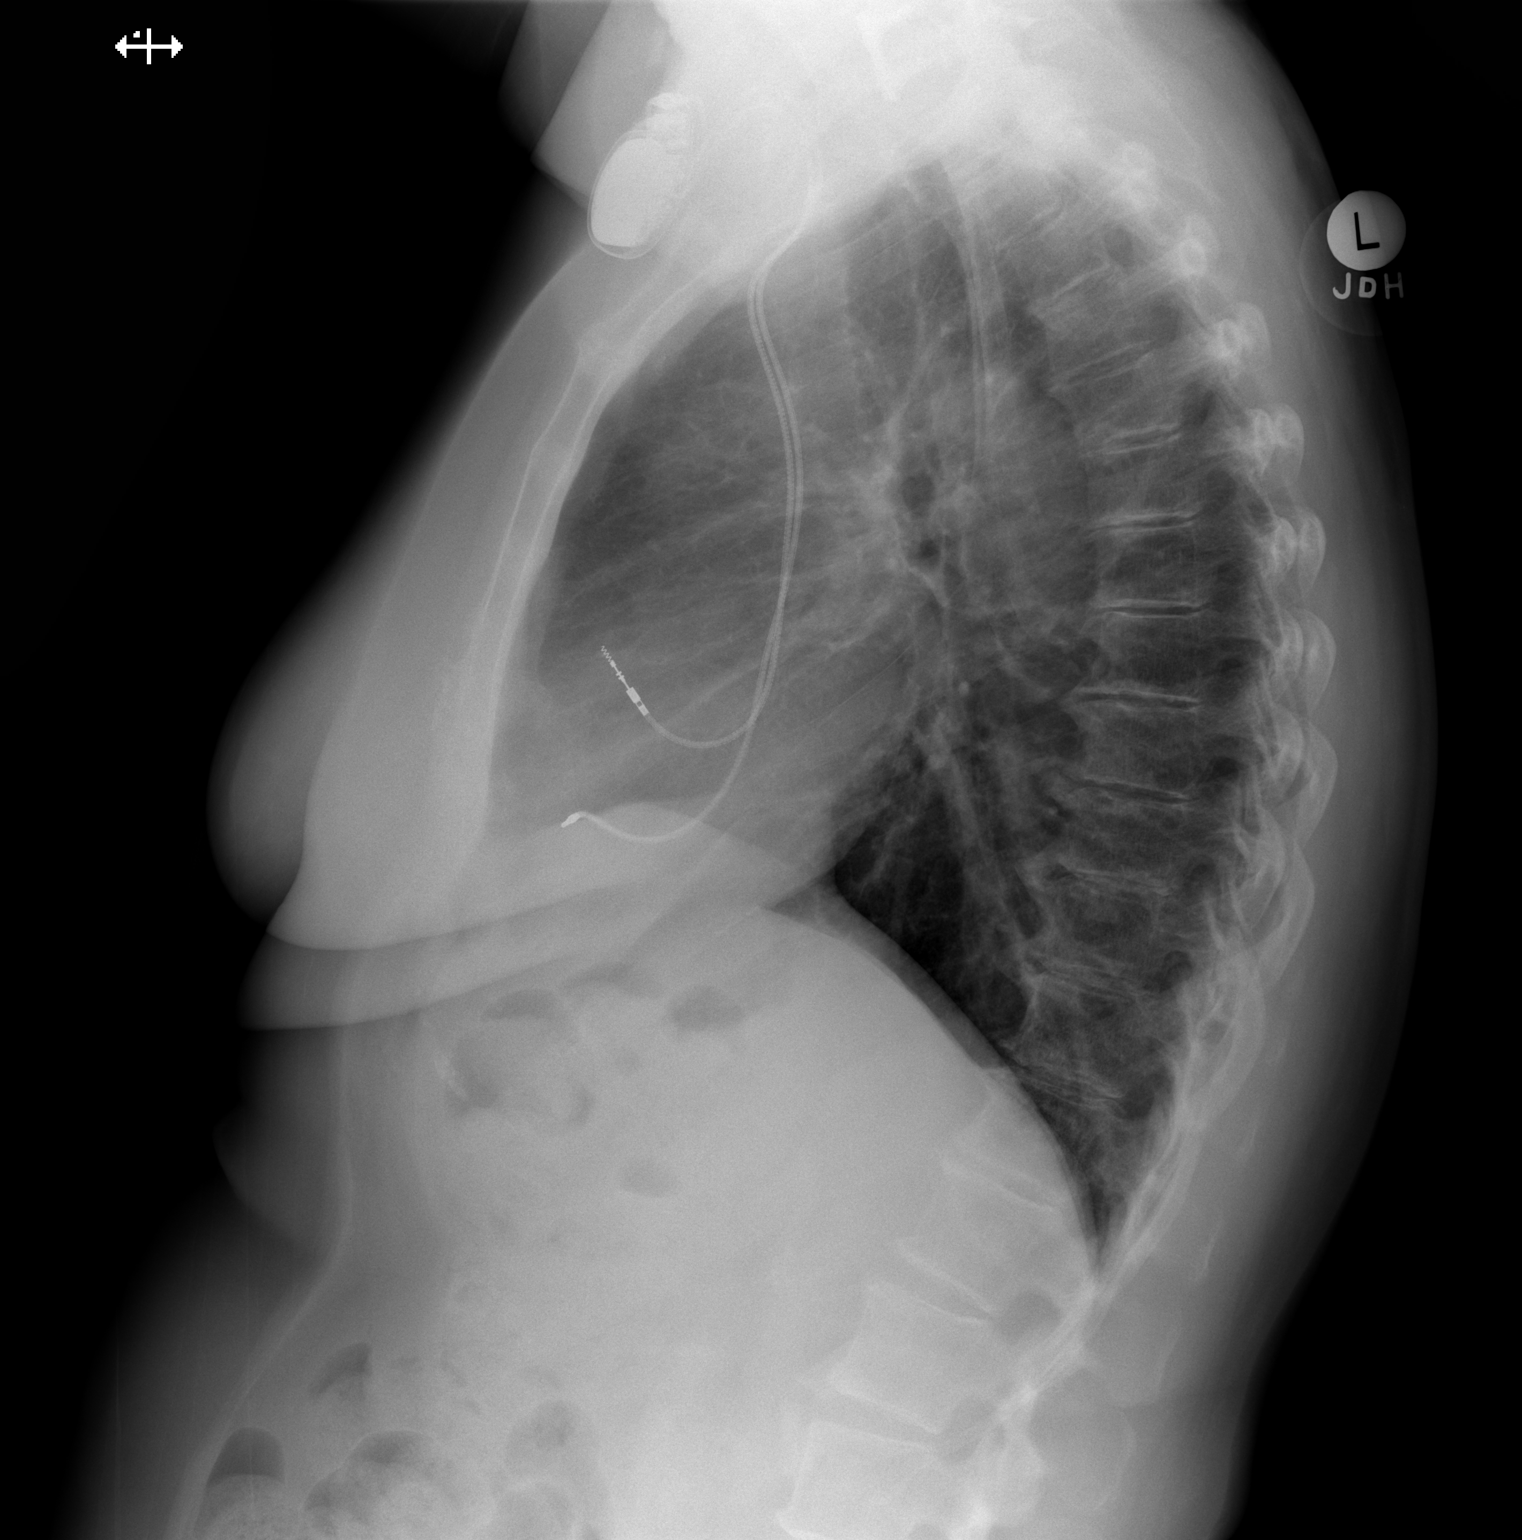

[2 of 2 positions shown; findings below may reference images not displayed]

FINDINGS: Left-sided transvenous pacemaker with leads to the right atrium and
right ventricle. Heart is normal in size. There are no focal
consolidations and pleural effusions. No pulmonary edema. Note is
made of minimal biapical pleural parenchymal change, stable over
prior studies. Mid thoracic spondylosis.
IMPRESSION: No evidence for acute cardiopulmonary abnormality.

## 2017-01-18 ENCOUNTER — Ambulatory Visit (INDEPENDENT_AMBULATORY_CARE_PROVIDER_SITE_OTHER): Payer: Medicare Other | Admitting: *Deleted

## 2017-01-18 ENCOUNTER — Telehealth: Payer: Self-pay | Admitting: Cardiology

## 2017-01-18 DIAGNOSIS — I495 Sick sinus syndrome: Secondary | ICD-10-CM

## 2017-01-18 NOTE — Telephone Encounter (Signed)
Spoke with pt and reminded pt of remote transmission that is due today. Pt verbalized understanding.   

## 2017-01-18 NOTE — Progress Notes (Signed)
Remote pacemaker transmission.   

## 2017-01-20 ENCOUNTER — Encounter: Payer: Self-pay | Admitting: Cardiology

## 2017-01-20 LAB — CUP PACEART REMOTE DEVICE CHECK
Battery Remaining Longevity: 111 mo
Battery Voltage: 3.02 V
Brady Statistic AP VP Percent: 0.11 %
Brady Statistic AP VS Percent: 99.29 %
Brady Statistic AS VP Percent: 0 %
Brady Statistic AS VS Percent: 0.6 %
Brady Statistic RA Percent Paced: 99.37 %
Brady Statistic RV Percent Paced: 0.11 %
Date Time Interrogation Session: 20180319175457
Implantable Lead Implant Date: 20070910
Implantable Lead Implant Date: 20070910
Implantable Lead Location: 753859
Implantable Lead Location: 753860
Implantable Lead Model: 5076
Implantable Lead Model: 5076
Implantable Pulse Generator Implant Date: 20170608
Lead Channel Impedance Value: 323 Ohm
Lead Channel Impedance Value: 399 Ohm
Lead Channel Impedance Value: 437 Ohm
Lead Channel Impedance Value: 456 Ohm
Lead Channel Pacing Threshold Amplitude: 0.875 V
Lead Channel Pacing Threshold Amplitude: 1 V
Lead Channel Pacing Threshold Pulse Width: 0.4 ms
Lead Channel Pacing Threshold Pulse Width: 0.4 ms
Lead Channel Sensing Intrinsic Amplitude: 1.625 mV
Lead Channel Sensing Intrinsic Amplitude: 1.625 mV
Lead Channel Sensing Intrinsic Amplitude: 14.875 mV
Lead Channel Sensing Intrinsic Amplitude: 14.875 mV
Lead Channel Setting Pacing Amplitude: 2 V
Lead Channel Setting Pacing Amplitude: 2.5 V
Lead Channel Setting Pacing Pulse Width: 0.4 ms
Lead Channel Setting Sensing Sensitivity: 2.8 mV

## 2017-01-30 ENCOUNTER — Other Ambulatory Visit (HOSPITAL_COMMUNITY): Payer: Self-pay | Admitting: Psychiatry

## 2017-02-03 ENCOUNTER — Encounter: Payer: Self-pay | Admitting: Cardiology

## 2017-02-15 ENCOUNTER — Other Ambulatory Visit: Payer: Self-pay

## 2017-02-15 MED ORDER — LOSARTAN POTASSIUM 100 MG PO TABS: 100.0000 mg | ORAL_TABLET | Freq: Every day | ORAL | 0 refills | Status: DC

## 2017-02-15 NOTE — Telephone Encounter (Signed)
Rx(s) sent to pharmacy electronically.  

## 2017-02-25 ENCOUNTER — Other Ambulatory Visit: Payer: Self-pay | Admitting: Physician Assistant

## 2017-02-25 DIAGNOSIS — Z1231 Encounter for screening mammogram for malignant neoplasm of breast: Secondary | ICD-10-CM

## 2017-03-19 ENCOUNTER — Ambulatory Visit
Admission: RE | Admit: 2017-03-19 | Discharge: 2017-03-19 | Disposition: A | Discharge status: Home / Self Care | Payer: Medicare Other | Source: Ambulatory Visit | Attending: Physician Assistant | Admitting: Physician Assistant

## 2017-03-19 DIAGNOSIS — Z1231 Encounter for screening mammogram for malignant neoplasm of breast: Secondary | ICD-10-CM

## 2017-04-15 ENCOUNTER — Ambulatory Visit (INDEPENDENT_AMBULATORY_CARE_PROVIDER_SITE_OTHER): Payer: Medicare Other | Admitting: Internal Medicine

## 2017-04-15 ENCOUNTER — Encounter: Payer: Self-pay | Admitting: Internal Medicine

## 2017-04-15 VITALS — BP 132/70 | HR 82 | Ht 66.0 in | Wt 172.8 lb

## 2017-04-15 DIAGNOSIS — Z950 Presence of cardiac pacemaker: Secondary | ICD-10-CM

## 2017-04-15 DIAGNOSIS — I495 Sick sinus syndrome: Secondary | ICD-10-CM

## 2017-04-15 LAB — CUP PACEART INCLINIC DEVICE CHECK
Battery Remaining Longevity: 105 mo
Battery Voltage: 3.02 V
Brady Statistic AP VP Percent: 0.09 %
Brady Statistic AP VS Percent: 99.37 %
Brady Statistic AS VP Percent: 0 %
Brady Statistic AS VS Percent: 0.53 %
Brady Statistic RA Percent Paced: 99.45 %
Brady Statistic RV Percent Paced: 0.09 %
Date Time Interrogation Session: 20180614103401
Implantable Lead Implant Date: 20070910
Implantable Lead Implant Date: 20070910
Implantable Lead Location: 753859
Implantable Lead Location: 753860
Implantable Lead Model: 5076
Implantable Lead Model: 5076
Implantable Pulse Generator Implant Date: 20170608
Lead Channel Impedance Value: 323 Ohm
Lead Channel Impedance Value: 437 Ohm
Lead Channel Impedance Value: 437 Ohm
Lead Channel Impedance Value: 475 Ohm
Lead Channel Pacing Threshold Amplitude: 0.75 V
Lead Channel Pacing Threshold Amplitude: 1 V
Lead Channel Pacing Threshold Pulse Width: 0.4 ms
Lead Channel Pacing Threshold Pulse Width: 0.4 ms
Lead Channel Sensing Intrinsic Amplitude: 16.375 mV
Lead Channel Setting Pacing Amplitude: 2 V
Lead Channel Setting Pacing Amplitude: 2.5 V
Lead Channel Setting Pacing Pulse Width: 0.4 ms
Lead Channel Setting Sensing Sensitivity: 2.8 mV

## 2017-04-15 NOTE — Patient Instructions (Signed)
Medication Instructions:  Your physician recommends that you continue on your current medications as directed. Please refer to the Current Medication list given to you today.   Labwork: None Ordered   Testing/Procedures: None Ordered   Follow-Up: Your physician wants you to follow-up in: 1 year with Dr. Taylor. You will receive a reminder letter in the mail two months in advance. If you don't receive a letter, please call our office to schedule the follow-up appointment.  Remote monitoring is used to monitor your Pacemaker  from home. This monitoring reduces the number of office visits required to check your device to one time per year. It allows us to keep an eye on the functioning of your device to ensure it is working properly. You are scheduled for a device check from home on  07/15/17 . You may send your transmission at any time that day. If you have a wireless device, the transmission will be sent automatically. After your physician reviews your transmission, you will receive a postcard with your next transmission date.    Any Other Special Instructions Will Be Listed Below (If Applicable).     If you need a refill on your cardiac medications before your next appointment, please call your pharmacy.   

## 2017-04-15 NOTE — Progress Notes (Signed)
HPI [name] returns today for followup. She is a very pleasant 76 year old woman with hypertension and sinus node dysfunction, status post permanent pacemaker insertion. She underwent PPM gen change about 12 months ago. In the interim, she feels better but admits to a sedentary lifestyle. No chest pain or sob. No edema. No syncope. Her biggest problem is that she has bad lower back pain and leg pain.  Allergies  Allergen Reactions  . Citalopram     Other reaction(s): Serum Sickness (ALLERGY) Serotonin Syndrome  . Codeine Other (See Comments)    hallucinations  . Cymbalta [Duloxetine Hcl] Other (See Comments)    Serotonin Syndrome  . Metformin And Related     Causes dehydration and causes me to go to the hospital  . Paroxetine Hcl     Serotonin Syndrome  . Trazodone And Nefazodone Other (See Comments)    Serotonin Syndrome  . Caffeine Anxiety    Nervousness      Current Outpatient Prescriptions  Medication Sig Dispense Refill  . amLODipine (NORVASC) 2.5 MG tablet Take 1 tablet (2.5 mg total) by mouth daily. 90 tablet 2  . atorvastatin (LIPITOR) 80 MG tablet Take 1 tablet (80 mg total) by mouth every evening. 30 tablet 1  . buPROPion (WELLBUTRIN XL) 300 MG 24 hr tablet Take 300 mg by mouth daily before breakfast.    . clonazePAM (KLONOPIN) 0.5 MG tablet Take 0.5-1 mg by mouth 2 (two) times daily. 0.5 mg at lunchtime daily and 1 mg at night    . clopidogrel (PLAVIX) 75 MG tablet Take 1 tablet (75 mg total) by mouth daily. 30 tablet 11  . Coenzyme Q10 200 MG capsule Take 200 mg by mouth daily with lunch.    . cyanocobalamin 500 MCG tablet Take 1,000 mcg by mouth daily with lunch. Vitamin B12    . doxepin (SINEQUAN) 10 MG capsule Take 1 capsule by mouth at bedtime.    . gabapentin (NEURONTIN) 100 MG capsule TAKE 1 CAPSULE BY MOUTH THREE TIMES DAILY 270 capsule 0  . glipiZIDE (GLUCOTROL) 5 MG tablet Take 1 tablet (5 mg total) by mouth daily before breakfast. 30 tablet 1  .  glucose blood (ONE TOUCH ULTRA TEST) test strip AS DIRECTED TWICE DAILY    . hydrochlorothiazide (HYDRODIURIL) 25 MG tablet Take 25 mg by mouth daily.    Marland Kitchen. losartan (COZAAR) 100 MG tablet Take 1 tablet (100 mg total) by mouth daily. Patient needs to call our office to schedule an appointment with Dr SwazilandJordan for future refills. 90 tablet 0  . Multiple Vitamins-Minerals (ANTIOXIDANT PO) Take 1 tablet by mouth daily with lunch.    . Omega-3 Fatty Acids (FISH OIL PO) Take 2 capsules by mouth daily with lunch.    Marland Kitchen. OVER THE COUNTER MEDICATION Take 2 tablets by mouth daily with lunch. Circulation and Vein Support by Feliberto HartsAndrew Lessmon    . pantoprazole (PROTONIX) 40 MG tablet Take 1 tablet (40 mg total) by mouth 2 (two) times daily before a meal. 60 tablet 2   No current facility-administered medications for this visit.      Past Medical History:  Diagnosis Date  . Allergy   . Arthritis   . Cardiac arrhythmia    now has pacemaker  . Cataract   . Diverticulosis   . DM (diabetes mellitus) (HCC)   . Fibromyalgia   . GERD (gastroesophageal reflux disease)   . Hyperlipidemia   . Hypertension   . Internal and external hemorrhoids without complication   .  Myocardial infarction (HCC)   . Potassium deficiency     ROS:   All systems reviewed and negative except as noted in the HPI.   Past Surgical History:  Procedure Laterality Date  . ABDOMINAL HYSTERECTOMY    . APPENDECTOMY    . bil carple tunnel surgery    . CARDIAC CATHETERIZATION N/A 04/09/2015   Procedure: Left Heart Cath and Coronary Angiography;  Surgeon: Peter M Swaziland, MD;  Location: [facility] INVASIVE CV LAB;  Service: Cardiovascular;  Laterality: N/A;  . COLONOSCOPY  multiple  . EP IMPLANTABLE DEVICE N/A 04/09/2016   Procedure: PPM Generator Changeout;  Surgeon: Marinus Maw, MD;  Location: [facility] INVASIVE CV LAB;  Service: Cardiovascular;  Laterality: N/A;  . KNEE SURGERY     right x 2  . NECK SURGERY     x2  . PACEMAKER INSERTION    .  UPPER GASTROINTESTINAL ENDOSCOPY       Family History  Problem Relation Age of Onset  . Coronary artery disease Father   . Prostate cancer Father   . Colon cancer Father   . Lung cancer Father   . Diabetes Father   . Heart attack Father   . Diabetes Daughter   . Dementia Daughter   . Colon cancer Sister        x 2  . Lung cancer Mother   . Dementia Mother   . Colon polyps Sister   . Crohn's disease Sister   . Diabetes Maternal Aunt        multiple  . Breast cancer Maternal Aunt   . Colon cancer Brother   . Kidney disease Daughter   . Diabetes Cousin        multiple  . Diabetes Maternal Uncle        multiple  . Irritable bowel syndrome Sister   . Heart attack Daughter   . Heart attack Brother   . Hypertension Neg Hx   . Esophageal cancer Neg Hx   . Rectal cancer Neg Hx   . Stomach cancer Neg Hx      Social History   Social History  . Marital status: Married    Spouse name: N/A  . Number of children: 2  . Years of education: 12   Occupational History  . Retired Retired   Social History Main Topics  . Smoking status: Never Smoker  . Smokeless tobacco: Never Used  . Alcohol use No  . Drug use: No  . Sexual activity: Not on file   Other Topics Concern  . Not on file   Social History Narrative   Patient is married with 2 children.   Patient is right handed.   Patient has 12 th grade education.   Patient does not drink caffeine.     BP 132/70   Pulse 82   Ht 5\' 6"  (1.676 m)   Wt 172 lb 12.8 oz (78.4 kg)   SpO2 98%   BMI 27.89 kg/m   Physical Exam:  Well appearing 76 year old woman, NAD HEENT: Unremarkable Neck:  6 cm JVD, no thyromegally Lungs:  Clear with no wheezes, rales, or rhonchi. HEART:  Regular rate rhythm, no murmurs, no rubs, no clicks Abd:  soft, positive bowel sounds, no organomegally, no rebound, no guarding Ext:  2 plus pulses, no edema, no cyanosis, no clubbing Skin:  No rashes no nodules Neuro:  CN II through XII intact,  motor grossly intact  DEVICE  Normal device function.  See PaceArt for details.  Assess/Plan:  1. Sinus node dysfunction - she is doing well s/p PPM insertion. 2. PPM - her Medtr[employer] device is working normally. Will recheck in several months. 3. HTN - her blood pressure is well controlled today on medical therapy, no changes.  4. CAD - she denies anginal symptoms. I have asked her to start walking 30 minutes a day. She states that she will try harder.  Leonia Reeves.D.

## 2017-05-26 ENCOUNTER — Ambulatory Visit (INDEPENDENT_AMBULATORY_CARE_PROVIDER_SITE_OTHER): Payer: Medicare Other | Admitting: Gastroenterology

## 2017-05-26 ENCOUNTER — Encounter: Payer: Self-pay | Admitting: Gastroenterology

## 2017-05-26 VITALS — BP 110/80 | HR 72 | Ht 66.0 in | Wt 168.4 lb

## 2017-05-26 DIAGNOSIS — K219 Gastro-esophageal reflux disease without esophagitis: Secondary | ICD-10-CM

## 2017-05-26 DIAGNOSIS — R1319 Other dysphagia: Secondary | ICD-10-CM | POA: Diagnosis not present

## 2017-05-26 MED ORDER — RANITIDINE HCL 150 MG PO TABS: ORAL_TABLET | ORAL | 3 refills | Status: DC

## 2017-05-26 NOTE — Progress Notes (Addendum)
05/26/2017 [name] [id number] [birth year]   HISTORY OF PRESENT ILLNESS:  This is a pleasant 76 year old female who is known to Dr. Leone PayorGessner. He follows her for complaints of GERD and esophageal dysphasia. She is currently on Protonix 40 mg twice daily for her reflux and has been taking that for quite some time. Her last EGD was in August 2014 at which time she was found to have a 2 cm hiatal hernia, but the study was otherwise unremarkable. She presents to our office today again with complaints of worsening reflux over the past 6 months. She says that she feels acid burning up into her throat. She says that for the past 3 or 4 weeks her mouth feels like it is on fire. Sometimes this causes her to cough. She says that she did add zantac 150 mg at bedtime for the past couple of weeks and thinks that she has noticed some improvement with that.  She had similar complaints at her last visit in August 2014 and that is why her EGD was repeated at that time.   Past Medical History:  Diagnosis Date  . Allergy   . Arthritis   . Cardiac arrhythmia    now has pacemaker  . Cataract   . Diverticulosis   . DM (diabetes mellitus) (HCC)   . Fibromyalgia   . GERD (gastroesophageal reflux disease)   . Hyperlipidemia   . Hypertension   . Internal and external hemorrhoids without complication   . Myocardial infarction (HCC)   . Potassium deficiency    Past Surgical History:  Procedure Laterality Date  . ABDOMINAL HYSTERECTOMY    . APPENDECTOMY    . bil carple tunnel surgery    . CARDIAC CATHETERIZATION N/A 04/09/2015   Procedure: Left Heart Cath and Coronary Angiography;  Surgeon: Peter M SwazilandJordan, MD;  [facility];  Service: Cardiovascular;  Laterality: N/A;  . COLONOSCOPY  multiple  . EP IMPLANTABLE DEVICE N/A 04/09/2016   Procedure: PPM Generator Changeout;  Surgeon: Marinus MawGregg W Taylor, MD;  [facility];  Service: Cardiovascular;  Laterality: N/A;  .  KNEE SURGERY     right x 2  . NECK SURGERY     x2  . PACEMAKER INSERTION    . UPPER GASTROINTESTINAL ENDOSCOPY      reports that she has never smoked. She has never used smokeless tobacco. She reports that she does not drink alcohol or use drugs. family history includes Breast cancer in her maternal aunt; Colon cancer in her brother, father, and sister; Colon polyps in her sister; Coronary artery disease in her father; Crohn's disease in her sister; Dementia in her daughter and mother; Diabetes in her cousin, daughter, father, maternal aunt, and maternal uncle; Heart attack in her brother, daughter, and father; Irritable bowel syndrome in her sister; Kidney disease in her daughter; Lung cancer in her father and mother; Prostate cancer in her father. Allergies  Allergen Reactions  . Citalopram     Other reaction(s): Serum Sickness (ALLERGY) Serotonin Syndrome  . Codeine Other (See Comments)    hallucinations  . Cymbalta [Duloxetine Hcl] Other (See Comments)    Serotonin Syndrome  . Metformin And Related     Causes dehydration and causes me to go to the hospital  . Paroxetine Hcl     Serotonin Syndrome  . Trazodone And Nefazodone Other (See Comments)    Serotonin Syndrome  . Caffeine Anxiety    Nervousness  Outpatient Encounter Prescriptions as of 05/26/2017  Medication Sig  . amLODipine (NORVASC) 2.5 MG tablet Take 1 tablet (2.5 mg total) by mouth daily.  Marland Kitchen. atorvastatin (LIPITOR) 80 MG tablet Take 1 tablet (80 mg total) by mouth every evening.  Marland Kitchen. buPROPion (WELLBUTRIN XL) 300 MG 24 hr tablet Take 300 mg by mouth daily before breakfast.  . clonazePAM (KLONOPIN) 0.5 MG tablet Take 0.5-1 mg by mouth 2 (two) times daily. 0.5 mg at lunchtime daily and 1 mg at night  . clopidogrel (PLAVIX) 75 MG tablet Take 1 tablet (75 mg total) by mouth daily.  . Coenzyme Q10 200 MG capsule Take 200 mg by mouth daily with lunch.  . cyanocobalamin 500 MCG tablet Take 1,000 mcg by mouth daily with  lunch. Vitamin B12  . doxepin (SINEQUAN) 10 MG capsule Take 1 capsule by mouth at bedtime.  . gabapentin (NEURONTIN) 300 MG capsule TK ONE C PO TID  . glipiZIDE (GLUCOTROL XL) 5 MG 24 hr tablet daily with breakfast.   . glucose blood (ONE TOUCH ULTRA TEST) test strip AS DIRECTED TWICE DAILY  . hydrochlorothiazide (HYDRODIURIL) 25 MG tablet Take 25 mg by mouth daily.  Marland Kitchen. losartan (COZAAR) 100 MG tablet Take 1 tablet (100 mg total) by mouth daily. Patient needs to call our office to schedule an appointment with Dr SwazilandJordan for future refills.  . Multiple Vitamins-Minerals (ANTIOXIDANT PO) Take 1 tablet by mouth daily with lunch.  . Omega-3 Fatty Acids (FISH OIL PO) Take 2 capsules by mouth daily with lunch.  Marland Kitchen. OVER THE COUNTER MEDICATION Take 2 tablets by mouth daily with lunch. Circulation and Vein Support by Feliberto HartsAndrew Lessmon  . pantoprazole (PROTONIX) 40 MG tablet Take 1 tablet (40 mg total) by mouth 2 (two) times daily before a meal.  . tiZANidine (ZANAFLEX) 2 MG tablet Take 2 mg by mouth.  . [DISCONTINUED] gabapentin (NEURONTIN) 100 MG capsule TAKE 1 CAPSULE BY MOUTH THREE TIMES DAILY  . [DISCONTINUED] glipiZIDE (GLUCOTROL) 5 MG tablet Take 1 tablet (5 mg total) by mouth daily before breakfast.   No facility-administered encounter medications on file as of 05/26/2017.      REVIEW OF SYSTEMS  : All other systems reviewed and negative except where noted in the History of Present Illness.   PHYSICAL EXAM: BP 110/80   Pulse 72   Ht 5\' 6"  (1.676 m)   Wt 168 lb 6 oz (76.4 kg)   BMI 27.18 kg/m  General: Well developed white female in no acute distress Head: Normocephalic and atraumatic Eyes:  Sclerae anicteric, conjunctiva pink. Ears: Normal auditory acuity Lungs: Clear throughout to auscultation; no increased WOB. Heart: Regular rate and rhythm; no M/R/G. Abdomen: Soft, non-distended.  BS present.  Non-tender. Musculoskeletal: Symmetrical with no gross deformities  Skin: No lesions on  visible extremities Extremities: No edema  Neurological: Alert oriented x 4, grossly non-focal Psychological:  Alert and cooperative. Normal mood and affect  ASSESSMENT AND PLAN: -GERD and dysphagia:  Very symptomatic again despite pantoprazole 40 mg BID.  Somewhat improved with adding zantac 150 mg at bedtime.  She will continue with this regimen.  I am going to schedule her for an EGD to evaluate for active reflux, rule out stricture, evaluate for dysmotility.  Pending results of this then ? If she may need a pH study/impendence.  CC:  Charlies Silversouillard, Jennifer, GeorgiaPA*  Agree with Ms. Rise MuZehr's management.  Iva Booparl E. Gessner, MD, Clementeen GrahamFACG

## 2017-05-26 NOTE — Patient Instructions (Addendum)
We have sent the following medications to your pharmacy for you to pick up at your convenience: [facility] [place]. 1. Zantac 150 mg.   You have been scheduled for a Barium Esophogram at [facility]  on Friday 7-27 at 10:00 am. Please arrive at 9:45 am to your appointment for registration. Make certain not to have anything to eat or drink 3 hours prior to your test. If you need to reschedule for any reason, please contact radiology at [phone] to do so.  Go to the [facility]ance on [address]. There is valet parking. __________________________________________________________________ A barium swallow is an examination that concentrates on views of the esophagus. This tends to be a double contrast exam (barium and two liquids which, when combined, create a gas to distend the wall of the oesophagus) or single contrast (non-ionic iodine based). The study is usually tailored to your symptoms so a good history is essential. Attention is paid during the study to the form, structure and configuration of the esophagus, looking for functional disorders (such as aspiration, dysphagia, achalasia, motility and reflux) EXAMINATION You may be asked to change into a gown, depending on the type of swallow being performed. A radiologist and radiographer will perform the procedure. The radiologist will advise you of the type of contrast selected for your procedure and direct you during the exam. You will be asked to stand, sit or lie in several different positions and to hold a small amount of fluid in your mouth before being asked to swallow while the imaging is performed .In some instances you may be asked to swallow barium coated marshmallows to assess the motility of a solid food bolus. The exam can be recorded as a digital or video fluoroscopy procedure. POST PROCEDURE It will take 1-2 days for the barium to pass through your system. To facilitate this, it is important, unless otherwise directed,  to increase your fluids for the next 24-48hrs and to resume your normal diet.  This test typically takes about 30 minutes to perform. __________________________________________________________________________________

## 2017-05-28 ENCOUNTER — Ambulatory Visit (HOSPITAL_COMMUNITY)
Admission: RE | Admit: 2017-05-28 | Discharge: 2017-05-28 | Disposition: A | Discharge status: Home / Self Care | Payer: Medicare Other | Source: Ambulatory Visit | Attending: Gastroenterology | Admitting: Gastroenterology

## 2017-05-28 DIAGNOSIS — R1319 Other dysphagia: Secondary | ICD-10-CM | POA: Insufficient documentation

## 2017-05-28 DIAGNOSIS — K219 Gastro-esophageal reflux disease without esophagitis: Secondary | ICD-10-CM | POA: Diagnosis present

## 2017-05-28 DIAGNOSIS — K449 Diaphragmatic hernia without obstruction or gangrene: Secondary | ICD-10-CM | POA: Insufficient documentation

## 2017-05-28 DIAGNOSIS — K224 Dyskinesia of esophagus: Secondary | ICD-10-CM | POA: Diagnosis not present

## 2017-07-15 ENCOUNTER — Telehealth: Payer: Self-pay | Admitting: Cardiology

## 2017-07-15 ENCOUNTER — Ambulatory Visit (INDEPENDENT_AMBULATORY_CARE_PROVIDER_SITE_OTHER): Payer: Medicare Other | Admitting: *Deleted

## 2017-07-15 DIAGNOSIS — I495 Sick sinus syndrome: Secondary | ICD-10-CM

## 2017-07-15 NOTE — Telephone Encounter (Signed)
Spoke with pt and reminded pt of remote transmission that is due today. Pt verbalized understanding.   

## 2017-07-16 ENCOUNTER — Encounter: Payer: Self-pay | Admitting: Cardiology

## 2017-07-16 NOTE — Progress Notes (Signed)
Remote pacemaker transmission.   

## 2017-07-20 ENCOUNTER — Other Ambulatory Visit: Payer: Self-pay | Admitting: Internal Medicine

## 2017-07-20 ENCOUNTER — Telehealth: Payer: Self-pay | Admitting: Gastroenterology

## 2017-07-20 ENCOUNTER — Other Ambulatory Visit: Payer: Self-pay

## 2017-07-20 MED ORDER — SUCRALFATE 1 G PO TABS: 1.0000 g | ORAL_TABLET | Freq: Three times a day (TID) | ORAL | 1 refills | Status: DC

## 2017-07-20 NOTE — Telephone Encounter (Signed)
Pt states that the liquid that comes up in her throat and mouth burns her mouth and throat, states is even bothers her ears. Pt scheduled for EM with ph impedence test at [facility] endo 08/02/17 at 9am. Dr. Leone Payor do you want her to perform test off of PPI meds or continue them?

## 2017-07-20 NOTE — Telephone Encounter (Signed)
On current meds thanks  Lets Rx carafate 1 g qid ac/hs and disp # 120 1 refill  She needs to stop this 1 day before study  Thanks  CEG

## 2017-07-20 NOTE — Telephone Encounter (Signed)
Pt aware of appt, script sent to pharmacy. Pt knows to hold carafate the day before EM.

## 2017-07-20 NOTE — Telephone Encounter (Signed)
Pt states she is having issues with gerd. States she is taking protonix in the am and pm along with zantac  qhs. Pt reports she has reflux coming up in her throat, states it does not matter if she is standing up or lying down it continues to happen. Pt wants to know what she can do. Please advise.

## 2017-07-20 NOTE — Telephone Encounter (Signed)
Ask her if she is having burning and fluid regurgitation or just burning?  Next diagnostic step is for her to do an esophageal manometry with 24 hour pH/impedance  Please explain what that is to her and ask if she is willing to do so

## 2017-07-21 LAB — CUP PACEART REMOTE DEVICE CHECK
Battery Remaining Longevity: 105 mo
Battery Voltage: 3.02 V
Brady Statistic AP VP Percent: 0.1 %
Brady Statistic AP VS Percent: 99.63 %
Brady Statistic AS VP Percent: 0 %
Brady Statistic AS VS Percent: 0.27 %
Brady Statistic RA Percent Paced: 99.71 %
Brady Statistic RV Percent Paced: 0.1 %
Date Time Interrogation Session: 20180913161154
Implantable Lead Implant Date: 20070910
Implantable Lead Implant Date: 20070910
Implantable Lead Location: 753859
Implantable Lead Location: 753860
Implantable Lead Model: 5076
Implantable Lead Model: 5076
Implantable Pulse Generator Implant Date: 20170608
Lead Channel Impedance Value: 323 Ohm
Lead Channel Impedance Value: 418 Ohm
Lead Channel Impedance Value: 418 Ohm
Lead Channel Impedance Value: 456 Ohm
Lead Channel Pacing Threshold Amplitude: 0.75 V
Lead Channel Pacing Threshold Amplitude: 0.875 V
Lead Channel Pacing Threshold Pulse Width: 0.4 ms
Lead Channel Pacing Threshold Pulse Width: 0.4 ms
Lead Channel Sensing Intrinsic Amplitude: 1.625 mV
Lead Channel Sensing Intrinsic Amplitude: 1.625 mV
Lead Channel Sensing Intrinsic Amplitude: 14.5 mV
Lead Channel Sensing Intrinsic Amplitude: 14.5 mV
Lead Channel Setting Pacing Amplitude: 2 V
Lead Channel Setting Pacing Amplitude: 2.5 V
Lead Channel Setting Pacing Pulse Width: 0.4 ms
Lead Channel Setting Sensing Sensitivity: 2.8 mV

## 2017-08-02 ENCOUNTER — Telehealth: Payer: Self-pay | Admitting: Internal Medicine

## 2017-08-02 ENCOUNTER — Ambulatory Visit (HOSPITAL_COMMUNITY)
Admission: RE | Admit: 2017-08-02 | Discharge: 2017-08-02 | Disposition: A | Discharge status: Home / Self Care | Payer: Medicare Other | Source: Ambulatory Visit | Attending: Gastroenterology | Admitting: Gastroenterology

## 2017-08-02 ENCOUNTER — Encounter (HOSPITAL_COMMUNITY)
Admission: RE | Disposition: A | Discharge status: Home / Self Care | Payer: Self-pay | Source: Ambulatory Visit | Attending: Gastroenterology

## 2017-08-02 DIAGNOSIS — K219 Gastro-esophageal reflux disease without esophagitis: Secondary | ICD-10-CM | POA: Diagnosis not present

## 2017-08-02 DIAGNOSIS — K449 Diaphragmatic hernia without obstruction or gangrene: Secondary | ICD-10-CM | POA: Diagnosis not present

## 2017-08-02 HISTORY — PX: PH IMPEDANCE STUDY: SHX5565

## 2017-08-02 HISTORY — PX: ESOPHAGEAL MANOMETRY: SHX5429

## 2017-08-02 SURGERY — MANOMETRY, ESOPHAGUS
Anesthesia: Topical

## 2017-08-02 MED ORDER — LIDOCAINE VISCOUS 2 % MT SOLN
OROMUCOSAL | Status: AC
  Filled 2017-08-02: qty 15

## 2017-08-02 SURGICAL SUPPLY — 2 items
FACESHIELD LNG OPTICON STERILE (SAFETY) IMPLANT
GLOVE BIO SURGEON STRL SZ8 (GLOVE) ×4 IMPLANT

## 2017-08-02 NOTE — Progress Notes (Signed)
Esophageal Manometry and 24 hr Ph study done per protocol.  Pt. Tolerated both procedures well, w'o complication.  Pt. Verbalized understanding of education given regarding Ph study and diary.  Pt. To return tomorrow for removal of the Ph probe.  Omelia Blackwater, RN

## 2017-08-02 NOTE — Telephone Encounter (Signed)
Patient notified to resume her zantac and pantoprazole but continue to hold carafate until the pH probe has been removed.

## 2017-08-03 ENCOUNTER — Encounter (HOSPITAL_COMMUNITY): Payer: Self-pay | Admitting: Gastroenterology

## 2017-08-06 DIAGNOSIS — K219 Gastro-esophageal reflux disease without esophagitis: Secondary | ICD-10-CM | POA: Diagnosis not present

## 2017-08-16 ENCOUNTER — Telehealth: Payer: Self-pay | Admitting: Internal Medicine

## 2017-08-16 NOTE — Telephone Encounter (Signed)
Patient states she never received results from procedure on 10.1.18 and "the thing she had to wear after that".

## 2017-08-16 NOTE — Telephone Encounter (Signed)
The esophagus lower sphincter is a bit weak - low resting pressure but it works well overall. The pH/impedance test showed no significant reflux on the meds and during the study.  How has she been doing?

## 2017-08-16 NOTE — Telephone Encounter (Signed)
Patient reports that she is still having reflux that the acid "is coming up in my mouth".  Has happened 4 times in the last 2 weeks.  Once while brushing my teeth, 2 times while she was asleep and woke her up.

## 2017-08-16 NOTE — Telephone Encounter (Signed)
Please review pH probe and mano that are scanned in and advise.

## 2017-08-17 ENCOUNTER — Other Ambulatory Visit: Payer: Self-pay

## 2017-08-17 MED ORDER — METOCLOPRAMIDE HCL 5 MG PO TABS: 5.0000 mg | ORAL_TABLET | Freq: Every day | ORAL | 1 refills | Status: DC

## 2017-08-17 NOTE — Telephone Encounter (Signed)
OK  1) I want her to try Reglan 5 mg qhs #30 1 Refill 2) Needs an appointment with me to see how this helps (or not) and to discuss other treatment options (will explain surgical options to fix hiatal hernia) 3) Am ok with sliding her in somewhere in November 4) If she does not have head of bed elevated - needs to do so 4-6 inches or use a wedge 5) Absolutely nothing except meds and some water at least 3 hrs before bedtime

## 2017-08-17 NOTE — Telephone Encounter (Signed)
Patient notified of the results and recommendations  rx sent Follow up arranged for Nov.  She verbalized understanding of instructions not to eat or drink anything except a sip of water with medications 3 hours before HS

## 2017-09-17 ENCOUNTER — Ambulatory Visit (INDEPENDENT_AMBULATORY_CARE_PROVIDER_SITE_OTHER): Payer: Medicare Other | Admitting: Internal Medicine

## 2017-09-17 ENCOUNTER — Encounter: Payer: Self-pay | Admitting: Internal Medicine

## 2017-09-17 VITALS — BP 128/74 | HR 65 | Ht 65.0 in | Wt 173.0 lb

## 2017-09-17 DIAGNOSIS — R490 Dysphonia: Secondary | ICD-10-CM

## 2017-09-17 DIAGNOSIS — K219 Gastro-esophageal reflux disease without esophagitis: Secondary | ICD-10-CM

## 2017-09-17 DIAGNOSIS — K449 Diaphragmatic hernia without obstruction or gangrene: Secondary | ICD-10-CM | POA: Diagnosis not present

## 2017-09-17 NOTE — Progress Notes (Signed)
[name] 76 y.o. 1941/06/03 [id number]  Assessment & Plan:   Encounter Diagnoses  Name Primary?  . Gastroesophageal reflux disease, esophagitis presence not specified Yes  . Hiatal hernia   . Hoarseness      She has had a long history of symptoms consistent with reflux.  Impedance and pH testing on acid blocking medicines was negative for symptom correlation and significant reflux but she clearly describes reflux episodes.  It is possible that was just missed on a 24-hour study.  I have explained to her that the only treatment left that I can think of would be to consider a fundoplication.  She does have a hiatal hernia and repair of that might work.  However there is no guarantee.  I do think there is enough evidence that she has reflux that it is appropriate to consider that and to see if she could have her symptoms controlled and come off medication.  I do want to make sure that her stomach empties normally so we will do a gastric emptying study.  She will be referred to Dr. Wenda LowMatt Martin to discuss repair of her hiatal hernia and fundoplication surgery as a possible way to treat her problems.  I appreciate the opportunity to care for this patient. CC: Couillard, Victorino DikeJennifer, PA-C Dr. Wenda LowMatt Martin  Subjective:   Chief Complaint: Reflux sore throat cough  HPI The patient is here for follow-up, over the years I have evaluated her and treated her for reflux.  She has a lot of upper airway symptoms.  More recently an esophageal manometry was normal except for slightly decreased lower esophageal sphincter tone but the pH impedance study that she did was without significant reflux on acid suppression.  I have tried her on metoclopramide 5 mg at bedtime and she does have the head of bed elevated but she still has reflux episodes at night with description of hot burning fluid coming up into her mouth.  She does not have dysphagia.  She had an EGD in 2014 showing a hiatal hernia  but no esophagitis.  I believe she was on medication then.  She has had a barium swallow with some tertiary contractions.  It shows a hiatal hernia as well. Allergies  Allergen Reactions  . Citalopram     Other reaction(s): Serum Sickness (ALLERGY) Serotonin Syndrome  . Codeine Other (See Comments)    hallucinations  . Cymbalta [Duloxetine Hcl] Other (See Comments)    Serotonin Syndrome  . Metformin And Related     Causes dehydration and causes me to go to the hospital  . Paroxetine Hcl     Serotonin Syndrome  . Trazodone And Nefazodone Other (See Comments)    Serotonin Syndrome  . Caffeine Anxiety    Nervousness    Current Meds  Medication Sig  . amLODipine (NORVASC) 2.5 MG tablet Take 1 tablet (2.5 mg total) by mouth daily.  Marland Kitchen. atorvastatin (LIPITOR) 80 MG tablet Take 1 tablet (80 mg total) by mouth every evening.  Marland Kitchen. buPROPion (WELLBUTRIN XL) 300 MG 24 hr tablet Take 300 mg by mouth daily before breakfast.  . clonazePAM (KLONOPIN) 0.5 MG tablet Take 0.5-1 mg by mouth 2 (two) times daily. 0.5 mg at lunchtime daily and 1 mg at night  . clopidogrel (PLAVIX) 75 MG tablet Take 1 tablet (75 mg total) by mouth daily.  . Coenzyme Q10 200 MG capsule Take 200 mg by mouth daily with lunch.  . cyanocobalamin 500 MCG tablet Take 1,000 mcg by  mouth daily with lunch. Vitamin B12  . doxepin (SINEQUAN) 10 MG capsule Take 1 capsule by mouth at bedtime.  . gabapentin (NEURONTIN) 300 MG capsule TK ONE C PO TID  . glipiZIDE (GLUCOTROL XL) 5 MG 24 hr tablet daily with breakfast.   . glucose blood (ONE TOUCH ULTRA TEST) test strip AS DIRECTED TWICE DAILY  . hydrochlorothiazide (HYDRODIURIL) 25 MG tablet Take 25 mg by mouth daily.  Marland Kitchen losartan (COZAAR) 100 MG tablet Take 1 tablet (100 mg total) by mouth daily. Patient needs to call our office to schedule an appointment with Dr Swaziland for future refills.  . Multiple Vitamins-Minerals (ANTIOXIDANT PO) Take 1 tablet by mouth daily with lunch.  . Omega-3  Fatty Acids (FISH OIL PO) Take 2 capsules by mouth daily with lunch.  Marland Kitchen OVER THE COUNTER MEDICATION Take 2 tablets by mouth daily with lunch. Circulation and Vein Support by Feliberto Harts  . pantoprazole (PROTONIX) 40 MG tablet Take 1 tablet (40 mg total) by mouth 2 (two) times daily before a meal.  . ranitidine (ZANTAC) 150 MG tablet Take 1 tab by mouth daily.  . sucralfate (CARAFATE) 1 g tablet TAKE 1 TABLET(1 GRAM) BY MOUTH FOUR TIMES DAILY AT BEDTIME WITH MEALS  . tiZANidine (ZANAFLEX) 2 MG tablet Take 2 mg by mouth.  . [DISCONTINUED] metoCLOPramide (REGLAN) 5 MG tablet Take 1 tablet (5 mg total) by mouth at bedtime.   Past Medical History:  Diagnosis Date  . Allergy   . Arthritis   . Cardiac arrhythmia    now has pacemaker  . Cataract   . Diverticulosis   . DM (diabetes mellitus) (HCC)   . Fibromyalgia   . GERD (gastroesophageal reflux disease)   . Hyperlipidemia   . Hypertension   . Internal and external hemorrhoids without complication   . Myocardial infarction (HCC)   . Potassium deficiency    Past Surgical History:  Procedure Laterality Date  . ABDOMINAL HYSTERECTOMY    . APPENDECTOMY    . bil carple tunnel surgery    . COLONOSCOPY  multiple  . ESOPHAGEAL MANOMETRY (EM) N/A 08/02/2017   Performed by Napoleon Form, MD at [facility] ENDOSCOPY  . KNEE SURGERY     right x 2  . Left Heart Cath and Coronary Angiography N/A 04/09/2015   Performed by Swaziland, Peter M, MD at [facility] INVASIVE CV LAB  . NECK SURGERY     x2  . PACEMAKER INSERTION    . PH IMPEDANCE STUDY N/A 08/02/2017   Performed by Napoleon Form, MD at [facility] ENDOSCOPY  . PPM Generator Changeout N/A 04/09/2016   Performed by Marinus Maw, MD at [facility] INVASIVE CV LAB  . UPPER GASTROINTESTINAL ENDOSCOPY     Social History   Socioeconomic History  . Marital status: Married    Spouse name: Fayrene Fearing  . Number of children: 2  . Years of education: 75  . Highest education level: Not on file                          Occupational History  . Occupation: Retired    Associate Professor: RETIRED  Tobacco Use  . Smoking status: Never Smoker  . Smokeless tobacco: Never Used  Substance and Sexual Activity  . Alcohol use: No    Alcohol/week: 0.0 oz  . Drug use: No  . Sexual activity: Not Currently  Other Topics Concern  . Not on file  Social History Narrative   Patient  is married with 2 children.   Patient is right handed.   Patient has 12 th grade education.   Patient does not drink caffeine.   family history includes Breast cancer in her maternal aunt; Colon cancer in her brother, father, and sister; Colon polyps in her sister; Coronary artery disease in her father; Crohn's disease in her sister; Dementia in her daughter and mother; Diabetes in her cousin, daughter, father, maternal aunt, and maternal uncle; Heart attack in her brother, daughter, and father; Irritable bowel syndrome in her sister; Kidney disease in her daughter; Lung cancer in her father and mother; Prostate cancer in her father.   Review of Systems As per HPI  Objective:   Physical Exam BP 128/74   Pulse 65   Ht 5\' 5"  (1.651 m)   Wt 173 lb (78.5 kg)   BMI 28.79 kg/m  She is in no acute distress   15 minutes time spent with patient > half in counseling coordination of care

## 2017-09-17 NOTE — Patient Instructions (Addendum)
    Please stop taking metaclopramide.  We will schedule a consultation with [facility] to consider repairing the hiatal hernia.   We will also schedule a gastric emptying scan.  You have [facility] on 10/07/17 at 7:30AM. Please arrive at least 15 minutes prior to your appointment for registration. Please make certain not to have anything to eat or drink after midnight the night before your test. Hold all stomach medications (ex: Zofran, phenergan, Reglan) 48 hours prior to your test. If you need to reschedule your appointment, please contact radiology scheduling at [phone]. _____________________________________________________________________ A gastric-emptying study measures how long it takes for food to move through your stomach. There are several ways to measure stomach emptying. In the most common test, you eat food that contains a small amount of radioactive material. A scanner that detects the movement of the radioactive material is placed over your abdomen to monitor the rate at which food leaves your stomach. This test normally takes about 4 hours to complete. _____________________________________________________________________  I appreciate the opportunity to care for you. Stan Headarl Margi Edmundson, MD, [facility]

## 2017-09-21 ENCOUNTER — Telehealth: Payer: Self-pay | Admitting: Neurology

## 2017-09-21 NOTE — Telephone Encounter (Signed)
Pt called with new onset of dizziness, "wobbly", difficulty walking. Said she has seen eye Dr and was cleared, eye Dr told her she would send information to Dr [name]. Pt wanted to make appt to see Dr [name], I advised the pt to see PCP. Pt said she would go to the ED if she got worse. FYI

## 2017-09-21 NOTE — Telephone Encounter (Signed)
I have an open slot next Monday 11/26 at 8:30am. I can see her then, please see whether she can come. Thanks.   Marvel PlanJindong Takirah Binford, MD PhD Stroke Neurology 09/21/2017 10:36 PM

## 2017-09-22 NOTE — Telephone Encounter (Signed)
Rn call patient to schedule her with Dr. [name]. PT stated she uses her walker or cane when needed. She saw the eye doctor, and her report is good. Pt has history of diabetes. PT will check in at 0800am for 0830 appt. Pt aware of appt.

## 2017-09-27 ENCOUNTER — Ambulatory Visit (INDEPENDENT_AMBULATORY_CARE_PROVIDER_SITE_OTHER): Payer: Medicare Other | Admitting: Neurology

## 2017-09-27 ENCOUNTER — Encounter: Payer: Self-pay | Admitting: Neurology

## 2017-09-27 VITALS — BP 142/80 | HR 71 | Ht 65.0 in | Wt 172.0 lb

## 2017-09-27 DIAGNOSIS — G4700 Insomnia, unspecified: Secondary | ICD-10-CM

## 2017-09-27 DIAGNOSIS — F32A Depression, unspecified: Secondary | ICD-10-CM

## 2017-09-27 DIAGNOSIS — F329 Major depressive disorder, single episode, unspecified: Secondary | ICD-10-CM | POA: Diagnosis not present

## 2017-09-27 DIAGNOSIS — F411 Generalized anxiety disorder: Secondary | ICD-10-CM | POA: Diagnosis not present

## 2017-09-27 NOTE — Progress Notes (Signed)
NEUROLOGY CLINIC FOLLOW UP PATIENT NOTE  NAME: [name] DOB: 1941/07/06  History taken from chart and pt  She was accompanied by no one today in clinic.  History summary: [name] is a 76 y.o. female with PMH of HTN, DM, sinus node dysfunction s/p acemaker who presents on 08/10/14 as a new patient for whole body shakiness. Pt stated that she had a daughter with hx of early onset dementia, multiple strokes, seizure, WM disease, DM, failed kidney transplant, uterus cancer s/p hysterectomy, bedridden. She had been taking care of her for the last 30 years. Daughter eventually died in 2014-04-01 at age of 46. She was depressed and was put on paxil and cymbalta about 3-4 months ago. She was also put on xanax at night for sleep. For the last 6-8 weeks also, she noticed that she stated to have shakiness of her body, gradually getting worse, more hyperactivity and shaky, muscle jumping, feeling of on-the-go. After Xanax, she could sleep for few hours and then woke up in the middle of night, shaking all over. For the last week, it was way worse and she woke up at night feel chest tightness and she afraid of heart attack. She went to ER on 08/08/14 and had CT done which was negative for acute abnormalities and troponin negative. She was told to follow up with neurology as outpt. She denies any weaknss, numbness, LOC, seizure like activity, speech or vision changes.  She has hx of HTN on lisinopril, norvasc, and HCTZ. Her BP today is 123/65. She is also on amaryl for DM. She has FM of dementia. Besides her daughter, her two sisters had dementia and died at their 34s. She denies smoking, alcohol or illicit drugs.  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 Her paxil and cymbalta were stopped along with Xanax, and I put her on clonazepam for treatment.   Follow up 09/03/14 - she was doing well. The shakiness of body was gone, she felt more relaxed and no more muscle jumping. She was able  to sleep well. She tolerated clonazepam well and stated it helped her anxiety too. Her blood testing including A1C, LDL and TSH, free T4 were all normal. She stated that she still has intermittent numbness and tingling feelings in her fingers and toes. She has not been on gabapentin before. She stated that her sugar level is not in excellent control, ranging from 160s to 200s but her A1C last time we checked was 6.0, indicating good control. Her BP today is 148/81.   07/24/15 follow up - she had been doing well until this 04/02/23 when she developed chest discomfort and dyspnea and orthopnea. In the past, pt has been following with Dr. Lovena Le in EP for sinus node dysfunction and had pacemaker placement in 2012. Therefore, she was seen by cardiology and had several tests done. Her nuclear stress test done on 04/02/15 showed abnormal and indicating anterior wall ischemia. She then had 2D echo done showed EF 60-65% and cardiac cath done showed almost no stenosis of coronary arteries with normal LV function and normal EDP. BNP is normal. She was then considered non-cardiac related symptoms.   By detailed questioning, she admitted that she is depressed with anxiety. She still not able to get over with her daughter's death one year ago. She regret that she made several mistakes over her cemetery and miss her everyday. She is in tears throughout the clinic visit. She stated that she has a son who never  contacted her since long time ago and denied her visit to her granddaughter although the son's family lives just in [place]. She married to her husband who is 88 yo and not father of her son and daughter. 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 She feels bored and empty.   Follow up 11/15/15 - pt had psychiatry follow up and was put on wellbutrin and increased Klonopin dose for depression and anxiety. She felt somehow better symptoms. She has appointment again with psychiatrist on  12/17/15.  However, she stated that about 2 weeks ago, she was talking with friend and had sudden onset right arm numbness, weakness, not able to move right arm, not able to walk well due to right leg weakness and leaning towards right on walking. Able to talk, no facial involvement. Symptoms lasted about 30-46mn and resolved. Her CT in 05/2014 questionable for right frontal WM infarct. Not able to have MRI due to pacemaker. She stated that she was told to have afib during stress test last year by Dr. TLovena Le However, her cardiologist Dr. STamala Julianhas not find any documentation for afib. Pt has appointment with Dr. TLovena Lenext week   Follow up 12/10/15 - the patient has CTA head and neck which were unremarkable. Follow up with Dr. TLovena Lein cardiology, had pacemaker interrogation which did not show any A. fib episodes as per patient. Recheck LDL 82 and A1c 6.4. Blood pressure 130/70 in clinic today.  However, patient complains of insomnia. She had no problem of falling into sleep, however, has problem with maintaining sleep. She usually wakes up at 2am and not able to get back to sleep. Follow up with psychiatry and psychology, not effective. Still on Wellbutrin and Klonopin for depression and anxiety.  Admission 05/10/16 - pt admitted for HA and transient visual changes due to concern of TIA. However, the symptoms clearly more consistent with complicated migraine. MRI, CTA head and neck unremarkable. TTE EF 60-65%. Pacemaker interrogation no afib. LDL 84 and A1C 5.8. ESR and CRP normal. Continued plavix and lipitor. Also added nortriptyline for migraine prevention.   Interval history: During the interval time, pt stated that she had multiple issues going on recently.  She has hiatal hernia, follow with GI for GERD, requested more tests and possible surgery in the future.  She was quite worried about that, but feeling better recently.  She also feels nervous and anxious, tremors in the hands today, and night she  had a jerking movement in legs during sleep.  She has insomnia, only sleeps 2 hours/day, because she has too many thoughts at night keeping her not sleep.  She started to have trouble walking, imbalance, had 2 falls for the last 2 weeks, hitting his head on the top and on the right.  She also felt a little dizziness, sometimes has to hold onto things while walking in room.  She has emotional changes, get on her husband for no reasons, upsetting herself, difficulty getting her thoughts straight in church, felt very embarrassing.  She also has a lot of worry about her husband, who became more forgetful, however does not want to sell his farm at home, and to go to independent living with her.  They are hiring nursing aide at home soon.  She also stated that she follows with Dr. PJudieth Keensin psychiatry, last visit 3-4 months ago, and plan to taper off clonazepam, however patient against it the plan.  She was not very happy about it.  She also saw Jeani [name] for psychological counseling, and she felt very helpful.  She stated that she had a good relationship regards her son now, and she was not stressed about her son.  She had appointment with Dr. Judieth Keens today at 11am BP today 142/80 activity change, fatigue.   Past Medical History:  Diagnosis Date  . Allergy   . Arthritis   . Cardiac arrhythmia    now has pacemaker  . Cataract   . Diverticulosis   . DM (diabetes mellitus) ([place])   . Fibromyalgia   . GERD (gastroesophageal reflux disease)   . Hyperlipidemia   . Hypertension   . Internal and external hemorrhoids without complication   . Myocardial infarction ([place])   . Potassium deficiency    Past Surgical History:  Procedure Laterality Date  . ABDOMINAL HYSTERECTOMY    . APPENDECTOMY    . bil carple tunnel surgery    . CARDIAC CATHETERIZATION N/A 04/09/2015   Procedure: Left Heart Cath and Coronary Angiography;  Surgeon: Peter M Martinique, MD;  Location: [facility];  Service: Cardiovascular;   Laterality: N/A;  . COLONOSCOPY  multiple  . EP IMPLANTABLE DEVICE N/A 04/09/2016   Procedure: PPM Generator Changeout;  Surgeon: Evans Lance, MD;  Location: [facility];  Service: Cardiovascular;  Laterality: N/A;  . ESOPHAGEAL MANOMETRY N/A 08/02/2017   Procedure: ESOPHAGEAL MANOMETRY (EM);  Surgeon: Mauri Pole, MD;  Location: [facility];  Service: Endoscopy;  Laterality: N/A;  . KNEE SURGERY     right x 2  . NECK SURGERY     x2  . PACEMAKER INSERTION    . [place] IMPEDANCE STUDY N/A 08/02/2017   Procedure: [place] IMPEDANCE STUDY;  Surgeon: Mauri Pole, MD;  Location: [facility];  Service: Endoscopy;  Laterality: N/A;  . UPPER GASTROINTESTINAL ENDOSCOPY     Family History  Problem Relation Age of Onset  . Coronary artery disease Father   . Prostate cancer Father   . Colon cancer Father   . Lung cancer Father   . Diabetes Father   . Heart attack Father   . Diabetes Daughter   . Dementia Daughter   . Colon cancer Sister        x 2  . Lung cancer Mother   . Dementia Mother   . Colon polyps Sister   . Crohn's disease Sister   . Diabetes Maternal Aunt        multiple  . Breast cancer Maternal Aunt   . Colon cancer Brother   . Kidney disease Daughter   . Diabetes Cousin        multiple  . Diabetes Maternal Uncle        multiple  . Irritable bowel syndrome Sister   . Heart attack Daughter   . Heart attack Brother   . Hypertension Neg Hx   . Esophageal cancer Neg Hx   . Rectal cancer Neg Hx   . Stomach cancer Neg Hx    Current Outpatient Medications  Medication Sig Dispense Refill  . amLODipine (NORVASC) 2.5 MG tablet Take 1 tablet (2.5 mg total) by mouth daily. 90 tablet 2  . atorvastatin (LIPITOR) 80 MG tablet Take 1 tablet (80 mg total) by mouth every evening. 30 tablet 1  . buPROPion (WELLBUTRIN XL) 300 MG 24 hr tablet Take 300 mg by mouth daily before breakfast.    . clonazePAM (KLONOPIN) 0.5 MG tablet Take 0.5-1 mg  by mouth 2 (two) times daily.  0.5 mg at lunchtime daily and 1 mg at night    . clopidogrel (PLAVIX) 75 MG tablet Take 1 tablet (75 mg total) by mouth daily. 30 tablet 11  . Coenzyme Q10 200 MG capsule Take 200 mg by mouth daily with lunch.    . cyanocobalamin 500 MCG tablet Take 1,000 mcg by mouth daily with lunch. Vitamin B12    . doxepin (SINEQUAN) 10 MG capsule Take 1 capsule by mouth at bedtime.    . gabapentin (NEURONTIN) 300 MG capsule TK ONE C PO TID  1  . glipiZIDE (GLUCOTROL XL) 5 MG 24 hr tablet daily with breakfast.     . glucose blood (ONE TOUCH ULTRA TEST) test strip AS DIRECTED TWICE DAILY    . hydrochlorothiazide (HYDRODIURIL) 25 MG tablet Take 25 mg by mouth daily.    Marland Kitchen losartan (COZAAR) 100 MG tablet Take 1 tablet (100 mg total) by mouth daily. Patient needs to call our office to schedule an appointment with Dr Martinique for future refills. 90 tablet 0  . Multiple Vitamins-Minerals (ANTIOXIDANT PO) Take 1 tablet by mouth daily with lunch.    . Omega-3 Fatty Acids (FISH OIL PO) Take 2 capsules by mouth daily with lunch.    Marland Kitchen OVER THE COUNTER MEDICATION Take 2 tablets by mouth daily with lunch. Circulation and Vein Support by Heber Barlow    . OVER THE COUNTER MEDICATION     . OVER THE COUNTER MEDICATION     . pantoprazole (PROTONIX) 40 MG tablet Take 1 tablet (40 mg total) by mouth 2 (two) times daily before a meal. 60 tablet 2  . ranitidine (ZANTAC) 150 MG tablet Take 1 tab by mouth daily. 30 tablet 3  . sucralfate (CARAFATE) 1 g tablet TAKE 1 TABLET(1 GRAM) BY MOUTH FOUR TIMES DAILY AT BEDTIME WITH MEALS 360 tablet 1  . tiZANidine (ZANAFLEX) 2 MG tablet Take 2 mg by mouth.     No current facility-administered medications for this visit.    Allergies  Allergen Reactions  . Citalopram     Other reaction(s): Serum Sickness (ALLERGY) Serotonin Syndrome  . Codeine Other (See Comments)    hallucinations  . Cymbalta [Duloxetine Hcl] Other (See Comments)    Serotonin Syndrome  . Metformin And Related      Causes dehydration and causes me to go to the hospital  . Paroxetine Hcl     Serotonin Syndrome  . Trazodone And Nefazodone Other (See Comments)    Serotonin Syndrome  . Caffeine Anxiety    Nervousness    Social History   Socioeconomic History  . Marital status: Married    Spouse name: [name]  . Number of children: 2  . Years of education: 42  . Highest education level: Not on file  Social Needs  . Financial resource strain: Not on file  . Food insecurity - worry: Not on file  . Food insecurity - inability: Not on file  . Transportation needs - medical: Not on file  . Transportation needs - non-medical: Not on file  Occupational History  . Occupation: Retired    Fish farm manager: RETIRED  Tobacco Use  . Smoking status: Never Smoker  . Smokeless tobacco: Never Used  Substance and Sexual Activity  . Alcohol use: No    Alcohol/week: 0.0 oz  . Drug use: No  . Sexual activity: Not Currently  Other Topics Concern  . Not on file  Social History Narrative   Patient is  married with 2 children.   Patient is right handed.   Patient has 12 th grade education.   Patient does not drink caffeine.    Review of Systems Full 14 system review of systems performed and notable only for those listed, all others are neg:  Constitutional: Activity change, fatigue Cardiovascular:   Ear/Nose/Throat: N/A  Skin: N/A  Eyes:   Respiratory:   Gastroitestinal:   Hematology/Lymphatic:   Endocrine:  Musculoskeletal: Joint pain, joint swelling, walking difficulty Allergy/Immunology: N/A  Neurological: Dizziness, headache, numbness, seizure, speech difficulty, weakness, tremor Psychiatric: Depression, nervous, anxious Sleep: insomnia   Physical Exam  Vitals:   09/27/17 0818  BP: (!) 142/80  Pulse: 71    General - Well nourished, well developed, in no acute distress, however anxious with depressed mood, intermittent crying.  Ophthalmologic - fundi not visualized due to  noncorporation.  Cardiovascular - Regular rate and rhythm with no murmur. Carotid pulses were 2+ without bruits .   Neck - supple, no nuchal rigidity.  Mental Status -  Level of arousal and orientation to time, place, and person were intact. Language including expression, naming, repetition, comprehension, reading, and writing was assessed and found intact.  Cranial Nerves II - XII - II - Visual field intact OU. III, IV, VI - Extraocular movements intact. V - Facial sensation intact bilaterally. VII - Facial movement intact bilaterally.  VIII - Hearing & vestibular intact bilaterally. X - Palate elevates symmetrically. XI - Chin turning & shoulder shrug intact bilaterally. XII - Tongue protrusion intact.  Motor Strength - The patient's strength was normal in all extremities and pronator drift was absent.  Bulk was normal and fasciculations were absent.   Motor Tone - Muscle tone was assessed at the neck and appendages and was normal.  Reflexes - The patient's reflexes were normal in all extremities and she had no pathological reflexes.  Sensory - Light touch, temperature/pinprick were assessed and were normal.    Coordination - The patient had normal movements in the hands and feet with no ataxia or dysmetria.   Gait and Station - walk with cane, cautious gait, slow, but steady   Imaging CT head 08/08/14 -  1. No acute intracranial abnormalities.  2. Nonspecific area of low attenuation at the right white matter  junction the posterior right frontal lobe is likely related to prior  infarct. In light of the patient's current symptoms, a more  definitive workup with contrast enhanced MRI of the brain may be  Helpful.  Nuclear stress test 04/02/15 Myocardial perfusion is abnormal. Moderate size and intensity, reversible anterior perfusion defect. Findings consistent with ischemia. This is an intermediate risk study. Overall left ventricular systolic function was normal. LV cavity  size is normal. The left ventricular ejection fraction is hyperdynamic (>65%). There is no prior study for comparison.  2-D echo - Left ventricle: The cavity size was normal. Wall thickness was normal. Systolic function was normal. The estimated ejection fraction was in the range of 60% to 65%. Doppler parameters are consistent with abnormal left ventricular relaxation (grade 1 diastolic dysfunction). - Mitral valve: There was mild regurgitation. - Left atrium: The atrium was mildly dilated.  Cardiac cath  Prox RCA lesion, 10% stenosed.  Prox LAD lesion, 25% stenosed. 1. Nonobstructive CAD- mild 2. Normal LV function 3. Normal LVEDP  CTA head and neck 11/22/2015 No evidence of stenosis of either carotid artery. Slight irregularity and possible mild narrowing vertebral arteries at the level of the foramen magnum however, streak  artifact from dental work may contribute to this appearance.  TTE 05/11/16 - Left ventricle: The cavity size was normal. Wall thickness was   normal. Systolic function was normal. The estimated ejection   fraction was in the range of 60% to 65%. Wall motion was normal;   there were no regional wall motion abnormalities. The study is   not technically sufficient to allow evaluation of LV diastolic   function. - Right ventricle: Pacer wire or catheter noted in right ventricle. Impressions: - No cardiac source of emboli was indentified.  MRI 05/10/16 No acute intracranial abnormality and largely unremarkable for age noncontrast MRI appearance of the brain. Mild for age nonspecific white matter changes.  CTA head and neck 05/10/16 1. Stable since January and largely normal for age CTA Head and Neck. There is stable bilateral distal vertebral artery atherosclerosis and mild stenosis. Little to no carotid plaque and No carotid stenosis. Normal ophthalmic artery origins. 2. Continued stable CT appearance of the brain.   Lab Review  Component      Latest Ref Rng 08/10/2014 03/14/2015  Cholesterol, Total     100 - 199 mg/dL 158   Triglycerides     <150 mg/dL 135 153 (H)  HDL Cholesterol     >=46 mg/dL 57 54  VLDL Cholesterol Cal     5 - 40 mg/dL 27   LDL (calc)     0 - 99 mg/dL 74 113 (H)  Total CHOL/HDL Ratio      2.8 3.7  Cholesterol     0 - 200 mg/dL  198  VLDL     0 - 40 mg/dL  31  TSH     0.450 - 4.500 uIU/mL 2.080   Free T4     0.82 - 1.77 ng/dL 1.39   Hemoglobin A1C     4.8 - 5.6 % 6.0 (H)   Est. average glucose Bld gHb Est-mCnc      126   Brain Natriuretic Peptide     0.0 - 100.0 pg/mL  84.7   Component     Latest Ref Rng 11/15/2015  Cholesterol, Total     100 - 199 mg/dL 158  Triglycerides     0 - 149 mg/dL 130  HDL Cholesterol     >39 mg/dL 50  VLDL Cholesterol Cal     5 - 40 mg/dL 26  LDL (calc)     0 - 99 mg/dL 82  Total CHOL/HDL Ratio     0.0 - 4.4 ratio units 3.2  Hemoglobin A1C     4.8 - 5.6 % 6.4 (H)  Est. average glucose Bld gHb Est-mCnc      137   Component     Latest Ref Rng & Units 05/10/2016 05/11/2016  Cholesterol     0 - 200 mg/dL  161  Triglycerides     <150 mg/dL  134  HDL Cholesterol     >40 mg/dL  50  Total CHOL/HDL Ratio     RATIO  3.2  VLDL     0 - 40 mg/dL  27  LDL (calc)     0 - 99 mg/dL  84  Hemoglobin A1C     4.8 - 5.6 % 5.8 (H)   Mean Plasma Glucose     mg/dL 120   Sed Rate     0 - 22 mm/hr 11   CRP     <1.0 mg/dL <0.5     Assessment    In summary,  [name] is a 76 y.o. female with PMH of HTN, DM, sinus node dysfunction s/p pacemaker who is followed up in clinic for whole body shakiness. 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 After stopping her paxil and cymbalta as well as Xanax and put on clonazepam, her symptoms resolved. She stated that clonazepam also helped her anxiety. 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   During the interval time, she also had dyspnea, chest tightness and orthopnea. Cardiac workup negative. She admitted to have severe depression, anxiety over the death of her daughter and the relationship with her son and husband. She was referred to psychiatrist and put on wellbutrin and Klonopin.  However pt had transient right UE and LE numbness and weakness, lasted about 30-27mn and resolved. Her 05/2014 head CT concerning for right frontal embolic stroke. CTA head and neck unremarkable, LDL 82 and A1C 6.4. Pacemaker interrogation by Dr. TLovena Leshowed no afib as per patient. Switched from ASA 81 to plavix.   Admitted in 71/6109for complicated migraine episode. MRI, CTA h/n, TTE also negative. Continued plavix and lipitor. Also put on nortriptyline for HA prevention.  However, no AAA was not on her medication list today.  She complained of numerous symptoms today in clinic, all consistent with anxiety and stress with mild depression.  She has appointment with her psychologist and psychiatrist today.  Plan: - continue plavix and lipitor for stroke prevention - continue follow up with cardiology, psychology and psychiatry - keep your appointment with psychiatrist and psychologist today  - your symptoms are more related to anxiety, nervousness, stressful and worries.  - you need relaxation and de-stress, that is the key to help  - check BP at home.  - Follow up with your primary care physician for stroke risk factor modification. Recommend maintain blood pressure goal around130/80, diabetes with hemoglobin A1c goal below 6.5% and lipids with LDL cholesterol goal below 70 mg/dL.   - follow up in 3 months.   I spent more than 25 minutes of face to face time with the patient. Greater than 50% of time was spent in counseling and coordination of care. We discussed about relaxation and de-stress skills, and the follow-up with psychiatry and psychology.   No orders of the defined types were  placed in this encounter.   No orders of the defined types were placed in this encounter.   Patient Instructions  - continue plavix and lipitor for stroke prevention - continue follow up with cardiology, psychology and psychiatry - keep your appointment with psychiatrist and psychologist today  - your symptoms are more related to anxiety, nervousness, stressful and worries.  - you need relaxation and de-stress, that is the key to help you - check BP at home.  - Follow up with your primary care physician for stroke risk factor modification. Recommend maintain blood pressure goal around130/80, diabetes with hemoglobin A1c goal below 6.5% and lipids with LDL cholesterol goal below 70 mg/dL.   - follow up in 3 months.    JRosalin Hawking MD PhD [facility] [address] [place] [phone]

## 2017-09-27 NOTE — Patient Instructions (Addendum)
-   continue plavix and lipitor for stroke prevention - continue follow up with cardiology, psychology and psychiatry - keep your appointment with psychiatrist and psychologist today  - your symptoms are more related to anxiety, nervousness, stressful and worries.  - you need relaxation and de-stress, that is the key to help you - check BP at home.  - Follow up with your primary care physician for stroke risk factor modification. Recommend maintain blood pressure goal around130/80, diabetes with hemoglobin A1c goal below 6.5% and lipids with LDL cholesterol goal below 70 mg/dL.   - follow up in 3 months.

## 2017-10-02 ENCOUNTER — Telehealth: Payer: Self-pay

## 2017-10-02 NOTE — Telephone Encounter (Signed)
Patient has appointment with Dr Wenda LowMatt Martin on 10/20/2017 at 10:15 AM, to arrive at 9:45AM. Patient has been left a voice mail message by [facility] with this information.

## 2017-10-07 ENCOUNTER — Ambulatory Visit (HOSPITAL_COMMUNITY): Payer: Medicare Other

## 2017-10-14 ENCOUNTER — Ambulatory Visit (INDEPENDENT_AMBULATORY_CARE_PROVIDER_SITE_OTHER): Payer: Medicare Other | Admitting: *Deleted

## 2017-10-14 DIAGNOSIS — I495 Sick sinus syndrome: Secondary | ICD-10-CM

## 2017-10-14 NOTE — Progress Notes (Signed)
Remote pacemaker transmission.   

## 2017-10-15 LAB — CUP PACEART REMOTE DEVICE CHECK
Battery Remaining Longevity: 104 mo
Battery Voltage: 3.02 V
Brady Statistic AP VP Percent: 0.08 %
Brady Statistic AP VS Percent: 99.5 %
Brady Statistic AS VP Percent: 0 %
Brady Statistic AS VS Percent: 0.42 %
Brady Statistic RA Percent Paced: 99.56 %
Brady Statistic RV Percent Paced: 0.08 %
Date Time Interrogation Session: 20181213144231
Implantable Lead Implant Date: 20070910
Implantable Lead Implant Date: 20070910
Implantable Lead Location: 753859
Implantable Lead Location: 753860
Implantable Lead Model: 5076
Implantable Lead Model: 5076
Implantable Pulse Generator Implant Date: 20170608
Lead Channel Impedance Value: 323 Ohm
Lead Channel Impedance Value: 437 Ohm
Lead Channel Impedance Value: 475 Ohm
Lead Channel Impedance Value: 513 Ohm
Lead Channel Pacing Threshold Amplitude: 0.625 V
Lead Channel Pacing Threshold Amplitude: 0.875 V
Lead Channel Pacing Threshold Pulse Width: 0.4 ms
Lead Channel Pacing Threshold Pulse Width: 0.4 ms
Lead Channel Sensing Intrinsic Amplitude: 14 mV
Lead Channel Sensing Intrinsic Amplitude: 14 mV
Lead Channel Sensing Intrinsic Amplitude: 2.375 mV
Lead Channel Sensing Intrinsic Amplitude: 2.375 mV
Lead Channel Setting Pacing Amplitude: 2 V
Lead Channel Setting Pacing Amplitude: 2.5 V
Lead Channel Setting Pacing Pulse Width: 0.4 ms
Lead Channel Setting Sensing Sensitivity: 2.8 mV

## 2017-10-19 ENCOUNTER — Encounter: Payer: Self-pay | Admitting: Cardiology

## 2017-11-06 ENCOUNTER — Other Ambulatory Visit: Payer: Self-pay | Admitting: Internal Medicine

## 2017-11-08 NOTE — Telephone Encounter (Signed)
Okay to refill Sir? 

## 2017-11-08 NOTE — Telephone Encounter (Signed)
May refill x 1 year

## 2017-11-11 ENCOUNTER — Other Ambulatory Visit: Payer: Self-pay | Admitting: Gastroenterology

## 2017-11-11 ENCOUNTER — Other Ambulatory Visit: Payer: Self-pay | Admitting: Internal Medicine

## 2018-01-05 ENCOUNTER — Ambulatory Visit (INDEPENDENT_AMBULATORY_CARE_PROVIDER_SITE_OTHER): Payer: Medicare Other | Admitting: Neurology

## 2018-01-05 ENCOUNTER — Encounter: Payer: Self-pay | Admitting: Neurology

## 2018-01-05 VITALS — BP 147/76 | HR 73 | Ht 65.5 in | Wt 173.0 lb

## 2018-01-05 DIAGNOSIS — F411 Generalized anxiety disorder: Secondary | ICD-10-CM

## 2018-01-05 DIAGNOSIS — F329 Major depressive disorder, single episode, unspecified: Secondary | ICD-10-CM | POA: Diagnosis not present

## 2018-01-05 DIAGNOSIS — F32A Depression, unspecified: Secondary | ICD-10-CM

## 2018-01-05 DIAGNOSIS — G4700 Insomnia, unspecified: Secondary | ICD-10-CM

## 2018-01-05 NOTE — Patient Instructions (Signed)
-   continue plavix and lipitor for stroke prevention - continue follow up with psychology and psychiatry  - follow up with cardiology for pacemaker, if afib found, please let us know and we may need to change your medication - continue to relax and de-stress, which is important for daily life  - check BP at home.  - Follow up with your primary care physician for stroke risk factor modification. Recommend maintain blood pressure goal around130/80, diabetes with hemoglobin A1c goal below 6.5% and lipids with LDL cholesterol goal below 70 mg/dL.   - healthy diet and regular exercise.  - follow up as needed.

## 2018-01-05 NOTE — Progress Notes (Signed)
NEUROLOGY CLINIC FOLLOW UP PATIENT NOTE  NAME: [name] DOB: 1940-11-24  History taken from chart and pt  She was accompanied by no one today in clinic.  History summary: [name] is a 77 y.o. female with PMH of HTN, DM, sinus node dysfunction s/p acemaker who presents on 08/10/14 as a new patient for whole body shakiness. Pt stated that she had a daughter with hx of early onset dementia, multiple strokes, seizure, WM disease, DM, failed kidney transplant, uterus cancer s/p hysterectomy, bedridden. She had been taking care of her for the last 30 years. Daughter eventually died in 04/03/2014 at age of 51. She was depressed and was put on paxil and cymbalta about 3-4 months ago. She was also put on xanax at night for sleep. For the last 6-8 weeks also, she noticed that she stated to have shakiness of her body, gradually getting worse, more hyperactivity and shaky, muscle jumping, feeling of on-the-go. After Xanax, she could sleep for few hours and then woke up in the middle of night, shaking all over. For the last week, it was way worse and she woke up at night feel chest tightness and she afraid of heart attack. She went to ER on 08/08/14 and had CT done which was negative for acute abnormalities and troponin negative. She was told to follow up with neurology as outpt. She denies any weaknss, numbness, LOC, seizure like activity, speech or vision changes.  She has hx of HTN on lisinopril, norvasc, and HCTZ. Her BP today is 123/65. She is also on amaryl for DM. She has FM of dementia. Besides her daughter, her two sisters had dementia and died at their 39s. She denies smoking, alcohol or illicit drugs.  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 Her paxil and cymbalta were stopped along with Xanax, and I put her on clonazepam for treatment.   Follow up 09/03/14 - she was doing well. The shakiness of body was gone, she felt more relaxed and no more muscle jumping. She was able  to sleep well. She tolerated clonazepam well and stated it helped her anxiety too. Her blood testing including A1C, LDL and TSH, free T4 were all normal. She stated that she still has intermittent numbness and tingling feelings in her fingers and toes. She has not been on gabapentin before. She stated that her sugar level is not in excellent control, ranging from 160s to 200s but her A1C last time we checked was 6.0, indicating good control. Her BP today is 148/81.   07/24/15 follow up - she had been doing well until this Apr 04, 2023 when she developed chest discomfort and dyspnea and orthopnea. In the past, pt has been following with Dr. Lovena Le in EP for sinus node dysfunction and had pacemaker placement in 2012. Therefore, she was seen by cardiology and had several tests done. Her nuclear stress test done on 04/02/15 showed abnormal and indicating anterior wall ischemia. She then had 2D echo done showed EF 60-65% and cardiac cath done showed almost no stenosis of coronary arteries with normal LV function and normal EDP. BNP is normal. She was then considered non-cardiac related symptoms.   By detailed questioning, she admitted that she is depressed with anxiety. She still not able to get over with her daughter's death one year ago. She regret that she made several mistakes over her cemetery and miss her everyday. She is in tears throughout the clinic visit. She stated that she has a son who never  contacted her since long time ago and denied her visit to her granddaughter although the son's family lives just in [place]. She married to her husband who is 87 yo and not father of her son and daughter. 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 She feels bored and empty.   Follow up 11/15/15 - pt had psychiatry follow up and was put on wellbutrin and increased Klonopin dose for depression and anxiety. She felt somehow better symptoms. She has appointment again with psychiatrist on  12/17/15.  However, she stated that about 2 weeks ago, she was talking with friend and had sudden onset right arm numbness, weakness, not able to move right arm, not able to walk well due to right leg weakness and leaning towards right on walking. Able to talk, no facial involvement. Symptoms lasted about 30-84mn and resolved. Her CT in 05/2014 questionable for right frontal WM infarct. Not able to have MRI due to pacemaker. She stated that she was told to have afib during stress test last year by Dr. TLovena Le However, her cardiologist Dr. STamala Julianhas not find any documentation for afib. Pt has appointment with Dr. TLovena Lenext week   Follow up 12/10/15 - the patient has CTA head and neck which were unremarkable. Follow up with Dr. TLovena Lein cardiology, had pacemaker interrogation which did not show any A. fib episodes as per patient. Recheck LDL 82 and A1c 6.4. Blood pressure 130/70 in clinic today.  However, patient complains of insomnia. She had no problem of falling into sleep, however, has problem with maintaining sleep. She usually wakes up at 2am and not able to get back to sleep. Follow up with psychiatry and psychology, not effective. Still on Wellbutrin and Klonopin for depression and anxiety.  Admission 05/10/16 - pt admitted for HA and transient visual changes due to concern of TIA. However, the symptoms clearly more consistent with complicated migraine. MRI, CTA head and neck unremarkable. TTE EF 60-65%. Pacemaker interrogation no afib. LDL 84 and A1C 5.8. ESR and CRP normal. Continued plavix and lipitor. Also added nortriptyline for migraine prevention.   Follow up 09/27/17 - pt stated that she had multiple issues going on recently.  She has hiatal hernia, follow with GI for GERD, requested more tests and possible surgery in the future.  She was quite worried about that, but feeling better recently.  She also feels nervous and anxious, tremors in the hands today, and night she had a jerking movement  in legs during sleep.  She has insomnia, only sleeps 2 hours/day, because she has too many thoughts at night keeping her not sleep.  She started to have trouble walking, imbalance, had 2 falls for the last 2 weeks, hitting his head on the top and on the right.  She also felt a little dizziness, sometimes has to hold onto things while walking in room.  She has emotional changes, get on her husband for no reasons, upsetting herself, difficulty getting her thoughts straight in church, felt very embarrassing.  She also has a lot of worry about her husband, who became more forgetful, however does not want to sell his farm at home, and to go to independent living with her.  They are hiring nursing aide at home soon.  She also stated that she follows with Dr. PJudieth Keensin psychiatry, last visit 3-4 months ago, and plan to taper off clonazepam, however patient against it the plan.  She  was not very happy about it.  She also saw Jeani Hawking for psychological counseling, and she felt very helpful.  She stated that she had a good relationship regards her son now, and she was not stressed about her son.  She had appointment with Dr. Judieth Keens today at 11am BP today 142/80 activity change, fatigue.  Interval history: During the interval time, pt has been doing well.  She did not require GI procedure, currently much better symptoms.  Her walking much improved, no more imbalance, no fall.  For her jerking/tremor, she was put on tizanidine, symptom resolved.  She sleeps better now at night.  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  She still on clonazepam, and still follow-up with psychiatrist and psychologist.  BP today in clinic 147/76, however patient stated at home her blood pressure 110-120s.   Past Medical History:  Diagnosis Date  . Allergy   . Arthritis   . Cardiac arrhythmia    now has pacemaker  . Cataract   . Diverticulosis   . DM (diabetes mellitus) ([place])   .  Fibromyalgia   . GERD (gastroesophageal reflux disease)   . Hyperlipidemia   . Hypertension   . Internal and external hemorrhoids without complication   . Myocardial infarction ([place])   . Potassium deficiency    Past Surgical History:  Procedure Laterality Date  . ABDOMINAL HYSTERECTOMY    . APPENDECTOMY    . bil carple tunnel surgery    . CARDIAC CATHETERIZATION N/A 04/09/2015   Procedure: Left Heart Cath and Coronary Angiography;  Surgeon: Peter M Martinique, MD;  Location: [facility];  Service: Cardiovascular;  Laterality: N/A;  . COLONOSCOPY  multiple  . EP IMPLANTABLE DEVICE N/A 04/09/2016   Procedure: PPM Generator Changeout;  Surgeon: Evans Lance, MD;  Location: [facility];  Service: Cardiovascular;  Laterality: N/A;  . ESOPHAGEAL MANOMETRY N/A 08/02/2017   Procedure: ESOPHAGEAL MANOMETRY (EM);  Surgeon: Mauri Pole, MD;  Location: [facility];  Service: Endoscopy;  Laterality: N/A;  . KNEE SURGERY     right x 2  . NECK SURGERY     x2  . PACEMAKER INSERTION    . [place] IMPEDANCE STUDY N/A 08/02/2017   Procedure: [place] IMPEDANCE STUDY;  Surgeon: Mauri Pole, MD;  Location: [facility];  Service: Endoscopy;  Laterality: N/A;  . UPPER GASTROINTESTINAL ENDOSCOPY     Family History  Problem Relation Age of Onset  . Coronary artery disease Father   . Prostate cancer Father   . Colon cancer Father   . Lung cancer Father   . Diabetes Father   . Heart attack Father   . Diabetes Daughter   . Dementia Daughter   . Colon cancer Sister        x 2  . Lung cancer Mother   . Dementia Mother   . Colon polyps Sister   . Crohn's disease Sister   . Diabetes Maternal Aunt        multiple  . Breast cancer Maternal Aunt   . Colon cancer Brother   . Kidney disease Daughter   . Diabetes Cousin        multiple  . Diabetes Maternal Uncle        multiple  . Irritable bowel syndrome Sister   . Heart attack Daughter   . Heart attack Brother   . Hypertension Neg Hx    . Esophageal  cancer Neg Hx   . Rectal cancer Neg Hx   . Stomach cancer Neg Hx    Current Outpatient Medications  Medication Sig Dispense Refill  . amLODipine (NORVASC) 2.5 MG tablet Take 1 tablet (2.5 mg total) by mouth daily. 90 tablet 2  . atorvastatin (LIPITOR) 80 MG tablet Take 1 tablet (80 mg total) by mouth every evening. 30 tablet 1  . buPROPion (WELLBUTRIN XL) 300 MG 24 hr tablet Take 300 mg by mouth daily before breakfast.    . clonazePAM (KLONOPIN) 0.5 MG tablet Take 0.5-1 mg by mouth 2 (two) times daily. 0.5 mg at lunchtime daily and 1 mg at night    . clopidogrel (PLAVIX) 75 MG tablet Take 1 tablet (75 mg total) by mouth daily. 30 tablet 11  . Coenzyme Q10 200 MG capsule Take 200 mg by mouth daily with lunch.    . cyanocobalamin 500 MCG tablet Take 1,000 mcg by mouth daily with lunch. Vitamin B12    . doxepin (SINEQUAN) 10 MG capsule Take 1 capsule by mouth at bedtime.    . gabapentin (NEURONTIN) 300 MG capsule Take 600 mg by mouth 3 (three) times daily.    Marland Kitchen glipiZIDE (GLUCOTROL XL) 5 MG 24 hr tablet daily with breakfast.     . glucose blood (ONE TOUCH ULTRA TEST) test strip AS DIRECTED TWICE DAILY    . hydrochlorothiazide (HYDRODIURIL) 25 MG tablet Take 25 mg by mouth daily.    Marland Kitchen losartan (COZAAR) 100 MG tablet Take 1 tablet (100 mg total) by mouth daily. Patient needs to call our office to schedule an appointment with Dr Martinique for future refills. 90 tablet 0  . Multiple Vitamins-Minerals (ANTIOXIDANT PO) Take 1 tablet by mouth daily with lunch.    . Omega-3 Fatty Acids (FISH OIL PO) Take 2 capsules by mouth daily with lunch.    Marland Kitchen OVER THE COUNTER MEDICATION Take 2 tablets by mouth daily with lunch. Circulation and Vein Support by [facility] [place]    . OVER THE COUNTER MEDICATION Take 2 capsules by mouth daily. Vitamins for eyes    . OVER THE COUNTER MEDICATION Take 2 capsules by mouth daily. Friendly Dianah Field    . pantoprazole (PROTONIX) 40 MG tablet Take 1 tablet (40 mg total)  by mouth 2 (two) times daily before a meal. 60 tablet 2  . ranitidine (ZANTAC) 150 MG tablet TAKE 1 TABLET BY MOUTH DAILY 90 tablet 0  . sucralfate (CARAFATE) 1 g tablet TAKE 1 TABLET(1 GRAM) BY MOUTH FOUR TIMES DAILY AT BEDTIME WITH MEALS 120 tablet 11  . tiZANidine (ZANAFLEX) 2 MG tablet Take 2 mg by mouth as needed.      No current facility-administered medications for this visit.    Allergies  Allergen Reactions  . Citalopram     Other reaction(s): Serum Sickness (ALLERGY) Serotonin Syndrome  . Codeine Other (See Comments)    hallucinations  . Cymbalta [Duloxetine Hcl] Other (See Comments)    Serotonin Syndrome  . Metformin And Related     Causes dehydration and causes me to go to the hospital  . Paroxetine Hcl     Serotonin Syndrome  . Trazodone And Nefazodone Other (See Comments)    Serotonin Syndrome  . Caffeine Anxiety    Nervousness    Social History   Socioeconomic History  . Marital status: Married    Spouse name: Jeneen Rinks  . Number of children: 2  . Years of education: 47  . Highest education level: Not on file  Social Needs  . Financial resource strain: Not on file  . Food insecurity - worry: Not on file  . Food insecurity - inability: Not on file  . Transportation needs - medical: Not on file  . Transportation needs - non-medical: Not on file  Occupational History  . Occupation: Retired    Fish farm manager: RETIRED  Tobacco Use  . Smoking status: Never Smoker  . Smokeless tobacco: Never Used  Substance and Sexual Activity  . Alcohol use: No    Alcohol/week: 0.0 oz  . Drug use: No  . Sexual activity: Not Currently  Other Topics Concern  . Not on file  Social History Narrative   Patient is married with 2 children. She lives at home with her husband.    Patient is right handed.   Patient has 12 th grade education.   Patient drinks occasional caffeine.    Review of Systems Full 14 system review of systems performed and notable only for those listed, all  others are neg:  Constitutional: Activity change, fatigue Cardiovascular:   Ear/Nose/Throat: N/A  Skin: N/A  Eyes:   Respiratory:   Gastroitestinal:   Hematology/Lymphatic:   Endocrine:  Musculoskeletal: Joint pain, joint swelling, walking difficulty Allergy/Immunology: N/A  Neurological: Dizziness, headache, numbness, seizure, speech difficulty, weakness, tremor Psychiatric: Depression, nervous, anxious Sleep: insomnia   Physical Exam  Vitals:   01/05/18 0735  BP: (!) 147/76  Pulse: 73    General - Well nourished, well developed, in no acute distress, however anxious with depressed mood, intermittent crying.  Ophthalmologic - fundi not visualized due to noncorporation.  Cardiovascular - Regular rate and rhythm with no murmur. Carotid pulses were 2+ without bruits .   Neck - supple, no nuchal rigidity.  Mental Status -  Level of arousal and orientation to time, place, and person were intact. Language including expression, naming, repetition, comprehension, reading, and writing was assessed and found intact.  Cranial Nerves II - XII - II - Visual field intact OU. III, IV, VI - Extraocular movements intact. V - Facial sensation intact bilaterally. VII - Facial movement intact bilaterally.  VIII - Hearing & vestibular intact bilaterally. X - Palate elevates symmetrically. XI - Chin turning & shoulder shrug intact bilaterally. XII - Tongue protrusion intact.  Motor Strength - The patient's strength was normal in all extremities and pronator drift was absent.  Bulk was normal and fasciculations were absent.   Motor Tone - Muscle tone was assessed at the neck and appendages and was normal.  Reflexes - The patient's reflexes were normal in all extremities and she had no pathological reflexes.  Sensory - Light touch, temperature/pinprick were assessed and were normal.    Coordination - The patient had normal movements in the hands and feet with no ataxia or dysmetria.    Gait and Station - walk with cane, cautious gait, slow, but steady   Imaging CT head 08/08/14 -  1. No acute intracranial abnormalities.  2. Nonspecific area of low attenuation at the right white matter  junction the posterior right frontal lobe is likely related to prior  infarct. In light of the patient's current symptoms, a more  definitive workup with contrast enhanced MRI of the brain may be  Helpful.  Nuclear stress test 04/02/15 Myocardial perfusion is abnormal. Moderate size and intensity, reversible anterior perfusion defect. Findings consistent with ischemia. This is an intermediate risk study. Overall left ventricular systolic function was normal. LV cavity size is normal. The left ventricular ejection fraction is hyperdynamic (>  65%). There is no prior study for comparison.  2-D echo - Left ventricle: The cavity size was normal. Wall thickness was normal. Systolic function was normal. The estimated ejection fraction was in the range of 60% to 65%. Doppler parameters are consistent with abnormal left ventricular relaxation (grade 1 diastolic dysfunction). - Mitral valve: There was mild regurgitation. - Left atrium: The atrium was mildly dilated.  Cardiac cath  Prox RCA lesion, 10% stenosed.  Prox LAD lesion, 25% stenosed. 1. Nonobstructive CAD- mild 2. Normal LV function 3. Normal LVEDP  CTA head and neck 11/22/2015 No evidence of stenosis of either carotid artery. Slight irregularity and possible mild narrowing vertebral arteries at the level of the foramen magnum however, streak artifact from dental work may contribute to this appearance.  TTE 05/11/16 - Left ventricle: The cavity size was normal. Wall thickness was   normal. Systolic function was normal. The estimated ejection   fraction was in the range of 60% to 65%. Wall motion was normal;   there were no regional wall motion abnormalities. The study is   not technically sufficient to allow  evaluation of LV diastolic   function. - Right ventricle: Pacer wire or catheter noted in right ventricle. Impressions: - No cardiac source of emboli was indentified.  MRI 05/10/16 No acute intracranial abnormality and largely unremarkable for age noncontrast MRI appearance of the brain. Mild for age nonspecific white matter changes.  CTA head and neck 05/10/16 1. Stable since January and largely normal for age CTA Head and Neck. There is stable bilateral distal vertebral artery atherosclerosis and mild stenosis. Little to no carotid plaque and No carotid stenosis. Normal ophthalmic artery origins. 2. Continued stable CT appearance of the brain.   Lab Review  Component     Latest Ref Rng 08/10/2014 03/14/2015  Cholesterol, Total     100 - 199 mg/dL 158   Triglycerides     <150 mg/dL 135 153 (H)  HDL Cholesterol     >=46 mg/dL 57 54  VLDL Cholesterol Cal     5 - 40 mg/dL 27   LDL (calc)     0 - 99 mg/dL 74 113 (H)  Total CHOL/HDL Ratio      2.8 3.7  Cholesterol     0 - 200 mg/dL  198  VLDL     0 - 40 mg/dL  31  TSH     0.450 - 4.500 uIU/mL 2.080   Free T4     0.82 - 1.77 ng/dL 1.39   Hemoglobin A1C     4.8 - 5.6 % 6.0 (H)   Est. average glucose Bld gHb Est-mCnc      126   Brain Natriuretic Peptide     0.0 - 100.0 pg/mL  84.7   Component     Latest Ref Rng 11/15/2015  Cholesterol, Total     100 - 199 mg/dL 158  Triglycerides     0 - 149 mg/dL 130  HDL Cholesterol     >39 mg/dL 50  VLDL Cholesterol Cal     5 - 40 mg/dL 26  LDL (calc)     0 - 99 mg/dL 82  Total CHOL/HDL Ratio     0.0 - 4.4 ratio units 3.2  Hemoglobin A1C     4.8 - 5.6 % 6.4 (H)  Est. average glucose Bld gHb Est-mCnc      137   Component     Latest Ref Rng & Units 05/10/2016 05/11/2016  Cholesterol  0 - 200 mg/dL  161  Triglycerides     <150 mg/dL  134  HDL Cholesterol     >40 mg/dL  50  Total CHOL/HDL Ratio     RATIO  3.2  VLDL     0 - 40 mg/dL  27  LDL (calc)     0 - 99 mg/dL   84  Hemoglobin A1C     4.8 - 5.6 % 5.8 (H)   Mean Plasma Glucose     mg/dL 120   Sed Rate     0 - 22 mm/hr 11   CRP     <1.0 mg/dL <0.5     Assessment    In summary, [name] is a 77 y.o. female with PMH of HTN, DM, sinus node dysfunction s/p pacemaker who is followed up in clinic for whole body shakiness. 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 After stopping her paxil and cymbalta as well as Xanax and put on clonazepam, her symptoms resolved. She stated that clonazepam also helped her anxiety. 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   During the interval time, she also had dyspnea, chest tightness and orthopnea. Cardiac workup negative. She admitted to have severe depression, anxiety over the death of her daughter and the relationship with her son and husband. She was referred to psychiatrist and put on wellbutrin and Klonopin.  However pt had transient right UE and LE numbness and weakness, lasted about 30-14mn and resolved. Her 05/2014 head CT concerning for right frontal embolic stroke. CTA head and neck unremarkable, LDL 82 and A1C 6.4. Pacemaker interrogation by Dr. TLovena Leshowed no afib as per patient. Switched from ASA 81 to plavix.   Admitted in 73/6144for complicated migraine episode. MRI, CTA h/n, TTE also negative. Continued plavix and lipitor. Also put on nortriptyline for HA prevention.  However, notriptyline was not on her medication list last visit.  She complained of numerous symptoms last visit in clinic, all consistent with anxiety and stress with mild depression.  She followed with her psychologist and psychiatrist. Over the time, her symptoms all resolved.   Plan: - continue plavix and lipitor for stroke prevention - continue follow up with psychology and psychiatry  - follow up with cardiology for pacemaker, if afib found,  please let u[place]ore[place]now and we may need to change your medication - continue to relax and de-stress, which is important for daily life  - check BP at home.  - Follow up with your primary care physician for stroke risk factor modification. Recommend maintain blood pressure goal around130/80, diabetes with hemoglobin A1c goal below 6.5% and lipids with LDL cholesterol goal below 70 mg/dL.   - healthy diet and regular exercise.  - follow up as needed.    I spent more than 25 minutes of face to face time with the patient. Greater than 50% of time was spent in counseling and coordination of care. We discussed about relaxation and de-stress skills, and the follow-up with psychiatry and psychology.   No orders of the defined types were placed in this encounter.   No orders of the defined types were placed in this encounter.   Patient Instructions  - continue plavix and lipitor for stroke prevention - continue follow up with psychology and psychiatry  - follow up with cardiology for pacemaker, if afib found, please let uKoreaknow and we  may need to change your medication - continue to relax and de-stress, which is important for daily life  - check BP at home.  - Follow up with your primary care physician for stroke risk factor modification. Recommend maintain blood pressure goal around130/80, diabetes with hemoglobin A1c goal below 6.5% and lipids with LDL cholesterol goal below 70 mg/dL.   - healthy diet and regular exercise.  - follow up as needed.     Rosalin Hawking, MD PhD [facility] [address], [place], [place] [address] [phone]

## 2018-01-11 IMAGING — MR MR HEAD W/O CM
9 of 10 series · 35 of 48 positions shown · non-contrast
Comparison: CTA head and neck today reported separately. Cervical
spine MRI 06/05/2005.

CLINICAL DATA: 75-year-old female with episodes of visual
disturbances including amaurosis and flashes of light. Left side
headache. Patient reports prior "Mini-stroke" .

EXAM:
MRI HEAD WITHOUT CONTRAST
TECHNIQUE: Multiplanar, multiecho pulse sequences of the brain and surrounding
structures were obtained without intravenous contrast.

[Series 3: T1 · sagittal · 5.0mm · 0.47mm/px · 1 of 23 slices shown]
[im 1/23]
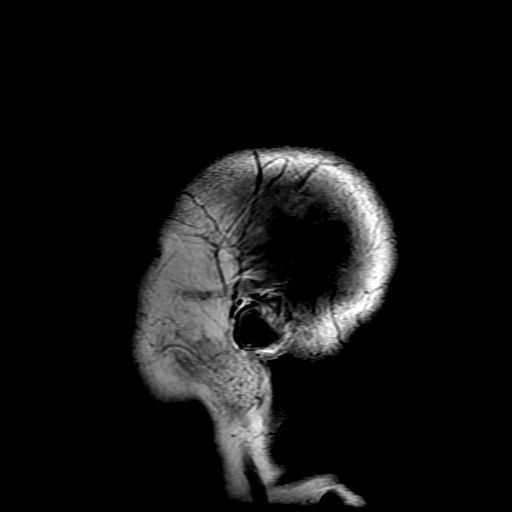

[Series 4: T2 · coronal · 5.0mm · 0.43mm/px · 2 of 28 slices shown (1 of 2)]
[im 1/28]
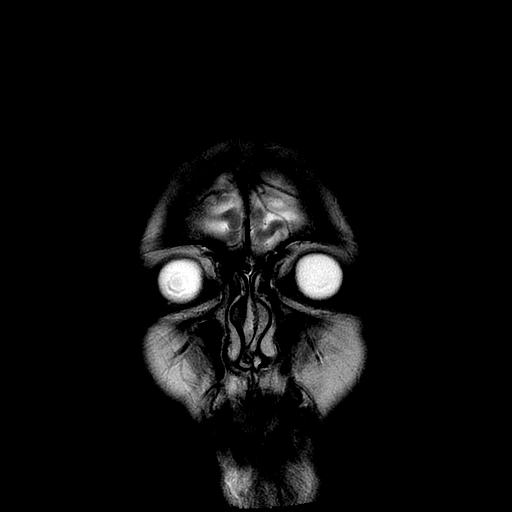
[im 28/28]
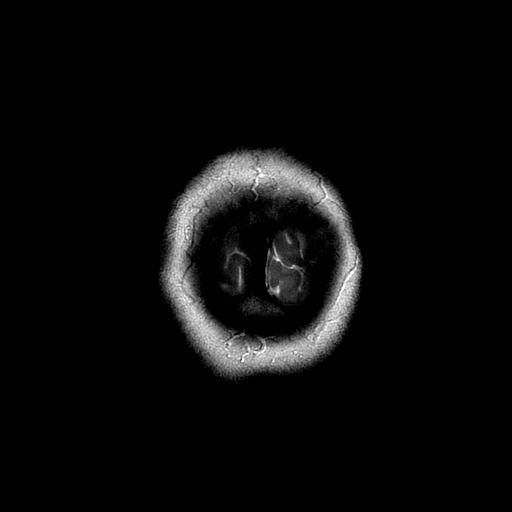

[Series 5: DWI · axial · 3.0mm · 1.09mm/px · z∈[-88,+44]mm · 8 of 92 slices shown (1 of 4)]
[im 1/92]
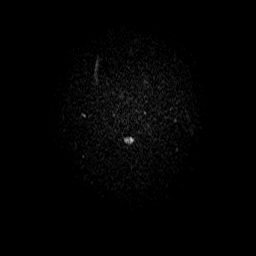
[im 11/92]
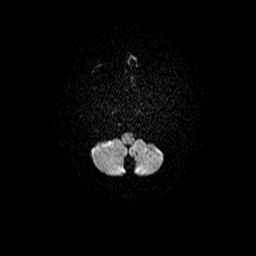
[im 31/92]
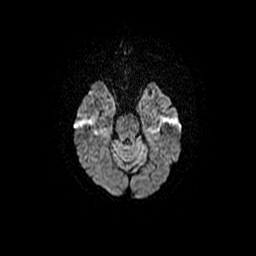
[im 41/92]
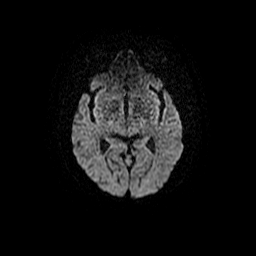
[im 51/92]
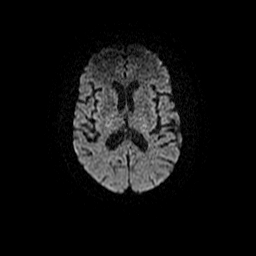
[im 61/92]
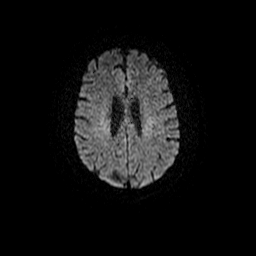
[im 81/92]
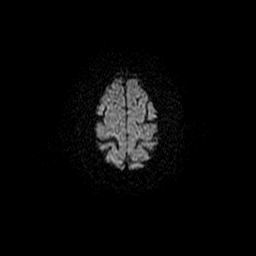
[im 92/92]
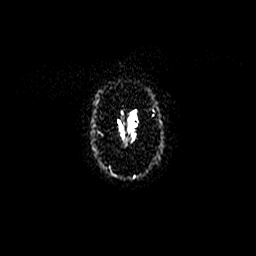

[Series 6: T2 · axial · 5.0mm · 0.43mm/px · z∈[-90,+44]mm · 3 of 24 slices shown (2 of 2)]
[im 1/24]
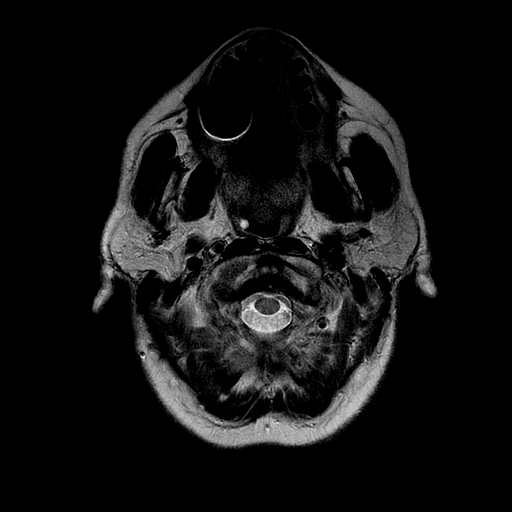
[im 12/24]
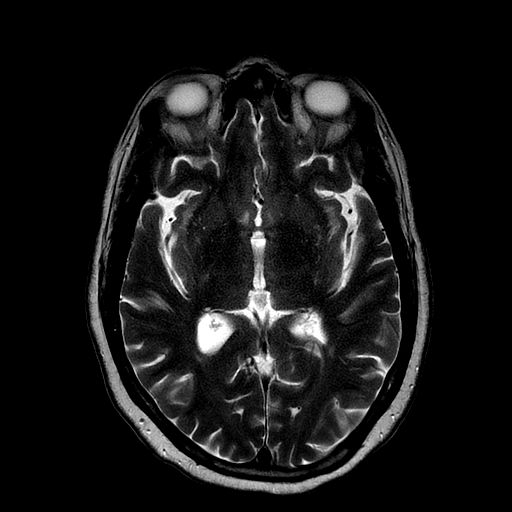
[im 24/24]
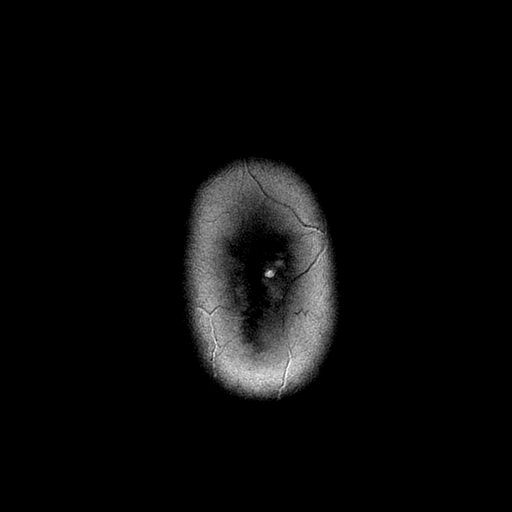

[Series 7: FLAIR · axial · 5.0mm · 0.43mm/px · z∈[-90,+44]mm · 3 of 24 slices shown]
[im 1/24]
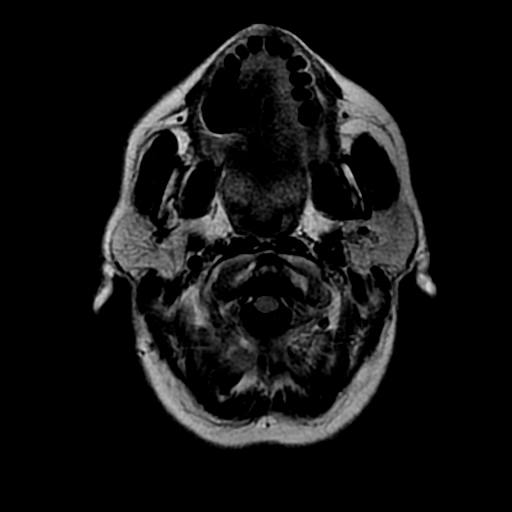
[im 12/24]
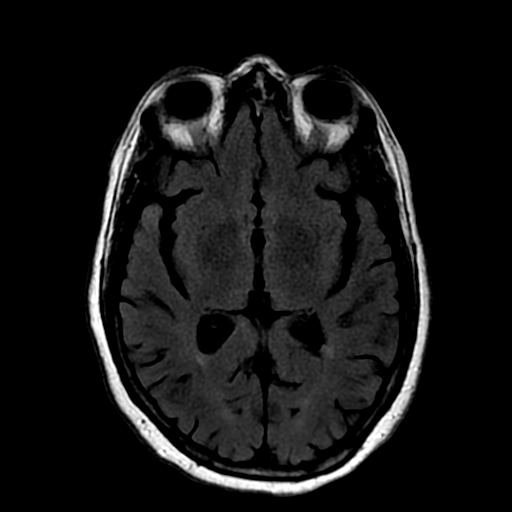
[im 24/24]
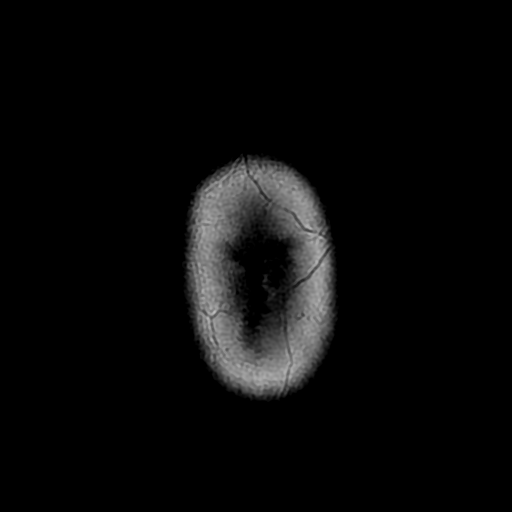

[Series 8: ax mpgr · axial · 5.0mm · 0.43mm/px · z∈[-90,-26]mm · 2 of 24 slices shown]
[im 1/24]
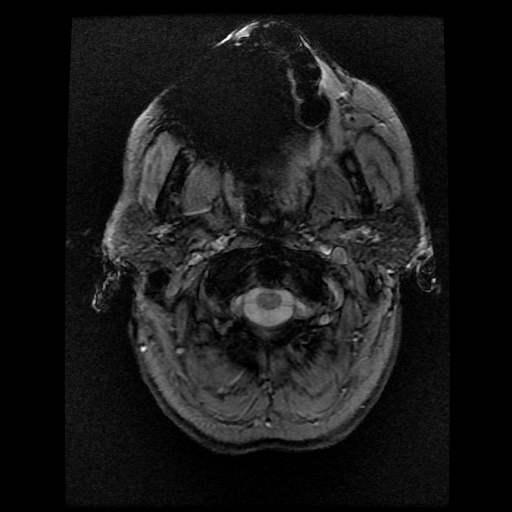
[im 12/24]
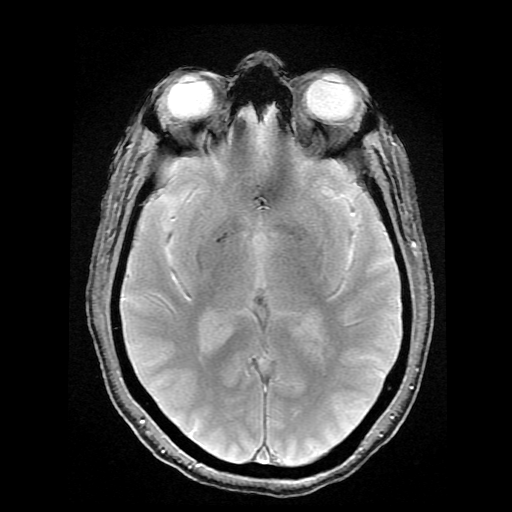

[Series 10: DWI · coronal · 5.0mm · 1.09mm/px · 7 of 68 slices shown (2 of 4)]
[im 1/68]
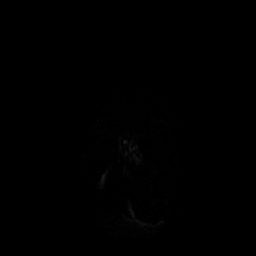
[im 12/68]
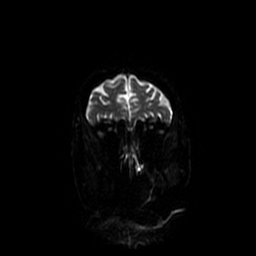
[im 23/68]
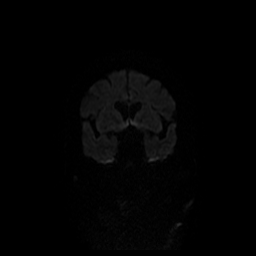
[im 34/68]
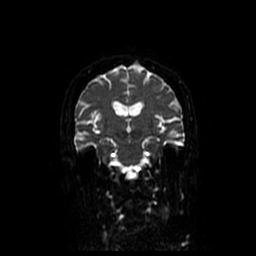
[im 45/68]
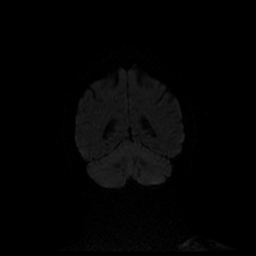
[im 56/68]
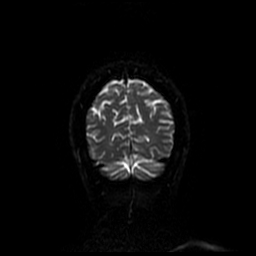
[im 68/68]
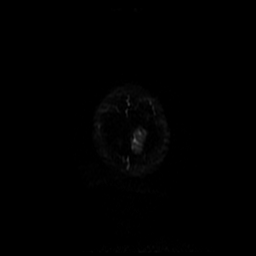

[Series 500: DWI · axial · 3.0mm · 1.09mm/px · z∈[-88,+44]mm · 5 of 46 slices shown (3 of 4)]
[im 1/46]
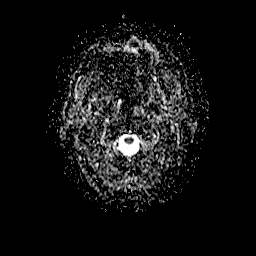
[im 12/46]
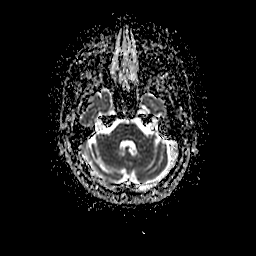
[im 23/46]
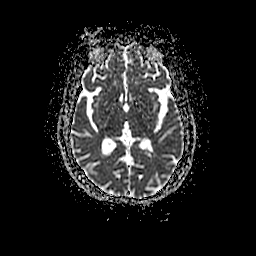
[im 34/46]
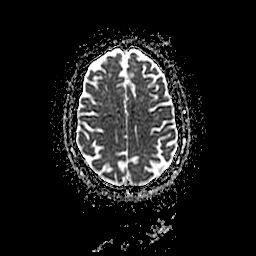
[im 46/46]
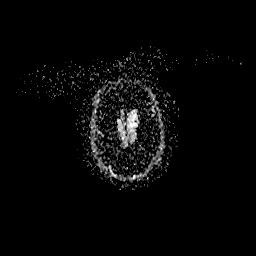

[Series 1000: DWI · coronal · 5.0mm · 1.09mm/px · 4 of 34 slices shown (4 of 4)]
[im 1/34]
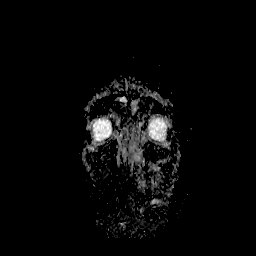
[im 12/34]
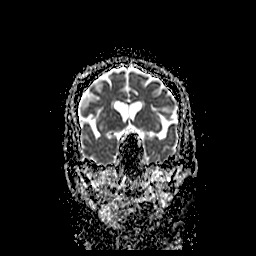
[im 23/34]
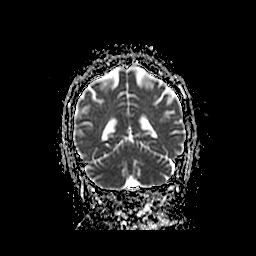
[im 34/34]
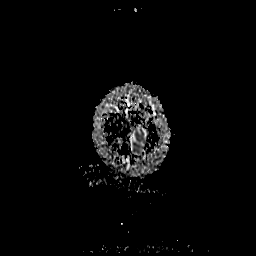

[35 of 48 positions shown; findings below may reference images not displayed]

FINDINGS: No restricted diffusion or evidence of acute infarction. Major
intracranial vascular flow voids are preserved.

No midline shift, mass effect, evidence of mass lesion,
ventriculomegaly, extra-axial collection or acute intracranial
hemorrhage. Cervicomedullary junction and pituitary are within
normal limits. Mild for age scattered small nonspecific cerebral
white matter T2 and FLAIR hyperintense foci. No cortical
encephalomalacia or chronic cerebral blood products. Deep gray
matter nuclei, brainstem, and cerebellum are normal for age. Normal
optic chiasm. No optic nerve abnormality. Postoperative changes to
both globes with otherwise negative orbits soft tissues.

Visible internal auditory structures appear normal. Visualized
paranasal sinuses and mastoids are stable and well pneumatized.
Negative scalp soft tissues. Stable visualized cervical spine.
Normal bone marrow signal.
IMPRESSION: No acute intracranial abnormality and largely unremarkable for age
noncontrast MRI appearance of the brain.

Mild for age nonspecific white matter changes.

## 2018-01-11 IMAGING — CT CT HEAD W/O CM
4 series · 17 of 47 positions shown, 19 images · non-contrast
Comparison: CT HEAD November 22, 2015

CLINICAL DATA: Five episode of visual disturbance this week,
LEFT-sided headache. History of stroke, hypertension slight diabetes

EXAM:
CT HEAD WITHOUT CONTRAST
TECHNIQUE: Contiguous axial images were obtained from the base of the skull
through the vertex without intravenous contrast.

[Series 2: head without · axial · non-contrast · 0.43mm/px · z∈[+1245,+1365]mm · 7 of 33 slices shown, 9 images]
[im 5/33  brain]
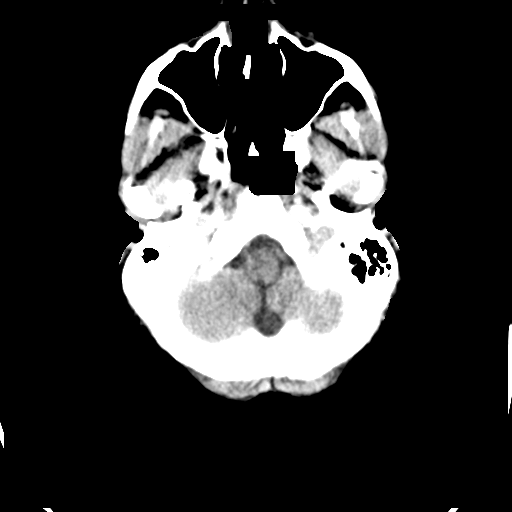
[im 5/33  bone]
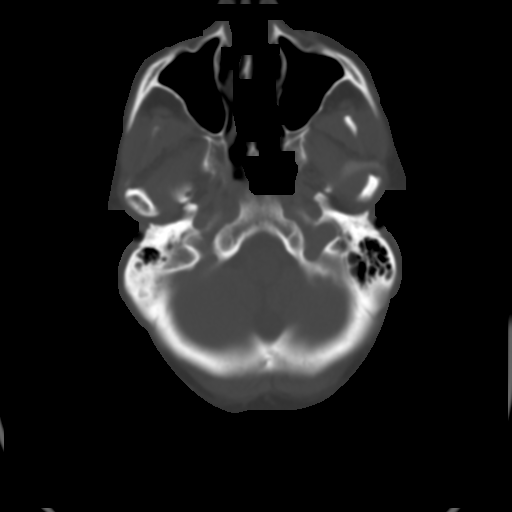
[im 9/33  brain]
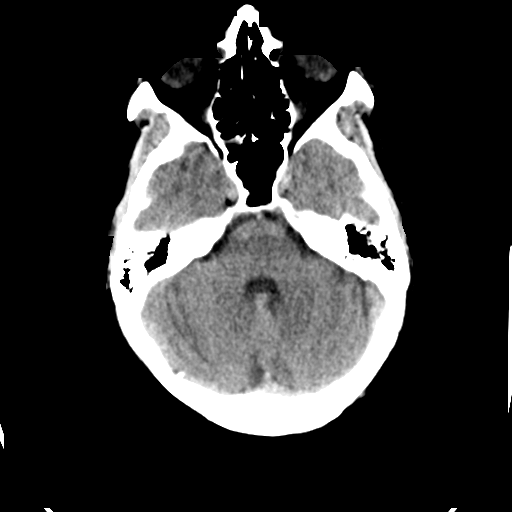
[im 13/33  brain]
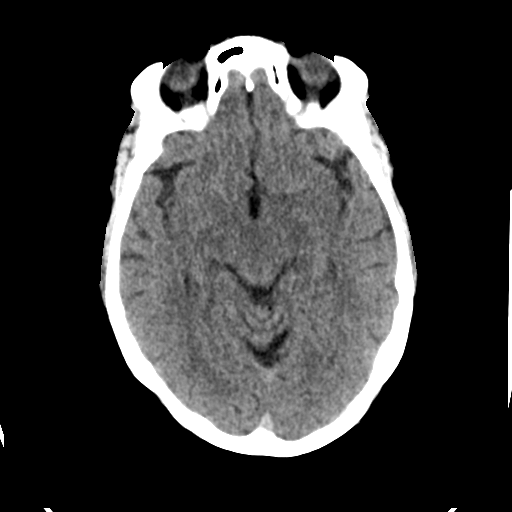
[im 17/33  brain]
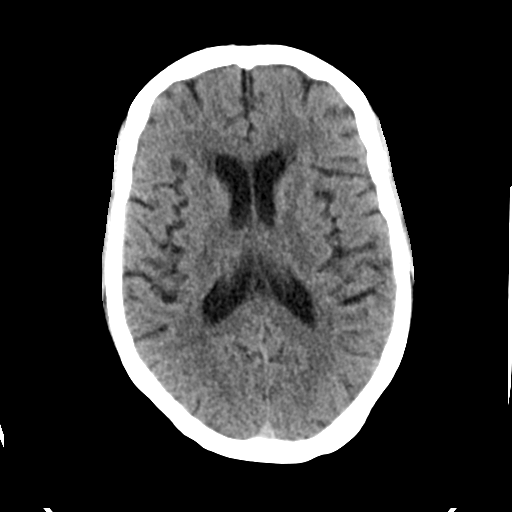
[im 21/33  brain]
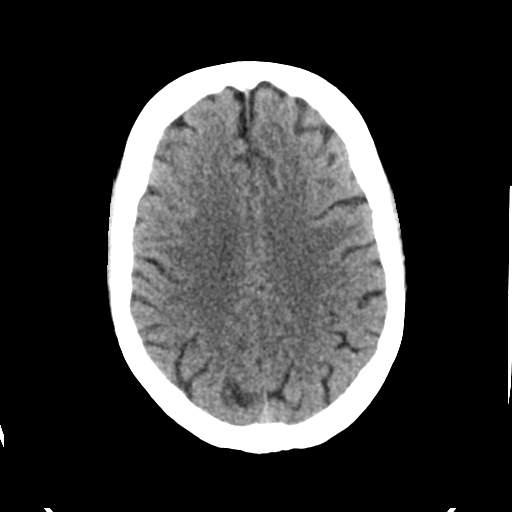
[im 21/33  bone]
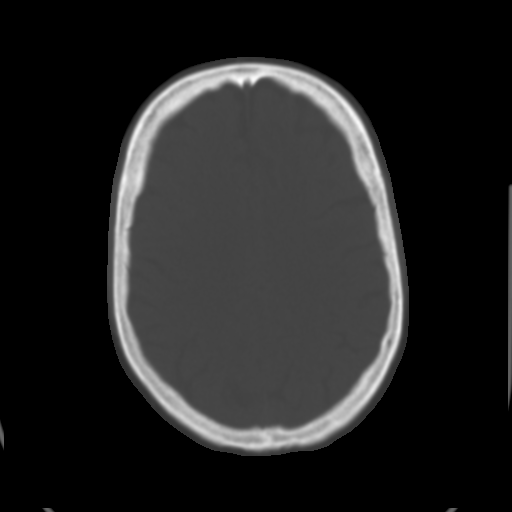
[im 25/33  brain]
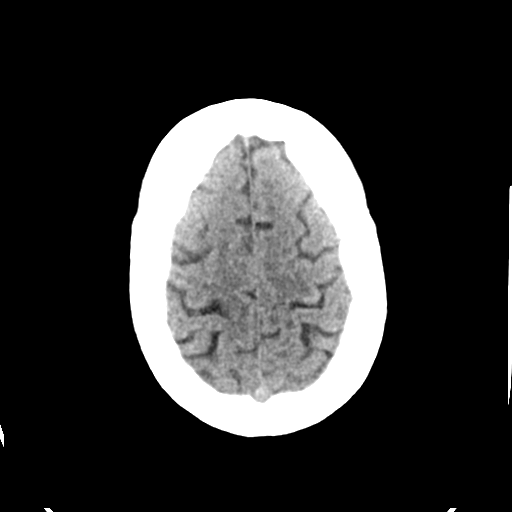
[im 29/33  brain]
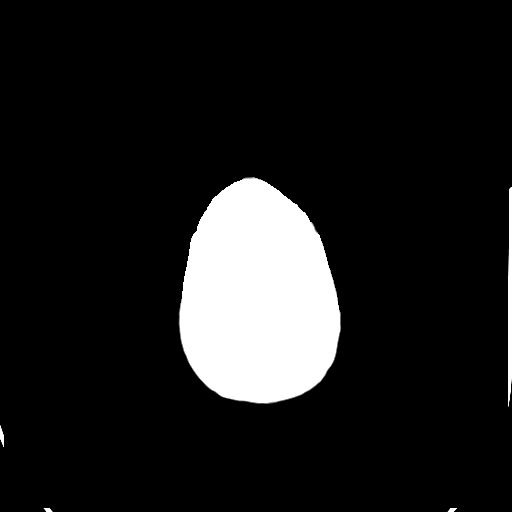

[Series 3: head bone · axial · 0.43mm/px · z∈[+1241,+1297]mm · 4 of 83 slices shown]
[im 9/83  bone]
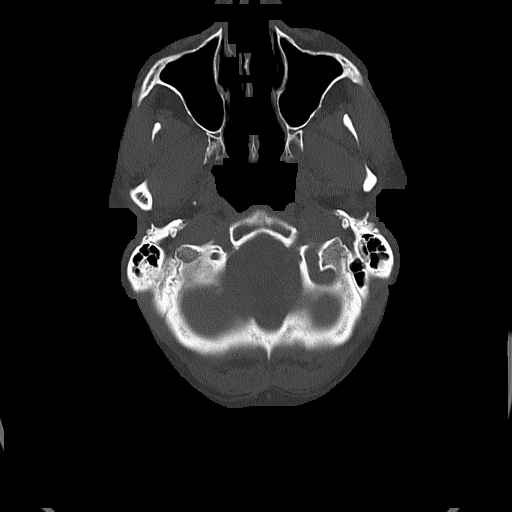
[im 17/83  bone]
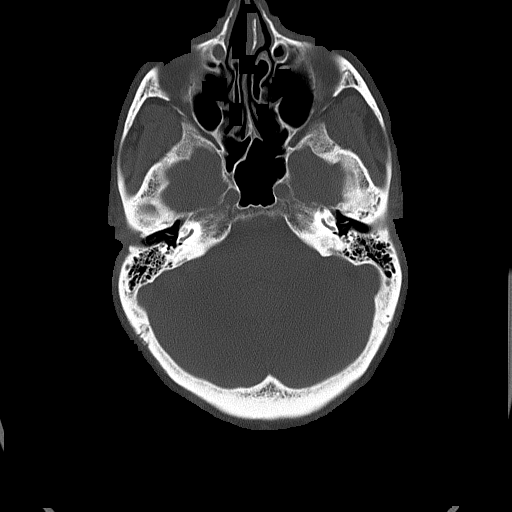
[im 25/83  bone]
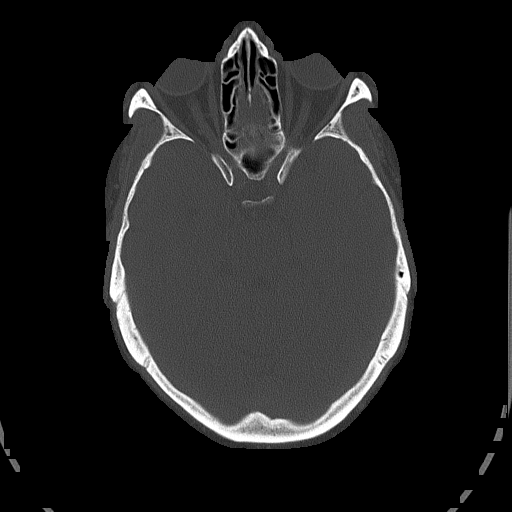
[im 37/83  bone]
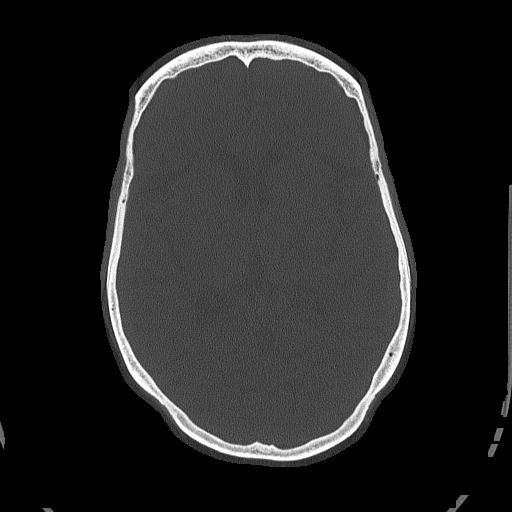

[Series 4: head without cor · coronal · non-contrast · 0.33mm/px · 3 of 67 slices shown]
[im 23/67  brain]
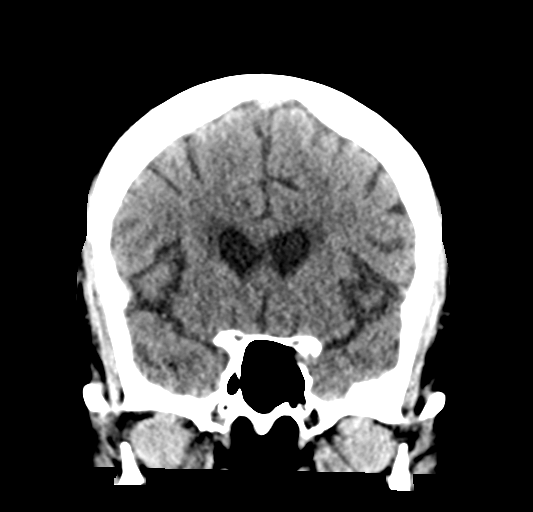
[im 30/67  brain]
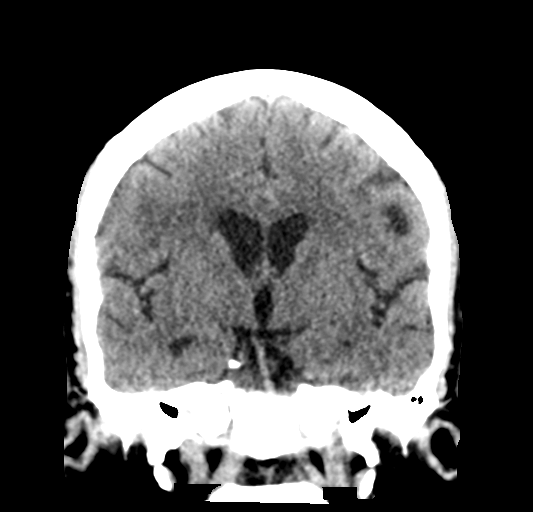
[im 37/67  brain]
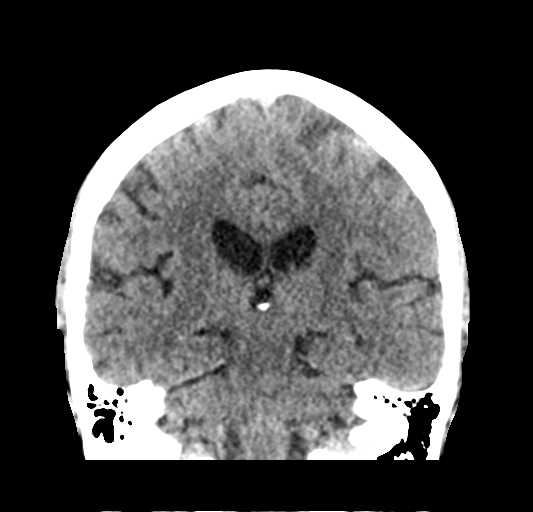

[Series 5: head without sag · sagittal · non-contrast · 0.35mm/px · 3 of 67 slices shown]
[im 23/67  brain]
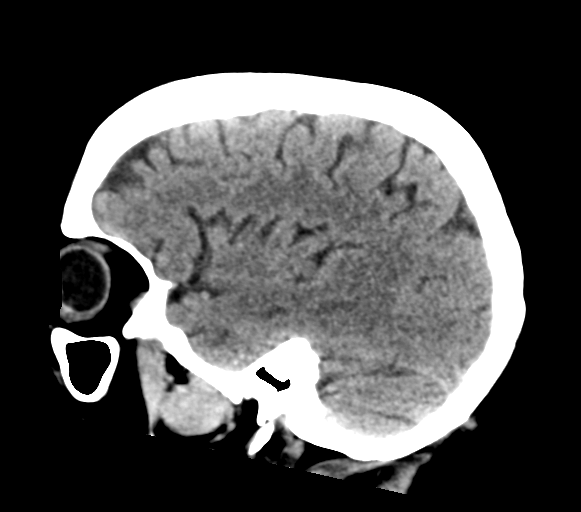
[im 34/67  brain]
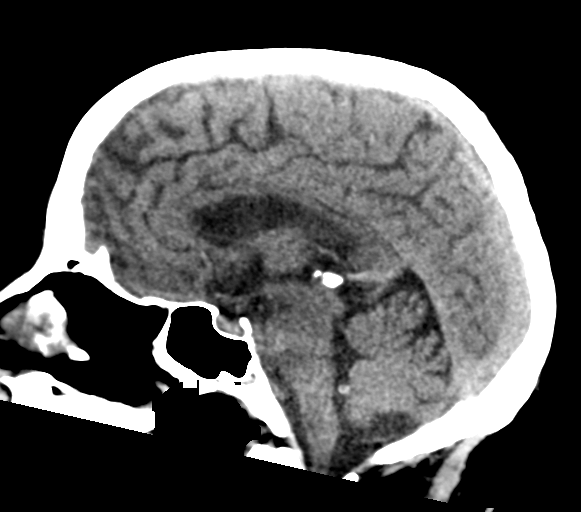
[im 45/67  brain]
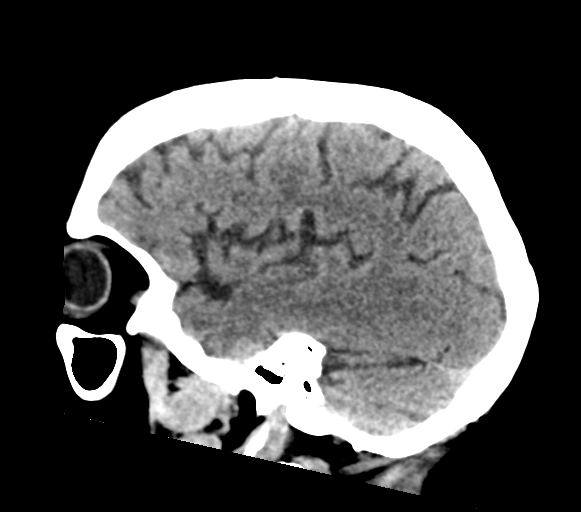

[17 of 47 positions shown; findings below may reference images not displayed]

FINDINGS: INTRACRANIAL CONTENTS: The ventricles and sulci are normal for age.
No intraparenchymal hemorrhage, mass effect nor midline shift.
Patchy supratentorial white matter hypodensities are less than
expected for patient's age and though non-specific likely represent
chronic small vessel ischemic disease. No acute large vascular
territory infarcts. No abnormal extra-axial fluid collections. Basal
cisterns are patent. Minimal calcific atherosclerosis of the carotid
siphons.

ORBITS: The included ocular globes and orbital contents are
non-suspicious. Status post bilateral ocular lens implants.

SINUSES: Mild paranasal sinusitis. Mastoid air cells are well
aerated.

SKULL/SOFT TISSUES: No skull fracture. No significant soft tissue
swelling. Moderate bilateral temporomandibular osteoarthrosis.
IMPRESSION: Negative CT HEAD for age.

## 2018-01-13 ENCOUNTER — Encounter: Payer: Self-pay | Admitting: Internal Medicine

## 2018-01-13 ENCOUNTER — Telehealth: Payer: Self-pay | Admitting: Cardiology

## 2018-01-13 ENCOUNTER — Ambulatory Visit (INDEPENDENT_AMBULATORY_CARE_PROVIDER_SITE_OTHER): Payer: Medicare Other | Admitting: *Deleted

## 2018-01-13 DIAGNOSIS — I495 Sick sinus syndrome: Secondary | ICD-10-CM | POA: Diagnosis not present

## 2018-01-13 NOTE — Telephone Encounter (Signed)
LMOVM reminding pt to send remote transmission.   

## 2018-01-14 ENCOUNTER — Encounter: Payer: Self-pay | Admitting: Cardiology

## 2018-01-14 NOTE — Progress Notes (Signed)
Remote pacemaker transmission.   

## 2018-01-17 ENCOUNTER — Encounter: Payer: Self-pay | Admitting: Internal Medicine

## 2018-01-26 LAB — CUP PACEART REMOTE DEVICE CHECK
Battery Remaining Longevity: 102 mo
Battery Voltage: 3.02 V
Brady Statistic AP VP Percent: 0.11 %
Brady Statistic AP VS Percent: 99.12 %
Brady Statistic AS VP Percent: 0 %
Brady Statistic AS VS Percent: 0.77 %
Brady Statistic RA Percent Paced: 99.21 %
Brady Statistic RV Percent Paced: 0.11 %
Date Time Interrogation Session: 20190314164855
Implantable Lead Implant Date: 20070910
Implantable Lead Implant Date: 20070910
Implantable Lead Location: 753859
Implantable Lead Location: 753860
Implantable Lead Model: 5076
Implantable Lead Model: 5076
Implantable Pulse Generator Implant Date: 20170608
Lead Channel Impedance Value: 304 Ohm
Lead Channel Impedance Value: 399 Ohm
Lead Channel Impedance Value: 418 Ohm
Lead Channel Impedance Value: 437 Ohm
Lead Channel Pacing Threshold Amplitude: 0.875 V
Lead Channel Pacing Threshold Amplitude: 0.875 V
Lead Channel Pacing Threshold Pulse Width: 0.4 ms
Lead Channel Pacing Threshold Pulse Width: 0.4 ms
Lead Channel Sensing Intrinsic Amplitude: 1.25 mV
Lead Channel Sensing Intrinsic Amplitude: 1.25 mV
Lead Channel Sensing Intrinsic Amplitude: 13.75 mV
Lead Channel Sensing Intrinsic Amplitude: 13.75 mV
Lead Channel Setting Pacing Amplitude: 2 V
Lead Channel Setting Pacing Amplitude: 2.5 V
Lead Channel Setting Pacing Pulse Width: 0.4 ms
Lead Channel Setting Sensing Sensitivity: 2.8 mV

## 2018-02-10 ENCOUNTER — Other Ambulatory Visit: Payer: Self-pay | Admitting: Physician Assistant

## 2018-02-10 DIAGNOSIS — Z139 Encounter for screening, unspecified: Secondary | ICD-10-CM

## 2018-03-21 ENCOUNTER — Ambulatory Visit
Admission: RE | Admit: 2018-03-21 | Discharge: 2018-03-21 | Disposition: A | Discharge status: Home / Self Care | Payer: Medicare Other | Source: Ambulatory Visit | Attending: Physician Assistant | Admitting: Physician Assistant

## 2018-03-21 DIAGNOSIS — Z139 Encounter for screening, unspecified: Secondary | ICD-10-CM

## 2018-03-30 ENCOUNTER — Ambulatory Visit (INDEPENDENT_AMBULATORY_CARE_PROVIDER_SITE_OTHER): Payer: Medicare Other | Admitting: Internal Medicine

## 2018-03-30 ENCOUNTER — Encounter: Payer: Self-pay | Admitting: Internal Medicine

## 2018-03-30 ENCOUNTER — Telehealth: Payer: Self-pay

## 2018-03-30 VITALS — BP 122/74 | HR 68 | Ht 66.0 in | Wt 172.4 lb

## 2018-03-30 DIAGNOSIS — Z7902 Long term (current) use of antithrombotics/antiplatelets: Secondary | ICD-10-CM | POA: Diagnosis not present

## 2018-03-30 DIAGNOSIS — Z800 Family history of malignant neoplasm of digestive organs: Secondary | ICD-10-CM | POA: Diagnosis not present

## 2018-03-30 DIAGNOSIS — K219 Gastro-esophageal reflux disease without esophagitis: Secondary | ICD-10-CM

## 2018-03-30 NOTE — Progress Notes (Signed)
[name] 77 y.o. 1941/06/23 [id number]  Assessment & Plan:   Encounter Diagnoses  Name Primary?  . Family history of colon cancer Yes  . Encounter for long-term use of antiplatelets/antithrombotics   . Gastroesophageal reflux disease without esophagitis    The patient is interested in pursuing at least one more colonoscopy, given family history of colon cancer in multiple relatives.  She is satisfied with her reflux situation at this time and will continue with current regimen.  Plan for colonoscopy, she will need to hold her clopidogrel, we will clarify with her prescriber she understands the real but rare risk of stroke off clopidogrel and we reviewed the typical other risks of endoscopy procedures.The risks and benefits as well as alternatives of endoscopic procedure(s) have been discussed and reviewed. All questions answered. The patient agrees to proceed.  I appreciate the opportunity to care for this patient. CC: [place], [facility]  Subjective:   Chief Complaint: Reflux, colon cancer screening with family history  HPI The patient is here for follow-up, she has reflux issues and gets an every 5-year colonoscopy due to a family history of colon cancer in father 2 sisters and a brother.  She is interested in repeating a screening.  She has not had colon polyps to my recollection or chart review.  When last seen in late 2018 she was still having some reflux type symptoms despite aggressive medication with PPI H2 blocker and Carafate.  We discussed evaluation to consider a fundoplication but her neurologist told her he thought she was not really ready to have that and go through that type of procedure due to her underlying serotonin syndrome and residual problems.  She has modified her diet and with the medications in general her quality of life is good enough she thinks with respect to her reflux.  She does not have any lower GI symptoms at  this time. Allergies  Allergen Reactions  . Citalopram     Other reaction(s): Serum Sickness (ALLERGY) Serotonin Syndrome  . Codeine Other (See Comments)    hallucinations  . Cymbalta [Duloxetine Hcl] Other (See Comments)    Serotonin Syndrome  . Metformin And Related     Causes dehydration and causes me to go to the hospital  . Paroxetine Hcl     Serotonin Syndrome  . Trazodone And Nefazodone Other (See Comments)    Serotonin Syndrome  . Caffeine Anxiety    Nervousness    Current Meds  Medication Sig  . amLODipine (NORVASC) 2.5 MG tablet Take 1 tablet (2.5 mg total) by mouth daily.  Marland Kitchen atorvastatin (LIPITOR) 80 MG tablet Take 1 tablet (80 mg total) by mouth every evening.  Marland Kitchen buPROPion (WELLBUTRIN XL) 300 MG 24 hr tablet Take 300 mg by mouth daily before breakfast.  . clonazePAM (KLONOPIN) 0.5 MG tablet Take 0.5-1 mg by mouth 2 (two) times daily. 0.5 mg at lunchtime daily and 1 mg at night  . clopidogrel (PLAVIX) 75 MG tablet Take 1 tablet (75 mg total) by mouth daily.  . Coenzyme Q10 200 MG capsule Take 200 mg by mouth daily with lunch.  . cyanocobalamin 500 MCG tablet Take 1,000 mcg by mouth daily with lunch. Vitamin B12  . doxepin (SINEQUAN) 10 MG capsule Take 1 capsule by mouth at bedtime.  . gabapentin (NEURONTIN) 300 MG capsule Take 600 mg by mouth 3 (three) times daily.  Marland Kitchen glipiZIDE (GLUCOTROL XL) 5 MG 24 hr tablet daily with breakfast.   . glucose blood (  ONE TOUCH ULTRA TEST) test strip AS DIRECTED TWICE DAILY  . hydrochlorothiazide (HYDRODIURIL) 25 MG tablet Take 25 mg by mouth daily.  Marland Kitchen losartan (COZAAR) 100 MG tablet Take 1 tablet (100 mg total) by mouth daily. Patient needs to call our office to schedule an appointment with Dr Swaziland for future refills.  . Multiple Vitamins-Minerals (ANTIOXIDANT PO) Take 1 tablet by mouth daily with lunch.  . Omega-3 Fatty Acids (FISH OIL PO) Take 2 capsules by mouth daily with lunch.  Marland Kitchen OVER THE COUNTER MEDICATION Take 2 tablets by  mouth daily with lunch. Circulation and Vein Support by Feliberto Harts  . OVER THE COUNTER MEDICATION Take 2 capsules by mouth daily. Vitamins for eyes  . OVER THE COUNTER MEDICATION Take 2 capsules by mouth daily. Friendly Charlie Pitter  . pantoprazole (PROTONIX) 40 MG tablet Take 1 tablet (40 mg total) by mouth 2 (two) times daily before a meal.  . ranitidine (ZANTAC) 150 MG tablet TAKE 1 TABLET BY MOUTH DAILY  . sucralfate (CARAFATE) 1 g tablet TAKE 1 TABLET(1 GRAM) BY MOUTH FOUR TIMES DAILY AT BEDTIME WITH MEALS  . terbinafine (LAMISIL) 250 MG tablet Take 250 mg by mouth daily.  Marland Kitchen tiZANidine (ZANAFLEX) 2 MG tablet Take 2 mg by mouth as needed.    Past Medical History:  Diagnosis Date  . Allergy   . Arthritis   . Cardiac arrhythmia    now has pacemaker  . Cataract   . Diverticulosis   . DM (diabetes mellitus) (HCC)   . Fibromyalgia   . GERD (gastroesophageal reflux disease)   . Hyperlipidemia   . Hypertension   . Internal and external hemorrhoids without complication   . Myocardial infarction (HCC)   . Potassium deficiency    Past Surgical History:  Procedure Laterality Date  . ABDOMINAL HYSTERECTOMY    . APPENDECTOMY    . bil carple tunnel surgery    . CARDIAC CATHETERIZATION N/A 04/09/2015   Procedure: Left Heart Cath and Coronary Angiography;  Surgeon: Peter M Swaziland, MD;  Location: [facility] INVASIVE CV LAB;  Service: Cardiovascular;  Laterality: N/A;  . COLONOSCOPY  multiple  . EP IMPLANTABLE DEVICE N/A 04/09/2016   Procedure: PPM Generator Changeout;  Surgeon: Marinus Maw, MD;  Location: [facility] INVASIVE CV LAB;  Service: Cardiovascular;  Laterality: N/A;  . ESOPHAGEAL MANOMETRY N/A 08/02/2017   Procedure: ESOPHAGEAL MANOMETRY (EM);  Surgeon: Napoleon Form, MD;  Location: [facility] ENDOSCOPY;  Service: Endoscopy;  Laterality: N/A;  . KNEE SURGERY     right x 2  . NECK SURGERY     x2  . PACEMAKER INSERTION    . PH IMPEDANCE STUDY N/A 08/02/2017   Procedure: PH IMPEDANCE STUDY;  Surgeon:  Napoleon Form, MD;  Location: [facility] ENDOSCOPY;  Service: Endoscopy;  Laterality: N/A;  . UPPER GASTROINTESTINAL ENDOSCOPY     Social History   Social History Narrative   Patient is married with 2 children. She lives at home with her husband.    Patient is right handed.   Patient has 12 th grade education.   Patient drinks occasional caffeine.   family history includes Breast cancer in her maternal aunt; Colon cancer in her brother, father, and sister; Colon polyps in her sister; Coronary artery disease in her father; Crohn's disease in her sister; Dementia in her daughter and mother; Diabetes in her cousin, daughter, father, maternal aunt, and maternal uncle; Heart attack in her brother, daughter, and father; Irritable bowel syndrome in her sister; Kidney disease  in her daughter; Lung cancer in her father and mother; Prostate cancer in her father.   Review of Systems As per HPI  Objective:   Physical Exam BP 122/74   Pulse 68   Ht  (1.676 m)   Wt 172 lb 6 oz (78.2 kg)   BMI 27.82 kg/m  No acute distress looking younger than stated age Eyes anicteric Lungs clear Heart sounds normal there is a pacemaker in the left subclavicular area The abdomen is obese She is alert and oriented x3 appropriate mood and affect

## 2018-03-30 NOTE — Patient Instructions (Signed)
You have been scheduled for a colonoscopy. Please follow written instructions given to you at your visit today.  °Please pick up your prep supplies at the pharmacy within the next 1-3 days. °If you use inhalers (even only as needed), please bring them with you on the day of your procedure. ° °You will be contacted by our office prior to your procedure for directions on holding your Plavix.  If you do not hear from our office 1 week prior to your scheduled procedure, please call [phone] to discuss.  ° °I appreciate the opportunity to care for you. °Carl Gessner, MD, FACG °

## 2018-03-30 NOTE — Telephone Encounter (Signed)
Anti-Coag letter faxed to [facility] in [place] , fax # [phone]. Will await response.

## 2018-03-31 NOTE — Telephone Encounter (Signed)
Left her a message to call me back. We received a letter from [facility] at [place] stating that Kaimana may hold her plavix 7 days prior to her colonoscopy. Letter to be scanned into epic once I have spoken to her.

## 2018-04-04 NOTE — Telephone Encounter (Signed)
The hand written response will be sent down to be scanned into epic.

## 2018-04-04 NOTE — Telephone Encounter (Signed)
Patient informed and verbalized understanding to hold her plavix 7 days.

## 2018-04-14 ENCOUNTER — Encounter: Payer: Self-pay | Admitting: Cardiology

## 2018-04-14 ENCOUNTER — Ambulatory Visit (INDEPENDENT_AMBULATORY_CARE_PROVIDER_SITE_OTHER): Payer: Medicare Other | Admitting: *Deleted

## 2018-04-14 DIAGNOSIS — I495 Sick sinus syndrome: Secondary | ICD-10-CM | POA: Diagnosis not present

## 2018-04-14 NOTE — Progress Notes (Signed)
Remote pacemaker transmission.   

## 2018-04-19 ENCOUNTER — Other Ambulatory Visit: Payer: Self-pay | Admitting: Family Medicine

## 2018-04-19 ENCOUNTER — Ambulatory Visit
Admission: RE | Admit: 2018-04-19 | Discharge: 2018-04-19 | Disposition: A | Discharge status: Home / Self Care | Payer: Medicare Other | Source: Ambulatory Visit | Attending: Family Medicine | Admitting: Family Medicine

## 2018-04-19 DIAGNOSIS — R1011 Right upper quadrant pain: Secondary | ICD-10-CM

## 2018-04-19 LAB — CUP PACEART REMOTE DEVICE CHECK
Battery Remaining Longevity: 100 mo
Battery Voltage: 3.02 V
Brady Statistic AP VP Percent: 0.08 %
Brady Statistic AP VS Percent: 99.09 %
Brady Statistic AS VP Percent: 0 %
Brady Statistic AS VS Percent: 0.82 %
Brady Statistic RA Percent Paced: 99.15 %
Brady Statistic RV Percent Paced: 0.08 %
Date Time Interrogation Session: 20190613173943
Implantable Lead Implant Date: 20070910
Implantable Lead Implant Date: 20070910
Implantable Lead Location: 753859
Implantable Lead Location: 753860
Implantable Lead Model: 5076
Implantable Lead Model: 5076
Implantable Pulse Generator Implant Date: 20170608
Lead Channel Impedance Value: 304 Ohm
Lead Channel Impedance Value: 418 Ohm
Lead Channel Impedance Value: 418 Ohm
Lead Channel Impedance Value: 475 Ohm
Lead Channel Pacing Threshold Amplitude: 0.75 V
Lead Channel Pacing Threshold Amplitude: 0.75 V
Lead Channel Pacing Threshold Pulse Width: 0.4 ms
Lead Channel Pacing Threshold Pulse Width: 0.4 ms
Lead Channel Sensing Intrinsic Amplitude: 1.875 mV
Lead Channel Sensing Intrinsic Amplitude: 1.875 mV
Lead Channel Sensing Intrinsic Amplitude: 13.75 mV
Lead Channel Sensing Intrinsic Amplitude: 13.75 mV
Lead Channel Setting Pacing Amplitude: 2 V
Lead Channel Setting Pacing Amplitude: 2.5 V
Lead Channel Setting Pacing Pulse Width: 0.4 ms
Lead Channel Setting Sensing Sensitivity: 2.8 mV

## 2018-04-22 ENCOUNTER — Ambulatory Visit (INDEPENDENT_AMBULATORY_CARE_PROVIDER_SITE_OTHER): Payer: Medicare Other | Admitting: Internal Medicine

## 2018-04-22 ENCOUNTER — Encounter: Payer: Self-pay | Admitting: Internal Medicine

## 2018-04-22 VITALS — BP 134/88 | HR 68 | Ht 66.0 in | Wt 176.0 lb

## 2018-04-22 DIAGNOSIS — I495 Sick sinus syndrome: Secondary | ICD-10-CM

## 2018-04-22 DIAGNOSIS — Z950 Presence of cardiac pacemaker: Secondary | ICD-10-CM | POA: Diagnosis not present

## 2018-04-22 DIAGNOSIS — I10 Essential (primary) hypertension: Secondary | ICD-10-CM | POA: Diagnosis not present

## 2018-04-22 LAB — CUP PACEART INCLINIC DEVICE CHECK
Battery Remaining Longevity: 99 mo
Battery Voltage: 3.02 V
Brady Statistic AP VP Percent: 0.09 %
Brady Statistic AP VS Percent: 99.29 %
Brady Statistic AS VP Percent: 0 %
Brady Statistic AS VS Percent: 0.61 %
Brady Statistic RA Percent Paced: 99.36 %
Brady Statistic RV Percent Paced: 0.09 %
Date Time Interrogation Session: 20190621101453
Implantable Lead Implant Date: 20070910
Implantable Lead Implant Date: 20070910
Implantable Lead Location: 753859
Implantable Lead Location: 753860
Implantable Lead Model: 5076
Implantable Lead Model: 5076
Implantable Pulse Generator Implant Date: 20170608
Lead Channel Impedance Value: 323 Ohm
Lead Channel Impedance Value: 418 Ohm
Lead Channel Impedance Value: 437 Ohm
Lead Channel Impedance Value: 456 Ohm
Lead Channel Pacing Threshold Amplitude: 0.75 V
Lead Channel Pacing Threshold Amplitude: 0.75 V
Lead Channel Pacing Threshold Pulse Width: 0.4 ms
Lead Channel Pacing Threshold Pulse Width: 0.4 ms
Lead Channel Sensing Intrinsic Amplitude: 1.5 mV
Lead Channel Sensing Intrinsic Amplitude: 1.5 mV
Lead Channel Sensing Intrinsic Amplitude: 14.25 mV
Lead Channel Sensing Intrinsic Amplitude: 15.125 mV
Lead Channel Setting Pacing Amplitude: 2 V
Lead Channel Setting Pacing Amplitude: 2.5 V
Lead Channel Setting Pacing Pulse Width: 0.4 ms
Lead Channel Setting Sensing Sensitivity: 2.8 mV

## 2018-04-22 NOTE — Progress Notes (Signed)
HPI [name] returns today for followup. She is a very pleasant 77 year old woman with hypertension and sinus node dysfunction, status post permanent pacemaker insertion. She underwent PPM gen change 2 years ago. In the interim, she feels better but admits to a sedentary lifestyle. She denies sob. No edema. No syncope. When she lies down she occaisionally experiences chest pain lasting minutes.   Allergies  Allergen Reactions  . Citalopram     Other reaction(s): Serum Sickness (ALLERGY) Serotonin Syndrome  . Codeine Other (See Comments)    hallucinations  . Cymbalta [Duloxetine Hcl] Other (See Comments)    Serotonin Syndrome  . Metformin And Related     Causes dehydration and causes me to go to the hospital  . Paroxetine Hcl     Serotonin Syndrome  . Trazodone And Nefazodone Other (See Comments)    Serotonin Syndrome  . Caffeine Anxiety    Nervousness      Current Outpatient Medications  Medication Sig Dispense Refill  . amLODipine (NORVASC) 2.5 MG tablet Take 1 tablet (2.5 mg total) by mouth daily. 90 tablet 2  . atorvastatin (LIPITOR) 80 MG tablet Take 1 tablet (80 mg total) by mouth every evening. 30 tablet 1  . buPROPion (WELLBUTRIN XL) 300 MG 24 hr tablet Take 300 mg by mouth daily before breakfast.    . clonazePAM (KLONOPIN) 0.5 MG tablet Take 0.5-1 mg by mouth 2 (two) times daily. 0.5 mg at lunchtime daily and 1 mg at night    . clopidogrel (PLAVIX) 75 MG tablet Take 1 tablet (75 mg total) by mouth daily. 30 tablet 11  . Coenzyme Q10 200 MG capsule Take 200 mg by mouth daily with lunch.    . cyanocobalamin 500 MCG tablet Take 1,000 mcg by mouth daily with lunch. Vitamin B12    . doxepin (SINEQUAN) 10 MG capsule Take 1 capsule by mouth at bedtime.    . gabapentin (NEURONTIN) 300 MG capsule Take 600 mg by mouth 3 (three) times daily.    Marland Kitchen glipiZIDE (GLUCOTROL XL) 5 MG 24 hr tablet daily with breakfast.     . glucose blood (ONE TOUCH ULTRA TEST) test strip AS  DIRECTED TWICE DAILY    . hydrochlorothiazide (HYDRODIURIL) 25 MG tablet Take 25 mg by mouth daily.    Marland Kitchen losartan (COZAAR) 100 MG tablet Take 1 tablet (100 mg total) by mouth daily. Patient needs to call our office to schedule an appointment with Dr Swaziland for future refills. 90 tablet 0  . Multiple Vitamins-Minerals (ANTIOXIDANT PO) Take 1 tablet by mouth daily with lunch.    . Omega-3 Fatty Acids (FISH OIL PO) Take 2 capsules by mouth daily with lunch.    Marland Kitchen OVER THE COUNTER MEDICATION Take 2 tablets by mouth daily with lunch. Circulation and Vein Support by Feliberto Harts    . OVER THE COUNTER MEDICATION Take 2 capsules by mouth daily. Vitamins for eyes    . OVER THE COUNTER MEDICATION Take 2 capsules by mouth daily. Friendly Charlie Pitter    . pantoprazole (PROTONIX) 40 MG tablet Take 1 tablet (40 mg total) by mouth 2 (two) times daily before a meal. 60 tablet 2  . ranitidine (ZANTAC) 150 MG tablet TAKE 1 TABLET BY MOUTH DAILY 90 tablet 0  . sucralfate (CARAFATE) 1 g tablet TAKE 1 TABLET(1 GRAM) BY MOUTH FOUR TIMES DAILY AT BEDTIME WITH MEALS 120 tablet 11  . terbinafine (LAMISIL) 250 MG tablet Take 250 mg by mouth daily.    Marland Kitchen  tiZANidine (ZANAFLEX) 2 MG tablet Take 2 mg by mouth as needed.      No current facility-administered medications for this visit.      Past Medical History:  Diagnosis Date  . Abnormal nuclear stress test 04/09/2015  . Allergy   . Arthritis   . Cardiac arrhythmia    now has pacemaker  . Cataract   . Chest pain 03/14/2015  . Complicated migraine   . Diverticulosis   . Dizziness 05/10/2016  . DM (diabetes mellitus) (HCC)   . Dyspnea 03/14/2015  . Edema leg 03/14/2015  . Essential hypertension 08/11/2014  . Fibromyalgia   . GERD (gastroesophageal reflux disease)   . History of Serotonin syndrome w/ recurrent residual headaches 08/10/2014  . Hyperlipidemia   . Hypertension   . Internal and external hemorrhoids without complication   . Migraine with visual aura   .  Myocardial infarction (HCC)   . Potassium deficiency   . 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/09/2016  . TIA (transient ischemic attack) 11/15/2015  . 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 Type 2 diabetes mellitus without complication, without long-term current use of insulin (HCC) 12/10/2015    ROS:   All systems reviewed and negative except as noted in the HPI.   Past Surgical History:  Procedure Laterality Date  . ABDOMINAL HYSTERECTOMY    . APPENDECTOMY    . bil carple tunnel surgery    . CARDIAC CATHETERIZATION N/A 04/09/2015   Procedure: Left Heart Cath and Coronary Angiography;  Surgeon: Peter M Swaziland, MD;  Location: [facility] INVASIVE CV LAB;  Service: Cardiovascular;  Laterality: N/A;  . COLONOSCOPY  multiple  . EP IMPLANTABLE DEVICE N/A 04/09/2016   Procedure: PPM Generator Changeout;  Surgeon: Marinus Maw, MD;  Location: [facility] INVASIVE CV LAB;  Service: Cardiovascular;  Laterality: N/A;  . ESOPHAGEAL MANOMETRY N/A 08/02/2017   Procedure: ESOPHAGEAL MANOMETRY (EM);  Surgeon: Napoleon Form, MD;  Location: [facility]OS[facility];  Service: Endoscopy;  Laterality: N/A;  . KNEE SURGERY     right x 2  . NECK SURGERY     x2  . PACEMAKER INSERTION    . PH IMPEDANCE STUDY N/A 08/02/2017   Procedure: PH IMPEDANCE STUDY;  Surgeon: Napoleon Form, MD;  Location: [facility] ENDOSCOPY;  Service: Endoscopy;  Laterality: N/A;  . UPPER GASTROINTESTINAL ENDOSCOPY       Family History  Problem Relation Age of Onset  . Coronary artery disease Father   . Prostate cancer Father   . Colon cancer Father   . Lung cancer Father   . Diabetes Father   . Heart attack Father   . Diabetes Daughter   . Dementia Daughter   . Colon cancer Sister        x 2  . Lung cancer Mother   . Dementia Mother   . Colon polyps Sister   . Crohn's disease Sister   . Diabetes Maternal Aunt        multiple  . Breast cancer Maternal Aunt   . Colon cancer Brother   . Kidney disease Daughter     . Diabetes Cousin        multiple  . Diabetes Maternal Uncle        multiple  . Irritable bowel syndrome Sister   . Heart attack Daughter   . Heart attack Brother   . Hypertension Neg Hx   . Esophageal cancer Neg Hx   . Rectal cancer Neg Hx   .  Stomach cancer Neg Hx      Social History   Socioeconomic History  . Marital status: Married    Spouse name: Fayrene Fearingjames  . Number of children: 2  . Years of education: 6312  . Highest education level: Not on file  Occupational History  . Occupation: Retired    Associate Professormployer: RETIRED  Social Needs  . Financial resource strain: Not on file  . Food insecurity:    Worry: Not on file    Inability: Not on file  . Transportation needs:    Medical: Not on file    Non-medical: Not on file  Tobacco Use  . Smoking status: Never Smoker  . Smokeless tobacco: Never Used  Substance and Sexual Activity  . Alcohol use: No    Alcohol/week: 0.0 oz  . Drug use: No  . Sexual activity: Not Currently  Lifestyle  . Physical activity:    Days per week: Not on file    Minutes per session: Not on file  . Stress: Not on file  Relationships  . Social connections:    Talks on phone: Not on file    Gets together: Not on file    Attends religious service: Not on file    Active member of club or organization: Not on file    Attends meetings of clubs or organizations: Not on file    Relationship status: Not on file  . Intimate partner violence:    Fear of current or ex partner: Not on file    Emotionally abused: Not on file    Physically abused: Not on file    Forced sexual activity: Not on file  Other Topics Concern  . Not on file  Social History Narrative   Patient is married with 2 children. She lives at home with her husband.    Patient is right handed.   Patient has 12 th grade education.   Patient drinks occasional caffeine.     BP 134/88   Pulse 68   Ht 5\' 6"  (1.676 m)   Wt 176 lb (79.8 kg)   SpO2 95%   BMI 28.41 kg/m   Physical  Exam:  Well appearing 77 yo woman, NAD HEENT: Unremarkable Neck:  6 cm JVD, no thyromegally Lymphatics:  No adenopathy Back:  No CVA tenderness Lungs:  Clear with no wheezes HEART:  Regular rate rhythm, no murmurs, no rubs, no clicks Abd:  soft, positive bowel sounds, no organomegally, no rebound, no guarding Ext:  2 plus pulses, no edema, no cyanosis, no clubbing Skin:  No rashes no nodules Neuro:  CN II through XII intact, motor grossly intact  EKG - nsr with atrial pacing  DEVICE  Normal device function.  See PaceArt for details.   Assess/Plan: 1. Sinus node dysfunction - she is atrial pacing and asymptomatic.  2. Chest pain - this is non-exertional and related to position. I have asked her call us if her symptoms begin to occur with exertion. 3. PPM - her medtronic DDD PM is working normally. She has 8 years of battery longevity.  Leonia ReevesGregg Jakai Risse,M.D.

## 2018-04-22 NOTE — Patient Instructions (Signed)

## 2018-04-24 ENCOUNTER — Encounter: Payer: Self-pay | Admitting: Internal Medicine

## 2018-05-16 ENCOUNTER — Other Ambulatory Visit: Payer: Self-pay | Admitting: Family Medicine

## 2018-05-16 ENCOUNTER — Ambulatory Visit
Admission: RE | Admit: 2018-05-16 | Discharge: 2018-05-16 | Disposition: A | Discharge status: Home / Self Care | Payer: Medicare Other | Source: Ambulatory Visit | Attending: Family Medicine | Admitting: Family Medicine

## 2018-05-16 DIAGNOSIS — R0600 Dyspnea, unspecified: Secondary | ICD-10-CM

## 2018-05-16 DIAGNOSIS — R062 Wheezing: Secondary | ICD-10-CM

## 2018-06-14 ENCOUNTER — Encounter: Payer: Self-pay | Admitting: Internal Medicine

## 2018-06-14 ENCOUNTER — Ambulatory Visit (AMBULATORY_SURGERY_CENTER): Payer: Medicare Other | Admitting: Internal Medicine

## 2018-06-14 VITALS — BP 153/78 | HR 64 | Temp 98.2°F | Resp 17 | Ht 66.0 in | Wt 176.0 lb

## 2018-06-14 DIAGNOSIS — R1011 Right upper quadrant pain: Secondary | ICD-10-CM

## 2018-06-14 DIAGNOSIS — Z800 Family history of malignant neoplasm of digestive organs: Secondary | ICD-10-CM | POA: Diagnosis not present

## 2018-06-14 DIAGNOSIS — Z1211 Encounter for screening for malignant neoplasm of colon: Secondary | ICD-10-CM

## 2018-06-14 MED ORDER — SODIUM CHLORIDE 0.9 % IV SOLN: 500.0000 mL | Freq: Once | INTRAVENOUS | Status: DC

## 2018-06-14 NOTE — Progress Notes (Signed)
Report given to PACU, vss 

## 2018-06-14 NOTE — Op Note (Signed)
[facility] Patient Name: Ashley DunMary [name] Procedure Date: 06/14/2018 1:29 PM MRN: [id number] Endoscopist: Iva Booparl E Autum Benfer , MD Age: [age] Referring MD:  Date of Birth: 14-Aug-1941 Gender: Female Account #: [id number] Procedure:                Colonoscopy Indications:              Screening patient at increased risk: Family history                            of colorectal cancer in multiple 1st-degree                            relatives Medicines:                Propofol per Anesthesia, Monitored Anesthesia Care Procedure:                Pre-Anesthesia Assessment:                           - Prior to the procedure, a History and Physical                            was performed, and patient medications and                            allergies were reviewed. The patient's tolerance of                            previous anesthesia was also reviewed. The risks                            and benefits of the procedure and the sedation                            options and risks were discussed with the patient.                            All questions were answered, and informed consent                            was obtained. Prior Anticoagulants: The patient has                            taken no previous anticoagulant or antiplatelet                            agents. ASA Grade Assessment: III - A patient with                            severe systemic disease. After reviewing the risks                            and benefits, the patient was deemed in  satisfactory condition to undergo the procedure.                           After obtaining informed consent, the colonoscope                            was passed under direct vision. Throughout the                            procedure, the patient's blood pressure, pulse, and                            oxygen saturations were monitored continuously. The                            Colonoscope was  introduced through the anus and                            advanced to the the cecum, identified by                            appendiceal orifice and ileocecal valve. The                            colonoscopy was performed without difficulty. The                            patient tolerated the procedure well. The quality                            of the bowel preparation was good. The ileocecal                            valve, appendiceal orifice, and rectum were                            photographed. The bowel preparation used was                            Miralax. Scope In: 1:45:52 PM Scope Out: 2:01:38 PM Scope Withdrawal Time: 0 hours 11 minutes 10 seconds  Total Procedure Duration: 0 hours 15 minutes 46 seconds  Findings:                 The perianal and digital rectal examinations were                            normal.                           Many diverticula were found in the sigmoid colon.                           The exam was otherwise without abnormality on  direct and retroflexion views. Complications:            No immediate complications. Estimated Blood Loss:     Estimated blood loss: none. Impression:               - Severe diverticulosis in the sigmoid colon.                           - The examination was otherwise normal on direct                            and retroflexion views.                           - No specimens collected. Recommendation:           - Patient has a contact number available for                            emergencies. The signs and symptoms of potential                            delayed complications were discussed with the                            patient. Return to normal activities tomorrow.                            Written discharge instructions were provided to the                            patient.                           - Resume previous diet.                           - Continue present  medications.                           - No repeat colonoscopy due to current age (75                            years or older) and the absence of colonic polyps. Iva Boop, MD 06/14/2018 2:09:03 PM This report has been signed electronically.

## 2018-06-14 NOTE — Patient Instructions (Addendum)
I am glad to say no polyps or cancer seen.  At your age it is time to stop routine preventive testing for colon cancer - but would pursue testing to evaluate problems.  You still have diverticulosis - thickened muscle rings and pouches in the colon wall. Please read the handout about this condition.  I appreciate the opportunity to care for you. Iva Booparl E. Gessner, MD, FACG   YOU HAD AN ENDOSCOPIC PROCEDURE TODAY AT THE [facility]:   Refer to the procedure report that was given to you for any specific questions about what was found during the examination.  If the procedure report does not answer your questions, please call your gastroenterologist to clarify.  If you requested that your care partner not be given the details of your procedure findings, then the procedure report has been included in a sealed envelope for you to review at your convenience later.  YOU SHOULD EXPECT: Some feelings of bloating in the abdomen. Passage of more gas than usual.  Walking can help get rid of the air that was put into your GI tract during the procedure and reduce the bloating. If you had a lower endoscopy (such as a colonoscopy or flexible sigmoidoscopy) you may notice spotting of blood in your stool or on the toilet paper. If you underwent a bowel prep for your procedure, you may not have a normal bowel movement for a few days.  Please Note:  You might notice some irritation and congestion in your nose or some drainage.  This is from the oxygen used during your procedure.  There is no need for concern and it should clear up in a day or so.  SYMPTOMS TO REPORT IMMEDIATELY:   Following lower endoscopy (colonoscopy or flexible sigmoidoscopy):  Excessive amounts of blood in the stool  Significant tenderness or worsening of abdominal pains  Swelling of the abdomen that is new, acute  Fever of 100F or higher  Please see handouts given to you on Diverticulosis. Resume Plavix today.  For  urgent or emergent issues, a gastroenterologist can be reached at any hour by calling [phone].   DIET:  We do recommend a small meal at first, but then you may proceed to your regular diet.  Drink plenty of fluids but you should avoid alcoholic beverages for 24 hours.  ACTIVITY:  You should plan to take it easy for the rest of today and you should NOT DRIVE or use heavy machinery until tomorrow (because of the sedation medicines used during the test).    FOLLOW UP: Our staff will call the number listed on your records the next business day following your procedure to check on you and address any questions or concerns that you may have regarding the information given to you following your procedure. If we do not reach you, we will leave a message.  However, if you are feeling well and you are not experiencing any problems, there is no need to return our call.  We will assume that you have returned to your regular daily activities without incident.  If any biopsies were taken you will be contacted by phone or by letter within the next 1-3 weeks.  Please call us at [phone] if you have not heard about the biopsies in 3 weeks.    SIGNATURES/CONFIDENTIALITY: You and/or your care partner have signed paperwork which will be entered into your electronic medical record.  These signatures attest to the fact that that the information above  on your After Visit Summary has been reviewed and is understood.  Full responsibility of the confidentiality of this discharge information lies with you and/or your care-partner.   Thank you for letting us take care of your healthcare needs today.

## 2018-06-14 NOTE — Progress Notes (Signed)
Pt's states no medical or surgical changes since previsit or office visit. 

## 2018-06-15 ENCOUNTER — Telehealth: Payer: Self-pay | Admitting: *Deleted

## 2018-06-15 NOTE — Telephone Encounter (Signed)
  Follow up Call-  Call back number 06/14/2018  Post procedure Call Back phone  # [phone]  Permission to leave phone message Yes  Some recent data might be hidden     Patient questions:  Do you have a fever, pain , or abdominal swelling? No. Pain Score  0 *  Have you tolerated food without any problems? Yes.    Have you been able to return to your normal activities? Yes.    Do you have any questions about your discharge instructions: Diet   No. Medications  No. Follow up visit  No.  Do you have questions or concerns about your Care? No.  Actions: * If pain score is 4 or above: No action needed, pain <4.

## 2018-06-21 DIAGNOSIS — K5730 Diverticulosis of large intestine without perforation or abscess without bleeding: Secondary | ICD-10-CM | POA: Insufficient documentation

## 2018-07-14 ENCOUNTER — Ambulatory Visit (INDEPENDENT_AMBULATORY_CARE_PROVIDER_SITE_OTHER): Payer: Medicare Other | Admitting: *Deleted

## 2018-07-14 DIAGNOSIS — I495 Sick sinus syndrome: Secondary | ICD-10-CM | POA: Diagnosis not present

## 2018-07-14 NOTE — Progress Notes (Signed)
Remote pacemaker transmission.   

## 2018-08-03 LAB — CUP PACEART REMOTE DEVICE CHECK
Battery Remaining Longevity: 92 mo
Battery Voltage: 3.02 V
Brady Statistic AP VP Percent: 0.08 %
Brady Statistic AP VS Percent: 99.17 %
Brady Statistic AS VP Percent: 0 %
Brady Statistic AS VS Percent: 0.75 %
Brady Statistic RA Percent Paced: 99.22 %
Brady Statistic RV Percent Paced: 0.08 %
Date Time Interrogation Session: 20190912152201
Implantable Lead Implant Date: 20070910
Implantable Lead Implant Date: 20070910
Implantable Lead Location: 753859
Implantable Lead Location: 753860
Implantable Lead Model: 5076
Implantable Lead Model: 5076
Implantable Pulse Generator Implant Date: 20170608
Lead Channel Impedance Value: 323 Ohm
Lead Channel Impedance Value: 418 Ohm
Lead Channel Impedance Value: 418 Ohm
Lead Channel Impedance Value: 475 Ohm
Lead Channel Pacing Threshold Amplitude: 0.625 V
Lead Channel Pacing Threshold Amplitude: 0.875 V
Lead Channel Pacing Threshold Pulse Width: 0.4 ms
Lead Channel Pacing Threshold Pulse Width: 0.4 ms
Lead Channel Sensing Intrinsic Amplitude: 13.125 mV
Lead Channel Sensing Intrinsic Amplitude: 13.125 mV
Lead Channel Sensing Intrinsic Amplitude: 2 mV
Lead Channel Sensing Intrinsic Amplitude: 2 mV
Lead Channel Setting Pacing Amplitude: 2 V
Lead Channel Setting Pacing Amplitude: 2.5 V
Lead Channel Setting Pacing Pulse Width: 0.4 ms
Lead Channel Setting Sensing Sensitivity: 2.8 mV

## 2018-10-13 ENCOUNTER — Telehealth: Payer: Self-pay | Admitting: Cardiology

## 2018-10-13 ENCOUNTER — Ambulatory Visit (INDEPENDENT_AMBULATORY_CARE_PROVIDER_SITE_OTHER): Payer: Medicare Other

## 2018-10-13 DIAGNOSIS — I495 Sick sinus syndrome: Secondary | ICD-10-CM | POA: Diagnosis not present

## 2018-10-13 NOTE — Telephone Encounter (Signed)
LMOVM reminding pt to send remote transmission.   

## 2018-10-14 ENCOUNTER — Encounter: Payer: Self-pay | Admitting: Cardiology

## 2018-10-14 NOTE — Progress Notes (Signed)
Remote pacemaker transmission.   

## 2018-11-26 LAB — CUP PACEART REMOTE DEVICE CHECK
Battery Remaining Longevity: 91 mo
Battery Voltage: 3.01 V
Brady Statistic AP VP Percent: 0.08 %
Brady Statistic AP VS Percent: 99.19 %
Brady Statistic AS VP Percent: 0 %
Brady Statistic AS VS Percent: 0.72 %
Brady Statistic RA Percent Paced: 99.25 %
Brady Statistic RV Percent Paced: 0.09 %
Date Time Interrogation Session: 20191213011937
Implantable Lead Implant Date: 20070910
Implantable Lead Implant Date: 20070910
Implantable Lead Location: 753859
Implantable Lead Location: 753860
Implantable Lead Model: 5076
Implantable Lead Model: 5076
Implantable Pulse Generator Implant Date: 20170608
Lead Channel Impedance Value: 304 Ohm
Lead Channel Impedance Value: 418 Ohm
Lead Channel Impedance Value: 418 Ohm
Lead Channel Impedance Value: 456 Ohm
Lead Channel Pacing Threshold Amplitude: 0.75 V
Lead Channel Pacing Threshold Amplitude: 0.875 V
Lead Channel Pacing Threshold Pulse Width: 0.4 ms
Lead Channel Pacing Threshold Pulse Width: 0.4 ms
Lead Channel Sensing Intrinsic Amplitude: 1.125 mV
Lead Channel Sensing Intrinsic Amplitude: 1.125 mV
Lead Channel Sensing Intrinsic Amplitude: 13.375 mV
Lead Channel Sensing Intrinsic Amplitude: 13.375 mV
Lead Channel Setting Pacing Amplitude: 2 V
Lead Channel Setting Pacing Amplitude: 2.5 V
Lead Channel Setting Pacing Pulse Width: 0.4 ms
Lead Channel Setting Sensing Sensitivity: 2.8 mV

## 2019-01-12 ENCOUNTER — Ambulatory Visit (INDEPENDENT_AMBULATORY_CARE_PROVIDER_SITE_OTHER): Payer: Medicare Other | Admitting: *Deleted

## 2019-01-12 DIAGNOSIS — I495 Sick sinus syndrome: Secondary | ICD-10-CM

## 2019-01-13 LAB — CUP PACEART REMOTE DEVICE CHECK
Battery Remaining Longevity: 85 mo
Battery Voltage: 3.01 V
Brady Statistic AP VP Percent: 0.13 %
Brady Statistic AP VS Percent: 98.78 %
Brady Statistic AS VP Percent: 0.01 %
Brady Statistic AS VS Percent: 1.07 %
Brady Statistic RA Percent Paced: 98.87 %
Brady Statistic RV Percent Paced: 0.14 %
Date Time Interrogation Session: 20200313141543
Implantable Lead Implant Date: 20070910
Implantable Lead Implant Date: 20070910
Implantable Lead Location: 753859
Implantable Lead Location: 753860
Implantable Lead Model: 5076
Implantable Lead Model: 5076
Implantable Pulse Generator Implant Date: 20170608
Lead Channel Impedance Value: 323 Ohm
Lead Channel Impedance Value: 418 Ohm
Lead Channel Impedance Value: 437 Ohm
Lead Channel Impedance Value: 456 Ohm
Lead Channel Pacing Threshold Amplitude: 0.625 V
Lead Channel Pacing Threshold Amplitude: 1 V
Lead Channel Pacing Threshold Pulse Width: 0.4 ms
Lead Channel Pacing Threshold Pulse Width: 0.4 ms
Lead Channel Sensing Intrinsic Amplitude: 1.75 mV
Lead Channel Sensing Intrinsic Amplitude: 1.75 mV
Lead Channel Sensing Intrinsic Amplitude: 13.375 mV
Lead Channel Sensing Intrinsic Amplitude: 13.375 mV
Lead Channel Setting Pacing Amplitude: 2 V
Lead Channel Setting Pacing Amplitude: 2.5 V
Lead Channel Setting Pacing Pulse Width: 0.4 ms
Lead Channel Setting Sensing Sensitivity: 2.8 mV

## 2019-01-19 ENCOUNTER — Encounter: Payer: Self-pay | Admitting: Cardiology

## 2019-01-19 NOTE — Progress Notes (Signed)
Remote pacemaker transmission.   

## 2019-01-25 ENCOUNTER — Other Ambulatory Visit: Payer: Self-pay | Admitting: Family Medicine

## 2019-01-25 ENCOUNTER — Ambulatory Visit
Admission: RE | Admit: 2019-01-25 | Discharge: 2019-01-25 | Disposition: A | Discharge status: Home / Self Care | Payer: Medicare Other | Source: Ambulatory Visit | Attending: Family Medicine | Admitting: Family Medicine

## 2019-01-25 ENCOUNTER — Other Ambulatory Visit: Payer: Self-pay

## 2019-01-25 DIAGNOSIS — R0602 Shortness of breath: Secondary | ICD-10-CM

## 2019-03-02 ENCOUNTER — Other Ambulatory Visit: Payer: Self-pay | Admitting: Family Medicine

## 2019-03-02 DIAGNOSIS — Z1231 Encounter for screening mammogram for malignant neoplasm of breast: Secondary | ICD-10-CM

## 2019-03-03 ENCOUNTER — Telehealth: Payer: Self-pay | Admitting: *Deleted

## 2019-03-03 NOTE — Telephone Encounter (Signed)
REFERRAL SENT TO SCHEDULING AND NOTES ON FILE FROM Mady Gemma, PA [facility] [phone].

## 2019-03-13 ENCOUNTER — Ambulatory Visit (INDEPENDENT_AMBULATORY_CARE_PROVIDER_SITE_OTHER): Payer: Medicare Other

## 2019-03-13 ENCOUNTER — Other Ambulatory Visit: Payer: Self-pay

## 2019-03-13 ENCOUNTER — Ambulatory Visit (INDEPENDENT_AMBULATORY_CARE_PROVIDER_SITE_OTHER): Payer: Medicare Other | Admitting: Internal Medicine

## 2019-03-13 ENCOUNTER — Encounter: Payer: Self-pay | Admitting: Internal Medicine

## 2019-03-13 VITALS — BP 130/72 | HR 86 | Temp 98.2°F | Ht 66.0 in | Wt 164.8 lb

## 2019-03-13 DIAGNOSIS — R0609 Other forms of dyspnea: Secondary | ICD-10-CM

## 2019-03-13 DIAGNOSIS — R0600 Dyspnea, unspecified: Secondary | ICD-10-CM

## 2019-03-13 NOTE — Patient Instructions (Addendum)
Pantoprazole 40 mg Take 30- 60 min before your first and last meals of the day   GERD (REFLUX)  is an extremely common cause of respiratory symptoms just like yours , many times with no obvious heartburn at all.    It can be treated with medication, but also with lifestyle changes including elevation of the head of your bed (ideally with 6 -8inch blocks under the headboard of your bed),  Smoking cessation, avoidance of late meals, excessive alcohol, and avoid fatty foods, chocolate, peppermint, colas, red wine, and acidic juices such as orange juice.  NO MINT OR MENTHOL PRODUCTS SO NO COUGH DROPS  USE SUGARLESS CANDY INSTEAD (Jolley ranchers or Stover's or Life Savers) or even ice chips will also do - the key is to swallow to prevent all throat clearing. NO OIL BASED VITAMINS - use powdered substitutes.  Avoid fish oil when coughing.  Please see patient coordinator before you leave today  to schedule cardiology eval    Please remember to go to the lab and x-ray department   for your tests - we will call you with the results when they are available.

## 2019-03-13 NOTE — Progress Notes (Addendum)
[name], female    DOB: 06/15/1941     MRN: [id number]   Brief patient profile:  78 yowm never smoker retired Doctor, general practice  never any resp problems then onset indolent x spring 2019  doe/ cough/ subject wheeze and worse since onset and also when supine > almost sensation of smothering w/in a few min to an hour or two so referred to pulmonary clinic 03/13/2019 by Dr   Gaspar Skeeters with [facility]    History of Present Illness  03/13/2019  Pulmonary/ 1st office eval/[name]  Chief Complaint  Patient presents with   Pulmonary Consult    Referred by Dr. Gaspar Skeeters. Pt c/o SOB for the past year- gets winded when she does work around her house and when she lies down.    Dyspnea:  100 ft and has stop sob/feels fainty/wheezing Cough: not productive but worse supine  Sleep: an hour or two then smothers / sleeps on back with 2 pillows  SABA use: never  Has overt hb on ppi but not ac   No obvious other patterns in  day to day or daytime variability or assoc excess/ purulent sputum or mucus plugs or hemoptysis or cp or chest tightness,  or overt sinus  symptoms.     Also denies any obvious fluctuation of symptoms with weather or environmental changes or other aggravating or alleviating factors except as outlined above   No unusual exposure hx or h/o childhood pna/ asthma or knowledge of premature birth.  Current Allergies, Complete Past Medical History, Past Surgical History, Family History, and Social History were reviewed in [employer] record.  ROS  The following are not active complaints unless bolded Hoarseness, sore throat, dysphagia, dental problems, itching, sneezing,  nasal congestion or discharge of excess mucus or purulent secretions, ear ache,   fever, chills, sweats, unintended wt loss or wt gain, classically pleuritic or exertional cp,  orthopnea pnd or arm/hand swelling  or leg swelling despite use of elastic hose , presyncope, palpitations, abdominal pain,  anorexia, nausea, vomiting, diarrhea  or change in bowel habits or change in bladder habits, change in stools or change in urine, dysuria, hematuria,  rash, arthralgias, visual complaints, headache, numbness, weakness or ataxia or problems with walking or coordination,  change in mood or  memory.              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/10/2014   Hyperlipidemia    Hypertension    Internal and external hemorrhoids without complication    Migraine with visual aura    Myocardial infarction (HCC)    Potassium deficiency    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/09/2016   Stroke (HCC)    TIA (transient ischemic attack) 11/15/2015   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 90 tablet 2   atorvastatin (LIPITOR) 80 MG tablet Take 1 tablet (80 mg total) by mouth every evening. 30 tablet 1   Biotin 1 MG CAPS Take 1 capsule by mouth daily.     CINNAMON PO Take 1 capsule by mouth daily.     clopidogrel (PLAVIX) 75 MG tablet Take 1 tablet (75 mg total) by mouth daily. 30 tablet 11   Coenzyme Q10 200 MG capsule Take 200 mg by mouth daily with lunch.      cyanocobalamin 500 MCG tablet Take 1,000 mcg by mouth daily with lunch. Vitamin B12     ferrous sulfate 325 (65 FE) MG tablet Take 1 tablet by mouth daily.     gabapentin (NEURONTIN) 300 MG capsule Take 600 mg by mouth 3 (three) times daily.     glipiZIDE (GLUCOTROL XL) 5 MG 24 hr tablet daily with breakfast.      glucose blood (ONE TOUCH ULTRA TEST) test strip AS DIRECTED TWICE DAILY     hydrochlorothiazide (HYDRODIURIL) 25 MG tablet Take 25 mg by mouth daily.     losartan (COZAAR) 100 MG tablet Take 1 tablet (100 mg total) by mouth daily. Patient needs to call our office to schedule an appointment with Dr [place] for future refills. 90 tablet 0   Multiple Vitamins-Minerals (ANTIOXIDANT PO) Take 1 tablet by mouth daily with lunch.     Omega-3 Fatty Acids (FISH OIL PO) Take 2 capsules by mouth daily with lunch.     OVER THE COUNTER MEDICATION Take 2 capsules by mouth daily. Vitamins for eyes     pantoprazole (PROTONIX) 40 MG tablet Take 1 tablet (40 mg total) by mouth 2 (two) times daily before a meal. 60 tablet 2   tiZANidine (ZANAFLEX) 2 MG tablet Take 2 mg by mouth as needed.      buPROPion (WELLBUTRIN XL) 300 MG 24 hr tablet Take 300 mg by mouth daily before breakfast.     clonazePAM (KLONOPIN) 0.5 MG tablet Take 0.5-1 mg by mouth 2 (two) times daily. 0.5 mg at lunchtime daily and 1 mg at night     doxepin (SINEQUAN) 10 MG capsule Take 1 capsule by mouth at bedtime.     LYRICA 100 MG capsule TK ONE C PO TID  0   OVER THE COUNTER MEDICATION Take 2 tablets by mouth daily with lunch. Circulation and Vein Support by Feliberto Harts     OVER THE COUNTER MEDICATION Take 2 capsules by mouth daily. Friendly Flora                  terbinafine (LAMISIL) 250 MG tablet Take 250 mg by mouth daily.        Objective:     BP 130/72 (BP Location: Left Arm, Cuff Size: Normal)    Pulse 86    Temp 98.2 F (36.8 C) (Oral)    Ht  (1.676 m)    Wt 164 lb 12.8 oz (74.8 kg)    SpO2 96%     BMI 26.60 kg/m   SpO2: 96 % RA   Pleasant amb wf nad   HEENT: nl dentition, turbinates bilaterally, and oropharynx. Nl external ear canals without cough reflex   NECK :  without JVD/Nodes/TM/ nl carotid upstrokes bilaterally   LUNGS: no acc muscle use,  Nl contour chest with decreased bs in bases R > L    CV:  RRR  no s3 or murmur or increase in P2, and  1-2+ pitting despite elastic hose both LE's  ABD:  soft and nontender with nl inspiratory excursion in the supine position. No bruits or organomegaly appreciated, bowel sounds nl  MS:  Nl gait/ ext warm without deformities, calf tenderness, cyanosis or clubbing No obvious joint restrictions   SKIN: warm and dry without lesions    NEURO:  alert, approp, nl sensorium with  no motor or cerebellar deficits apparent.       CXR PA and Lateral:   03/13/2019 :    I personally reviewed images and agree with radiology impression as follows:    No active cardiopulmonary disease.    Labs ordered/ reviewed:      Chemistry      Component Value Date/Time   NA 139 03/13/2019 1531   K 3.7 03/13/2019 1531   CL 100 03/13/2019 1531   CO2 26 03/13/2019 1531   BUN 19 03/13/2019 1531   CREATININE 0.90 03/13/2019 1531      Component Value Date/Time   CALCIUM 9.8 03/13/2019 1531   ALKPHOS 66 05/10/2016 0342   AST 24 03/13/2019 1531   ALT 22 03/13/2019 1531   BILITOT 0.7 03/13/2019 1531        Lab Results  Component Value Date   WBC 5.5 03/13/2019   HGB 14.1 03/13/2019   HCT 40.3 03/13/2019   MCV 89.8 03/13/2019   PLT 273 03/13/2019       EOS                                                                 77                                   03/13/2019   Lab Results  Component Value Date   DDIMER 0.42 03/13/2019      Lab Results  Component Value Date   TSH 3.04 03/13/2019     No results found for: PROBNP  03/13/2019  = 88             Assessment   DOE (dyspnea on exertion) Onset Spring 2019  - 03/13/2019    Walked RA x one lap =  approx 250 ft - stopped multiple times due to  Sob with sats 98%    Symptoms are  disproportionate to objective findings and not clear to what extent this is actually a pulmonary  problem but pt does appear to have difficult to sort out respiratory symptoms of unknown origin for which  DDX  = almost all start with A and  include Adherence, Ace Inhibitors, Acid Reflux, Active Sinus Disease, Alpha 1 Antitripsin deficiency, Anxiety masquerading as Airways dz,  ABPA,  Allergy(esp in young), Aspiration (esp in elderly), Adverse effects of meds,  Active smoking or Vaping, A bunch of PE's/clot burden (a few small clots can't cause this syndrome unless there is already severe underlying pulm or vascular dz with poor reserve),  Anemia or thyroid disorder, plus two Bs  = Bronchiectasis and Beta blocker use..and one C= CHF  Adherence is always the initial "prime suspect" and is a multilayered concern that requires a "trust but verify" approach in every patient - starting with knowing how to use medications, especially inhalers, correctly, keeping up with refills and understanding the fundamental difference between maintenance and prns vs those medications only taken for a very short course and then stopped and not refilled.   ? Acid (or non-acid) GERD > always difficult to exclude as up to 75% of pts in some series report no assoc GI/ Heartburn symptoms> rec max (24h)  acid suppression and diet restrictions/ reviewed and instructions given in writing.   ? 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    ? Allergy/ asthma > no variability or assoc rhinitis/ cough  Makes less likely   ? Anemia  thyroid dz excluded today  ? Adverse effects of meds > none of the usual suspects listed  ? A bunch of pe's  D dimer nl - while a normal  or high normal value (seen commonly in the elderly or chronically ill)  may miss small peripheral pe, the clot burden with sob is moderately high and the d dimer  has a very high neg pred value if used in this setting.    ? Chf> bnp below 100 while so symptomatic argues against this being related to sob though have not ruled out PAH    >>>> Rec: f/u with cards re doe/ orthopnea/ leg swelling ? Needs off norvasc? But not planning to follow in this clinic so will defer that call        Total time devoted to counseling  > 50 % of initial 60 min office visit:  review case with pt/ directly observed portions of ambulatory 02 saturation study/ discussion of options/alternatives/ personally creating written customized instructions  in presence of pt  then going over those specific  Instructions directly with the pt including how to use all of the meds but in particular covering each new medication in detail and the difference between the maintenance= "automatic" meds and the prns using an action plan format for the latter (If this problem/symptom => do that organization reading Left to right).  Please see AVS from this visit for a full list of these instructions which I personally wrote for this pt and  are unique to this visit.      Sandrea Hughs, MD 03/13/2019

## 2019-03-14 ENCOUNTER — Encounter: Payer: Self-pay | Admitting: Internal Medicine

## 2019-03-14 LAB — CBC WITH DIFFERENTIAL/PLATELET
Absolute Monocytes: 528 cells/uL (ref 200–950)
Basophils Absolute: 11 cells/uL (ref 0–200)
Basophils Relative: 0.2 %
Eosinophils Absolute: 77 cells/uL (ref 15–500)
Eosinophils Relative: 1.4 %
HCT: 40.3 % (ref 35.0–45.0)
Hemoglobin: 14.1 g/dL (ref 11.7–15.5)
Lymphs Abs: 1639 cells/uL (ref 850–3900)
MCH: 31.4 pg (ref 27.0–33.0)
MCHC: 35 g/dL (ref 32.0–36.0)
MCV: 89.8 fL (ref 80.0–100.0)
MPV: 10.7 fL (ref 7.5–12.5)
Monocytes Relative: 9.6 %
Neutro Abs: 3245 cells/uL (ref 1500–7800)
Neutrophils Relative %: 59 %
Platelets: 273 10*3/uL (ref 140–400)
RBC: 4.49 10*6/uL (ref 3.80–5.10)
RDW: 13 % (ref 11.0–15.0)
Total Lymphocyte: 29.8 %
WBC: 5.5 10*3/uL (ref 3.8–10.8)

## 2019-03-14 LAB — BASIC METABOLIC PANEL
BUN: 19 mg/dL (ref 7–25)
CO2: 26 mmol/L (ref 20–32)
Calcium: 9.8 mg/dL (ref 8.6–10.4)
Chloride: 100 mmol/L (ref 98–110)
Creat: 0.9 mg/dL (ref 0.60–0.93)
Glucose, Bld: 90 mg/dL (ref 65–99)
Potassium: 3.7 mmol/L (ref 3.5–5.3)
Sodium: 139 mmol/L (ref 135–146)

## 2019-03-14 LAB — HEPATIC FUNCTION PANEL
AG Ratio: 1.7 (calc) (ref 1.0–2.5)
ALT: 22 U/L (ref 6–29)
AST: 24 U/L (ref 10–35)
Albumin: 4.4 g/dL (ref 3.6–5.1)
Alkaline phosphatase (APISO): 84 U/L (ref 37–153)
Bilirubin, Direct: 0.2 mg/dL (ref 0.0–0.2)
Globulin: 2.6 g/dL (calc) (ref 1.9–3.7)
Indirect Bilirubin: 0.5 mg/dL (calc) (ref 0.2–1.2)
Total Bilirubin: 0.7 mg/dL (ref 0.2–1.2)
Total Protein: 7 g/dL (ref 6.1–8.1)

## 2019-03-14 LAB — TSH: TSH: 3.04 mIU/L (ref 0.40–4.50)

## 2019-03-14 LAB — BRAIN NATRIURETIC PEPTIDE: Brain Natriuretic Peptide: 88 pg/mL (ref <100)

## 2019-03-14 LAB — D-DIMER, QUANTITATIVE: D-Dimer, Quant: 0.42 mcg/mL FEU (ref <0.50)

## 2019-03-14 NOTE — Progress Notes (Signed)
Left msg on machine with results

## 2019-03-14 NOTE — Progress Notes (Signed)
Left detailed msg with results

## 2019-03-14 NOTE — Assessment & Plan Note (Addendum)
[facility] Spring 2019  - 03/13/2019   Walked RA x one lap =  approx 250 ft - stopped multiple times due to  Sob with sats 98%    Symptoms are  disproportionate to objective findings and not clear to what extent this is actually a pulmonary  problem but pt does appear to have difficult to sort out respiratory symptoms of unknown origin for which  DDX  = almost all start with A and  include Adherence, Ace Inhibitors, Acid Reflux, Active Sinus Disease, Alpha 1 Antitripsin deficiency, Anxiety masquerading as Airways dz,  ABPA,  Allergy(esp in young), Aspiration (esp in elderly), Adverse effects of meds,  Active smoking or Vaping, A bunch of PE's/clot burden (a few small clots can't cause this syndrome unless there is already severe underlying pulm or vascular dz with poor reserve),  Anemia or thyroid disorder, plus two Bs  = Bronchiectasis and Beta blocker use..and one C= CHF   Adherence is always the initial "prime suspect" and is a multilayered concern that requires a "trust but verify" approach in every patient - starting with knowing how to use medications, especially inhalers, correctly, keeping up with refills and understanding the fundamental difference between maintenance and prns vs those medications only taken for a very short course and then stopped and not refilled.   ? Acid (or non-acid) GERD > always difficult to exclude as up to 75% of pts in some series report no assoc GI/ Heartburn symptoms> rec max (24h)  acid suppression and diet restrictions/ reviewed and instructions given in writing.   ? 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    ? Allergy/ asthma > no variability or assoc rhinitis/ cough   Makes less likely   ? Anemia thyroid dz excluded today  ? Adverse effects of meds > none of the usual suspects listed  ? A bunch of pe's  D dimer nl - while a normal  or high normal value (seen commonly in the elderly or chronically ill)  may miss small peripheral pe, the clot burden with sob is moderately high and the d dimer  has a very high neg pred value if used in this setting.    ? Chf> bnp below 100 while so symptomatic argues against this being related to sob though have not ruled out PAH    >>>> Rec: f/u with cards re doe/ orthopnea/ leg swelling ? Needs off norvasc? But not planning to follow in this clinic so will defer that call    Total time devoted to counseling  > 50 % of initial 60 min office visit:  review case with pt/ directly observed portions of ambulatory 02 saturation study/ discussion of options/alternatives/ personally creating written customized instructions  in presence of pt  then going over those specific  Instructions directly with the pt including how to use all of the meds but in particular covering each new medication in detail and the difference between the maintenance= "automatic" meds and the prns using an action plan format for the latter (If this problem/symptom => do that organization reading Left to right).  Please see AVS from this visit for a full list of these instructions which I personally wrote for this  pt and  are unique to this visit.

## 2019-03-15 ENCOUNTER — Telehealth: Payer: Self-pay | Admitting: Physician Assistant

## 2019-03-15 NOTE — Telephone Encounter (Signed)
Left message for patient to call me back - put her through to my work extension -  I need to do previisit call and obtain consent

## 2019-03-17 ENCOUNTER — Encounter: Payer: Self-pay | Admitting: Physician Assistant

## 2019-03-17 ENCOUNTER — Other Ambulatory Visit: Payer: Self-pay

## 2019-03-17 ENCOUNTER — Telehealth (INDEPENDENT_AMBULATORY_CARE_PROVIDER_SITE_OTHER): Payer: Medicare Other | Admitting: Physician Assistant

## 2019-03-17 VITALS — BP 156/86 | HR 80 | Ht 66.0 in | Wt 164.0 lb

## 2019-03-17 DIAGNOSIS — R0602 Shortness of breath: Secondary | ICD-10-CM

## 2019-03-17 DIAGNOSIS — Z950 Presence of cardiac pacemaker: Secondary | ICD-10-CM

## 2019-03-17 DIAGNOSIS — I10 Essential (primary) hypertension: Secondary | ICD-10-CM

## 2019-03-17 DIAGNOSIS — E785 Hyperlipidemia, unspecified: Secondary | ICD-10-CM

## 2019-03-17 DIAGNOSIS — I2510 Atherosclerotic heart disease of native coronary artery without angina pectoris: Secondary | ICD-10-CM

## 2019-03-17 MED ORDER — ISOSORBIDE MONONITRATE ER 30 MG PO TB24: 15.0000 mg | ORAL_TABLET | Freq: Every day | ORAL | 1 refills | Status: DC

## 2019-03-17 MED ORDER — METOPROLOL TARTRATE 50 MG PO TABS: ORAL_TABLET | ORAL | 0 refills | Status: DC

## 2019-03-17 NOTE — Telephone Encounter (Signed)
..   Virtual Visit Pre-Appointment Phone Call  "(Name), I am calling you today to discuss your upcoming appointment. We are currently trying to limit exposure to the virus that causes COVID-19 by seeing patients at home rather than in the office."  1. "What is the BEST phone number to call the day of the visit?" - include this in appointment notes  2. Do you have or have access to (through a family member/friend) a smartphone with video capability that we can use for your visit?" a. If yes - list this number in appt notes as cell (if different from BEST phone #) and list the appointment type as a VIDEO visit in appointment notes b. If no - list the appointment type as a PHONE visit in appointment notes  3. Confirm consent - "In the setting of the current Covid19 crisis, you are scheduled for a (phone or video) visit with your provider on (date) at (time).  Just as we do with many in-office visits, in order for you to participate in this visit, we must obtain consent.  If you'd like, I can send this to your mychart (if signed up) or email for you to review.  Otherwise, I can obtain your verbal consent now.  All virtual visits are billed to your insurance company just like a normal visit would be.  By agreeing to a virtual visit, we'd like you to understand that the technology does not allow for your provider to perform an examination, and thus may limit your provider's ability to fully assess your condition. If your provider identifies any concerns that need to be evaluated in person, we will make arrangements to do so.  Finally, though the technology is pretty good, we cannot assure that it will always work on either your or our end, and in the setting of a video visit, we may have to convert it to a phone-only visit.  In either situation, we cannot ensure that we have a secure connection.  Are you willing to proceed?" STAFF: Did the patient verbally acknowledge consent to telehealth visit? Document  YES/NO here: Yes  4. Advise patient to be prepared - "Two hours prior to your appointment, go ahead and check your blood pressure, pulse, oxygen saturation, and your weight (if you have the equipment to check those) and write them all down. When your visit starts, your provider will ask you for this information. If you have an Apple Watch or Kardia device, please plan to have heart rate information ready on the day of your appointment. Please have a pen and paper handy nearby the day of the visit as well."  5. Give patient instructions for MyChart download to smartphone OR [email or web address] as below if video visit (depending on what platform provider is using)  6. Inform patient they will receive a phone call 15 minutes prior to their appointment time (may be from unknown caller ID) so they should be prepared to answer    TELEPHONE CALL NOTE  [name] has been deemed a candidate for a follow-up tele-health visit to limit community exposure during the Covid-19 pandemic. I spoke with the patient via phone to ensure availability of phone/video source, confirm preferred email & phone number, and discuss instructions and expectations.  I reminded [name] to be prepared with any vital sign and/or heart rhythm information that could potentially be obtained via home monitoring, at the time of her visit. I reminded [name] to expect a phone call prior  to her visit.  Valrie Hart, CMA 03/17/2019 10:16 AM       FULL LENGTH CONSENT FOR TELE-HEALTH VISIT   I hereby voluntarily request, consent and authorize [facility] and its employed or contracted physicians, physician assistants, nurse practitioners or other licensed health care professionals (the Practitioner), to provide me with telemedicine health care services (the Services") as deemed necessary by the treating Practitioner. I acknowledge and consent to receive the Services by the  Practitioner via telemedicine. I understand that the telemedicine visit will involve communicating with the Practitioner through live audiovisual communication technology and the disclosure of certain medical information by electronic transmission. I acknowledge that I have been given the opportunity to request an in-person assessment or other available alternative prior to the telemedicine visit and am voluntarily participating in the telemedicine visit.  I understand that I have the right to withhold or withdraw my consent to the use of telemedicine in the course of my care at any time, without affecting my right to future care or treatment, and that the Practitioner or I may terminate the telemedicine visit at any time. I understand that I have the right to inspect all information obtained and/or recorded in the course of the telemedicine visit and may receive copies of available information for a reasonable fee.  I understand that some of the potential risks of receiving the Services via telemedicine include:   Delay or interruption in medical evaluation due to technological equipment failure or disruption;  Information transmitted may not be sufficient (e.g. poor resolution of images) to allow for appropriate medical decision making by the Practitioner; and/or   In rare instances, security protocols could fail, causing a breach of personal health information.  Furthermore, I acknowledge that it is my responsibility to provide information about my medical history, conditions and care that is complete and accurate to the best of my ability. I acknowledge that Practitioner's advice, recommendations, and/or decision may be based on factors not within their control, such as incomplete or inaccurate data provided by me or distortions of diagnostic images or specimens that may result from electronic transmissions. I understand that the practice of medicine is not an exact science and that Practitioner makes  no warranties or guarantees regarding treatment outcomes. I acknowledge that I will receive a copy of this consent concurrently upon execution via email to the email address I last provided but may also request a printed copy by calling the office of [facility].    I understand that my insurance will be billed for this visit.   I have read or had this consent read to me.  I understand the contents of this consent, which adequately explains the benefits and risks of the Services being provided via telemedicine.   I have been provided ample opportunity to ask questions regarding this consent and the Services and have had my questions answered to my satisfaction.  I give my informed consent for the services to be provided through the use of telemedicine in my medical care  By participating in this telemedicine visit I agree to the above.

## 2019-03-17 NOTE — Patient Instructions (Addendum)
Medication Instructions:  Your physician has recommended you make the following change in your medication:  1.  START Imdur 30 mg taking only 1/2 tablet daily  If you need a refill on your cardiac medications before your next appointment, please call your pharmacy.   Lab work: None ordered  If you have labs (blood work) drawn today and your tests are completely normal, you will receive your results only by: Marland Kitchen MyChart Message (if you have MyChart) OR . A paper copy in the mail If you have any lab test that is abnormal or we need to change your treatment, we will call you to review the results.  Testing/Procedures: Your physician has requested that you have cardiac CT. Cardiac computed tomography (CT) is a painless test that uses an x-ray machine to take clear, detailed pictures of your heart. For further information please visit [email or web address] Please follow instruction sheet BELOW:  INSTRUCTIONS FOR CT:  Please arrive at the [facility] main entrance of [facility] at xx:xx AM (30-45 minutes prior to test start time)  [facility] [address] [place], [place] [address] [phone]  Proceed to the [facility] Radiology Department (First Floor).  Please follow these instructions carefully (unless otherwise directed):  On the Night Before the Test: . Be sure to Drink plenty of water. . Do not consume any caffeinated/decaffeinated beverages or chocolate 12 hours prior to your test. . Do not take any antihistamines 12 hours prior to your test. . If you take GLIPIZIDE do not take 24 hours prior to test.  On the Day of the Test: . Drink plenty of water. Do not drink any water within one hour of the test. . Do not eat any food 4 hours prior to the test. . You may take your regular medications prior to the test.  . Take metoprolol (Lopressor) two hours prior to test. THIS HAS BEEN SENT TO YOUR PHARMACY [facility] IN [place]      After the  Test: . Drink plenty of water. . After receiving IV contrast, you may experience a mild flushed feeling. This is normal. . On occasion, you may experience a mild rash up to 24 hours after the test. This is not dangerous. If this occurs, you can take Benadryl 25 mg and increase your fluid intake. . If you experience trouble breathing, this can be serious. If it is severe call 911 IMMEDIATELY. If it is mild, please call our office. . If you take any of these medications: Glipizide/Metformin, Avandament, Glucavance, please do not take 48 hours after completing test.   Follow-Up: Your physician recommends that you schedule a follow-up appointment in: YOU HAVE BEEN SCHEDULED FOR ANOTHER VIRTUAL VISIT VIA PHONE CALL WITH DAYNA DUNN, PA-C, 04/28/2019 AT 10:30.    Any Other Special Instructions Will Be Listed Below (If Applicable).

## 2019-03-17 NOTE — Progress Notes (Signed)
Virtual Visit via Telephone Note   This visit type was conducted due to national recommendations for restrictions regarding the COVID-19 Pandemic (e.g. social distancing) in an effort to limit this patient's exposure and mitigate transmission in our community.  Due to her co-morbid illnesses, this patient is at least at moderate risk for complications without adequate follow up.  This format is felt to be most appropriate for this patient at this time.  The patient did not have access to video technology/had technical difficulties with video requiring transitioning to audio format only (telephone).  All issues noted in this document were discussed and addressed.  No physical exam could be performed with this format.  Please refer to the patient's chart for her  consent to telehealth for [facility].   Date:  03/17/2019   ID:  [name] [birth year], MRN [id number]  Patient Location: Home Provider Location: Home  PCP:  Richmond Campbell., PA-C  Cardiologist:  Peter Swaziland, MD  Electrophysiologist:  Lewayne Bunting, MD   Evaluation Performed:  Follow-Up Visit  Chief Complaint:  F/u shortness of breath  History of Present Illness:    [name] is a 78 y.o. female with HTN, sinus node dysfunction s/p MDT PPM (gen change 2017), migraine, DM, fibromyalgia, HLD, TIA, DM, minimal CAD by cath 2016 who presents for evaluation of shortness of breath. She was referred back by primary care and also recently saw pulmonology as well.  She has prior history of evalulation for SOB and chest pain. Cardiac workup was previously unrevealing. Cath 04/2015 showed 10% prox RCA and 25% prox LAD with normal LVEF and normal LVEDP. She was switched from ACEI/ARB and referred to pulmonary as symptoms were felt to be noncardiac. Last echo 05/2016 showed EF 60-65%, otherwise OK. She was recently seen by pulmonary for evaluation of SOB which seemed out of proportion to objective pulmonary  findings. She did not have any desaturations with activity. She was started on PPI in case of silent GERD. Dr. Sherene Sires also suggests that anxiety, depression and deconditioning be higher on the patient's differential as well. Labs 03/13/19 showed normal BNP, D-dimer, TSH, CBC, CMET. Last lipids in 2017 showed LDL 84.  She is seen virtually today for this issue. She affirms it is the same dyspnea that prompted original cardiac work-up, just seemingly more noticeable with exertion. She also has sensation of orthopnea after a while of lying. She has occasional cough but not commonly. She reports chronic mild dependent edema which she states is generally well controlled. She uses compression stockings. This has been present for years without change and typically goes down in the evening. This was documented as 1-2+ in recent pulm note but the patient indicates this was only per verbal report at the time. She has not had any hemoptysis or weight loss. She does feel some occasional atypical chest discomfort at night that can last for hours but not with exertion. This is also indicative of the prior discomfort she had in previous cardiology encounters. Although anxiety/depression were mentioned in pulmonary note, the patient does not exhibit any unusual features to suggest an exacerbation. She does admit she's basically stopped doing activity because she hasn't felt like it, so deconditioning could certainly be playing a role. The patient does not have symptoms concerning for COVID-19 infection (fever, chills).    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/2014   Hyperlipidemia    Hypertension    Internal and external hemorrhoids  without complication    Migraine with visual aura    Myocardial infarction (HCC)    Potassium deficiency    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/09/2016   TIA (transient ischemic attack) 11/15/2015   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   Past Surgical History:  Procedure Laterality Date   ABDOMINAL HYSTERECTOMY     APPENDECTOMY     bil carple tunnel surgery     CARDIAC CATHETERIZATION N/A 04/09/2015   Procedure: Left Heart Cath and Coronary Angiography;  Surgeon: Peter M Swaziland, MD;  Location: [facility] INVASIVE CV LAB;  Service: Cardiovascular;  Laterality: N/A;   COLONOSCOPY  multiple   EP IMPLANTABLE DEVICE N/A 04/09/2016   Procedure: PPM Generator Changeout;  Surgeon: Marinus Maw, MD;  Location: [facility] INVASIVE CV LAB;  Service: Cardiovascular;  Laterality: N/A;   ESOPHAGEAL MANOMETRY N/A 08/02/2017   Procedure: ESOPHAGEAL MANOMETRY (EM);  Surgeon: Napoleon Form, MD;  Location: [facility] ENDOSCOPY;  Service: Endoscopy;  Laterality: N/A;   KNEE SURGERY     right x 2   NECK SURGERY     x2   PACEMAKER INSERTION     PH IMPEDANCE STUDY N/A 08/02/2017   Procedure: PH IMPEDANCE STUDY;  Surgeon: Napoleon Form, MD;  Location: [facility] ENDOSCOPY;  Service: Endoscopy;  Laterality: N/A;   UPPER GASTROINTESTINAL ENDOSCOPY       Current Meds  Medication Sig   amLODipine (NORVASC) 5 MG tablet TK 1 T PO QD FOR BLOOD PRESSURE   atorvastatin (LIPITOR) 80 MG tablet Take 1 tablet (80 mg total) by mouth every evening.   Biotin 1 MG CAPS Take 1 capsule by mouth daily.   buPROPion (WELLBUTRIN XL) 300 MG 24 hr tablet Take 300 mg by mouth daily.   CINNAMON PO Take 1 capsule by mouth daily.   clopidogrel (PLAVIX) 75 MG tablet Take 1 tablet (75 mg total) by mouth daily.   Coenzyme Q10 200 MG capsule Take 200 mg by mouth daily with lunch.   cyanocobalamin 500 MCG tablet Take 1,000 mcg by mouth daily with lunch. Vitamin B12   ferrous sulfate 325 (65 FE) MG  tablet Take 1 tablet by mouth daily.   gabapentin (NEURONTIN) 300 MG capsule Take 900 mg by mouth in the morning and 900 mg by mouth in the afternoon and 1200 mg at night   glipiZIDE (GLUCOTROL XL) 5 MG 24 hr tablet Take 5 mg by mouth 2 (two) times a day.    glucose blood (ONE TOUCH ULTRA TEST) test strip AS DIRECTED TWICE DAILY   hydrochlorothiazide (HYDRODIURIL) 25 MG tablet Take 25 mg by mouth daily.   losartan (COZAAR) 100 MG tablet Take 1 tablet (100 mg total) by mouth daily. Patient needs to call our office to schedule an appointment with Dr Swaziland for future refills.   Multiple Vitamins-Minerals (ANTIOXIDANT PO) Take 1 tablet by mouth daily with lunch.   Omega-3 Fatty Acids (FISH OIL PO) Take 2 capsules by mouth daily with lunch.   OVER THE COUNTER MEDICATION Take 2 capsules by mouth daily. Vitamins for eyes   pantoprazole (PROTONIX) 40 MG tablet Take 1 tablet (40 mg total) by mouth 2 (two) times daily before  a meal.   tiZANidine (ZANAFLEX) 2 MG tablet Take 2 mg by mouth as needed.      Allergies:   Citalopram; Codeine; Cymbalta [duloxetine hcl]; Metformin and related; Paroxetine hcl; Trazodone and nefazodone; and Caffeine   Social History   Tobacco Use   Smoking status: Never Smoker   Smokeless tobacco: Never Used  Substance Use Topics   Alcohol use: No    Alcohol/week: 0.0 standard drinks   Drug use: No     Family Hx: The patient's family history includes Breast cancer in her maternal aunt; Colon cancer in her brother, father, and sister; Colon polyps in her sister; Coronary artery disease in her father; Crohn's disease in her sister; Dementia in her daughter and mother; Diabetes in her cousin, daughter, father, maternal aunt, and maternal uncle; Heart attack in her brother, daughter, and father; Irritable bowel syndrome in her sister; Kidney disease in her daughter; Lung cancer in her father and mother; Prostate cancer in her father. There is no history of  Hypertension, Esophageal cancer, Rectal cancer, or Stomach cancer.  ROS:   Please see the history of present illness.    All other systems reviewed and are negative.   Prior CV studies:   Most recent pertinent cardiac studies are outlined above.   Labs/Other Tests and Data Reviewed:    EKG:  An ECG dated 04/22/18 was personally reviewed today and demonstrated:  atrial paced rhytmh with nonspecific STW changes - seen on prior tracing as well  Recent Labs: 03/13/2019: ALT 22; Brain Natriuretic Peptide 88; BUN 19; Creat 0.90; Hemoglobin 14.1; Platelets 273; Potassium 3.7; Sodium 139; TSH 3.04   Recent Lipid Panel Lab Results  Component Value Date/Time   CHOL 161 05/11/2016 06:38 AM   CHOL 158 11/15/2015 02:15 PM   TRIG 134 05/11/2016 06:38 AM   HDL 50 05/11/2016 06:38 AM   HDL 50 11/15/2015 02:15 PM   CHOLHDL 3.2 05/11/2016 06:38 AM   LDLCALC 84 05/11/2016 06:38 AM   LDLCALC 82 11/15/2015 02:15 PM    Wt Readings from Last 3 Encounters:  03/17/19 164 lb (74.4 kg)  03/13/19 164 lb 12.8 oz (74.8 kg)  06/14/18 176 lb (79.8 kg)     Objective:    Vital Signs:  BP (!) 156/86    Pulse 80    Ht 5\' 6"  (1.676 m)    Wt 164 lb (74.4 kg)    SpO2 94%    BMI 26.47 kg/m    VITAL SIGNS:  reviewed  General - pleasant female in no acute distress, voice sounds a little hoarse at times Pulm - No labored breathing, no coughing during visit, no audible wheezing, speaking in full sentences Neuro - A+Ox3, no slurred speech, answers questions appropriately Psych - Pleasant affect    ASSESSMENT & PLAN:    1. Shortness of breath - mostly with exertion but also with component of orthopnea despite totally normal BNP. She is of normal weight so would not expect a falsely low BNP. She has chronic mild edema but reports this is unchanged for many years. This probably has some relationship to her amlodipine. Since her BP is running on the higher side today, I would not make any acute change to this  medicine. Recent in-person notes by other providers did not document any rhythm or murmur irregularities. Her rhythm has been monitored by her PPM as well. Question whether we need to consider microvascular angina in the differential. Since prior workup was rather unrevealing, will try a  trial of Imdur 15mg  daily starting lower dose given h/o headaches. Will arrange cardiac CTA to not only re-evaluate coronaries but also ensure no unusual pulmonary parenchymal findings to explain symptoms. She has history of abnormal nuclear stress test in the past so repeat nuc would not be ideal. If she has no improvement with the Imdur, we could try switching HCTZ to Lasix just as a trial but in setting of totally normal BNP, stable weight, and CXR that hasn't shown any vascular congestion, this seems less likely to be HF given degree of dyspnea out of proportion to objective findings. Lastly, I stressed the importance of gradually building up endurance so that we have a better sense for how deconditioning is affecting her symptoms - whether the dyspnea represents pathology or simply inactivity. Can consider echo if other w/u unrevealing. 2. Sinus node dysfunction s/p PPM - followed by Dr. Ladona Ridgel. If workup for the above is unrevealing, I encouraged that she discuss with Dr. Ladona Ridgel at her upcoming follow-up whether there is anything in her pacemaker settings that could be contributing to dyspnea. 3. Essential HTN - BP running higher today which is unusual for this. She will keep an eye on this and let us know how it is running when she sends message next week. It usually is controlled per her report. 4. HLD - this is monitored by primary care.  COVID-19 Education: The signs and symptoms of COVID-19 were discussed with the patient and how to seek care for testing (follow up with PCP or arrange E-visit).  The importance of social distancing was discussed today.  Time:   Today, I have spent 22 minutes with the patient with  telehealth technology discussing the above problems.     Medication Adjustments/Labs and Tests Ordered: Current medicines are reviewed at length with the patient today.  Concerns regarding medicines are outlined above.   Disposition:  Follow up 6 weeks with me virtually  Signed, Laurann Montana, PA-C  03/17/2019 10:38 AM    [facility]

## 2019-03-24 ENCOUNTER — Telehealth: Payer: Self-pay | Admitting: Physician Assistant

## 2019-03-24 NOTE — Telephone Encounter (Signed)
Left message to call and schedule ct. °

## 2019-03-28 ENCOUNTER — Telehealth: Payer: Self-pay | Admitting: Internal Medicine

## 2019-03-28 NOTE — Telephone Encounter (Signed)
New message   [facility] for pt to return call about appt on 06.23.20 with Dr. Ladona Ridgel. Need to RS appt. If pt returns call, please reach out via secure chat. I will speak to pt.

## 2019-04-04 ENCOUNTER — Telehealth: Payer: Self-pay | Admitting: Physician Assistant

## 2019-04-04 NOTE — Telephone Encounter (Signed)
Call patient on 03-23-19, 03-30-19 and 04-04-19.  Left message to call and schedule ct.

## 2019-04-04 NOTE — Addendum Note (Signed)
Addended by: Burnetta Sabin on: 04/04/2019 11:07 AM   Modules accepted: Orders

## 2019-04-04 NOTE — Telephone Encounter (Signed)
Follow up:   Patient returning call back concering a appt. Please call patient back.

## 2019-04-06 ENCOUNTER — Telehealth (HOSPITAL_COMMUNITY): Payer: Self-pay | Admitting: Emergency Medicine

## 2019-04-06 NOTE — Telephone Encounter (Signed)
Left message on voicemail with name and callback number Lyndsie Wallman RN Navigator Cardiac Imaging [facility] [phone] Office [phone] Cell  

## 2019-04-10 ENCOUNTER — Ambulatory Visit (HOSPITAL_COMMUNITY)
Admission: RE | Admit: 2019-04-10 | Discharge: 2019-04-10 | Disposition: A | Discharge status: Home / Self Care | Payer: Medicare Other | Source: Ambulatory Visit | Attending: Physician Assistant | Admitting: Physician Assistant

## 2019-04-10 ENCOUNTER — Encounter (HOSPITAL_COMMUNITY): Payer: Self-pay

## 2019-04-10 ENCOUNTER — Other Ambulatory Visit: Payer: Self-pay

## 2019-04-10 DIAGNOSIS — I2510 Atherosclerotic heart disease of native coronary artery without angina pectoris: Secondary | ICD-10-CM | POA: Insufficient documentation

## 2019-04-10 DIAGNOSIS — Z950 Presence of cardiac pacemaker: Secondary | ICD-10-CM

## 2019-04-10 DIAGNOSIS — R0602 Shortness of breath: Secondary | ICD-10-CM | POA: Diagnosis present

## 2019-04-10 MED ORDER — NITROGLYCERIN 0.4 MG SL SUBL
0.8000 mg | SUBLINGUAL_TABLET | Freq: Once | SUBLINGUAL | Status: AC
  Administered 2019-04-10: 0.8 mg via SUBLINGUAL
  Filled 2019-04-10: qty 25

## 2019-04-10 MED ORDER — SODIUM CHLORIDE 0.9% FLUSH: 10.0000 mL | INTRAVENOUS | Status: DC | PRN

## 2019-04-10 MED ORDER — IOHEXOL 350 MG/ML SOLN
80.0000 mL | Freq: Once | INTRAVENOUS | Status: AC | PRN
  Administered 2019-04-10: 80 mL via INTRAVENOUS

## 2019-04-10 MED ORDER — METOPROLOL TARTRATE 5 MG/5ML IV SOLN
INTRAVENOUS | Status: AC
  Filled 2019-04-10: qty 5

## 2019-04-10 MED ORDER — NITROGLYCERIN 0.4 MG SL SUBL
SUBLINGUAL_TABLET | SUBLINGUAL | Status: AC
  Administered 2019-04-10: 0.8 mg via SUBLINGUAL
  Filled 2019-04-10: qty 2

## 2019-04-10 MED ORDER — SODIUM CHLORIDE 0.9% FLUSH: 10.0000 mL | Freq: Two times a day (BID) | INTRAVENOUS | Status: DC

## 2019-04-13 ENCOUNTER — Ambulatory Visit (INDEPENDENT_AMBULATORY_CARE_PROVIDER_SITE_OTHER): Payer: Medicare Other | Admitting: *Deleted

## 2019-04-13 DIAGNOSIS — I495 Sick sinus syndrome: Secondary | ICD-10-CM | POA: Diagnosis not present

## 2019-04-13 LAB — CUP PACEART REMOTE DEVICE CHECK
Battery Remaining Longevity: 83 mo
Battery Voltage: 3.01 V
Brady Statistic AP VP Percent: 0.08 %
Brady Statistic AP VS Percent: 99.52 %
Brady Statistic AS VP Percent: 0 %
Brady Statistic AS VS Percent: 0.4 %
Brady Statistic RA Percent Paced: 99.58 %
Brady Statistic RV Percent Paced: 0.08 %
Date Time Interrogation Session: 20200611124050
Implantable Lead Implant Date: 20070910
Implantable Lead Implant Date: 20070910
Implantable Lead Location: 753859
Implantable Lead Location: 753860
Implantable Lead Model: 5076
Implantable Lead Model: 5076
Implantable Pulse Generator Implant Date: 20170608
Lead Channel Impedance Value: 323 Ohm
Lead Channel Impedance Value: 437 Ohm
Lead Channel Impedance Value: 437 Ohm
Lead Channel Impedance Value: 475 Ohm
Lead Channel Pacing Threshold Amplitude: 0.625 V
Lead Channel Pacing Threshold Amplitude: 0.875 V
Lead Channel Pacing Threshold Pulse Width: 0.4 ms
Lead Channel Pacing Threshold Pulse Width: 0.4 ms
Lead Channel Sensing Intrinsic Amplitude: 17.375 mV
Lead Channel Sensing Intrinsic Amplitude: 2.375 mV
Lead Channel Setting Pacing Amplitude: 2 V
Lead Channel Setting Pacing Amplitude: 2.5 V
Lead Channel Setting Pacing Pulse Width: 0.4 ms
Lead Channel Setting Sensing Sensitivity: 2.8 mV

## 2019-04-19 ENCOUNTER — Encounter: Payer: Self-pay | Admitting: Cardiology

## 2019-04-19 NOTE — Progress Notes (Signed)
Remote pacemaker transmission.   

## 2019-04-25 ENCOUNTER — Encounter: Payer: Medicare Other | Admitting: Internal Medicine

## 2019-04-25 ENCOUNTER — Encounter: Payer: Self-pay | Admitting: Physician Assistant

## 2019-04-25 NOTE — Progress Notes (Signed)
Virtual Visit via Telephone Note   This visit type was conducted due to national recommendations for restrictions regarding the COVID-19 Pandemic (e.g. social distancing) in an effort to limit this patient's exposure and mitigate transmission in our community.  Due to her co-morbid illnesses, this patient is at least at moderate risk for complications without adequate follow up.  This format is felt to be most appropriate for this patient at this time.  The patient did not have access to video technology/had technical difficulties with video requiring transitioning to audio format only (telephone).  All issues noted in this document were discussed and addressed.  No physical exam could be performed with this format.  Please refer to the patient's chart for her  consent to telehealth for [facility].   Date:  04/27/2019   ID:  Ashley EulerMary Frances [name], DOB Jul 26, 1941, MRN [id number]  Patient Location: Home Provider Location: Home  PCP:  [name], Kristen W., PA-C  Cardiologist:  [name], MD  Electrophysiologist:  Lewayne BuntingGregg Taylor, MD   Evaluation Performed:  Follow-Up Visit  Chief Complaint:  F/u DOE  History of Present Illness:    [name] is a 78 y.o. female with HTN, sinus node dysfunction s/p MDT PPM (gen change 2017), migraine, DM, fibromyalgia, HLD, TIA, DM, hiatal hernia, minimal CAD by cath 2016, chronic edema who presents for f/u of shortness of breath. She was referred back by primary care and also recently saw pulmonology as well.  She has prior history of evalulation for SOB and chest pain. Cardiac workup was previously unrevealing. Cath 04/2015 showed 10% prox RCA and 25% prox LAD with normal LVEF and normal LVEDP. She was switched from ACEI/ARB and referred to pulmonary as symptoms were felt to be noncardiac. Last 2d echo 05/2016 showed EF 60-65%, otherwise OK. She was recently seen by pulmonary for evaluation of SOB which seemed out of proportion to objective  pulmonary findings. She did not have any desaturations with activity. She was started on PPI in case of silent GERD. Dr. Sherene SiresWert also suggested that anxiety, depression and deconditioning be higher on the patient's differential as well. Labs 03/13/19 showed normal BNP, D-dimer, TSH, CBC, CMET. Last lipids in 2017 showed LDL 84. I saw her for this issue in telehealth 03/17/19. Imdur was started in case there was a component of microvascular angina. Coronary CT 04/10/19 showed calcium score of 34 (42nd percentile), mild nonobstructive CAD (0-25% RCA, 25-39% prox-mid LAD), small hiatal hernia. Remote device interrogation 04/13/19 was normal.  She returns back for follow-up feeling well overall. She feels like the Imdur did make a positive impact on her breathing. She still has some residual DOE but not as limiting. She reports her edema seems to have gotten worse recently. It is still rather mild per her report but persistently puffy around her ankles. It used to go down at night but it doesn't as much anymore. No significant weight gain or orthopnea. She states she has had edema for several years although not previously documented as significant in cardiology examinations. The patient does not have symptoms concerning for COVID-19 infection (fever, chills, cough, or new shortness of breath).    Past Medical History:  Diagnosis Date   Allergy    Arthritis    CAD (coronary artery disease)    a. mild by prior cath; coronary CT 04/2019.   Cataract    Chronic edema    Complicated migraine    Diverticulosis    DM (diabetes mellitus) (HCC)  Essential hypertension 08/11/2014   Fibromyalgia    GERD (gastroesophageal reflux disease)    History of Serotonin syndrome w/ recurrent residual headaches 08/10/2014   Hyperlipidemia    Hypertension    Internal and external hemorrhoids without complication    Migraine with visual aura    Myocardial infarction ([place])    Potassium deficiency    Sinus  node dysfunction s/p PPM 04/09/2016   TIA (transient ischemic attack) 11/15/2015   Type 2 diabetes mellitus with diabetic polyneuropathy ([place]) 07/24/2015   Past Surgical History:  Procedure Laterality Date   ABDOMINAL HYSTERECTOMY     APPENDECTOMY     bil carple tunnel surgery     CARDIAC CATHETERIZATION N/A 04/09/2015   Procedure: Left Heart Cath and Coronary Angiography;  Surgeon: Peter M Martinique, MD;  Location: [place] [facility];  Service: Cardiovascular;  Laterality: N/A;   COLONOSCOPY  multiple   EP IMPLANTABLE DEVICE N/A 04/09/2016   Procedure: PPM Generator Changeout;  Surgeon: Evans Lance, MD;  Location: [place] [facility];  Service: Cardiovascular;  Laterality: N/A;   ESOPHAGEAL MANOMETRY N/A 08/02/2017   Procedure: ESOPHAGEAL MANOMETRY (EM);  Surgeon: Mauri Pole, MD;  Location: [facility];  Service: Endoscopy;  Laterality: N/A;   KNEE SURGERY     right x 2   NECK SURGERY     x2   PACEMAKER INSERTION     [place] IMPEDANCE STUDY N/A 08/02/2017   Procedure: [place] IMPEDANCE STUDY;  Surgeon: Mauri Pole, MD;  Location: [facility];  Service: Endoscopy;  Laterality: N/A;   UPPER GASTROINTESTINAL ENDOSCOPY       Current Meds  Medication Sig   amLODipine (NORVASC) 5 MG tablet Take 5 mg by mouth daily.   atorvastatin (LIPITOR) 80 MG tablet Take 1 tablet (80 mg total) by mouth every evening.   Biotin 1 MG CAPS Take 1 capsule by mouth daily.   CINNAMON PO Take 1 capsule by mouth daily.   clopidogrel (PLAVIX) 75 MG tablet Take 1 tablet (75 mg total) by mouth daily.   Coenzyme Q10 200 MG capsule Take 200 mg by mouth daily with lunch.   cyanocobalamin 500 MCG tablet Take 1,000 mcg by mouth daily with lunch. Vitamin B12   ferrous sulfate 325 (65 FE) MG tablet Take 1 tablet by mouth daily.   gabapentin (NEURONTIN) 300 MG capsule Take 900 mg by mouth in the morning and 900 mg by mouth in the afternoon and 1200 mg at night   glipiZIDE (GLUCOTROL XL) 5 MG 24  hr tablet Take 5 mg by mouth 2 (two) times a day.    glucose blood (ONE TOUCH ULTRA TEST) test strip AS DIRECTED TWICE DAILY   hydrochlorothiazide (HYDRODIURIL) 25 MG tablet Take 25 mg by mouth daily.   isosorbide mononitrate (IMDUR) 30 MG 24 hr tablet Take 0.5 tablets (15 mg total) by mouth daily.   losartan (COZAAR) 100 MG tablet Take 1 tablet (100 mg total) by mouth daily. Patient needs to call our office to schedule an appointment with Dr Martinique for future refills.   Multiple Vitamins-Minerals (ANTIOXIDANT PO) Take 1 tablet by mouth daily with lunch.   Omega-3 Fatty Acids (FISH OIL PO) Take 2 capsules by mouth daily with lunch.   OVER THE COUNTER MEDICATION Take 2 capsules by mouth daily. Vitamins for eyes   pantoprazole (PROTONIX) 40 MG tablet Take 1 tablet (40 mg total) by mouth 2 (two) times daily before a meal.   tiZANidine (ZANAFLEX) 2 MG tablet Take  2 mg by mouth as needed.    [DISCONTINUED] amLODipine (NORVASC) 5 MG tablet TK 1 T PO QD FOR BLOOD PRESSURE     Allergies:   Citalopram, Codeine, Cymbalta [duloxetine hcl], Metformin and related, Paroxetine hcl, Trazodone and nefazodone, and Caffeine   Social History   Tobacco Use   Smoking status: Never Smoker   Smokeless tobacco: Never Used  Substance Use Topics   Alcohol use: No    Alcohol/week: 0.0 standard drinks   Drug use: No     Family Hx: The patient's family history includes Breast cancer in her maternal aunt; Colon cancer in her brother, father, and sister; Colon polyps in her sister; Coronary artery disease in her father; Crohn's disease in her sister; Dementia in her daughter and mother; Diabetes in her cousin, daughter, father, maternal aunt, and maternal uncle; Heart attack in her brother, daughter, and father; Irritable bowel syndrome in her sister; Kidney disease in her daughter; Lung cancer in her father and mother; Prostate cancer in her father. There is no history of Hypertension, Esophageal cancer,  Rectal cancer, or Stomach cancer.  ROS:   Please see the history of present illness.    All other systems reviewed and are negative.   Prior CV studies:    Most recent pertinent cardiac studies are outlined above.  Labs/Other Tests and Data Reviewed:    EKG:  An ECG dated 04/22/18 was personally reviewed today and demonstrated:  atrial paced rhytmh with nonspecific STW changes - seen on prior tracing as well  Recent Labs: 03/13/2019: ALT 22; Brain Natriuretic Peptide 88; BUN 19; Creat 0.90; Hemoglobin 14.1; Platelets 273; Potassium 3.7; Sodium 139; TSH 3.04   Recent Lipid Panel Lab Results  Component Value Date/Time   CHOL 161 05/11/2016 06:38 AM   CHOL 158 11/15/2015 02:15 PM   TRIG 134 05/11/2016 06:38 AM   HDL 50 05/11/2016 06:38 AM   HDL 50 11/15/2015 02:15 PM   CHOLHDL 3.2 05/11/2016 06:38 AM   LDLCALC 84 05/11/2016 06:38 AM   LDLCALC 82 11/15/2015 02:15 PM    Wt Readings from Last 3 Encounters:  04/27/19 167 lb (75.8 kg)  03/17/19 164 lb (74.4 kg)  03/13/19 164 lb 12.8 oz (74.8 kg)     Objective:    Vital Signs:  BP 135/67    Pulse 62    Ht 5\' 6"  (1.676 m)    Wt 167 lb (75.8 kg)    SpO2 95%    BMI 26.95 kg/m    VS reviewed. General - pleasant female in no acute distress Pulm - No labored breathing, no coughing during visit, no audible wheezing, speaking in full sentences Neuro - A+Ox3, no slurred speech, answers questions appropriately Psych - Pleasant affect     ASSESSMENT & PLAN:    1. Dyspnea on exertion - improved with Imdur raising question of microvascular disease. Would continue for now. She likely has a component of deconditioning. Increased physical activity encouraged. See number 2 regarding echocardiogram for comprehensive eval. 2. Chronic lower extremity edema -  she reports this has been a chronic issue but she thinks getting worse recently. She was previously on lower dose of amlodipine and now 5mg  daily so I wonder to what extent that is  contributing. However, with her h/o dyspnea on exertion would like to recheck echocardiogram to evaluate pulmonary pressures and LV function to r/o any component of HF or pulmonary HTN. Changing amlodpine to higher dose of HCTZ or alternative such as carvedilol could be  considered. We discussed trial of Lasix in substitute for her HCTZ but she laughed, citing that was not a fun experience when she was on it due to the frequent urination so wants to hold off. I discussed limiting fluid to 48-64oz max per day as she is drinking more than that which is counter productive with her diuretic. I have asked nurse to request scheduling of echo on a day when I am in clinic so that I can visualize her legs to have a reference for her edema and we can make changes based on that information. 3. Sinus node dysfunction s/p PPM- this is followed by Dr. Ladona Ridgelaylor with recent satisfactory findings on interrogation. 4. Mild CAD - continue present regimen. Lipids are followed by primary care. Would advise a goal LDL of <70.  COVID-19 Education: The signs and symptoms of COVID-19 were discussed with the patient and how to seek care for testing (follow up with PCP or arrange E-visit).  The importance of social distancing was discussed today.  Time:   Today, I have spent 18 minutes with the patient with telehealth technology discussing the above problems.     Medication Adjustments/Labs and Tests Ordered: Current medicines are reviewed at length with the patient today.  Concerns regarding medicines are outlined above.   Disposition:  Follow up in 4 weeks with me in-person. Await echo results before advising on further med changes.  Signed, Laurann Montanaayna N Hilton Saephan, PA-C  04/27/2019 11:45 AM    [facility]

## 2019-04-26 ENCOUNTER — Institutional Professional Consult (permissible substitution): Payer: Medicare Other | Admitting: Internal Medicine

## 2019-04-27 ENCOUNTER — Telehealth (INDEPENDENT_AMBULATORY_CARE_PROVIDER_SITE_OTHER): Payer: Medicare Other | Admitting: Physician Assistant

## 2019-04-27 ENCOUNTER — Other Ambulatory Visit: Payer: Self-pay

## 2019-04-27 ENCOUNTER — Encounter: Payer: Self-pay | Admitting: Physician Assistant

## 2019-04-27 VITALS — BP 135/67 | HR 62 | Ht 66.0 in | Wt 167.0 lb

## 2019-04-27 DIAGNOSIS — R0609 Other forms of dyspnea: Secondary | ICD-10-CM | POA: Diagnosis not present

## 2019-04-27 DIAGNOSIS — I495 Sick sinus syndrome: Secondary | ICD-10-CM

## 2019-04-27 DIAGNOSIS — R609 Edema, unspecified: Secondary | ICD-10-CM

## 2019-04-27 DIAGNOSIS — I2510 Atherosclerotic heart disease of native coronary artery without angina pectoris: Secondary | ICD-10-CM

## 2019-04-27 DIAGNOSIS — R0600 Dyspnea, unspecified: Secondary | ICD-10-CM

## 2019-04-27 NOTE — Patient Instructions (Signed)
Medication Instructions:  Your physician recommends that you continue on your current medications as directed. Please refer to the Current Medication list given to you today.  If you need a refill on your cardiac medications before your next appointment, please call your pharmacy.   Lab work: None ordered  If you have labs (blood work) drawn today and your tests are completely normal, you will receive your results only by: Marland Kitchen MyChart Message (if you have MyChart) OR . A paper copy in the mail If you have any lab test that is abnormal or we need to change your treatment, we will call you to review the results.  Testing/Procedures: Your physician has requested that you have an echocardiogram. PLEASE ARRIVE ON 05/08/2019 AT 3:30 FOR THIS.  AT THIS TIME YOU WILL ALSO STOP TO SEE DAYNA DUNN SO SHE CAN TAKE A LOOK AT YOUR LEGS.   Echocardiography is a painless test that uses sound waves to create images of your heart. It provides your doctor with information about the size and shape of your heart and how well your heart's chambers and valves are working. This procedure takes approximately one hour. There are no restrictions for this procedure.    Follow-Up: At [facility], you and your health needs are our priority.  As part of our continuing mission to provide you with exceptional heart care, we have created designated Provider Care Teams.  These Care Teams include your primary Cardiologist (physician) and Advanced Practice Providers (APPs -  Physician Assistants and Nurse Practitioners) who all work together to provide you with the care you need, when you need it. ** YOU ARE SCHEDULED FOR A IN-OFFICE VISIT 05/24/2019 AT 3:00 WITH DAYNA DUNN, PA-C.  PLEASE ARRIVE 15 MINS EARLY TO THIS APPOINTMENT  IF EITHER OF THESE APPOINTMENTS DON'T WORK FOR Dennis Bast, CALL THE OFFICE [phone] AND ASK FOR Quincey Quesinberry W OR YOU CAN SEND A MESSAGE VIA MYCHART.   Any Other Special Instructions Will Be Listed Below (If  Applicable). Please limit fluid intake to 48-64 ounces per day to see if that helps with your swelling. Limiting your sodium intake can also help

## 2019-04-28 ENCOUNTER — Telehealth: Payer: Medicare Other | Admitting: Physician Assistant

## 2019-04-28 ENCOUNTER — Other Ambulatory Visit: Payer: Self-pay

## 2019-04-28 ENCOUNTER — Ambulatory Visit
Admission: RE | Admit: 2019-04-28 | Discharge: 2019-04-28 | Disposition: A | Discharge status: Home / Self Care | Payer: Medicare Other | Source: Ambulatory Visit | Attending: Family Medicine | Admitting: Family Medicine

## 2019-04-28 DIAGNOSIS — Z1231 Encounter for screening mammogram for malignant neoplasm of breast: Secondary | ICD-10-CM

## 2019-05-08 ENCOUNTER — Ambulatory Visit (HOSPITAL_COMMUNITY): Payer: Medicare Other | Attending: Internal Medicine

## 2019-05-08 ENCOUNTER — Other Ambulatory Visit: Payer: Self-pay

## 2019-05-08 DIAGNOSIS — R0609 Other forms of dyspnea: Secondary | ICD-10-CM

## 2019-05-08 DIAGNOSIS — R0600 Dyspnea, unspecified: Secondary | ICD-10-CM

## 2019-05-09 ENCOUNTER — Telehealth: Payer: Self-pay | Admitting: *Deleted

## 2019-05-09 MED ORDER — POTASSIUM CHLORIDE CRYS ER 20 MEQ PO TBCR: 20.0000 meq | EXTENDED_RELEASE_TABLET | Freq: Every day | ORAL | 3 refills | Status: DC

## 2019-05-09 MED ORDER — HYDROCHLOROTHIAZIDE 25 MG PO TABS: 50.0000 mg | ORAL_TABLET | Freq: Every day | ORAL | 3 refills | Status: DC

## 2019-05-09 MED ORDER — AMLODIPINE BESYLATE 2.5 MG PO TABS: 2.5000 mg | ORAL_TABLET | Freq: Every day | ORAL | 3 refills | Status: DC

## 2019-05-09 NOTE — Telephone Encounter (Signed)
-----   Message from Charlie Pitter, [place] sent at 05/09/2019 11:34 AM EDT ----- That would be fine with me. I would recommend she increase HCTZ to 50mg  daily, decrease amlodipine to 2.5mg  daily, and add potassium 76meq daily. Please increase dietary intake of healthy sources of potassium including bananas, squash, yogurt, white beans, sweet potatoes, leafy greens, and avocados. Dayna Dunn PA-C

## 2019-05-22 ENCOUNTER — Encounter: Payer: Self-pay | Admitting: Physician Assistant

## 2019-05-22 NOTE — Progress Notes (Signed)
Cardiology Office Note    Date:  05/24/2019  ID:  Timmothy EulerMary Frances [name], DOB July 20, 1941, MRN [id number] PCP:  [name], Kristen W., PA-C  Cardiologist:  [name], MD   Chief Complaint: f/u swelling  History of Present Illness:  Ashley DankerMary Frances [name] is a 78 y.o. female with history of HTN, sinus node dysfunction s/p MDT PPM (gen change 2017), migraine, DM, fibromyalgia, HLD, TIA, DM, hiatal hernia, minimal CAD by cath 2016, chronic edema who presents for f/u of shortness of breath and edema. She was referred back by primary care and also recently saw pulmonology as well.  She has prior history of evalulation for SOB and chest pain. Cardiac workup was previously unrevealing. Cath 04/2015 showed 10% prox RCA and 25% prox LAD with normal LVEF and normal LVEDP. She was switched from ACEI/ARB and referred to pulmonary as symptoms were felt to be noncardiac. She was recently seen by pulmonary for evaluation of SOB which seemed out of proportion to objective pulmonary findings. She did not have any desaturations with activity. She was started on PPI in case of silent GERD. Dr. Sherene SiresWert also suggested that anxiety, depression and deconditioning be high on the patient's differential as well. Labs 03/13/19 showed normal BNP, D-dimer, TSH, CBC, CMET. Last lipids in 2017 showed LDL 84. I saw her for this issue in telehealth 03/17/19. Imdur was started in case there was a component of microvascular angina. Coronary CT 04/10/19 showed calcium score of 34 (42nd percentile), mild nonobstructive CAD (0-25% RCA, 25-39% prox-mid LAD), small hiatal hernia. Device interrogation 04/13/19 was normal. She actually felt better on the Imdur but still reported ongoing leg swelling. Echo 05/08/19 showed EF 60-65%, normal diastolic function despite mild LA dilation, mildly increased LV wall thickness, normal RV, mild LAE/RAE. I offered to switch HCTZ to Lasix but she requested to increase HCTZ instead so we did so, and decreased  amlodipine.  She presents back for in-person follow-up today overall feeling well. Her edema has improved. She still indicates residual ankle edema but this is largely unimpressive and surrounded by varicose veins in the area, likely contributing. She has not had any exertional angina. She still feels generalized fatigue at times and some dyspnea on exertion. We discussed gradually increasing activity in a regular exercise program.  Past Medical History:  Diagnosis Date   Allergy    Arthritis    CAD (coronary artery disease)    a. mild by prior cath; coronary CT 04/2019.   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   Hyperlipidemia    Hypertension    Internal and external hemorrhoids without complication    Migraine with visual aura    Myocardial infarction (HCC)    Potassium deficiency    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/09/2016   TIA (transient ischemic attack) 11/15/2015   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    Past Surgical History:  Procedure Laterality Date   ABDOMINAL HYSTERECTOMY     APPENDECTOMY     bil carple tunnel surgery     CARDIAC CATHETERIZATION N/A 04/09/2015   Procedure: Left Heart Cath and Coronary Angiography;  Surgeon: Peter M SwazilandJordan, MD;  [facility];  Service: Cardiovascular;  Laterality: N/A;  COLONOSCOPY  multiple   EP IMPLANTABLE DEVICE N/A 04/09/2016   Procedure: PPM Generator Changeout;  Surgeon: Marinus MawGregg W Taylor, MD;  [facility];  Service: Cardiovascular;  Laterality: N/A;   ESOPHAGEAL MANOMETRY N/A 08/02/2017   Procedure: ESOPHAGEAL MANOMETRY (EM);  Surgeon: Napoleon FormNandigam, Kavitha V, MD;  Location: [facility] ENDOSCOPY;  Service: Endoscopy;   Laterality: N/A;   KNEE SURGERY     right x 2   NECK SURGERY     x2   PACEMAKER INSERTION     PH IMPEDANCE STUDY N/A 08/02/2017   Procedure: PH IMPEDANCE STUDY;  Surgeon: Napoleon FormNandigam, Kavitha V, MD;  Location: [facility] ENDOSCOPY;  Service: Endoscopy;  Laterality: N/A;   UPPER GASTROINTESTINAL ENDOSCOPY      Current Medications: Current Meds  Medication Sig   amLODipine (NORVASC) 2.5 MG tablet Take 1 tablet (2.5 mg total) by mouth daily.   atorvastatin (LIPITOR) 80 MG tablet Take 1 tablet (80 mg total) by mouth every evening.   Biotin 1 MG CAPS Take 1 capsule by mouth daily.   CINNAMON PO Take 1 capsule by mouth daily.   clopidogrel (PLAVIX) 75 MG tablet Take 1 tablet (75 mg total) by mouth daily.   Coenzyme Q10 200 MG capsule Take 200 mg by mouth daily with lunch.   cyanocobalamin 500 MCG tablet Take 1,000 mcg by mouth daily with lunch. Vitamin B12   ferrous sulfate 325 (65 FE) MG tablet Take 1 tablet by mouth daily.   gabapentin (NEURONTIN) 300 MG capsule Take 900 mg by mouth in the morning and 900 mg by mouth in the afternoon and 1200 mg at night   glipiZIDE (GLUCOTROL XL) 5 MG 24 hr tablet Take 5 mg by mouth 2 (two) times a day.    glucose blood (ONE TOUCH ULTRA TEST) test strip AS DIRECTED TWICE DAILY   hydrochlorothiazide (HYDRODIURIL) 25 MG tablet Take 2 tablets (50 mg total) by mouth daily.   isosorbide mononitrate (IMDUR) 30 MG 24 hr tablet Take 0.5 tablets (15 mg total) by mouth daily.   losartan (COZAAR) 100 MG tablet Take 1 tablet (100 mg total) by mouth daily. Patient needs to call our office to schedule an appointment with Dr SwazilandJordan for future refills.   Multiple Vitamins-Minerals (ANTIOXIDANT PO) Take 1 tablet by mouth daily with lunch.   Omega-3 Fatty Acids (FISH OIL PO) Take 2 capsules by mouth daily with lunch.   OVER THE COUNTER MEDICATION Take 2 capsules by mouth daily. Vitamins for eyes   pantoprazole (PROTONIX) 40 MG tablet Take 1 tablet (40 mg total)  by mouth 2 (two) times daily before a meal.   potassium chloride SA (K-DUR) 20 MEQ tablet Take 1 tablet (20 mEq total) by mouth daily.     Allergies:   Citalopram, Codeine, Cymbalta [duloxetine hcl], Metformin and related, Paroxetine hcl, Trazodone and nefazodone, and Caffeine   Social History   Socioeconomic History   Marital status: Married    Spouse name: james   Number of children: 2   Years of education: 12   Highest education level: Not on file  Occupational History   Occupation: Retired    Associate Professormployer: RETIRED  Ecologistocial Needs   Financial resource strain: Not on file   Food insecurity    Worry: Not on file    Inability: Not on file   Transportation needs    Medical: Not on file    Non-medical: Not on file  Tobacco Use   Smoking status: Never Smoker   Smokeless  tobacco: Never Used  Substance and Sexual Activity   Alcohol use: No    Alcohol/week: 0.0 standard drinks   Drug use: No   Sexual activity: Not Currently  Lifestyle   Physical activity    Days per week: Not on file    Minutes per session: Not on file   Stress: Not on file  Relationships   Social connections    Talks on phone: Not on file    Gets together: Not on file    Attends religious service: Not on file    Active member of club or organization: Not on file    Attends meetings of clubs or organizations: Not on file    Relationship status: Not on file  Other Topics Concern   Not on file  Social History Narrative   Patient is married with 2 children. She lives at home with her husband.    Patient is right handed.   Patient has 12 th grade education.   Patient drinks occasional caffeine.     Family History:  The patient's family history includes Breast cancer in her maternal aunt; Colon cancer in her brother, father, and sister; Colon polyps in her sister; Coronary artery disease in her father; Crohn's disease in her sister; Dementia in her daughter and mother; Diabetes in her cousin,  daughter, father, maternal aunt, and maternal uncle; Heart attack in her brother, daughter, and father; Irritable bowel syndrome in her sister; Kidney disease in her daughter; Lung cancer in her father and mother; Prostate cancer in her father. There is no history of Hypertension, Esophageal cancer, Rectal cancer, or Stomach cancer.  ROS:   Please see the history of present illness All other systems are reviewed and otherwise negative.    PHYSICAL EXAM:   VS:  BP 112/60    Pulse 90    Ht 5\' 6"  (1.676 m)    Wt 168 lb (76.2 kg)    SpO2 99%    BMI 27.12 kg/m   BMI: Body mass index is 27.12 kg/m. GEN: Well nourished, well developed WF, in no acute distress HEENT: normocephalic, atraumatic Neck: no JVD, carotid bruits, or masses Cardiac: RRR; no murmurs, rubs, or gallops, trace ankle edema on the R ankle with clusters of varicosities in the area (bilateral varicose veins noted) Respiratory:  clear to auscultation bilaterally, normal work of breathing GI: soft, nontender, nondistended, + BS MS: no deformity or atrophy Skin: warm and dry, no rash Neuro:  Alert and Oriented x 3, Strength and sensation are intact, follows commands Psych: euthymic mood, full affect  Wt Readings from Last 3 Encounters:  05/24/19 168 lb (76.2 kg)  04/27/19 167 lb (75.8 kg)  03/17/19 164 lb (74.4 kg)      Studies/Labs Reviewed:   EKG:  EKG was not ordered today  Recent Labs: 03/13/2019: ALT 22; Brain Natriuretic Peptide 88; BUN 19; Creat 0.90; Hemoglobin 14.1; Platelets 273; Potassium 3.7; Sodium 139; TSH 3.04   Lipid Panel    Component Value Date/Time   CHOL 161 05/11/2016 0638   CHOL 158 11/15/2015 1415   TRIG 134 05/11/2016 0638   HDL 50 05/11/2016 0638   HDL 50 11/15/2015 1415   CHOLHDL 3.2 05/11/2016 0638   VLDL 27 05/11/2016 0638   LDLCALC 84 05/11/2016 0638   LDLCALC 82 11/15/2015 1415    Additional studies/ records that were reviewed today include: Summarized above   ASSESSMENT & PLAN:    1. Lower extremity edema - likely related to varicose veins.  BNP normal, RV normal on echo, and appears controlled on HCTZ. She will continue to use compression hose as well. Check BMET today given diuretic increase. 2. Exertional dyspnea - does not appear related to cardiac pathology at this point. Have recommended she gradually increase physical activity to build endurance, and also encouraged her to see primary care to follow generalized fatigue. OSA eval may be warranted if fatigue persists despite habitual exercise and good sleep hygiene. She sees PCP in August and will discuss her progress with them. 3. Minimal CAD - stable by recent cardiac CT. Continue secondary prevention. Cholesterol is followed by primary care. Interestingly, Imdur did help with her DOE somewhat. Will continue for now. BP now looks great. 4. Essential HTN - BP well controlled on current regimen.  Disposition: F/u with Dr. Martinique in 6 months; EP as scheduled.  Medication Adjustments/Labs and Tests Ordered: Current medicines are reviewed at length with the patient today.  Concerns regarding medicines are outlined above. Medication changes, Labs and Tests ordered today are summarized above and listed in the Patient Instructions accessible in Encounters.   Signed, Charlie Pitter, PA-C  05/24/2019 3:41 PM    [facility] [address], [place], [place]  [address] Phone: [phone]; Fax: [phone]

## 2019-05-24 ENCOUNTER — Encounter: Payer: Self-pay | Admitting: Physician Assistant

## 2019-05-24 ENCOUNTER — Ambulatory Visit (INDEPENDENT_AMBULATORY_CARE_PROVIDER_SITE_OTHER): Payer: Medicare Other | Admitting: Physician Assistant

## 2019-05-24 ENCOUNTER — Other Ambulatory Visit: Payer: Self-pay

## 2019-05-24 VITALS — BP 112/60 | HR 90 | Ht 66.0 in | Wt 168.0 lb

## 2019-05-24 DIAGNOSIS — R0600 Dyspnea, unspecified: Secondary | ICD-10-CM

## 2019-05-24 DIAGNOSIS — I10 Essential (primary) hypertension: Secondary | ICD-10-CM | POA: Diagnosis not present

## 2019-05-24 DIAGNOSIS — R0609 Other forms of dyspnea: Secondary | ICD-10-CM

## 2019-05-24 DIAGNOSIS — R600 Localized edema: Secondary | ICD-10-CM | POA: Diagnosis not present

## 2019-05-24 DIAGNOSIS — I2510 Atherosclerotic heart disease of native coronary artery without angina pectoris: Secondary | ICD-10-CM | POA: Diagnosis not present

## 2019-05-24 NOTE — Patient Instructions (Signed)
Medication Instructions:  Your physician recommends that you continue on your current medications as directed. Please refer to the Current Medication list given to you today.   If you need a refill on your cardiac medications before your next appointment, please call your pharmacy.   Lab work: TODAY:  BMET  If you have labs (blood work) drawn today and your tests are completely normal, you will receive your results only by: Marland Kitchen MyChart Message (if you have MyChart) OR . A paper copy in the mail If you have any lab test that is abnormal or we need to change your treatment, we will call you to review the results.  Testing/Procedures: None ordered  Follow-Up: At [facility], you and your health needs are our priority.  As part of our continuing mission to provide you with exceptional heart care, we have created designated Provider Care Teams.  These Care Teams include your primary Cardiologist (physician) and Advanced Practice Providers (APPs -  Physician Assistants and Nurse Practitioners) who all work together to provide you with the care you need, when you need it. You will need a follow up appointment in 6 months.  Please call our office 2 months in advance to schedule this appointment.  You may see Peter Martinique, MD or one of the following Advanced Practice Providers on your designated Care Team: [place], [place] . Fabian Sharp, PA-C  Any Other Special Instructions Will Be Listed Below (If Applicable). I have checked with the device clinic and you are ok to have whatever smart watch you would like.  There is no concerns with the pacemaker.

## 2019-05-25 LAB — BASIC METABOLIC PANEL
BUN/Creatinine Ratio: 24 (ref 12–28)
BUN: 21 mg/dL (ref 8–27)
CO2: 23 mmol/L (ref 20–29)
Calcium: 9.7 mg/dL (ref 8.7–10.3)
Chloride: 96 mmol/L (ref 96–106)
Creatinine, Ser: 0.89 mg/dL (ref 0.57–1.00)
GFR calc Af Amer: 73 mL/min/{1.73_m2} (ref >59)
GFR calc non Af Amer: 63 mL/min/{1.73_m2} (ref >59)
Glucose: 126 mg/dL — ABNORMAL HIGH (ref 65–99)
Potassium: 3.8 mmol/L (ref 3.5–5.2)
Sodium: 137 mmol/L (ref 134–144)

## 2019-05-25 NOTE — Progress Notes (Signed)
Pt has been made aware of normal result and verbalized understanding.  jw 05/25/2019

## 2019-07-03 ENCOUNTER — Encounter: Payer: Self-pay | Admitting: Internal Medicine

## 2019-07-03 ENCOUNTER — Other Ambulatory Visit: Payer: Self-pay

## 2019-07-03 ENCOUNTER — Ambulatory Visit (INDEPENDENT_AMBULATORY_CARE_PROVIDER_SITE_OTHER): Payer: Medicare Other | Admitting: Internal Medicine

## 2019-07-03 VITALS — BP 98/62 | HR 78 | Ht 66.0 in | Wt 167.0 lb

## 2019-07-03 DIAGNOSIS — I10 Essential (primary) hypertension: Secondary | ICD-10-CM | POA: Diagnosis not present

## 2019-07-03 DIAGNOSIS — I495 Sick sinus syndrome: Secondary | ICD-10-CM

## 2019-07-03 DIAGNOSIS — I2510 Atherosclerotic heart disease of native coronary artery without angina pectoris: Secondary | ICD-10-CM | POA: Diagnosis not present

## 2019-07-03 MED ORDER — LOSARTAN POTASSIUM 50 MG PO TABS: 50.0000 mg | ORAL_TABLET | Freq: Every day | ORAL | 3 refills | Status: DC

## 2019-07-03 NOTE — Patient Instructions (Addendum)
Medication Instructions:  Your physician has recommended you make the following change in your medication:   1.  Reduce your losartan to 50 mg daily---  Labwork: None ordered.  Testing/Procedures: None ordered.  Follow-Up: Your physician wants you to follow-up in: one year with Dr. Lovena Le.   You will receive a reminder letter in the mail two months in advance. If you don't receive a letter, please call our office to schedule the follow-up appointment.  Remote monitoring is used to monitor your Pacemaker from home. This monitoring reduces the number of office visits required to check your device to one time per year. It allows [place] to keep an eye on the functioning of your device to ensure it is working properly. You are scheduled for a device check from home on 07/13/2019. You may send your transmission at any time that day. If you have a wireless device, the transmission will be sent automatically. After your physician reviews your transmission, you will receive a postcard with your next transmission date.  Any Other Special Instructions Will Be Listed Below (If Applicable).  If you need a refill on your cardiac medications before your next appointment, please call your pharmacy.

## 2019-07-03 NOTE — Progress Notes (Signed)
HPI The patient returns today for followup of HTN, sob, and PPM due to sinus node dysfunction. She has been bothered by low pressure and sob. She denies sodium indiscretion. No chest pain. Her dyspnea has improved. No edema.  Allergies  Allergen Reactions  . Citalopram     Other reaction(s): Serum Sickness (ALLERGY) Serotonin Syndrome  . Codeine Other (See Comments)    hallucinations  . Cymbalta [Duloxetine Hcl] Other (See Comments)    Serotonin Syndrome  . Metformin And Related     Causes dehydration and causes me to go to the hospital  . Paroxetine Hcl     Serotonin Syndrome  . Trazodone And Nefazodone Other (See Comments)    Serotonin Syndrome  . Caffeine Anxiety    Nervousness      Current Outpatient Medications  Medication Sig Dispense Refill  . amLODipine (NORVASC) 2.5 MG tablet Take 1 tablet (2.5 mg total) by mouth daily. 180 tablet 3  . atorvastatin (LIPITOR) 80 MG tablet Take 1 tablet (80 mg total) by mouth every evening. 30 tablet 1  . Biotin 1 MG CAPS Take 1 capsule by mouth daily.    Marland Kitchen. CINNAMON PO Take 1 capsule by mouth daily.    . clopidogrel (PLAVIX) 75 MG tablet Take 1 tablet (75 mg total) by mouth daily. 30 tablet 11  . Coenzyme Q10 200 MG capsule Take 200 mg by mouth daily with lunch.    . cyanocobalamin 500 MCG tablet Take 1,000 mcg by mouth daily with lunch. Vitamin B12    . ferrous sulfate 325 (65 FE) MG tablet Take 1 tablet by mouth daily.    Marland Kitchen. gabapentin (NEURONTIN) 300 MG capsule Take 900 mg by mouth in the morning and 900 mg by mouth in the afternoon and 900 mg at night    . glipiZIDE (GLUCOTROL XL) 5 MG 24 hr tablet Take 5 mg by mouth 2 (two) times a day.     Marland Kitchen. glucose blood (ONE TOUCH ULTRA TEST) test strip AS DIRECTED TWICE DAILY    . hydrochlorothiazide (HYDRODIURIL) 25 MG tablet Take 2 tablets (50 mg total) by mouth daily. (Patient taking differently: Take 25 mg by mouth daily. ) 60 tablet 3  . losartan (COZAAR) 100 MG tablet Take 1 tablet  (100 mg total) by mouth daily. Patient needs to call our office to schedule an appointment with Dr SwazilandJordan for future refills. 90 tablet 0  . Multiple Vitamins-Minerals (ANTIOXIDANT PO) Take 1 tablet by mouth daily with lunch.    . Omega-3 Fatty Acids (FISH OIL PO) Take 2 capsules by mouth daily with lunch.    Marland Kitchen. OVER THE COUNTER MEDICATION Take 2 capsules by mouth daily. Vitamins for eyes    . pantoprazole (PROTONIX) 40 MG tablet Take 1 tablet (40 mg total) by mouth 2 (two) times daily before a meal. 60 tablet 2  . potassium chloride SA (K-DUR) 20 MEQ tablet Take 1 tablet (20 mEq total) by mouth daily. 90 tablet 3  . isosorbide mononitrate (IMDUR) 30 MG 24 hr tablet Take 0.5 tablets (15 mg total) by mouth daily. 45 tablet 1   No current facility-administered medications for this visit.      Past Medical History:  Diagnosis Date  . Allergy   . Arthritis   . CAD (coronary artery disease)    a. mild by prior cath; coronary CT 04/2019.  Marland Kitchen. Cataract   . Chronic edema   . Complicated migraine   . Diverticulosis   .  DM (diabetes mellitus) ([place])   . Essential hypertension 08/11/2014  . Fibromyalgia   . GERD (gastroesophageal reflux disease)   . History of Serotonin syndrome w/ recurrent residual headaches 08/10/2014  . Hyperlipidemia   . Hypertension   . Internal and external hemorrhoids without complication   . Migraine with visual aura   . Myocardial infarction ([place])   . Potassium deficiency   . 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/09/2016  . TIA (transient ischemic attack) 11/15/2015  . Type 2 diabetes mellitus with diabetic polyneuropathy ([place]) 07/24/2015    ROS:   All systems reviewed and negative except as noted in the HPI.   Past Surgical History:  Procedure Laterality Date  . ABDOMINAL HYSTERECTOMY    . APPENDECTOMY    . bil carple tunnel surgery    . CARDIAC CATHETERIZATION N/A 04/09/2015   Procedure: Left Heart Cath and Coronary Angiography;  Surgeon: Peter M Martinique, MD;   Location: [place] CV LAB;  Service: Cardiovascular;  Laterality: N/A;  . COLONOSCOPY  multiple  . EP IMPLANTABLE DEVICE N/A 04/09/2016   Procedure: PPM Generator Changeout;  Surgeon: Evans Lance, MD;  Location: [place] CV LAB;  Service: Cardiovascular;  Laterality: N/A;  . ESOPHAGEAL MANOMETRY N/A 08/02/2017   Procedure: ESOPHAGEAL MANOMETRY (EM);  Surgeon: Mauri Pole, MD;  Location: [facility] ENDOSCOPY;  Service: Endoscopy;  Laterality: N/A;  . KNEE SURGERY     right x 2  . NECK SURGERY     x2  . PACEMAKER INSERTION    . [place] IMPEDANCE STUDY N/A 08/02/2017   Procedure: [place] IMPEDANCE STUDY;  Surgeon: Mauri Pole, MD;  Location: [facility] ENDOS[facility];  Service: Endoscopy;  Laterality: N/A;  . UPPER GASTROINTESTINAL ENDOSCOPY       Family History  Problem Relation Age of Onset  . Coronary artery disease Father   . Prostate cancer Father   . Colon cancer Father   . Lung cancer Father   . Diabetes Father   . Heart attack Father   . Diabetes Daughter   . Dementia Daughter   . Colon cancer Sister        x 2  . Lung cancer Mother   . Dementia Mother   . Colon polyps Sister   . Crohn's disease Sister   . Diabetes Maternal Aunt        multiple  . Breast cancer Maternal Aunt   . Colon cancer Brother   . Kidney disease Daughter   . Diabetes Cousin        multiple  . Diabetes Maternal Uncle        multiple  . Irritable bowel syndrome Sister   . Heart attack Daughter   . Heart attack Brother   . Hypertension Neg Hx   . Esophageal cancer Neg Hx   . Rectal cancer Neg Hx   . Stomach cancer Neg Hx      Social History   Socioeconomic History  . Marital status: Married    Spouse name: Jeneen Rinks  . Number of children: 2  . Years of education: 62  . Highest education level: Not on file  Occupational History  . Occupation: Retired    Fish farm manager: RETIRED  Social Needs  . Financial resource strain: Not on file  . Food insecurity    Worry: Not on file    Inability: Not on  file  . Transportation needs    Medical: Not on file    Non-medical: Not on file  Tobacco Use  .  Smoking status: Never Smoker  . Smokeless tobacco: Never Used  Substance and Sexual Activity  . Alcohol use: No    Alcohol/week: 0.0 standard drinks  . Drug use: No  . Sexual activity: Not Currently  Lifestyle  . Physical activity    Days per week: Not on file    Minutes per session: Not on file  . Stress: Not on file  Relationships  . Social Musician on phone: Not on file    Gets together: Not on file    Attends religious service: Not on file    Active member of club or organization: Not on file    Attends meetings of clubs or organizations: Not on file    Relationship status: Not on file  . Intimate partner violence    Fear of current or ex partner: Not on file    Emotionally abused: Not on file    Physically abused: Not on file    Forced sexual activity: Not on file  Other Topics Concern  . Not on file  Social History Narrative   Patient is married with 2 children. She lives at home with her husband.    Patient is right handed.   Patient has 12 th grade education.   Patient drinks occasional caffeine.     BP 98/62   Pulse 78   Ht 5\' 6"  (1.676 m)   Wt 167 lb (75.8 kg)   SpO2 97%   BMI 26.95 kg/m   Physical Exam:  Well appearing 78 yo woman, NAD HEENT: Unremarkable Neck:  6 cm JVD, no thyromegally Lymphatics:  No adenopathy Back:  No CVA tenderness Lungs:  Clear with no wheezes HEART:  Regular rate rhythm, no murmurs, no rubs, no clicks Abd:  soft, positive bowel sounds, no organomegally, no rebound, no guarding Ext:  2 plus pulses, no edema, no cyanosis, no clubbing Skin:  No rashes no nodules Neuro:  CN II through XII intact, motor grossly intact  EKG - NSR with atrial pacing  DEVICE  Normal device function.  See PaceArt for details.   Assess/Plan: 1. Sinus node dysfunction - she is asymptomatic, s/p PPM insertion.  2. HTN - her blood  pressure has been running low. I have asked her to reduce her dose of losartan from 100 to 50 mg daily.  3. PPM - her medtronic DDD PM is working normally.  4. Dyspnea - I suspect that this is multifactorial. She will maintain a low sodium diet. She may need lasix but continue HCTZ for now.  [name].D.

## 2019-07-07 ENCOUNTER — Other Ambulatory Visit: Payer: Self-pay

## 2019-07-13 ENCOUNTER — Ambulatory Visit (INDEPENDENT_AMBULATORY_CARE_PROVIDER_SITE_OTHER): Payer: Medicare Other | Admitting: *Deleted

## 2019-07-13 DIAGNOSIS — I495 Sick sinus syndrome: Secondary | ICD-10-CM | POA: Diagnosis not present

## 2019-07-13 LAB — CUP PACEART REMOTE DEVICE CHECK
Battery Remaining Longevity: 81 mo
Battery Voltage: 3.01 V
Brady Statistic AP VP Percent: 0.12 %
Brady Statistic AP VS Percent: 98.79 %
Brady Statistic AS VP Percent: 0 %
Brady Statistic AS VS Percent: 1.09 %
Brady Statistic RA Percent Paced: 98.86 %
Brady Statistic RV Percent Paced: 0.13 %
Date Time Interrogation Session: 20200910123630
Implantable Lead Implant Date: 20070910
Implantable Lead Implant Date: 20070910
Implantable Lead Location: 753859
Implantable Lead Location: 753860
Implantable Lead Model: 5076
Implantable Lead Model: 5076
Implantable Pulse Generator Implant Date: 20170608
Lead Channel Impedance Value: 304 Ohm
Lead Channel Impedance Value: 399 Ohm
Lead Channel Impedance Value: 418 Ohm
Lead Channel Impedance Value: 437 Ohm
Lead Channel Pacing Threshold Amplitude: 0.75 V
Lead Channel Pacing Threshold Amplitude: 0.75 V
Lead Channel Pacing Threshold Pulse Width: 0.4 ms
Lead Channel Pacing Threshold Pulse Width: 0.4 ms
Lead Channel Sensing Intrinsic Amplitude: 1.375 mV
Lead Channel Sensing Intrinsic Amplitude: 13.25 mV
Lead Channel Setting Pacing Amplitude: 2 V
Lead Channel Setting Pacing Amplitude: 2.5 V
Lead Channel Setting Pacing Pulse Width: 0.4 ms
Lead Channel Setting Sensing Sensitivity: 2.8 mV

## 2019-07-24 LAB — CUP PACEART INCLINIC DEVICE CHECK
Battery Remaining Longevity: 81 mo
Battery Voltage: 3.01 V
Brady Statistic AP VP Percent: 0.09 %
Brady Statistic AP VS Percent: 99.15 %
Brady Statistic AS VP Percent: 0 %
Brady Statistic AS VS Percent: 0.76 %
Brady Statistic RA Percent Paced: 99.21 %
Brady Statistic RV Percent Paced: 0.1 %
Date Time Interrogation Session: 20200831185319
Implantable Lead Implant Date: 20070910
Implantable Lead Implant Date: 20070910
Implantable Lead Location: 753859
Implantable Lead Location: 753860
Implantable Lead Model: 5076
Implantable Lead Model: 5076
Implantable Pulse Generator Implant Date: 20170608
Lead Channel Impedance Value: 361 Ohm
Lead Channel Impedance Value: 456 Ohm
Lead Channel Impedance Value: 494 Ohm
Lead Channel Impedance Value: 532 Ohm
Lead Channel Pacing Threshold Amplitude: 0.75 V
Lead Channel Pacing Threshold Amplitude: 0.75 V
Lead Channel Pacing Threshold Pulse Width: 0.4 ms
Lead Channel Pacing Threshold Pulse Width: 0.4 ms
Lead Channel Sensing Intrinsic Amplitude: 13.625 mV
Lead Channel Sensing Intrinsic Amplitude: 2.1 mV
Lead Channel Setting Pacing Amplitude: 2 V
Lead Channel Setting Pacing Amplitude: 2.5 V
Lead Channel Setting Pacing Pulse Width: 0.4 ms
Lead Channel Setting Sensing Sensitivity: 2.8 mV

## 2019-07-24 NOTE — Progress Notes (Signed)
Remote pacemaker transmission.   

## 2019-10-12 ENCOUNTER — Ambulatory Visit (INDEPENDENT_AMBULATORY_CARE_PROVIDER_SITE_OTHER): Payer: Medicare Other | Admitting: *Deleted

## 2019-10-12 DIAGNOSIS — Z950 Presence of cardiac pacemaker: Secondary | ICD-10-CM | POA: Diagnosis not present

## 2019-10-13 LAB — CUP PACEART REMOTE DEVICE CHECK
Battery Remaining Longevity: 78 mo
Battery Voltage: 3 V
Brady Statistic AP VP Percent: 0.08 %
Brady Statistic AP VS Percent: 99.03 %
Brady Statistic AS VP Percent: 0.03 %
Brady Statistic AS VS Percent: 0.85 %
Brady Statistic RA Percent Paced: 99.04 %
Brady Statistic RV Percent Paced: 0.12 %
Date Time Interrogation Session: 20201211115200
Implantable Lead Implant Date: 20070910
Implantable Lead Implant Date: 20070910
Implantable Lead Location: 753859
Implantable Lead Location: 753860
Implantable Lead Model: 5076
Implantable Lead Model: 5076
Implantable Pulse Generator Implant Date: 20170608
Lead Channel Impedance Value: 304 Ohm
Lead Channel Impedance Value: 399 Ohm
Lead Channel Impedance Value: 418 Ohm
Lead Channel Impedance Value: 456 Ohm
Lead Channel Pacing Threshold Amplitude: 0.875 V
Lead Channel Pacing Threshold Amplitude: 1 V
Lead Channel Pacing Threshold Pulse Width: 0.4 ms
Lead Channel Pacing Threshold Pulse Width: 0.4 ms
Lead Channel Sensing Intrinsic Amplitude: 1.25 mV
Lead Channel Sensing Intrinsic Amplitude: 1.25 mV
Lead Channel Sensing Intrinsic Amplitude: 13.375 mV
Lead Channel Sensing Intrinsic Amplitude: 13.375 mV
Lead Channel Setting Pacing Amplitude: 2 V
Lead Channel Setting Pacing Amplitude: 2.5 V
Lead Channel Setting Pacing Pulse Width: 0.4 ms
Lead Channel Setting Sensing Sensitivity: 2.8 mV

## 2019-12-12 ENCOUNTER — Ambulatory Visit: Payer: Medicare Other | Attending: Internal Medicine

## 2019-12-12 DIAGNOSIS — Z23 Encounter for immunization: Secondary | ICD-10-CM | POA: Insufficient documentation

## 2019-12-12 NOTE — Progress Notes (Signed)
   Covid-19 Vaccination Clinic  Name:  [name]    MRN: [id number] DOB: 1941/10/23  12/12/2019  [name] was observed post Covid-19 immunization for 15 minutes without incidence. She was provided with Vaccine Information Sheet and instruction to access the V-Safe system.   [name] was instructed to call 911 with any severe reactions post vaccine: Marland Kitchen Difficulty breathing  . Swelling of your face and throat  . A fast heartbeat  . A bad rash all over your body  . Dizziness and weakness    Immunizations Administered    Name Date Dose VIS Date Route   Pfizer COVID-19 Vaccine 12/12/2019  1:27 PM 0.3 mL 10/13/2019 Intramuscular   Manufacturer: [employer], [employer]   Lot: [id number]   NDC: [id number]

## 2019-12-14 ENCOUNTER — Ambulatory Visit: Payer: Medicare Other

## 2020-01-06 ENCOUNTER — Ambulatory Visit: Payer: Medicare Other | Attending: Internal Medicine

## 2020-01-06 DIAGNOSIS — Z23 Encounter for immunization: Secondary | ICD-10-CM

## 2020-01-06 NOTE — Progress Notes (Signed)
   Covid-19 Vaccination Clinic  Name:  [name]    MRN: [id number] DOB: 1941-07-31  01/06/2020  [name] was observed post Covid-19 immunization for 15 minutes without incident. She was provided with Vaccine Information Sheet and instruction to access the V-Safe system.   [name] was instructed to call 911 with any severe reactions post vaccine: Marland Kitchen Difficulty breathing  . Swelling of face and throat  . A fast heartbeat  . A bad rash all over body  . Dizziness and weakness   Immunizations Administered    Name Date Dose VIS Date Route   Pfizer COVID-19 Vaccine 01/06/2020  8:37 AM 0.3 mL 10/13/2019 Intramuscular   Manufacturer: [employer], [employer]   Lot: [id number]   NDC: [address]-8

## 2020-01-11 ENCOUNTER — Ambulatory Visit (INDEPENDENT_AMBULATORY_CARE_PROVIDER_SITE_OTHER): Payer: Medicare Other | Admitting: *Deleted

## 2020-01-11 DIAGNOSIS — Z950 Presence of cardiac pacemaker: Secondary | ICD-10-CM | POA: Diagnosis not present

## 2020-01-16 LAB — CUP PACEART REMOTE DEVICE CHECK
Battery Remaining Longevity: 70 mo
Battery Voltage: 3 V
Brady Statistic AP VP Percent: 0.08 %
Brady Statistic AP VS Percent: 98.17 %
Brady Statistic AS VP Percent: 0.03 %
Brady Statistic AS VS Percent: 1.72 %
Brady Statistic RA Percent Paced: 98.15 %
Brady Statistic RV Percent Paced: 0.11 %
Date Time Interrogation Session: 20210316094542
Implantable Lead Implant Date: 20070910
Implantable Lead Implant Date: 20070910
Implantable Lead Location: 753859
Implantable Lead Location: 753860
Implantable Lead Model: 5076
Implantable Lead Model: 5076
Implantable Pulse Generator Implant Date: 20170608
Lead Channel Impedance Value: 304 Ohm
Lead Channel Impedance Value: 399 Ohm
Lead Channel Impedance Value: 418 Ohm
Lead Channel Impedance Value: 456 Ohm
Lead Channel Pacing Threshold Amplitude: 0.75 V
Lead Channel Pacing Threshold Amplitude: 0.875 V
Lead Channel Pacing Threshold Pulse Width: 0.4 ms
Lead Channel Pacing Threshold Pulse Width: 0.4 ms
Lead Channel Sensing Intrinsic Amplitude: 1.625 mV
Lead Channel Sensing Intrinsic Amplitude: 1.625 mV
Lead Channel Sensing Intrinsic Amplitude: 14.75 mV
Lead Channel Sensing Intrinsic Amplitude: 14.75 mV
Lead Channel Setting Pacing Amplitude: 2 V
Lead Channel Setting Pacing Amplitude: 2.5 V
Lead Channel Setting Pacing Pulse Width: 0.4 ms
Lead Channel Setting Sensing Sensitivity: 2.8 mV

## 2020-01-16 NOTE — Progress Notes (Signed)
PPM Remote  

## 2020-04-11 ENCOUNTER — Ambulatory Visit (INDEPENDENT_AMBULATORY_CARE_PROVIDER_SITE_OTHER): Payer: Medicare Other | Admitting: *Deleted

## 2020-04-11 DIAGNOSIS — G459 Transient cerebral ischemic attack, unspecified: Secondary | ICD-10-CM

## 2020-04-12 LAB — CUP PACEART REMOTE DEVICE CHECK
Battery Remaining Longevity: 70 mo
Battery Voltage: 3 V
Brady Statistic AP VP Percent: 0.1 %
Brady Statistic AP VS Percent: 98.37 %
Brady Statistic AS VP Percent: 0 %
Brady Statistic AS VS Percent: 1.53 %
Brady Statistic RA Percent Paced: 98.37 %
Brady Statistic RV Percent Paced: 0.1 %
Date Time Interrogation Session: 20210610204416
Implantable Lead Implant Date: 20070910
Implantable Lead Implant Date: 20070910
Implantable Lead Location: 753859
Implantable Lead Location: 753860
Implantable Lead Model: 5076
Implantable Lead Model: 5076
Implantable Pulse Generator Implant Date: 20170608
Lead Channel Impedance Value: 304 Ohm
Lead Channel Impedance Value: 418 Ohm
Lead Channel Impedance Value: 456 Ohm
Lead Channel Impedance Value: 494 Ohm
Lead Channel Pacing Threshold Amplitude: 0.875 V
Lead Channel Pacing Threshold Amplitude: 1 V
Lead Channel Pacing Threshold Pulse Width: 0.4 ms
Lead Channel Pacing Threshold Pulse Width: 0.4 ms
Lead Channel Sensing Intrinsic Amplitude: 13.375 mV
Lead Channel Sensing Intrinsic Amplitude: 13.375 mV
Lead Channel Sensing Intrinsic Amplitude: 2.125 mV
Lead Channel Sensing Intrinsic Amplitude: 2.125 mV
Lead Channel Setting Pacing Amplitude: 2 V
Lead Channel Setting Pacing Amplitude: 2.5 V
Lead Channel Setting Pacing Pulse Width: 0.4 ms
Lead Channel Setting Sensing Sensitivity: 2.8 mV

## 2020-04-12 NOTE — Progress Notes (Signed)
Remote pacemaker transmission.   

## 2020-04-15 ENCOUNTER — Other Ambulatory Visit: Payer: Self-pay | Admitting: Family Medicine

## 2020-04-15 DIAGNOSIS — Z1231 Encounter for screening mammogram for malignant neoplasm of breast: Secondary | ICD-10-CM

## 2020-04-30 ENCOUNTER — Ambulatory Visit
Admission: RE | Admit: 2020-04-30 | Discharge: 2020-04-30 | Disposition: A | Discharge status: Home / Self Care | Payer: Medicare Other | Source: Ambulatory Visit | Attending: Family Medicine | Admitting: Family Medicine

## 2020-04-30 ENCOUNTER — Other Ambulatory Visit: Payer: Self-pay

## 2020-04-30 DIAGNOSIS — Z1231 Encounter for screening mammogram for malignant neoplasm of breast: Secondary | ICD-10-CM

## 2020-06-20 ENCOUNTER — Other Ambulatory Visit: Payer: Self-pay | Admitting: Physician Assistant

## 2020-06-20 MED ORDER — POTASSIUM CHLORIDE CRYS ER 20 MEQ PO TBCR: 20.0000 meq | EXTENDED_RELEASE_TABLET | Freq: Every day | ORAL | 0 refills | Status: DC

## 2020-07-11 ENCOUNTER — Ambulatory Visit (INDEPENDENT_AMBULATORY_CARE_PROVIDER_SITE_OTHER): Payer: Medicare Other | Admitting: *Deleted

## 2020-07-11 DIAGNOSIS — I495 Sick sinus syndrome: Secondary | ICD-10-CM | POA: Diagnosis not present

## 2020-07-11 LAB — CUP PACEART REMOTE DEVICE CHECK
Battery Remaining Longevity: 68 mo
Battery Voltage: 3 V
Brady Statistic AP VP Percent: 0.14 %
Brady Statistic AP VS Percent: 98.66 %
Brady Statistic AS VP Percent: 0.02 %
Brady Statistic AS VS Percent: 1.19 %
Brady Statistic RA Percent Paced: 98.47 %
Brady Statistic RV Percent Paced: 0.16 %
Date Time Interrogation Session: 20210909114332
Implantable Lead Implant Date: 20070910
Implantable Lead Implant Date: 20070910
Implantable Lead Location: 753859
Implantable Lead Location: 753860
Implantable Lead Model: 5076
Implantable Lead Model: 5076
Implantable Pulse Generator Implant Date: 20170608
Lead Channel Impedance Value: 304 Ohm
Lead Channel Impedance Value: 418 Ohm
Lead Channel Impedance Value: 418 Ohm
Lead Channel Impedance Value: 456 Ohm
Lead Channel Pacing Threshold Amplitude: 0.75 V
Lead Channel Pacing Threshold Amplitude: 1 V
Lead Channel Pacing Threshold Pulse Width: 0.4 ms
Lead Channel Pacing Threshold Pulse Width: 0.4 ms
Lead Channel Sensing Intrinsic Amplitude: 1.625 mV
Lead Channel Sensing Intrinsic Amplitude: 1.625 mV
Lead Channel Sensing Intrinsic Amplitude: 12.25 mV
Lead Channel Sensing Intrinsic Amplitude: 12.25 mV
Lead Channel Setting Pacing Amplitude: 2.25 V
Lead Channel Setting Pacing Amplitude: 2.5 V
Lead Channel Setting Pacing Pulse Width: 0.4 ms
Lead Channel Setting Sensing Sensitivity: 2.8 mV

## 2020-07-12 ENCOUNTER — Other Ambulatory Visit: Payer: Self-pay | Admitting: Physician Assistant

## 2020-07-12 NOTE — Progress Notes (Signed)
Remote pacemaker transmission.   

## 2020-07-17 ENCOUNTER — Other Ambulatory Visit: Payer: Self-pay

## 2020-07-17 ENCOUNTER — Encounter: Payer: Self-pay | Admitting: Internal Medicine

## 2020-07-17 ENCOUNTER — Ambulatory Visit (INDEPENDENT_AMBULATORY_CARE_PROVIDER_SITE_OTHER): Payer: Medicare Other | Admitting: Internal Medicine

## 2020-07-17 VITALS — BP 110/60 | HR 79 | Ht 66.0 in | Wt 162.0 lb

## 2020-07-17 DIAGNOSIS — I10 Essential (primary) hypertension: Secondary | ICD-10-CM | POA: Diagnosis not present

## 2020-07-17 DIAGNOSIS — I495 Sick sinus syndrome: Secondary | ICD-10-CM

## 2020-07-17 DIAGNOSIS — Z950 Presence of cardiac pacemaker: Secondary | ICD-10-CM

## 2020-07-17 MED ORDER — FUROSEMIDE 20 MG PO TABS: 20.0000 mg | ORAL_TABLET | Freq: Every day | ORAL | 3 refills | Status: DC

## 2020-07-17 MED ORDER — APIXABAN 5 MG PO TABS: 5.0000 mg | ORAL_TABLET | Freq: Two times a day (BID) | ORAL | 11 refills | Status: DC

## 2020-07-17 NOTE — Progress Notes (Signed)
HPI [name] returns today for followup. She is a pleasant 79 yo woman with a h/o HTN, sinus node dysfunction, s/p PPM insertion. She has HTN but when I saw her last, her bp was a little low. She denies palpitations but does note dyspnea with exertion. She was found on her cardiac monitor to have up to several hours of atrial fib at a time. She was asymptomatic.  Allergies  Allergen Reactions  . Citalopram     Other reaction(s): Serum Sickness (ALLERGY) Serotonin Syndrome  . Codeine Other (See Comments)    hallucinations  . Cymbalta [Duloxetine Hcl] Other (See Comments)    Serotonin Syndrome  . Metformin And Related     Causes dehydration and causes me to go to the hospital  . Paroxetine Hcl     Serotonin Syndrome  . Trazodone And Nefazodone Other (See Comments)    Serotonin Syndrome  . Caffeine Anxiety    Nervousness      Current Outpatient Medications  Medication Sig Dispense Refill  . amLODipine (NORVASC) 2.5 MG tablet Take 1 tablet (2.5 mg total) by mouth daily. Must keep appt for further refills 90 tablet 0  . atorvastatin (LIPITOR) 80 MG tablet Take 1 tablet (80 mg total) by mouth every evening. 30 tablet 1  . Biotin 1 MG CAPS Take 1 capsule by mouth daily.    Marland Kitchen gabapentin (NEURONTIN) 300 MG capsule Take 900 mg by mouth in the morning and 900 mg by mouth in the afternoon and 900 mg at night    . pantoprazole (PROTONIX) 40 MG tablet Take 1 tablet (40 mg total) by mouth 2 (two) times daily before a meal. 60 tablet 2  . potassium chloride SA (KLOR-CON) 20 MEQ tablet Take 1 tablet (20 mEq total) by mouth daily. Please keep upcoming appt in October for future refills. Thank you 90 tablet 0  . apixaban (ELIQUIS) 5 MG TABS tablet Take 1 tablet (5 mg total) by mouth 2 (two) times daily. 60 tablet 11  . furosemide (LASIX) 20 MG tablet Take 1 tablet (20 mg total) by mouth daily. 90 tablet 3  . losartan (COZAAR) 50 MG tablet Take 1 tablet (50 mg total) by mouth daily. 90 tablet  3   No current facility-administered medications for this visit.     Past Medical History:  Diagnosis Date  . Allergy   . Arthritis   . CAD (coronary artery disease)    a. mild by prior cath; coronary CT 04/2019.  Marland Kitchen Cataract   . Chronic edema   . Complicated migraine   . Diverticulosis   . DM (diabetes mellitus) (HCC)   . Essential hypertension 08/11/2014  . Fibromyalgia   . GERD (gastroesophageal reflux disease)   . History of Serotonin syndrome w/ recurrent residual headaches 08/10/2014  . Hyperlipidemia   . Hypertension   . Internal and external hemorrhoids without complication   . Migraine with visual aura   . Myocardial infarction (HCC)   . Potassium deficiency   . 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/09/2016  . TIA (transient ischemic attack) 11/15/2015  . 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   Past Surgical History:  Procedure Laterality Date  . ABDOMINAL HYSTERECTOMY    . APPENDECTOMY    . bil carple tunnel surgery    . CARDIAC CATHETERIZATION N/A 04/09/2015   Procedure: Left Heart  Cath and Coronary Angiography;  Surgeon: Peter M Swaziland, MD;  Location: [facility] INVASIVE CV LAB;  Service: Cardiovascular;  Laterality: N/A;  . COLONOSCOPY  multiple  . EP IMPLANTABLE DEVICE N/A 04/09/2016   Procedure: PPM Generator Changeout;  Surgeon: Marinus Maw, MD;  Location: [facility] INVASIVE CV LAB;  Service: Cardiovascular;  Laterality: N/A;  . ESOPHAGEAL MANOMETRY N/A 08/02/2017   Procedure: ESOPHAGEAL MANOMETRY (EM);  Surgeon: Napoleon Form, MD;  Location: [facility] ENDOSCOPY;  Service: Endoscopy;  Laterality: N/A;  . KNEE SURGERY     right x 2  . NECK SURGERY     x2  . PACEMAKER INSERTION    . PH IMPEDANCE STUDY N/A 08/02/2017   Procedure: PH IMPEDANCE STUDY;  Surgeon: Napoleon Form, MD;  Location: [facility] ENDOSCOPY;  Service: Endoscopy;  Laterality: N/A;  . UPPER GASTROINTESTINAL  ENDOSCOPY       Family History  Problem Relation Age of Onset  . Coronary artery disease Father   . Prostate cancer Father   . Colon cancer Father   . Lung cancer Father   . Diabetes Father   . Heart attack Father   . Diabetes Daughter   . Dementia Daughter   . Colon cancer Sister        x 2  . Lung cancer Mother   . Dementia Mother   . Colon polyps Sister   . Crohn's disease Sister   . Diabetes Maternal Aunt        multiple  . Breast cancer Maternal Aunt   . Colon cancer Brother   . Kidney disease Daughter   . Diabetes Cousin        multiple  . Diabetes Maternal Uncle        multiple  . Irritable bowel syndrome Sister   . Heart attack Daughter   . Heart attack Brother   . Hypertension Neg Hx   . Esophageal cancer Neg Hx   . Rectal cancer Neg Hx   . Stomach cancer Neg Hx      Social History   Socioeconomic History  . Marital status: Married    Spouse name: Fayrene Fearing  . Number of children: 2  . Years of education: 18  . Highest education level: Not on file  Occupational History  . Occupation: Retired    Associate Professor: RETIRED  Tobacco Use  . Smoking status: Never Smoker  . Smokeless tobacco: Never Used  Vaping Use  . Vaping Use: Never used  Substance and Sexual Activity  . Alcohol use: No    Alcohol/week: 0.0 standard drinks  . Drug use: No  . Sexual activity: Not Currently  Other Topics Concern  . Not on file  Social History Narrative   Patient is married with 2 children. She lives at home with her husband.    Patient is right handed.   Patient has 12 th grade education.   Patient drinks occasional caffeine.   Social Determinants of Health   Financial Resource Strain:   . Difficulty of Paying Living Expenses: Not on file  Food Insecurity:   . Worried About Programme researcher, broadcasting/film/video in the Last Year: Not on file  . Ran Out of Food in the Last Year: Not on file  Transportation Needs:   . Lack of Transportation (Medical): Not on file  . Lack of  Transportation (Non-Medical): Not on file  Physical Activity:   . Days of Exercise per Week: Not on file  . Minutes of Exercise per Session: Not  on file  Stress:   . Feeling of Stress : Not on file  Social Connections:   . Frequency of Communication with Friends and Family: Not on file  . Frequency of Social Gatherings with Friends and Family: Not on file  . Attends Religious Services: Not on file  . Active Member of Clubs or Organizations: Not on file  . Attends Banker Meetings: Not on file  . Marital Status: Not on file  Intimate Partner Violence:   . Fear of Current or Ex-Partner: Not on file  . Emotionally Abused: Not on file  . Physically Abused: Not on file  . Sexually Abused: Not on file     BP 110/60   Pulse 79   Ht 5\' 6"  (1.676 m)   Wt 162 lb (73.5 kg)   SpO2 97%   BMI 26.15 kg/m   Physical Exam:  Well appearing NAD HEENT: Unremarkable Neck:  6 cm JVD, no thyromegally Lymphatics:  No adenopathy Back:  No CVA tenderness Lungs:  Clear with no wheezes HEART:  Regular rate rhythm, no murmurs, no rubs, no clicks Abd:  soft, positive bowel sounds, no organomegally, no rebound, no guarding Ext:  2 plus pulses, no edema, no cyanosis, no clubbing Skin:  No rashes no nodules Neuro:  CN II through XII intact, motor grossly intact  EKG - nsr with atrial pacing  DEVICE  Normal device function.  See PaceArt for details.   Assess/Plan: 1. PAF - this is a new finding. Her CHADS vasc is high and she will start Elliquis. 2. Diastolic heart failure - she has dyspnea on exertion. I have asked that she stop the HCTZ and start low dose lasix 20 mg daily. 3. PPM - her medtronic DDD PM is working normally. We will recheck in several months. 4. HTN - her bp is well controlled.   .

## 2020-07-17 NOTE — Patient Instructions (Addendum)
Medication Instructions:  Your physician has recommended you make the following change in your medication:   1.  STOP PLAVIX  2.  STOP hydrochlorothiazide (HCTZ)  3.  START taking ELIQUIS 5 mg-  One tablet by mouth TWICE a day  4.  START taking furosemide (lasix) 20 mg- One tablet by mouth daily.   Labwork: You will get lab work at the [facility] in 3 weeks on August 09, 2020 after your appt with Ronie Spies.  You do NOT need to be fasting.   Testing/Procedures: None ordered.  Follow-Up: Your physician wants you to follow-up in: one year with Dr. Ladona Ridgel.   You will receive a reminder letter in the mail two months in advance. If you don't receive a letter, please call our office to schedule the follow-up appointment.  Remote monitoring is used to monitor your Pacemaker from home. This monitoring reduces the number of office visits required to check your device to one time per year. It allows [place] to keep an eye on the functioning of your device to ensure it is working properly. You are scheduled for a device check from home on 10/10/2020. You may send your transmission at any time that day. If you have a wireless device, the transmission will be sent automatically. After your physician reviews your transmission, you will receive a postcard with your next transmission date.  Any Other Special Instructions Will Be Listed Below (If Applicable).  If you need a refill on your cardiac medications before your next appointment, please call your pharmacy.

## 2020-07-22 NOTE — Addendum Note (Signed)
Addended by: Solon Augusta on: 07/22/2020 05:22 PM   Modules accepted: Orders

## 2020-08-06 ENCOUNTER — Encounter: Payer: Self-pay | Admitting: Physician Assistant

## 2020-08-06 NOTE — Progress Notes (Signed)
fatig   Cardiology Office Note    Date:  08/09/2020   ID:  [name], DOB Dec 15, 1940, MRN [id number]  PCP:  Gwenlyn FoundEksir, Samantha A, MD  Cardiologist:  [name], MD  Electrophysiologist:  Lewayne BuntingGregg Taylor, MD   Chief Complaint: f/u edema, mild CAD, also recently diagnosed PAF  History of Present Illness:   [name] is a 79 y.o. female with history of HTN, sinus node dysfunction s/p MDT PPM (gen change 2017), migraines, DM, fibromyalgia, HLD, CVA, TIAs, hiatal hernia, nonobstructive CAD, PAF, RLS, chronic edema who presents for routine general cardiology follow-up.  She has prior history of evalulation for SOB and chest pain. Cardiac workup was previously unrevealing. Cath 04/2015 showed 10% prox RCA and 25% prox LAD with normal LVEF and normal LVEDP. She then saw Dr. Sherene SiresWert in 2020 for evaluation of SOB and nocturnal wheezing which he felt were out of proportion to objective pulmonary findings, possibly related to deconditioning and potentially anxiety/depression. Labs were unrevealing including normal d-dimer and BNP. I saw her for this issue last year as well. Imdur was started in case there was a component of microvascular angina. We pursued coronary CT 04/2019 which showed showed calcium score of 34 (42nd percentile), mild nonobstructive CAD (0-25% RCA, 25-39% prox-mid LAD), small hiatal hernia. Device interrogation was unrevealing at that time. She actually felt better on the Imdur but then reported some leg swelling. Echo 05/08/19 showed EF 60-65%, normal diastolic function despite mild LA dilation, mildly increased LV wall thickness, normal RV, mild LAE/RAE. We adjusted her HCTZ and amlodipine with improvement. She has since stopped Imdur. Her recent device interrogation detected new onset atrial fibrillation so Dr. Ladona Ridgelaylor stopped her Plavix and started her on Eliquis. He also switched her HCTZ to Lasix for possible component of diastolic congestion.  She is seen back  for follow-up feeling stable. She reports she possibly had some increased energy with the change in diuretic but in general reports the same chronic DOE and fatigue as before. She is under a lot of responsibility as the full time caregiver for her 77 y/o husband which can be contentious at times. She does not really have a lot of time for herself. She does not complain of chest pain or significant palpitations at this time. In retrospect she does believe she may have felt palpitations associated with one of the episodes detected on her September device interrogation. Her husband sees Dr. Cristal Deerhristopher for his cardiology care.  Labwork independently reviewed: 05/23/20 K 3.8, Cr 0.89, glucose 126 03/13/19 showed normal BNP, D-dimer, TSH, CBC, CMET. Last lipids in 2017 showed LDL 84   Past Medical History:  Diagnosis Date  . Allergy   . Arthritis   . CAD (coronary artery disease)    a. mild by prior cath; coronary CT 04/2019.  Marland Kitchen. Cataract   . Chronic edema   . Complicated migraine   . Diverticulosis   . DM (diabetes mellitus) (HCC)   . Essential hypertension 08/11/2014  . Fibromyalgia   . GERD (gastroesophageal reflux disease)   . History of Serotonin syndrome w/ recurrent residual headaches 08/10/2014  . Hyperlipidemia   . Hypertension   . Internal and external hemorrhoids without complication   . Migraine with visual aura   . Myocardial infarction (HCC)   . PAF (paroxysmal atrial fibrillation) (HCC)   . Potassium deficiency   . 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/09/2016  . TIA (transient ischemic attack) 11/15/2015  . Type 2 diabetes mellitus with diabetic  polyneuropathy (HCC) 07/24/2015    Past Surgical History:  Procedure Laterality Date  . ABDOMINAL HYSTERECTOMY    . APPENDECTOMY    . bil carple tunnel surgery    . CARDIAC CATHETERIZATION N/A 04/09/2015   Procedure: Left Heart Cath and Coronary Angiography;  Surgeon: Peter M Swaziland, MD;  Location: [facility] INVASIVE CV LAB;  Service:  Cardiovascular;  Laterality: N/A;  . COLONOSCOPY  multiple  . EP IMPLANTABLE DEVICE N/A 04/09/2016   Procedure: PPM Generator Changeout;  Surgeon: Marinus Maw, MD;  Location: [facility] INVASIVE CV LAB;  Service: Cardiovascular;  Laterality: N/A;  . ESOPHAGEAL MANOMETRY N/A 08/02/2017   Procedure: ESOPHAGEAL MANOMETRY (EM);  Surgeon: Napoleon Form, MD;  Location: [facility] ENDOSCOPY;  Service: Endoscopy;  Laterality: N/A;  . KNEE SURGERY     right x 2  . NECK SURGERY     x2  . PACEMAKER INSERTION    . PH IMPEDANCE STUDY N/A 08/02/2017   Procedure: PH IMPEDANCE STUDY;  Surgeon: Napoleon Form, MD;  Location: [facility] ENDOSCOPY;  Service: Endoscopy;  Laterality: N/A;  . UPPER GASTROINTESTINAL ENDOSCOPY      Current Medications: Current Meds  Medication Sig  . amLODipine (NORVASC) 2.5 MG tablet Take 1 tablet (2.5 mg total) by mouth daily.  Marland Kitchen apixaban (ELIQUIS) 5 MG TABS tablet Take 1 tablet (5 mg total) by mouth 2 (two) times daily.  Marland Kitchen atorvastatin (LIPITOR) 80 MG tablet Take 1 tablet (80 mg total) by mouth every evening.  . furosemide (LASIX) 20 MG tablet Take 1 tablet (20 mg total) by mouth daily.  Marland Kitchen gabapentin (NEURONTIN) 300 MG capsule Take 900 mg by mouth in the morning and 900 mg by mouth in the afternoon and 1200 mg at night  . glipiZIDE (GLUCOTROL) 5 MG tablet Take by mouth 2 (two) times daily before a meal.  . pantoprazole (PROTONIX) 40 MG tablet Take 1 tablet (40 mg total) by mouth 2 (two) times daily before a meal.  . potassium chloride SA (KLOR-CON) 20 MEQ tablet Take 1 tablet (20 mEq total) by mouth daily.  . [DISCONTINUED] amLODipine (NORVASC) 2.5 MG tablet Take 1 tablet (2.5 mg total) by mouth daily. Must keep appt for further refills  . [DISCONTINUED] potassium chloride SA (KLOR-CON) 20 MEQ tablet Take 1 tablet (20 mEq total) by mouth daily. Please keep upcoming appt in October for future refills. Thank you      Allergies:   Citalopram, Codeine, Cymbalta [duloxetine hcl], Metformin  and related, Paroxetine hcl, Trazodone and nefazodone, and Caffeine   Social History   Socioeconomic History  . Marital status: Married    Spouse name: Fayrene Fearing  . Number of children: 2  . Years of education: 75  . Highest education level: Not on file  Occupational History  . Occupation: Retired    Associate Professor: RETIRED  Tobacco Use  . Smoking status: Never Smoker  . Smokeless tobacco: Never Used  Vaping Use  . Vaping Use: Never used  Substance and Sexual Activity  . Alcohol use: No    Alcohol/week: 0.0 standard drinks  . Drug use: No  . Sexual activity: Not Currently  Other Topics Concern  . Not on file  Social History Narrative   Patient is married with 2 children. She lives at home with her husband.    Patient is right handed.   Patient has 12 th grade education.   Patient drinks occasional caffeine.   Social Determinants of Health   Financial Resource Strain:   . Difficulty  of Paying Living Expenses: Not on file  Food Insecurity:   . Worried About Programme researcher, broadcasting/film/video in the Last Year: Not on file  . Ran Out of Food in the Last Year: Not on file  Transportation Needs:   . Lack of Transportation (Medical): Not on file  . Lack of Transportation (Non-Medical): Not on file  Physical Activity:   . Days of Exercise per Week: Not on file  . Minutes of Exercise per Session: Not on file  Stress:   . Feeling of Stress : Not on file  Social Connections:   . Frequency of Communication with Friends and Family: Not on file  . Frequency of Social Gatherings with Friends and Family: Not on file  . Attends Religious Services: Not on file  . Active Member of Clubs or Organizations: Not on file  . Attends Banker Meetings: Not on file  . Marital Status: Not on file     Family History:  The patient's family history includes Breast cancer in her maternal aunt; Colon cancer in her brother, father, and sister; Colon polyps in her sister; Coronary artery disease in her  father; Crohn's disease in her sister; Dementia in her daughter and mother; Diabetes in her cousin, daughter, father, maternal aunt, and maternal uncle; Heart attack in her brother, daughter, and father; Irritable bowel syndrome in her sister; Kidney disease in her daughter; Lung cancer in her father and mother; Prostate cancer in her father. There is no history of Hypertension, Esophageal cancer, Rectal cancer, or Stomach cancer.  ROS:   Please see the history of present illness. Otherwise, review of systems is positive for nocturnal wheezing sometimes All other systems are reviewed and otherwise negative.    EKGs/Labs/Other Studies Reviewed:    Studies reviewed are outlined and summarized above. Reports included below if pertinent.  2D Echo 05/2019  1. The left ventricle has normal systolic function with an ejection  fraction of 60-65%. The cavity size was normal. There is mildly increased  left ventricular wall thickness. Mitral medial e' is 8 cm/s, suggesting  normal diastolic function despite mild  LA dilation.  2. The right ventricle has normal systolic function. The cavity was  normal. There is no increase in right ventricular wall thickness. Right  ventricular systolic pressure is normal with an estimated pressure of 28.4  mmHg.  3. Left atrial size was mildly dilated.  4. Right atrial size was mildly dilated  Cor CT 04/2019 FINDINGS: A 120 kV prospective scan was triggered in the descending thoracic aorta at 111 HU's. Axial non-contrast 3 mm slices were carried out through the heart. The data set was analyzed on a dedicated work station and scored using the Agatson method. Gantry rotation speed was 250 msecs and collimation was .6 mm. No beta blockade and 0.8 mg of sl NTG was given. The 3D data set was reconstructed in 5% intervals of the 67-82 % of the R-R cycle. Diastolic phases were analyzed on a dedicated work station using MPR, MIP and VRT modes. The patient  received 80 cc of contrast.  Aorta: Normal size. Minimal diffuse atherosclerotic plaque and calcifications. No dissection.  Aortic Valve:  Trileaflet.  No calcifications.  Coronary Arteries:  Normal coronary origin.  Right dominance.  RCA is a large dominant artery that gives rise to PDA and PLVB. There is minimal diffuse calcified plaque with stenosis 0-25%.  Left main is a short artery that gives rise to LAD and LCX arteries. Left main has  no plaque.  LAD is a large vessel that gives rise to two small diagonal arteries. There is mild calcified plaque in the proximal and mid LAD with stenosis 25-49%.  LCX is a non-dominant artery that gives rise to one large OM1 branch. There is no plaque.  Other findings:  Normal pulmonary vein drainage into the left atrium.  A large left atrial appendage without an evidence for a thrombus.  Normal size of the pulmonary artery.  IMPRESSION: 1. Coronary calcium score of 34. This was 5 percentile for age and sex matched control.  2. Normal coronary origin with right dominance.  3. Mild non-obstructive CAD, aggressive risk factor modification is recommended.  4. Pacemaker leads are seen in the right atrium and right ventricle.   Electronically Signed   By: Tobias Alexander   On: 04/10/2019 12:45  IMPRESSION: Small hiatal hernia.  No acute extra cardiac abnormality.  Electronically Signed: By: Charlett Nose M.D. On: 04/10/2019 12:33     EKG:  EKG is not ordered today given recent study done 07/17/20 - have personally reviewed which showed NSR, atrial paced rhythm, nonspecific TW changes without acute change from prior  Recent Labs: No results found for requested labs within last 8760 hours.  Recent Lipid Panel    Component Value Date/Time   CHOL 161 05/11/2016 0638   CHOL 158 11/15/2015 1415   TRIG 134 05/11/2016 0638   HDL 50 05/11/2016 0638   HDL 50 11/15/2015 1415   CHOLHDL 3.2 05/11/2016 0638    VLDL 27 05/11/2016 0638   LDLCALC 84 05/11/2016 0638   LDLCALC 82 11/15/2015 1415    PHYSICAL EXAM:    VS:  BP 118/68   Pulse 85   Ht  (1.676 m)   Wt 160 lb 6.4 oz (72.8 kg)   SpO2 95%   BMI 25.89 kg/m   BMI: Body mass index is 25.89 kg/m.  GEN: Well nourished, well developed WF, in no acute distress HEENT: normocephalic, atraumatic Neck: no JVD, carotid bruits, or masses Cardiac: RRR; no murmurs, rubs, or gallops, no edema (bilateral compression hose in place) Respiratory:  clear to auscultation bilaterally, normal work of breathing GI: soft, nontender, nondistended, + BS MS: no deformity or atrophy Skin: warm and dry, no rash Neuro:  Alert and Oriented x 3, Strength and sensation are intact, follows commands Psych: euthymic mood, full affect  Wt Readings from Last 3 Encounters:  08/09/20 160 lb 6.4 oz (72.8 kg)  07/17/20 162 lb (73.5 kg)  07/03/19 167 lb (75.8 kg)     ASSESSMENT & PLAN:   1. Paroxysmal atrial fibrillation - maintaining NSR on exam today. Afib burden to be followed by pacemaker interrogation, only 2 episodes on last device interrogation. At most recent OV Dr. Ladona Ridgel did not advise any AVN blocking agents. She is tolerating Eliquis well. CHADS2VA2SC score is 8  for HTN, age, DM, CVA/TIA, vascular disease, female, hence long term anticoagulation planned. BMET/CBC are planned for today. Will also plan to obtain a baseline TSH as well. She confirms she is no longer on biotin, which is known to interfere with thyroid assay results. 2. Chronic shortness of breath - continues to be a cardinal complaint for her. Denies any significant change in symptoms from last year. We do not really have a cardiac reason for this. Her cardiac testing has only shown mild nonobstructive CAD. BNP and d-dimer have been normal. Her CT overread showed no significant pulmonary findings. She does not appear volume overloaded  on examination and weight is down a few lb from last OV. Dr.  Sherene Sires previously felt this was due to deconditioning +/- component of anxiety/depression as well. She is understandably under a lot of stress as the caregiver for her husband. 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 I encouraged her to begin a trial of an exercise program to build up endurance. I do not see she has had formal PFTs in the past so we will arrange. If these are abnormal will request she get in for a second opinion with pulmonology. Will also route to Dr. Ladona Ridgel to reply as to whether there is any pacemaker optimization that can be performed that might help with her endurance. 3. Mild CAD - no anginal symptoms. Denies escalation in chronic baseline dyspnea. Continue statin (lipids followed by PCP). Not on BB due to h/o sinus node dysfunction. Not on ASA due to concomitant Eliquis. 4. Essential HTN - controlled on present regimen. 5. Chronic fatigue and restless leg syndrome - she inquires about whether perhaps her gabapentin dose is affecting her energy levels. We will check a ferritin level today with labs to assess given relationship with RLS. I have encouraged her to speak with the provider who prescribes her gabapentin regarding her concerns about this medicine.  Disposition: F/u with Dr. Swaziland in 6 months, routine f/u as planned with EP as well.  Medication Adjustments/Labs and Tests Ordered: Current medicines are reviewed at length with the patient today.  Concerns regarding medicines are outlined above. Medication changes, Labs and Tests ordered today are summarized above and listed in the Patient Instructions accessible in Encounters.   Signed, Laurann Montana, PA-C  08/09/2020 9:04 AM    [facility] [address] [place], [place], [place]  [address] Phone: [phone]; Fax: [phone]

## 2020-08-09 ENCOUNTER — Other Ambulatory Visit: Payer: Medicare Other | Admitting: *Deleted

## 2020-08-09 ENCOUNTER — Encounter: Payer: Self-pay | Admitting: Physician Assistant

## 2020-08-09 ENCOUNTER — Ambulatory Visit (INDEPENDENT_AMBULATORY_CARE_PROVIDER_SITE_OTHER): Payer: Medicare Other | Admitting: Physician Assistant

## 2020-08-09 ENCOUNTER — Other Ambulatory Visit: Payer: Self-pay

## 2020-08-09 VITALS — BP 118/68 | HR 85 | Ht 66.0 in | Wt 160.4 lb

## 2020-08-09 DIAGNOSIS — I10 Essential (primary) hypertension: Secondary | ICD-10-CM

## 2020-08-09 DIAGNOSIS — R0602 Shortness of breath: Secondary | ICD-10-CM | POA: Diagnosis not present

## 2020-08-09 DIAGNOSIS — I2510 Atherosclerotic heart disease of native coronary artery without angina pectoris: Secondary | ICD-10-CM | POA: Diagnosis not present

## 2020-08-09 DIAGNOSIS — Z950 Presence of cardiac pacemaker: Secondary | ICD-10-CM

## 2020-08-09 DIAGNOSIS — I480 Paroxysmal atrial fibrillation: Secondary | ICD-10-CM

## 2020-08-09 DIAGNOSIS — G2581 Restless legs syndrome: Secondary | ICD-10-CM

## 2020-08-09 DIAGNOSIS — R5382 Chronic fatigue, unspecified: Secondary | ICD-10-CM

## 2020-08-09 DIAGNOSIS — I495 Sick sinus syndrome: Secondary | ICD-10-CM

## 2020-08-09 LAB — CBC WITH DIFFERENTIAL/PLATELET
Basophils Absolute: 0 10*3/uL (ref 0.0–0.2)
Basos: 0 %
EOS (ABSOLUTE): 0.1 10*3/uL (ref 0.0–0.4)
Eos: 2 %
Hematocrit: 41.6 % (ref 34.0–46.6)
Hemoglobin: 13.8 g/dL (ref 11.1–15.9)
Immature Grans (Abs): 0 10*3/uL (ref 0.0–0.1)
Immature Granulocytes: 0 %
Lymphocytes Absolute: 1.5 10*3/uL (ref 0.7–3.1)
Lymphs: 30 %
MCH: 30.1 pg (ref 26.6–33.0)
MCHC: 33.2 g/dL (ref 31.5–35.7)
MCV: 91 fL (ref 79–97)
Monocytes Absolute: 0.5 10*3/uL (ref 0.1–0.9)
Monocytes: 10 %
Neutrophils Absolute: 2.8 10*3/uL (ref 1.4–7.0)
Neutrophils: 58 %
Platelets: 221 10*3/uL (ref 150–450)
RBC: 4.58 x10E6/uL (ref 3.77–5.28)
RDW: 12.9 % (ref 11.7–15.4)
WBC: 4.9 10*3/uL (ref 3.4–10.8)

## 2020-08-09 LAB — BASIC METABOLIC PANEL
BUN/Creatinine Ratio: 19 (ref 12–28)
BUN: 17 mg/dL (ref 8–27)
CO2: 24 mmol/L (ref 20–29)
Calcium: 9.3 mg/dL (ref 8.7–10.3)
Chloride: 107 mmol/L — ABNORMAL HIGH (ref 96–106)
Creatinine, Ser: 0.91 mg/dL (ref 0.57–1.00)
GFR calc Af Amer: 70 mL/min/{1.73_m2} (ref >59)
GFR calc non Af Amer: 61 mL/min/{1.73_m2} (ref >59)
Glucose: 138 mg/dL — ABNORMAL HIGH (ref 65–99)
Potassium: 3.7 mmol/L (ref 3.5–5.2)
Sodium: 144 mmol/L (ref 134–144)

## 2020-08-09 MED ORDER — POTASSIUM CHLORIDE CRYS ER 20 MEQ PO TBCR: 20.0000 meq | EXTENDED_RELEASE_TABLET | Freq: Every day | ORAL | 3 refills | Status: DC

## 2020-08-09 MED ORDER — AMLODIPINE BESYLATE 2.5 MG PO TABS: 2.5000 mg | ORAL_TABLET | Freq: Every day | ORAL | 3 refills | Status: DC

## 2020-08-09 NOTE — Patient Instructions (Addendum)
Medication Instructions:  Your physician recommends that you continue on your current medications as directed. Please refer to the Current Medication list given to you today.  *If you need a refill on your cardiac medications before your next appointment, please call your pharmacy*   Lab Work: TODAY:  CBC W/DIFF, TSH, FERRITIN, & BMET  If you have labs (blood work) drawn today and your tests are completely normal, you will receive your results only by: Marland Kitchen MyChart Message (if you have MyChart) OR . A paper copy in the mail If you have any lab test that is abnormal or we need to change your treatment, we will call you to review the results.   Testing/Procedures: Your physician has recommended that you have a pulmonary function test. Pulmonary Function Tests are a group of tests that measure how well air moves in and out of your lungs.    Follow-Up: At [facility], you and your health needs are our priority.  As part of our continuing mission to provide you with exceptional heart care, we have created designated Provider Care Teams.  These Care Teams include your primary Cardiologist (physician) and Advanced Practice Providers (APPs -  Physician Assistants and Nurse Practitioners) who all work together to provide you with the care you need, when you need it.  We recommend signing up for the patient portal called "MyChart".  Sign up information is provided on this After Visit Summary.  MyChart is used to connect with patients for Virtual Visits (Telemedicine).  Patients are able to view lab/test results, encounter notes, upcoming appointments, etc.  Non-urgent messages can be sent to your provider as well.   To learn more about what you can do with MyChart, go to [email or web address].    Your next appointment:   6 month(s)  The format for your next appointment:   In Person  Provider:   Peter Swaziland, MD   Other Instructions

## 2020-08-10 LAB — TSH: TSH: 3.55 u[IU]/mL (ref 0.450–4.500)

## 2020-08-10 LAB — FERRITIN: Ferritin: 104 ng/mL (ref 15–150)

## 2020-08-13 ENCOUNTER — Telehealth: Payer: Self-pay | Admitting: *Deleted

## 2020-08-13 NOTE — Telephone Encounter (Signed)
Returned call to pt.  She has been made aware of her lab results. I am waiting on response as to whether we want to do GXT or Exercise Myoview.  I will send pt her instructions via mychart once I get the order.   Pt verbalized understanding.

## 2020-08-13 NOTE — Telephone Encounter (Signed)
-----   Message from Dyann Kief, [place] sent at 08/13/2020  7:47 AM EDT -----   ----- Message ----- From: Marinus Maw, MD Sent: 08/11/2020   9:50 PM EDT To: Laurann Montana, PA-C  I would typically have her perform a cardiopulmonary stress test if she can exercise.GT ----- Message ----- From: Estrella Deeds Sent: 08/09/2020   9:23 AM EDT To: Marinus Maw, MD  Bruna Potter, I saw this patient back in clinic - she has had chronic DOE since I've known her. Cardiac CT showed only mild CAD and echo looked fine. BNP has historically been normal. Just wondering if there is anything from a pacer optimization standpoint that could potentially help with her endurance with exertion. I am also obtaining PFTs. She previously saw Dr. Sherene Sires who felt this was due to depression and deconditioning. Please let me know if you think there is anything settings-wise on her device that would help. [name]

## 2020-08-13 NOTE — Telephone Encounter (Signed)
Call placed to pt regarding message below.  Left a message for her to call back.

## 2020-08-13 NOTE — Telephone Encounter (Signed)
Placed call back to pt to let her know that when Kriste Basque is back in the office, she will take a look and decide whether she wants to pursue the cardiopulmonary stress test for pt or not and I will call pt back. She was ok with this and thanked me for the call.

## 2020-08-13 NOTE — Progress Notes (Signed)
Can you ask if patient can exercise? Dr. Ladona Ridgel recommends cardiopulmonary stress test. Please order under Dayna if she can. thanks

## 2020-08-13 NOTE — Telephone Encounter (Signed)
-----   Message from Laurann Montana, [place] sent at 08/10/2020  7:47 AM EDT ----- Ferritin and thyroid normal. Other labs look ok but likely went to Dr Lubertha Basque box so will defer to his review on those.

## 2020-08-13 NOTE — Telephone Encounter (Signed)
Patient returned you call. 

## 2020-08-19 ENCOUNTER — Telehealth: Payer: Self-pay | Admitting: *Deleted

## 2020-08-19 DIAGNOSIS — R0602 Shortness of breath: Secondary | ICD-10-CM

## 2020-08-19 NOTE — Telephone Encounter (Signed)
New message    Patient returned your call  

## 2020-08-19 NOTE — Telephone Encounter (Signed)
Call placed to pt regarding message below. Left a message for pt to return my call.

## 2020-08-19 NOTE — Telephone Encounter (Signed)
-----   Message from Laurann Montana, [place] sent at 08/19/2020  2:00 PM EDT ----- I talked to Dr. Ladona Ridgel and he confirms he would feel comfortable having her do a CPX. It would help gather information about her heart rate response as well as her pulmonary capability. As Elon Jester said this is different than a GXT or Lexiscan. What they will do is have her exercise while hooked up to a monitor that measures her heart rate and pulmonary/oxygen requirements. He wanted to mention that his preference would be treadmill instead of bike so that he can see her heart rate variation. Victorino Dike, you can let her know these recommendations? If she wants to hold off and await PFTs, that's fine too - this is just the ideal test to determine if her shortness of breath is related to an actual physiologic issue versus deconditioning. Thank you! ----- Message ----- From: Collier Bullock Sent: 08/13/2020  10:42 AM EDT To: Laurann Montana, PA-C, Elliot Cousin, RMA  Victorino Dike, We certainly don't want to put her into Afib but it looks like she's been having it per Dr. Lubertha Basque note. Cardiopulmonary stress testing is done over in CHF clinic and is different than GXT or exercise myoview. Will hold off on ordering and let Dayna review when she returns next week. Thanks, Elon Jester ----- Message ----- From: Elliot Cousin, RMA Sent: 08/13/2020   9:37 AM EDT To: Dyann Kief, PA-C  Pt states that she is willing to try it again, but the last one she did as soon as she started walking on the treadmill, she went into A-fib and they made her stop. Please advise!  Do we want GXT or Exercise Myoview? ----- Message ----- From: Dyann Kief, PA-C Sent: 08/13/2020   7:47 AM EDT To: Elliot Cousin, RMA    ----- Message ----- From: Marinus Maw, MD Sent: 08/11/2020   9:50 PM EDT To: Laurann Montana, PA-C  I would typically have her perform a cardiopulmonary stress test if she can exercise.GT ----- Message ----- From: Estrella Deeds Sent: 08/09/2020   9:23 AM EDT To: Marinus Maw, MD  Bruna Potter, I saw this patient back in clinic - she has had chronic DOE since I've known her. Cardiac CT showed only mild CAD and echo looked fine. BNP has historically been normal. Just wondering if there is anything from a pacer optimization standpoint that could potentially help with her endurance with exertion. I am also obtaining PFTs. She previously saw Dr. Sherene Sires who felt this was due to depression and deconditioning. Please let me know if you think there is anything settings-wise on her device that would help. Dayna

## 2020-08-19 NOTE — Telephone Encounter (Signed)
Returned call to pt.  She has been made aware of recommendations below regarding her having a CPX. Pt is agreeable and we will order and someone from scheduling will contact her to set up.  Pt verbalized understanding.

## 2020-09-10 ENCOUNTER — Ambulatory Visit (INDEPENDENT_AMBULATORY_CARE_PROVIDER_SITE_OTHER): Payer: Medicare Other | Admitting: Internal Medicine

## 2020-09-10 ENCOUNTER — Other Ambulatory Visit: Payer: Self-pay

## 2020-09-10 DIAGNOSIS — R0602 Shortness of breath: Secondary | ICD-10-CM

## 2020-09-10 LAB — PULMONARY FUNCTION TEST
DL/VA % pred: 99 %
DL/VA: 4.03 ml/min/mmHg/L
DLCO cor % pred: 68 %
DLCO cor: 13.9 ml/min/mmHg
DLCO unc % pred: 69 %
DLCO unc: 14.07 ml/min/mmHg
FEF 25-75 Post: 2.76 L/sec
FEF 25-75 Pre: 1.56 L/sec
FEF2575-%Change-Post: 77 %
FEF2575-%Pred-Post: 164 %
FEF2575-%Pred-Pre: 92 %
FEV1-%Change-Post: 10 %
FEV1-%Pred-Post: 88 %
FEV1-%Pred-Pre: 80 %
FEV1-Post: 1.97 L
FEV1-Pre: 1.79 L
FEV1FVC-%Change-Post: 8 %
FEV1FVC-%Pred-Pre: 106 %
FEV6-%Change-Post: 2 %
FEV6-%Pred-Post: 81 %
FEV6-%Pred-Pre: 79 %
FEV6-Post: 2.31 L
FEV6-Pre: 2.27 L
FEV6FVC-%Pred-Post: 105 %
FEV6FVC-%Pred-Pre: 105 %
FVC-%Change-Post: 2 %
FVC-%Pred-Post: 77 %
FVC-%Pred-Pre: 76 %
FVC-Post: 2.31 L
FVC-Pre: 2.27 L
Post FEV1/FVC ratio: 85 %
Post FEV6/FVC ratio: 100 %
Pre FEV1/FVC ratio: 79 %
Pre FEV6/FVC Ratio: 100 %
RV % pred: 64 %
RV: 1.57 L
TLC % pred: 79 %
TLC: 4.26 L

## 2020-09-10 NOTE — Progress Notes (Signed)
Full PFT performed today. °

## 2020-09-12 ENCOUNTER — Other Ambulatory Visit: Payer: Self-pay

## 2020-09-12 ENCOUNTER — Ambulatory Visit (HOSPITAL_COMMUNITY): Payer: Medicare Other | Attending: Family Medicine

## 2020-09-12 DIAGNOSIS — R0602 Shortness of breath: Secondary | ICD-10-CM | POA: Insufficient documentation

## 2020-10-10 ENCOUNTER — Ambulatory Visit (INDEPENDENT_AMBULATORY_CARE_PROVIDER_SITE_OTHER): Payer: Medicare Other

## 2020-10-10 ENCOUNTER — Telehealth: Payer: Self-pay | Admitting: Internal Medicine

## 2020-10-10 DIAGNOSIS — I495 Sick sinus syndrome: Secondary | ICD-10-CM

## 2020-10-10 NOTE — Telephone Encounter (Signed)
Called patient to inquire about [employer] monitor to see if patient is having symptoms of any kind to warrant a transmission.  No answer, LMOVM.

## 2020-10-10 NOTE — Telephone Encounter (Signed)
  1. Has your device fired? no  2. Is you device beeping? no  3. Are you experiencing draining or swelling at device site? no  4. Are you calling to see if we received your device transmission? no  5. Have you passed out? no  Patient she has been trying to send a transmission but it has not been working. She states she spoke with [employer] and they are sending her a new machine, that should arrive in 5 days.    Please route to Device Clinic Pool

## 2020-10-11 NOTE — Telephone Encounter (Signed)
Patient was calling in regards to sending a manual transmission d/t on schedule. Advised when new monitor arrives to send one and call us with any issues. Verbalized understanding.

## 2020-10-15 LAB — CUP PACEART REMOTE DEVICE CHECK
Battery Remaining Longevity: 61 mo
Battery Voltage: 3 V
Brady Statistic AP VP Percent: 0.12 %
Brady Statistic AP VS Percent: 98.97 %
Brady Statistic AS VP Percent: 0 %
Brady Statistic AS VS Percent: 0.92 %
Brady Statistic RA Percent Paced: 98.78 %
Brady Statistic RV Percent Paced: 0.13 %
Date Time Interrogation Session: 20211214091237
Implantable Lead Implant Date: 20070910
Implantable Lead Implant Date: 20070910
Implantable Lead Location: 753859
Implantable Lead Location: 753860
Implantable Lead Model: 5076
Implantable Lead Model: 5076
Implantable Pulse Generator Implant Date: 20170608
Lead Channel Impedance Value: 285 Ohm
Lead Channel Impedance Value: 399 Ohm
Lead Channel Impedance Value: 418 Ohm
Lead Channel Impedance Value: 456 Ohm
Lead Channel Pacing Threshold Amplitude: 0.75 V
Lead Channel Pacing Threshold Amplitude: 1 V
Lead Channel Pacing Threshold Pulse Width: 0.4 ms
Lead Channel Pacing Threshold Pulse Width: 0.4 ms
Lead Channel Sensing Intrinsic Amplitude: 1.75 mV
Lead Channel Sensing Intrinsic Amplitude: 1.75 mV
Lead Channel Sensing Intrinsic Amplitude: 12.25 mV
Lead Channel Sensing Intrinsic Amplitude: 12.25 mV
Lead Channel Setting Pacing Amplitude: 2 V
Lead Channel Setting Pacing Amplitude: 2.5 V
Lead Channel Setting Pacing Pulse Width: 0.4 ms
Lead Channel Setting Sensing Sensitivity: 2.8 mV

## 2020-10-23 NOTE — Progress Notes (Signed)
Remote pacemaker transmission.   

## 2021-01-09 ENCOUNTER — Ambulatory Visit (INDEPENDENT_AMBULATORY_CARE_PROVIDER_SITE_OTHER): Payer: Medicare Other

## 2021-01-09 DIAGNOSIS — I495 Sick sinus syndrome: Secondary | ICD-10-CM

## 2021-01-12 LAB — CUP PACEART REMOTE DEVICE CHECK
Battery Remaining Longevity: 61 mo
Battery Voltage: 3 V
Brady Statistic AP VP Percent: 0.09 %
Brady Statistic AP VS Percent: 98.21 %
Brady Statistic AS VP Percent: 0.2 %
Brady Statistic AS VS Percent: 1.49 %
Brady Statistic RA Percent Paced: 98.17 %
Brady Statistic RV Percent Paced: 0.31 %
Date Time Interrogation Session: 20220310113942
Implantable Lead Implant Date: 20070910
Implantable Lead Implant Date: 20070910
Implantable Lead Location: 753859
Implantable Lead Location: 753860
Implantable Lead Model: 5076
Implantable Lead Model: 5076
Implantable Pulse Generator Implant Date: 20170608
Lead Channel Impedance Value: 323 Ohm
Lead Channel Impedance Value: 399 Ohm
Lead Channel Impedance Value: 418 Ohm
Lead Channel Impedance Value: 437 Ohm
Lead Channel Pacing Threshold Amplitude: 0.625 V
Lead Channel Pacing Threshold Amplitude: 1 V
Lead Channel Pacing Threshold Pulse Width: 0.4 ms
Lead Channel Pacing Threshold Pulse Width: 0.4 ms
Lead Channel Sensing Intrinsic Amplitude: 1.875 mV
Lead Channel Sensing Intrinsic Amplitude: 1.875 mV
Lead Channel Sensing Intrinsic Amplitude: 13.5 mV
Lead Channel Sensing Intrinsic Amplitude: 13.5 mV
Lead Channel Setting Pacing Amplitude: 2.25 V
Lead Channel Setting Pacing Amplitude: 2.5 V
Lead Channel Setting Pacing Pulse Width: 0.4 ms
Lead Channel Setting Sensing Sensitivity: 2.8 mV

## 2021-01-15 DIAGNOSIS — I480 Paroxysmal atrial fibrillation: Secondary | ICD-10-CM | POA: Insufficient documentation

## 2021-01-17 NOTE — Progress Notes (Signed)
Remote pacemaker transmission.   

## 2021-01-31 NOTE — Progress Notes (Signed)
fatig   Cardiology Office Note    Date:  02/06/2021   ID:  Marshia, Tropea 30-Mar-1941, MRN [id number]  PCP:  [name], MD  Cardiologist:  Dashaun Onstott [place], MD  Electrophysiologist:  Lewayne Bunting, MD   Chief Complaint: f/u SOB  History of Present Illness:   [name] is a 80 y.o. female with history of HTN, sinus node dysfunction s/p MDT PPM (gen change 2017), migraines, DM, fibromyalgia, HLD, CVA, TIAs, hiatal hernia, nonobstructive CAD, PAF, RLS, chronic edema who presents for follow-up.  She has prior history of evalulation for SOB and chest pain. Cardiac workup was previously unrevealing. Cath 04/2015 showed 10% prox RCA and 25% prox LAD with normal LVEF and normal LVEDP. She then saw Dr. Sherene Sires in 2020 for evaluation of SOB and nocturnal wheezing which he felt were out of proportion to objective pulmonary findings, possibly related to deconditioning and potentially anxiety/depression. Labs were unrevealing including normal d-dimer and BNP.  Coronary CT 04/2019 which showed showed calcium score of 34 (42nd percentile), mild nonobstructive CAD (0-25% RCA, 25-39% prox-mid LAD), small hiatal hernia. Device interrogation was unrevealing at that time.  Echo 05/08/19 showed EF 60-65%, normal diastolic function despite mild LA dilation, mildly increased LV wall thickness, normal RV, mild LAE/RAE. Her subsequent  device interrogation detected new onset atrial fibrillation so Dr. Ladona Ridgel stopped her Plavix and started her on Eliquis. He also switched her HCTZ to Lasix for possible component of diastolic congestion.  She did undergo a CPX in November and this was normal for age with hypertensive BP response and suggestion of diastolic dysfunction with exercise. BP control recommended.   She is seen back for follow-up feeling stable. She does have chronic fatigue. She reports her breathing is OK. Ankle swelling has improved on lasix. She is walking regularly on her treadmill  and takes care of her 28 yo husband who is chronically ill. Denies any palpitations, dizziness, chest pain.    Past Medical History:  Diagnosis Date  . Allergy   . Arthritis   . CAD (coronary artery disease)    a. mild by prior cath; coronary CT 04/2019.  Marland Kitchen Cataract   . Chronic edema   . Complicated migraine   . Diverticulosis   . DM (diabetes mellitus) (HCC)   . Essential hypertension 08/11/2014  . Fibromyalgia   . GERD (gastroesophageal reflux disease)   . History of Serotonin syndrome w/ recurrent residual headaches 08/10/2014  . Hyperlipidemia   . Hypertension   . Internal and external hemorrhoids without complication   . Migraine with visual aura   . Myocardial infarction (HCC)   . PAF (paroxysmal atrial fibrillation) (HCC)   . Potassium deficiency   . 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/09/2016  . TIA (transient ischemic attack) 11/15/2015  . 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    Past Surgical History:  Procedure Laterality Date  . ABDOMINAL HYSTERECTOMY    . APPENDECTOMY    . bil carple tunnel surgery    . CARDIAC CATHETERIZATION N/A 04/09/2015   Procedure: Left Heart Cath and Coronary Angiography;  Surgeon: Jarius Dieudonne M [place], MD;  Location: [facility] INVASIVE CV LAB;  Service: Cardiovascular;  Laterality: N/A;  . COLONOSCOPY  multiple  . EP IMPLANTABLE DEVICE N/A 04/09/2016   Procedure: PPM Generator Changeout;  Surgeon: Marinus Maw, MD;  Location: [facility] INVASIVE CV LAB;  Service: Cardiovascular;  Laterality: N/A;  . ESOPHAGEAL MANOMETRY N/A 08/02/2017   Procedure: ESOPHAGEAL MANOMETRY (EM);  Surgeon: Napoleon Form, MD;  Location: [facility];  Service: Endoscopy;  Laterality: N/A;  . KNEE SURGERY     right x 2  . NECK SURGERY     x2  . PACEMAKER INSERTION    . PH IMPEDANCE STUDY N/A 08/02/2017   Procedure: PH IMPEDANCE STUDY;  Surgeon: Napoleon Form, MD;  Location: [facility] ENDOSCOPY;  Service: Endoscopy;  Laterality: N/A;  . UPPER  GASTROINTESTINAL ENDOSCOPY      Current Medications: Current Meds  Medication Sig  . amLODipine (NORVASC) 2.5 MG tablet Take 1 tablet (2.5 mg total) by mouth daily.  Marland Kitchen apixaban (ELIQUIS) 5 MG TABS tablet Take 1 tablet (5 mg total) by mouth 2 (two) times daily.  Marland Kitchen atorvastatin (LIPITOR) 80 MG tablet Take 1 tablet (80 mg total) by mouth every evening.  . furosemide (LASIX) 20 MG tablet Take 1 tablet (20 mg total) by mouth daily.  Marland Kitchen gabapentin (NEURONTIN) 300 MG capsule Take 300 mg by mouth at bedtime.  Marland Kitchen glipiZIDE (GLUCOTROL) 5 MG tablet Take by mouth 2 (two) times daily before a meal.  . glucose blood (ONETOUCH ULTRA) test strip 2 (two) times daily.  . pantoprazole (PROTONIX) 40 MG tablet Take 1 tablet (40 mg total) by mouth 2 (two) times daily before a meal.  . potassium chloride SA (KLOR-CON) 20 MEQ tablet Take 1 tablet (20 mEq total) by mouth daily.      Allergies:   Citalopram, Codeine, Cymbalta [duloxetine hcl], Metformin and related, Paroxetine hcl, Trazodone and nefazodone, and Caffeine   Social History   Socioeconomic History  . Marital status: Married    Spouse name: Fayrene Fearing  . Number of children: 2  . Years of education: 35  . Highest education level: Not on file  Occupational History  . Occupation: Retired    Associate Professor: RETIRED  Tobacco Use  . Smoking status: Never Smoker  . Smokeless tobacco: Never Used  Vaping Use  . Vaping Use: Never used  Substance and Sexual Activity  . Alcohol use: No    Alcohol/week: 0.0 standard drinks  . Drug use: No  . Sexual activity: Not Currently  Other Topics Concern  . Not on file  Social History Narrative   Patient is married with 2 children. She lives at home with her husband.    Patient is right handed.   Patient has 12 th grade education.   Patient drinks occasional caffeine.   Social Determinants of Health   Financial Resource Strain: Not on file  Food Insecurity: Not on file  Transportation Needs: Not on file  Physical  Activity: Not on file  Stress: Not on file  Social Connections: Not on file     Family History:  The patient's family history includes Breast cancer in her maternal aunt; Colon cancer in her brother, father, and sister; Colon polyps in her sister; Coronary artery disease in her father; Crohn's disease in her sister; Dementia in her daughter and mother; Diabetes in her cousin, daughter, father, maternal aunt, and maternal uncle; Heart attack in her brother, daughter, and father; Irritable bowel syndrome in her sister; Kidney disease in her daughter; Lung cancer in her father and mother; Prostate cancer in her father. There is no history of Hypertension, Esophageal cancer, Rectal cancer, or Stomach cancer.  ROS:   Please see the history of present illness. Otherwise, review of systems is positive for nocturnal wheezing sometimes All other systems are reviewed and otherwise negative.    EKGs/Labs/Other Studies Reviewed:  Studies reviewed are outlined and summarized above. Reports included below if pertinent.  2D Echo 05/2019  1. The left ventricle has normal systolic function with an ejection  fraction of 60-65%. The cavity size was normal. There is mildly increased  left ventricular wall thickness. Mitral medial e' is 8 cm/s, suggesting  normal diastolic function despite mild  LA dilation.  2. The right ventricle has normal systolic function. The cavity was  normal. There is no increase in right ventricular wall thickness. Right  ventricular systolic pressure is normal with an estimated pressure of 28.4  mmHg.  3. Left atrial size was mildly dilated.  4. Right atrial size was mildly dilated  Cor CT 04/2019 FINDINGS: A 120 kV prospective scan was triggered in the descending thoracic aorta at 111 HU's. Axial non-contrast 3 mm slices were carried out through the heart. The data set was analyzed on a dedicated work station and scored using the Agatson method. Gantry rotation  speed was 250 msecs and collimation was .6 mm. No beta blockade and 0.8 mg of sl NTG was given. The 3D data set was reconstructed in 5% intervals of the 67-82 % of the R-R cycle. Diastolic phases were analyzed on a dedicated work station using MPR, MIP and VRT modes. The patient received 80 cc of contrast.  Aorta: Normal size. Minimal diffuse atherosclerotic plaque and calcifications. No dissection.  Aortic Valve:  Trileaflet.  No calcifications.  Coronary Arteries:  Normal coronary origin.  Right dominance.  RCA is a large dominant artery that gives rise to PDA and PLVB. There is minimal diffuse calcified plaque with stenosis 0-25%.  Left main is a short artery that gives rise to LAD and LCX arteries. Left main has no plaque.  LAD is a large vessel that gives rise to two small diagonal arteries. There is mild calcified plaque in the proximal and mid LAD with stenosis 25-49%.  LCX is a non-dominant artery that gives rise to one large OM1 branch. There is no plaque.  Other findings:  Normal pulmonary vein drainage into the left atrium.  A large left atrial appendage without an evidence for a thrombus.  Normal size of the pulmonary artery.  IMPRESSION: 1. Coronary calcium score of 34. This was 5442 percentile for age and sex matched control.  2. Normal coronary origin with right dominance.  3. Mild non-obstructive CAD, aggressive risk factor modification is recommended.  4. Pacemaker leads are seen in the right atrium and right ventricle.   Electronically Signed   By: Tobias AlexanderKatarina  Nelson   On: 04/10/2019 12:45  IMPRESSION: Small hiatal hernia.  No acute extra cardiac abnormality.  Electronically Signed: By: Charlett NoseKevin  Dover M.D. On: 04/10/2019 12:33    CPX 09/12/20: Cardiopulmonary exercise test Order: [id number]  Status: Final result   Visible to patient: Yes (seen)   Next appt: 02/06/2021 at 11:00 AM in Cardiology (Secily Walthour [place], MD)   Dx:  Chronic shortness of breath   3 Result Notes      Narrative  Referred for: dyspnea   Procedure: This patient underwent staged symptom-limited exercise treadmill testing using a modified Naughton protocol with expired gas analysis metabolic evaluation during exercise.   Demographics   Age: 87 Ht. (in.) 66 Wt. (lb) 161.2 BMI: 26.0   Predicted Peak VO2: 17.1   Gender: Female Ht (cm) 167.6 Wt. (kg) 73.1    Results   Pre-Exercise PFTs   FVC 2.18 (77%)     FEV1 1.70 (79%)      FEV1/FVC  78 (100%)      MVV 73 (85%)      Exercise Time:  9:30  Speed (mph): 2.0    Grade (%): 10.5     RPE: 18   Reason stopped: dyspnea (8/10)   Additional symptoms: lightheaded (7/10)   Resting HR: 63 Standing HR: 63 Peak HR: 137  (96% age predicted max HR)   BP rest: 148/74 Standing BP: 130/68 BP peak: 204/58   Peak VO2: 16.4 (96% predicted peak VO2)   VE/VCO2 slope: 34   OUES: 1.35   Peak RER: 1.11   Ventilatory Threshold: 13.1 (77% predicted or measured peak VO2)   Peak RR 34   Peak Ventilation: 49.7   VE/MVV: 68%   PETCO2 at peak: 32   O2pulse: 8  (89% predicted O2pulse)    Interpretation   Notes: Patient gave a very good effort. Pulse-oximetry remained 96-99% for the duration of exercise.   ECG: Resting ECG in normal sinus rhythm with atrial pacing. HR response appropriate. There were no sustained arrhythmias or ST-T changes. BP response hypertensive at peak.   PFT: Pre-exercise spirometry was within mostly normal limits. The MVV was normal.    CPX: Exercise testing with gas exchange demonstrates a normal peak VO2 of 16.4 ml/kg/min (96% of the age/gender/weight matched sedentary norms). The RER of 1.11 indicates a maximal effort. When adjusted to the patient's ideal body weight of 145.9 lb (66.2 kg) the peak VO2 is 18.1 ml/kg (ibw)/min (102% of the ibw-adjusted predicted). The VE/VCO2 slope is mildly elevated and indicates mildly  increased dead space ventilation. The oxygen uptake efficiency slope (OUES) is normal. The VO2 at the ventilatory threshold was normal at 77% of the predicted peak VO2. At peak exercise, the ventilation reached 68% of the measured MVV indicating ventilatory reserve remained. The O2pulse (a surrogate for stroke volume) increased initially, but remained flat for the majority of incremental intensity exercise, at a peak of 75ml/beat (89% predicted).    Conclusion: Exercise testing with gas exchange demonstrates normal functional capacity when compared to matched sedentary norms. There is no clear cardiopulmonary limitation. However, there was a hypertensive response with early flattening of O2 pulse.    Test, report and preliminary impression by:  Lesia Hausen, MS, ACSM-RCEP  09/12/2020 4:14 PM   Attending: Overall normal function capacity for age and gender. Resting spirometry reveals mild lung restriction and may be related to her body habitus. There is a normal HR response to exercise. There is a hypertensive response to exercise with mildly increased VE/VCO2 slope which likely suggests diastolic dysfunction and elevated filling pressures during exercise. Would recommend improved BP control, weight loss and routine exercise training.   Arvilla Meres, MD  3:26 PM         EKG:  EKG is not ordered today  Recent Labs: 08/09/2020: BUN 17; Creatinine, Ser 0.91; Hemoglobin 13.8; Platelets 221; Potassium 3.7; Sodium 144; TSH 3.550  Recent Lipid Panel    Component Value Date/Time   CHOL 161 05/11/2016 0638   CHOL 158 11/15/2015 1415   TRIG 134 05/11/2016 0638   HDL 50 05/11/2016 0638   HDL 50 11/15/2015 1415   CHOLHDL 3.2 05/11/2016 0638   VLDL 27 05/11/2016 0638   LDLCALC 84 05/11/2016 0638   LDLCALC 82 11/15/2015 1415   Dated 01/15/21: A1c 6.5%. glucose 121.  CMET and CBC normal. B12 normal, TSH normal.    PHYSICAL EXAM:    VS:  BP 124/68 (BP Location: Left Arm, Patient Position:  Sitting)  Pulse 82   Ht 5\' 6"  (1.676 m)   Wt 162 lb 3.2 oz (73.6 kg)   SpO2 97%   BMI 26.18 kg/m   BMI: Body mass index is 26.18 kg/m.  GEN: Well nourished, well developed WF, in no acute distress HEENT: normocephalic, atraumatic Neck: no JVD, carotid bruits, or masses Cardiac: RRR; no murmurs, rubs, or gallops, no edema (bilateral compression hose in place) Respiratory:  clear to auscultation bilaterally, normal work of breathing GI: soft, nontender, nondistended, + BS MS: no deformity or atrophy Skin: warm and dry, no rash Neuro:  Alert and Oriented x 3, Strength and sensation are intact, follows commands Psych: euthymic mood, full affect  Wt Readings from Last 3 Encounters:  02/06/21 162 lb 3.2 oz (73.6 kg)  08/09/20 160 lb 6.4 oz (72.8 kg)  07/17/20 162 lb (73.5 kg)     ASSESSMENT & PLAN:   1. Paroxysmal atrial fibrillation - maintaining NSR on exam today. Afib burden very low on pacemaker follow up <0.1%. She is tolerating Eliquis well. CHADS2VA2SC score is 8.  2. Chronic shortness of breath - extensive cardiac and pulmonary evaluation as noted. CPX very reassuring. Continue to focus on exercise and weight control. On lasix.   3. Mild CAD - no anginal symptoms.  Not on BB due to h/o sinus node dysfunction. Not on ASA due to concomitant Eliquis.  4. Essential HTN - controlled on present regimen.  5. Chronic fatigue and restless leg syndrome - no change.  Disposition: F/u with me in one year  Medication Adjustments/Labs and Tests Ordered: Current medicines are reviewed at length with the patient today.  Concerns regarding medicines are outlined above. Medication changes, Labs and Tests ordered today are summarized above and listed in the Patient Instructions accessible in Encounters.   Signed, Malaiyah Achorn 07/19/20, MD  02/06/2021 10:52 AM

## 2021-02-06 ENCOUNTER — Other Ambulatory Visit: Payer: Self-pay

## 2021-02-06 ENCOUNTER — Encounter: Payer: Self-pay | Admitting: Cardiology

## 2021-02-06 ENCOUNTER — Ambulatory Visit (INDEPENDENT_AMBULATORY_CARE_PROVIDER_SITE_OTHER): Payer: Medicare Other | Admitting: Cardiology

## 2021-02-06 VITALS — BP 124/68 | HR 82 | Ht 66.0 in | Wt 162.2 lb

## 2021-02-06 DIAGNOSIS — I480 Paroxysmal atrial fibrillation: Secondary | ICD-10-CM | POA: Diagnosis not present

## 2021-02-06 DIAGNOSIS — I10 Essential (primary) hypertension: Secondary | ICD-10-CM

## 2021-02-06 DIAGNOSIS — R0602 Shortness of breath: Secondary | ICD-10-CM

## 2021-02-06 DIAGNOSIS — Z950 Presence of cardiac pacemaker: Secondary | ICD-10-CM

## 2021-02-06 DIAGNOSIS — R5382 Chronic fatigue, unspecified: Secondary | ICD-10-CM

## 2021-02-06 DIAGNOSIS — I495 Sick sinus syndrome: Secondary | ICD-10-CM

## 2021-03-19 ENCOUNTER — Other Ambulatory Visit: Payer: Self-pay | Admitting: Family Medicine

## 2021-03-19 DIAGNOSIS — Z1231 Encounter for screening mammogram for malignant neoplasm of breast: Secondary | ICD-10-CM

## 2021-04-10 ENCOUNTER — Ambulatory Visit (INDEPENDENT_AMBULATORY_CARE_PROVIDER_SITE_OTHER): Payer: Medicare Other

## 2021-04-10 DIAGNOSIS — I495 Sick sinus syndrome: Secondary | ICD-10-CM

## 2021-04-10 LAB — CUP PACEART REMOTE DEVICE CHECK
Battery Remaining Longevity: 54 mo
Battery Voltage: 2.99 V
Brady Statistic AP VP Percent: 0.1 %
Brady Statistic AP VS Percent: 98.45 %
Brady Statistic AS VP Percent: 0.12 %
Brady Statistic AS VS Percent: 1.32 %
Brady Statistic RA Percent Paced: 98.33 %
Brady Statistic RV Percent Paced: 0.24 %
Date Time Interrogation Session: 20220609164102
Implantable Lead Implant Date: 20070910
Implantable Lead Implant Date: 20070910
Implantable Lead Location: 753859
Implantable Lead Location: 753860
Implantable Lead Model: 5076
Implantable Lead Model: 5076
Implantable Pulse Generator Implant Date: 20170608
Lead Channel Impedance Value: 285 Ohm
Lead Channel Impedance Value: 399 Ohm
Lead Channel Impedance Value: 418 Ohm
Lead Channel Impedance Value: 456 Ohm
Lead Channel Pacing Threshold Amplitude: 0.75 V
Lead Channel Pacing Threshold Amplitude: 0.875 V
Lead Channel Pacing Threshold Pulse Width: 0.4 ms
Lead Channel Pacing Threshold Pulse Width: 0.4 ms
Lead Channel Sensing Intrinsic Amplitude: 1.25 mV
Lead Channel Sensing Intrinsic Amplitude: 1.25 mV
Lead Channel Sensing Intrinsic Amplitude: 12.625 mV
Lead Channel Sensing Intrinsic Amplitude: 12.625 mV
Lead Channel Setting Pacing Amplitude: 2 V
Lead Channel Setting Pacing Amplitude: 2.5 V
Lead Channel Setting Pacing Pulse Width: 0.4 ms
Lead Channel Setting Sensing Sensitivity: 2.8 mV

## 2021-05-02 NOTE — Progress Notes (Signed)
Remote pacemaker transmission.   

## 2021-05-19 ENCOUNTER — Ambulatory Visit
Admission: RE | Admit: 2021-05-19 | Discharge: 2021-05-19 | Disposition: A | Discharge status: Home / Self Care | Payer: Medicare Other | Source: Ambulatory Visit | Attending: Family Medicine | Admitting: Family Medicine

## 2021-05-19 ENCOUNTER — Other Ambulatory Visit: Payer: Self-pay

## 2021-05-19 DIAGNOSIS — Z1231 Encounter for screening mammogram for malignant neoplasm of breast: Secondary | ICD-10-CM

## 2021-06-29 ENCOUNTER — Other Ambulatory Visit: Payer: Self-pay | Admitting: Internal Medicine

## 2021-07-10 ENCOUNTER — Ambulatory Visit (INDEPENDENT_AMBULATORY_CARE_PROVIDER_SITE_OTHER): Payer: Medicare Other

## 2021-07-10 DIAGNOSIS — I495 Sick sinus syndrome: Secondary | ICD-10-CM | POA: Diagnosis not present

## 2021-07-10 LAB — CUP PACEART REMOTE DEVICE CHECK
Battery Remaining Longevity: 55 mo
Battery Voltage: 2.99 V
Brady Statistic AP VP Percent: 0.1 %
Brady Statistic AP VS Percent: 98.68 %
Brady Statistic AS VP Percent: 0 %
Brady Statistic AS VS Percent: 1.21 %
Brady Statistic RA Percent Paced: 98.24 %
Brady Statistic RV Percent Paced: 0.11 %
Date Time Interrogation Session: 20220908103737
Implantable Lead Implant Date: 20070910
Implantable Lead Implant Date: 20070910
Implantable Lead Location: 753859
Implantable Lead Location: 753860
Implantable Lead Model: 5076
Implantable Lead Model: 5076
Implantable Pulse Generator Implant Date: 20170608
Lead Channel Impedance Value: 304 Ohm
Lead Channel Impedance Value: 399 Ohm
Lead Channel Impedance Value: 399 Ohm
Lead Channel Impedance Value: 437 Ohm
Lead Channel Pacing Threshold Amplitude: 0.75 V
Lead Channel Pacing Threshold Amplitude: 1 V
Lead Channel Pacing Threshold Pulse Width: 0.4 ms
Lead Channel Pacing Threshold Pulse Width: 0.4 ms
Lead Channel Sensing Intrinsic Amplitude: 1.125 mV
Lead Channel Sensing Intrinsic Amplitude: 1.125 mV
Lead Channel Sensing Intrinsic Amplitude: 11.75 mV
Lead Channel Sensing Intrinsic Amplitude: 11.75 mV
Lead Channel Setting Pacing Amplitude: 2 V
Lead Channel Setting Pacing Amplitude: 2.5 V
Lead Channel Setting Pacing Pulse Width: 0.4 ms
Lead Channel Setting Sensing Sensitivity: 2.8 mV

## 2021-07-18 NOTE — Progress Notes (Signed)
Remote pacemaker transmission.   

## 2021-07-23 ENCOUNTER — Other Ambulatory Visit: Payer: Self-pay

## 2021-07-23 ENCOUNTER — Encounter: Payer: Self-pay | Admitting: Internal Medicine

## 2021-07-23 ENCOUNTER — Ambulatory Visit (INDEPENDENT_AMBULATORY_CARE_PROVIDER_SITE_OTHER): Payer: Medicare Other | Admitting: Internal Medicine

## 2021-07-23 DIAGNOSIS — I495 Sick sinus syndrome: Secondary | ICD-10-CM | POA: Diagnosis not present

## 2021-07-23 NOTE — Progress Notes (Signed)
HPI The patient returns today for followup of HTN, sob, and PPM due to sinus node dysfunction. She denies sodium indiscretion. No chest pain. Her dyspnea has improved. No edema. Her husband has developed some dementia. She denies peripheral edema. Allergies  Allergen Reactions   Citalopram     Other reaction(s): Serum Sickness (ALLERGY) Serotonin Syndrome   Codeine Other (See Comments)    hallucinations   Cymbalta [Duloxetine Hcl] Other (See Comments)    Serotonin Syndrome   Metformin And Related     Causes dehydration and causes me to go to the hospital   Paroxetine Hcl     Serotonin Syndrome   Trazodone And Nefazodone Other (See Comments)    Serotonin Syndrome   Caffeine Anxiety    Nervousness      Current Outpatient Medications  Medication Sig Dispense Refill   amLODipine (NORVASC) 2.5 MG tablet Take 1 tablet (2.5 mg total) by mouth daily. 90 tablet 3   apixaban (ELIQUIS) 5 MG TABS tablet Take 1 tablet (5 mg total) by mouth 2 (two) times daily. 60 tablet 11   atorvastatin (LIPITOR) 80 MG tablet Take 1 tablet (80 mg total) by mouth every evening. 30 tablet 1   furosemide (LASIX) 20 MG tablet TAKE 1 TABLET(20 MG) BY MOUTH DAILY 90 tablet 0   gabapentin (NEURONTIN) 300 MG capsule Take 300 mg by mouth at bedtime.     glipiZIDE (GLUCOTROL) 5 MG tablet Take by mouth 2 (two) times daily before a meal.     glucose blood (ONETOUCH ULTRA) test strip 2 (two) times daily.     pantoprazole (PROTONIX) 40 MG tablet Take 1 tablet (40 mg total) by mouth 2 (two) times daily before a meal. 60 tablet 2   potassium chloride SA (KLOR-CON) 20 MEQ tablet Take 1 tablet (20 mEq total) by mouth daily. 90 tablet 3   losartan (COZAAR) 50 MG tablet Take 1 tablet (50 mg total) by mouth daily. 90 tablet 3   No current facility-administered medications for this visit.     Past Medical History:  Diagnosis Date   Allergy    Arthritis    CAD (coronary artery disease)    a. mild by prior cath;  coronary CT 04/2019.   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   Past Surgical History:  Procedure Laterality Date   ABDOMINAL HYSTERECTOMY     APPENDECTOMY     bil carple tunnel surgery     CARDIAC CATHETERIZATION N/A 04/09/2015   Procedure: Left Heart Cath and Coronary Angiography;  Surgeon: Peter M [place], MD;  Location[facility] INVASIVE CV LAB;  Service: Cardiovascular;  Laterality: N/A;   COLONOSCOPY  multiple   EP IMPLANTABLE DEVICE N/A 04/09/2016   Procedure:  PPM Generator Changeout;  Surgeon: Marinus Maw, MD;  Location: [facility] INVASIVE CV LAB;  Service: Cardiovascular;  Laterality: N/A;   ESOPHAGEAL MANOMETRY N/A 08/02/2017   Procedure: ESOPHAGEAL MANOMETRY (EM);  Surgeon: Napoleon Form, MD;  Location: [facility] ENDOSCOPY;  Service: Endoscopy;  Laterality: N/A;   KNEE SURGERY     right x 2   NECK SURGERY     x2   PACEMAKER INSERTION     PH IMPEDANCE STUDY N/A 08/02/2017   Procedure: PH IMPEDANCE STUDY;  Surgeon: Napoleon Form, MD;  Location: [facility] ENDOSCOPY;  Service: Endoscopy;  Laterality: N/A;   UPPER GASTROINTESTINAL ENDOSCOPY       Family History  Problem Relation Age of Onset   Coronary artery disease Father    Prostate cancer Father    Colon cancer Father    Lung cancer Father    Diabetes  Father    Heart attack Father    Diabetes Daughter    Dementia Daughter    Colon cancer Sister        x 2   Lung cancer Mother    Dementia Mother    Colon polyps Sister    Crohn's disease Sister    Diabetes Maternal Aunt        multiple   Breast cancer Maternal Aunt    Colon cancer Brother    Kidney disease Daughter    Diabetes Cousin        multiple   Diabetes Maternal Uncle        multiple   Irritable bowel syndrome Sister    Heart attack Daughter    Heart attack Brother    Hypertension Neg Hx    Esophageal cancer Neg Hx    Rectal cancer Neg Hx    Stomach cancer Neg Hx      Social History   Socioeconomic History   Marital status: Married    Spouse name: james   Number of children: 2   Years of education: 12   Highest education level: Not on file  Occupational History   Occupation: Retired    Associate Professor: RETIRED  Tobacco Use   Smoking status: Never   Smokeless tobacco: Never  Vaping Use   Vaping Use: Never used  Substance and Sexual Activity   Alcohol use: No    Alcohol/week: 0.0 standard drinks   Drug use: No   Sexual activity: Not Currently  Other Topics Concern   Not on file  Social History Narrative   Patient is married with 2 children. She lives at home with her husband.    Patient is right handed.   Patient has 12 th grade education.   Patient drinks occasional caffeine.   Social Determinants of Health   Financial Resource Strain: Not on file  Food Insecurity: Not on file  Transportation Needs: Not on file  Physical Activity: Not on file  Stress: Not on file  Social Connections: Not on file  Intimate Partner Violence: Not on file     There were no vitals taken for this visit.  Physical Exam:  Well appearing NAD HEENT: Unremarkable Neck:  No JVD, no thyromegally Lymphatics:  No adenopathy Back:  No CVA tenderness Lungs:  Clear with no wheezes HEART:  Regular rate rhythm, no murmurs, no rubs, no clicks Abd:  soft, positive bowel  sounds, no organomegally, no rebound, no guarding Ext:  2 plus pulses, no edema, no cyanosis, no clubbing Skin:  No rashes no nodules Neuro:  CN II through  XII intact, motor grossly intact  EKG - nsr with atrial pacing  DEVICE  Normal device function.  See PaceArt for details.   Assess/Plan:   Sinus node dysfunction - she is asymptomatic, s/p PPM insertion.  2.  HTN - her blood pressure is better. She will continue her dose of losartan at 50 mg daily.  3. PPM - her medtronic DDD PM is working normally.  4. Dyspnea - I suspect that this is multifactorial but is improved on lasix. She will maintain a low sodium diet.    [name].D.

## 2021-07-23 NOTE — Patient Instructions (Signed)

## 2021-08-11 ENCOUNTER — Other Ambulatory Visit: Payer: Self-pay | Admitting: Internal Medicine

## 2021-08-11 NOTE — Telephone Encounter (Signed)
Eliquis 5 mg refill request received. Patient is 80 years old, weight-74.4 kg, Crea- 0.85 on 01/15/21, Diagnosis-PAF, and last seen by Dr. Ladona Ridgel on 07/23/21. Dose is appropriate based on dosing criteria. Will send in refill to requested pharmacy.

## 2021-08-21 ENCOUNTER — Other Ambulatory Visit: Payer: Self-pay | Admitting: Physician Assistant

## 2021-09-22 ENCOUNTER — Other Ambulatory Visit: Payer: Self-pay | Admitting: Physician Assistant

## 2021-09-26 ENCOUNTER — Other Ambulatory Visit: Payer: Self-pay | Admitting: Internal Medicine

## 2021-10-09 ENCOUNTER — Ambulatory Visit (INDEPENDENT_AMBULATORY_CARE_PROVIDER_SITE_OTHER): Payer: Medicare Other

## 2021-10-09 DIAGNOSIS — I495 Sick sinus syndrome: Secondary | ICD-10-CM

## 2021-10-09 LAB — CUP PACEART REMOTE DEVICE CHECK
Battery Remaining Longevity: 48 mo
Battery Voltage: 2.98 V
Brady Statistic AP VP Percent: 0.12 %
Brady Statistic AP VS Percent: 99.01 %
Brady Statistic AS VP Percent: 0 %
Brady Statistic AS VS Percent: 0.87 %
Brady Statistic RA Percent Paced: 98.74 %
Brady Statistic RV Percent Paced: 0.14 %
Date Time Interrogation Session: 20221208084402
Implantable Lead Implant Date: 20070910
Implantable Lead Implant Date: 20070910
Implantable Lead Location: 753859
Implantable Lead Location: 753860
Implantable Lead Model: 5076
Implantable Lead Model: 5076
Implantable Pulse Generator Implant Date: 20170608
Lead Channel Impedance Value: 285 Ohm
Lead Channel Impedance Value: 399 Ohm
Lead Channel Impedance Value: 437 Ohm
Lead Channel Impedance Value: 475 Ohm
Lead Channel Pacing Threshold Amplitude: 0.75 V
Lead Channel Pacing Threshold Amplitude: 0.875 V
Lead Channel Pacing Threshold Pulse Width: 0.4 ms
Lead Channel Pacing Threshold Pulse Width: 0.4 ms
Lead Channel Sensing Intrinsic Amplitude: 1.125 mV
Lead Channel Sensing Intrinsic Amplitude: 1.125 mV
Lead Channel Sensing Intrinsic Amplitude: 10.75 mV
Lead Channel Sensing Intrinsic Amplitude: 10.75 mV
Lead Channel Setting Pacing Amplitude: 2 V
Lead Channel Setting Pacing Amplitude: 2.5 V
Lead Channel Setting Pacing Pulse Width: 0.4 ms
Lead Channel Setting Sensing Sensitivity: 2.8 mV

## 2021-10-17 NOTE — Progress Notes (Signed)
Remote pacemaker transmission.   

## 2022-01-08 ENCOUNTER — Ambulatory Visit (INDEPENDENT_AMBULATORY_CARE_PROVIDER_SITE_OTHER): Payer: Medicare Other

## 2022-01-08 DIAGNOSIS — I495 Sick sinus syndrome: Secondary | ICD-10-CM | POA: Diagnosis not present

## 2022-01-08 LAB — CUP PACEART REMOTE DEVICE CHECK
Battery Remaining Longevity: 50 mo
Battery Voltage: 2.98 V
Brady Statistic AP VP Percent: 0.11 %
Brady Statistic AP VS Percent: 98.99 %
Brady Statistic AS VP Percent: 0 %
Brady Statistic AS VS Percent: 0.9 %
Brady Statistic RA Percent Paced: 99.03 %
Brady Statistic RV Percent Paced: 0.12 %
Date Time Interrogation Session: 20230309091726
Implantable Lead Implant Date: 20070910
Implantable Lead Implant Date: 20070910
Implantable Lead Location: 753859
Implantable Lead Location: 753860
Implantable Lead Model: 5076
Implantable Lead Model: 5076
Implantable Pulse Generator Implant Date: 20170608
Lead Channel Impedance Value: 304 Ohm
Lead Channel Impedance Value: 380 Ohm
Lead Channel Impedance Value: 399 Ohm
Lead Channel Impedance Value: 418 Ohm
Lead Channel Pacing Threshold Amplitude: 0.75 V
Lead Channel Pacing Threshold Amplitude: 0.875 V
Lead Channel Pacing Threshold Pulse Width: 0.4 ms
Lead Channel Pacing Threshold Pulse Width: 0.4 ms
Lead Channel Sensing Intrinsic Amplitude: 1.125 mV
Lead Channel Sensing Intrinsic Amplitude: 1.125 mV
Lead Channel Sensing Intrinsic Amplitude: 12.125 mV
Lead Channel Sensing Intrinsic Amplitude: 12.125 mV
Lead Channel Setting Pacing Amplitude: 2 V
Lead Channel Setting Pacing Amplitude: 2.5 V
Lead Channel Setting Pacing Pulse Width: 0.4 ms
Lead Channel Setting Sensing Sensitivity: 2.8 mV

## 2022-01-14 DIAGNOSIS — E11610 Type 2 diabetes mellitus with diabetic neuropathic arthropathy: Secondary | ICD-10-CM | POA: Insufficient documentation

## 2022-01-22 NOTE — Progress Notes (Signed)
Remote pacemaker transmission.   

## 2022-04-09 ENCOUNTER — Ambulatory Visit (INDEPENDENT_AMBULATORY_CARE_PROVIDER_SITE_OTHER): Payer: Medicare Other

## 2022-04-09 DIAGNOSIS — I495 Sick sinus syndrome: Secondary | ICD-10-CM | POA: Diagnosis not present

## 2022-04-09 LAB — CUP PACEART REMOTE DEVICE CHECK
Battery Remaining Longevity: 44 mo
Battery Voltage: 2.98 V
Brady Statistic AP VP Percent: 0.08 %
Brady Statistic AP VS Percent: 99 %
Brady Statistic AS VP Percent: 0.11 %
Brady Statistic AS VS Percent: 0.81 %
Brady Statistic RA Percent Paced: 98.97 %
Brady Statistic RV Percent Paced: 0.2 %
Date Time Interrogation Session: 20230608081502
Implantable Lead Implant Date: 20070910
Implantable Lead Implant Date: 20070910
Implantable Lead Location: 753859
Implantable Lead Location: 753860
Implantable Lead Model: 5076
Implantable Lead Model: 5076
Implantable Pulse Generator Implant Date: 20170608
Lead Channel Impedance Value: 304 Ohm
Lead Channel Impedance Value: 399 Ohm
Lead Channel Impedance Value: 418 Ohm
Lead Channel Impedance Value: 456 Ohm
Lead Channel Pacing Threshold Amplitude: 0.875 V
Lead Channel Pacing Threshold Amplitude: 0.875 V
Lead Channel Pacing Threshold Pulse Width: 0.4 ms
Lead Channel Pacing Threshold Pulse Width: 0.4 ms
Lead Channel Sensing Intrinsic Amplitude: 1.125 mV
Lead Channel Sensing Intrinsic Amplitude: 1.125 mV
Lead Channel Sensing Intrinsic Amplitude: 11.75 mV
Lead Channel Sensing Intrinsic Amplitude: 11.75 mV
Lead Channel Setting Pacing Amplitude: 2 V
Lead Channel Setting Pacing Amplitude: 2.5 V
Lead Channel Setting Pacing Pulse Width: 0.4 ms
Lead Channel Setting Sensing Sensitivity: 2.8 mV

## 2022-04-16 ENCOUNTER — Other Ambulatory Visit: Payer: Self-pay | Admitting: Family Medicine

## 2022-04-16 DIAGNOSIS — Z1231 Encounter for screening mammogram for malignant neoplasm of breast: Secondary | ICD-10-CM

## 2022-04-17 NOTE — Progress Notes (Signed)
Remote pacemaker transmission.   

## 2022-05-01 ENCOUNTER — Telehealth: Payer: Self-pay

## 2022-05-01 NOTE — Telephone Encounter (Signed)
The patient called to make her yearly appointment with Dr. Ladona Ridgel. I told her I will send the scheduler a message and she will call her back on Monday.

## 2022-05-08 ENCOUNTER — Telehealth: Payer: Self-pay | Admitting: Internal Medicine

## 2022-05-08 NOTE — Telephone Encounter (Signed)
Phone message left at Dr. Rene Kocher office requesting a call back for further details: Pt was contacted for symptom update: Pt stated that she has been having abdominal pain, N/V and diarrhea for 2 months now. Pt has taking imodium with little relief: Pt stated that an Xray was done at Dr. Isidore Moos today  which did not show an obstruction:  Pt was scheduled on 05/13/2022 at 10:10 AM for an Office Visit with Dr. Darrick Grinder: Pt made aware Pt verbalized understanding with all questions answered.

## 2022-05-08 NOTE — Telephone Encounter (Signed)
Inbound call from Dr. Willey Blade office requesting an appointment with Dr. Leone Payor for patient. Stated that she is vomiting and also having diarrhea. I advised them that Dr. Leone Payor does not have any openings until September and that we could get her in on 8/1 with an APP and she stated NO that she needed to be seen next week. Please advise.

## 2022-05-13 ENCOUNTER — Encounter: Payer: Self-pay | Admitting: Internal Medicine

## 2022-05-13 ENCOUNTER — Ambulatory Visit (INDEPENDENT_AMBULATORY_CARE_PROVIDER_SITE_OTHER): Payer: Medicare Other | Admitting: Internal Medicine

## 2022-05-13 VITALS — BP 124/62 | HR 63 | Ht 66.0 in | Wt 163.4 lb

## 2022-05-13 DIAGNOSIS — K5641 Fecal impaction: Secondary | ICD-10-CM | POA: Diagnosis not present

## 2022-05-13 DIAGNOSIS — E1143 Type 2 diabetes mellitus with diabetic autonomic (poly)neuropathy: Secondary | ICD-10-CM

## 2022-05-13 DIAGNOSIS — R109 Unspecified abdominal pain: Secondary | ICD-10-CM

## 2022-05-13 DIAGNOSIS — K219 Gastro-esophageal reflux disease without esophagitis: Secondary | ICD-10-CM

## 2022-05-13 NOTE — Patient Instructions (Addendum)
Dr Leone Payor recommends that you complete a bowel purge (to clean out your bowels). Please do the following: Purchase a bottle of Miralax over the counter as well as a box of 5 mg dulcolax tablets. Take 4 dulcolax tablets. Wait 1 hour. You will then drink 6-8 capfuls of Miralax mixed in an adequate amount of water/juice/gatorade (you may choose which of these liquids to drink) over the next 2-3 hours. (1 capful per 8 ounces of liquid) You should expect results within 1 to 6 hours after completing the bowel purge.   Elevated head of bed to help with vomiting.  Call or send mychart message in 1-2 weeks with an update.    I appreciate the opportunity to care for you. Stan Head, MD, [facility]

## 2022-05-13 NOTE — Progress Notes (Unsigned)
[name] 81 y.o. 1940/11/26 [id number]  Assessment & Plan:   Encounter Diagnoses  Name Primary?   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  We are going to try MiraLAX purge and have her let me know how that works.  I do not think other investigation is necessary given clinical scenario and all the information we have that is reassuring.  CC: Ashley Found, MD    Subjective:   Chief Complaint: Diarrhea and abdominal pain  HPI This 81 year old woman presents with a 62-month history of lower abdominal cramps, diarrhea that comes and goes every few days in between days without defecation, associated with urgency and incontinence.  It is dark loose diarrhea she says.  She has had a few episodes of nausea and vomiting also.  She has right flank right upper quadrant and right lower quadrant pain.  She has had these off and on before and had a negative CT for right flank pain in 2019.  She saw primary care on July 7 and was prescribed Pepcid and sucralfate and thinks that is helped a little bit.  Previous colonoscopy 2019 by me was severe sigmoid diverticulosis.  There is a family history of colon cancer but we discontinued screening due to age.   Review of those records shows that a CMET CBC and UA were all okay except for a glucose of 166 and a specific gravity of 1.030 in the urine.  CT scanning was undertaken:  CONCLUSION:   1.  No evidence of obstruction. GI tract: No focal transition point or bowel dilatation. Small bowel-small bowel intussusception (series 2, image 66) without any obstruction likely transient. Fecalization throughout the small bowel. Moderate to large colonic stool burden. Extensive sigmoid colon  diverticula without evidence of diverticulitis. Small sliding-type hiatal hernia. Small duodenal diverticulum. Prior appendectomy.  2.  Moderate to large colonic stool burden with fecalization throughout the small bowel which may reflect delayed transit.  3.  Small sliding-type hiatal hernia. She also had diffuse hepatic steatosis and diffusely atrophic pancreas   She has severe diabetic neuropathy problems and also has to care for her 52 year old husband who has CHF and chronic kidney disease. Allergies  Allergen Reactions   Citalopram     Other reaction(s): Serum Sickness (ALLERGY) Serotonin Syndrome   Codeine Other (See Comments)    hallucinations   Cymbalta [Duloxetine Hcl] Other (See Comments)    Serotonin Syndrome   Metformin And Related     Causes dehydration and causes me to go to the hospital   Paroxetine Hcl     Serotonin Syndrome   Trazodone And Nefazodone Other (See Comments)    Serotonin Syndrome   Caffeine Anxiety    Nervousness    Current Meds  Medication Sig   amLODipine (NORVASC) 2.5 MG tablet TAKE 1 TABLET(2.5 MG) BY MOUTH DAILY   atorvastatin (LIPITOR) 80 MG tablet Take 1 tablet (80 mg total) by mouth every evening.   ELIQUIS 5 MG TABS tablet TAKE 1 TABLET(5 MG) BY MOUTH TWICE DAILY   famotidine (PEPCID) 20 MG tablet Take 20 mg by mouth 2 (two) times daily.   furosemide (LASIX) 20 MG tablet TAKE 1 TABLET(20 MG) BY MOUTH DAILY   glipiZIDE (GLUCOTROL) 5 MG tablet Take  by mouth 2 (two) times daily before a meal.   glucose blood (ONETOUCH ULTRA) test strip 2 (two) times daily.   pantoprazole (PROTONIX) 40 MG tablet Take 1 tablet (40 mg total) by mouth 2 (two) times daily before a meal.   potassium chloride SA (KLOR-CON) 20 MEQ tablet TAKE 1 TABLET(20 MEQ) BY MOUTH DAILY   sucralfate (CARAFATE) 1 g tablet Take 1 g by mouth 4 (four) times daily.   Past Medical History:  Diagnosis Date   Allergy    Arthritis    CAD (coronary artery disease)    a. mild by prior  cath; coronary CT 04/2019.   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/2016   TIA (transient ischemic attack) 11/15/2015   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   Past Surgical History:  Procedure Laterality Date   ABDOMINAL HYSTERECTOMY     APPENDECTOMY     bil carple tunnel surgery     CARDIAC CATHETERIZATION N/A 04/09/2015   Procedure: Left Heart Cath and Coronary Angiography;  Surgeon: Peter M Swaziland, MD;  Location: [facility] INVASIVE CV LAB;  Service: Cardiovascular;  Laterality: N/A;   COLONOSCOPY  multiple   EP IMPLANTABLE DEVICE N/A 04/09/2016   Procedure: PPM Generator Changeout;  Surgeon: Marinus Maw, MD;  Location: [facility] INVASIVE CV LAB;  Service: Cardiovascular;  Laterality: N/A;   ESOPHAGEAL MANOMETRY N/A 08/02/2017   Procedure: ESOPHAGEAL MANOMETRY (EM);  Surgeon: Napoleon Form, MD;  Location: [facility] ENDOSCOPY;  Service: Endoscopy;  Laterality: N/A;   KNEE SURGERY     right x 2   NECK SURGERY     x2   PACEMAKER INSERTION     PH IMPEDANCE STUDY N/A 08/02/2017   Procedure: PH IMPEDANCE STUDY;  Surgeon: Napoleon Form, MD;  Location: [facility] ENDOSCOPY;  Service: Endoscopy;  Laterality: N/A;   UPPER GASTROINTESTINAL ENDOSCOPY     Social History   Social History Narrative   Patient is married with 2 children. She lives at home with her husband.    Patient is right handed.   Patient has 12 th grade education.   Patient drinks occasional caffeine.   family history  includes Breast cancer in her maternal aunt; Colon cancer in her brother, father, and sister; Colon polyps in her sister; Coronary artery disease in her father; Crohn's disease in her sister; Dementia in her daughter and mother; Diabetes in her cousin, daughter, father, maternal aunt, and maternal uncle; Heart attack in her brother, daughter, and father; Irritable bowel syndrome in her sister; Kidney disease in her daughter; Lung cancer in her father and mother; Prostate cancer in her father.   Review of Systems As per HPI Objective:   Physical Exam BP 124/62   Pulse 63   Ht 5\' 6"  (1.676 m)   Wt 163 lb 6.4 oz (74.1 kg)   BMI 26.37 kg/m  Obese NAD Lungs cta Cor NL SS2 no rmg Abd soft obese and  NT but + Carnett's Heather CMA present  Rectal - NL anoderm, w/ Nl resting tone Formed brown stool filling the vault

## 2022-05-20 ENCOUNTER — Ambulatory Visit
Admission: RE | Admit: 2022-05-20 | Discharge: 2022-05-20 | Disposition: A | Discharge status: Home / Self Care | Payer: Medicare Other | Source: Ambulatory Visit | Attending: Family Medicine | Admitting: Family Medicine

## 2022-05-20 DIAGNOSIS — Z1231 Encounter for screening mammogram for malignant neoplasm of breast: Secondary | ICD-10-CM

## 2022-05-27 ENCOUNTER — Encounter: Payer: Self-pay | Admitting: Internal Medicine

## 2022-07-01 ENCOUNTER — Other Ambulatory Visit: Payer: Self-pay

## 2022-07-01 MED ORDER — FUROSEMIDE 20 MG PO TABS: ORAL_TABLET | ORAL | 0 refills | Status: DC

## 2022-07-09 ENCOUNTER — Ambulatory Visit (INDEPENDENT_AMBULATORY_CARE_PROVIDER_SITE_OTHER): Payer: Medicare Other

## 2022-07-09 DIAGNOSIS — I495 Sick sinus syndrome: Secondary | ICD-10-CM | POA: Diagnosis not present

## 2022-07-14 LAB — CUP PACEART REMOTE DEVICE CHECK
Battery Remaining Longevity: 43 mo
Battery Voltage: 2.97 V
Brady Statistic AP VP Percent: 0.13 %
Brady Statistic AP VS Percent: 98.81 %
Brady Statistic AS VP Percent: 0.03 %
Brady Statistic AS VS Percent: 1.04 %
Brady Statistic RA Percent Paced: 98.87 %
Brady Statistic RV Percent Paced: 0.17 %
Date Time Interrogation Session: 20230907091202
Implantable Lead Implant Date: 20070910
Implantable Lead Implant Date: 20070910
Implantable Lead Location: 753859
Implantable Lead Location: 753860
Implantable Lead Model: 5076
Implantable Lead Model: 5076
Implantable Pulse Generator Implant Date: 20170608
Lead Channel Impedance Value: 304 Ohm
Lead Channel Impedance Value: 399 Ohm
Lead Channel Impedance Value: 418 Ohm
Lead Channel Impedance Value: 456 Ohm
Lead Channel Pacing Threshold Amplitude: 0.875 V
Lead Channel Pacing Threshold Amplitude: 0.875 V
Lead Channel Pacing Threshold Pulse Width: 0.4 ms
Lead Channel Pacing Threshold Pulse Width: 0.4 ms
Lead Channel Sensing Intrinsic Amplitude: 13.5 mV
Lead Channel Sensing Intrinsic Amplitude: 13.5 mV
Lead Channel Sensing Intrinsic Amplitude: 2 mV
Lead Channel Sensing Intrinsic Amplitude: 2 mV
Lead Channel Setting Pacing Amplitude: 2 V
Lead Channel Setting Pacing Amplitude: 2.5 V
Lead Channel Setting Pacing Pulse Width: 0.4 ms
Lead Channel Setting Sensing Sensitivity: 2.8 mV

## 2022-07-17 DIAGNOSIS — K8689 Other specified diseases of pancreas: Secondary | ICD-10-CM | POA: Insufficient documentation

## 2022-07-22 ENCOUNTER — Ambulatory Visit: Payer: Medicare Other | Attending: Internal Medicine | Admitting: Internal Medicine

## 2022-07-22 ENCOUNTER — Encounter: Payer: Self-pay | Admitting: Internal Medicine

## 2022-07-22 VITALS — BP 120/66 | HR 82 | Ht 66.0 in | Wt 158.4 lb

## 2022-07-22 DIAGNOSIS — I495 Sick sinus syndrome: Secondary | ICD-10-CM | POA: Insufficient documentation

## 2022-07-22 DIAGNOSIS — I10 Essential (primary) hypertension: Secondary | ICD-10-CM | POA: Insufficient documentation

## 2022-07-22 DIAGNOSIS — Z950 Presence of cardiac pacemaker: Secondary | ICD-10-CM | POA: Insufficient documentation

## 2022-07-22 LAB — CUP PACEART INCLINIC DEVICE CHECK
Battery Remaining Longevity: 43 mo
Battery Voltage: 2.97 V
Brady Statistic AP VP Percent: 0.11 %
Brady Statistic AP VS Percent: 98.92 %
Brady Statistic AS VP Percent: 0.04 %
Brady Statistic AS VS Percent: 0.93 %
Brady Statistic RA Percent Paced: 98.88 %
Brady Statistic RV Percent Paced: 0.16 %
Date Time Interrogation Session: 20230920164347
Implantable Lead Implant Date: 20070910
Implantable Lead Implant Date: 20070910
Implantable Lead Location: 753859
Implantable Lead Location: 753860
Implantable Lead Model: 5076
Implantable Lead Model: 5076
Implantable Pulse Generator Implant Date: 20170608
Lead Channel Impedance Value: 342 Ohm
Lead Channel Impedance Value: 437 Ohm
Lead Channel Impedance Value: 456 Ohm
Lead Channel Impedance Value: 475 Ohm
Lead Channel Pacing Threshold Amplitude: 0.875 V
Lead Channel Pacing Threshold Amplitude: 0.875 V
Lead Channel Pacing Threshold Pulse Width: 0.4 ms
Lead Channel Pacing Threshold Pulse Width: 0.4 ms
Lead Channel Sensing Intrinsic Amplitude: 1.375 mV
Lead Channel Sensing Intrinsic Amplitude: 1.75 mV
Lead Channel Sensing Intrinsic Amplitude: 12.75 mV
Lead Channel Sensing Intrinsic Amplitude: 17.875 mV
Lead Channel Setting Pacing Amplitude: 2 V
Lead Channel Setting Pacing Amplitude: 2.5 V
Lead Channel Setting Pacing Pulse Width: 0.4 ms
Lead Channel Setting Sensing Sensitivity: 2.8 mV

## 2022-07-22 NOTE — Patient Instructions (Signed)
Medication Instructions:  Your physician recommends that you continue on your current medications as directed. Please refer to the Current Medication list given to you today.  *If you need a refill on your cardiac medications before your next appointment, please call your pharmacy*  Lab Work: None ordered.  If you have labs (blood work) drawn today and your tests are completely normal, you will receive your results only by: [place] (if you have MyChart) OR A paper copy in the mail If you have any lab test that is abnormal or we need to change your treatment, we will call you to review the results.  Testing/Procedures: None ordered.  Follow-Up: At [facility], you and your health needs are our priority.  As part of our continuing mission to provide you with exceptional heart care, we have created designated Provider Care Teams.  These Care Teams include your primary Cardiologist (physician) and Advanced Practice Providers (APPs -  Physician Assistants and Nurse Practitioners) who all work together to provide you with the care you need, when you need it.  We recommend signing up for the patient portal called "MyChart".  Sign up information is provided on this After Visit Summary.  MyChart is used to connect with patients for Virtual Visits (Telemedicine).  Patients are able to view lab/test results, encounter notes, upcoming appointments, etc.  Non-urgent messages can be sent to your provider as well.   To learn more about what you can do with MyChart, go to [email or web address].    Your next appointment:   1 year(s)  The format for your next appointment:   In Person  Provider:   Cristopher Peru, MD{or one of the following Advanced Practice Providers on your designated Care Team:   Tommye Standard, [place] Legrand Como "Jonni Sanger" Chalmers Cater, [place]  Remote monitoring is used to monitor your Pacemaker from home. This monitoring reduces the number of office visits required to check your device to  one time per year. It allows [place] to keep an eye on the functioning of your device to ensure it is working properly. You are scheduled for a device check from home on 10/08/22. You may send your transmission at any time that day. If you have a wireless device, the transmission will be sent automatically. After your physician reviews your transmission, you will receive a postcard with your next transmission date.  Important Information About Sugar

## 2022-07-22 NOTE — Progress Notes (Signed)
HPI The patient returns today for followup of HTN, sob, and PPM due to sinus node dysfunction. She denies sodium indiscretion. No chest pain. Her dyspnea has improved. No edema. Her husband has developed some dementia. She denies peripheral edema. Allergies  Allergen Reactions   Citalopram     Other reaction(s): Serum Sickness (ALLERGY) Serotonin Syndrome   Codeine Other (See Comments)    hallucinations   Cymbalta [Duloxetine Hcl] Other (See Comments)    Serotonin Syndrome   Metformin And Related     Causes dehydration and causes me to go to the hospital   Paroxetine Hcl     Serotonin Syndrome   Trazodone And Nefazodone Other (See Comments)    Serotonin Syndrome   Caffeine Anxiety    Nervousness      Current Outpatient Medications  Medication Sig Dispense Refill   amLODipine (NORVASC) 2.5 MG tablet TAKE 1 TABLET(2.5 MG) BY MOUTH DAILY 90 tablet 3   atorvastatin (LIPITOR) 80 MG tablet Take 1 tablet (80 mg total) by mouth every evening. 30 tablet 1   ELIQUIS 5 MG TABS tablet TAKE 1 TABLET(5 MG) BY MOUTH TWICE DAILY 60 tablet 11   furosemide (LASIX) 20 MG tablet TAKE 1 TABLET(20 MG) BY MOUTH DAILY 90 tablet 0   glipiZIDE (GLUCOTROL) 5 MG tablet Take by mouth 2 (two) times daily before a meal.     glucose blood (ONETOUCH ULTRA) test strip 2 (two) times daily.     pantoprazole (PROTONIX) 40 MG tablet Take 1 tablet (40 mg total) by mouth 2 (two) times daily before a meal. 60 tablet 2   potassium chloride SA (KLOR-CON) 20 MEQ tablet TAKE 1 TABLET(20 MEQ) BY MOUTH DAILY 90 tablet 3   losartan (COZAAR) 50 MG tablet Take 1 tablet (50 mg total) by mouth daily. 90 tablet 3   No current facility-administered medications for this visit.     Past Medical History:  Diagnosis Date   Allergy    Arthritis    CAD (coronary artery disease)    a. mild by prior cath; coronary CT 04/2019.   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   Past Surgical History:  Procedure Laterality Date   ABDOMINAL HYSTERECTOMY     APPENDECTOMY     bil carple tunnel surgery     CARDIAC CATHETERIZATION N/A 04/09/2015   Procedure: Left Heart Cath and Coronary Angiography;  Surgeon: Peter M Martinique, MD;  Location: [facility] CV LAB;  Service: Cardiovascular;  Laterality: N/A;   COLONOSCOPY  multiple   EP IMPLANTABLE DEVICE N/A 04/09/2016   Procedure: PPM Generator Changeout;  Surgeon: Evans Lance, MD;  Location: [place] CV LAB;  Service: Cardiovascular;  Laterality: N/A;   ESOPHAGEAL MANOMETRY  N/A 08/02/2017   Procedure: ESOPHAGEAL MANOMETRY (EM);  Surgeon: Napoleon Form, MD;  Location: [facility];  Service: Endoscopy;  Laterality: N/A;   KNEE SURGERY     right x 2   NECK SURGERY     x2   PACEMAKER INSERTION     PH IMPEDANCE STUDY N/A 08/02/2017   Procedure: PH IMPEDANCE STUDY;  Surgeon: Napoleon Form, MD;  Location: [facility];  Service: Endoscopy;  Laterality: N/A;   UPPER GASTROINTESTINAL ENDOSCOPY       Family History  Problem Relation Age of Onset   Coronary artery disease Father    Prostate cancer Father    Colon cancer Father    Lung cancer Father    Diabetes Father    Heart attack Father    Diabetes Daughter    Dementia Daughter    Colon cancer Sister        x 2   Lung  cancer Mother    Dementia Mother    Colon polyps Sister    Crohn's disease Sister    Diabetes Maternal Aunt        multiple   Breast cancer Maternal Aunt    Colon cancer Brother    Kidney disease Daughter    Diabetes Cousin        multiple   Diabetes Maternal Uncle        multiple   Irritable bowel syndrome Sister    Heart attack Daughter    Heart attack Brother    Hypertension Neg Hx    Esophageal cancer Neg Hx    Rectal cancer Neg Hx    Stomach cancer Neg Hx      Social History   Socioeconomic History   Marital status: Married    Spouse name: james   Number of children: 2   Years of education: 12   Highest education level: Not on file  Occupational History   Occupation: Retired    Associate Professor: RETIRED  Tobacco Use   Smoking status: Never   Smokeless tobacco: Never  Vaping Use   Vaping Use: Never used  Substance and Sexual Activity   Alcohol use: No    Alcohol/week: 0.0 standard drinks of alcohol   Drug use: No   Sexual activity: Not Currently  Other Topics Concern   Not on file  Social History Narrative   Patient is married with 2 children. She lives at home with her husband.    Patient is right handed.   Patient has 12 th grade education.   Patient drinks occasional caffeine.   Social Determinants of Health   Financial Resource Strain: Not on file  Food Insecurity: Not on file  Transportation Needs: Not on file  Physical Activity: Not on file  Stress: Not on file  Social Connections: Not on file  Intimate Partner Violence: Not on file     BP 120/66   Pulse 82   Ht 5\' 6"  (1.676 m)   Wt 158 lb 6.4 oz (71.8 kg)   SpO2 98%   BMI 25.57 kg/m   Physical Exam:  Well appearing NAD HEENT: Unremarkable Neck:  No JVD, no thyromegally Lymphatics:  No adenopathy Back:  No CVA tenderness Lungs:  Clear HEART:  Regular rate rhythm, no murmurs, no rubs, no clicks Abd:  soft, positive bowel sounds, no organomegally, no rebound, no guarding Ext:  2 plus  pulses, no edema, no cyanosis, no clubbing Skin:  No rashes no nodules Neuro:  CN II through XII intact,  motor grossly intact  EKG - nsr with atrial pacing  DEVICE  Normal device function.  See PaceArt for details.   Assess/Plan:    Sinus node dysfunction - she is asymptomatic, s/p PPM insertion. She has no sinus beats at 35/min today 2.  HTN - her blood pressure is better. She will continue her dose of losartan at 50 mg daily.  3. PPM - her medtronic DDD PM is working normally.  4. Dyspnea - I suspect that this is multifactorial but is improved on lasix. She will maintain a low sodium diet.    Leonia Reeves.D.

## 2022-07-25 NOTE — Progress Notes (Signed)
Remote pacemaker transmission.   

## 2022-07-30 ENCOUNTER — Ambulatory Visit (INDEPENDENT_AMBULATORY_CARE_PROVIDER_SITE_OTHER): Payer: Medicare Other | Admitting: Podiatry

## 2022-07-30 ENCOUNTER — Ambulatory Visit (INDEPENDENT_AMBULATORY_CARE_PROVIDER_SITE_OTHER): Payer: Medicare Other

## 2022-07-30 ENCOUNTER — Ambulatory Visit: Payer: Medicare Other

## 2022-07-30 DIAGNOSIS — M14672 Charcot's joint, left ankle and foot: Secondary | ICD-10-CM | POA: Diagnosis not present

## 2022-07-30 DIAGNOSIS — M7742 Metatarsalgia, left foot: Secondary | ICD-10-CM

## 2022-07-30 DIAGNOSIS — M7741 Metatarsalgia, right foot: Secondary | ICD-10-CM | POA: Diagnosis not present

## 2022-07-30 DIAGNOSIS — Q6672 Congenital pes cavus, left foot: Secondary | ICD-10-CM

## 2022-07-30 DIAGNOSIS — M14671 Charcot's joint, right ankle and foot: Secondary | ICD-10-CM | POA: Diagnosis not present

## 2022-07-30 DIAGNOSIS — E119 Type 2 diabetes mellitus without complications: Secondary | ICD-10-CM

## 2022-07-30 DIAGNOSIS — M14679 Charcot's joint, unspecified ankle and foot: Secondary | ICD-10-CM

## 2022-07-30 DIAGNOSIS — Q6671 Congenital pes cavus, right foot: Secondary | ICD-10-CM

## 2022-07-30 NOTE — Progress Notes (Incomplete)
Patient presents to the office today for diabetic shoe and insole measuring.  Patient was measured with brannock device to determine size and width for 1 pair of extra depth shoes and foam casted for 3 pair of insoles.   Documentation of medical necessity will be sent to patient's treating diabetic doctor to verify and sign.   Patient's diabetic provider: Terrill Mohr, MD  Shoes and insoles will be ordered at that time and patient will be notified for an appointment for fitting when they arrive.    Shoe size (per patient): 8 medium women's   Brannock measurement: 8.5 medium women's  Patient shoe selection-   Shoe choice:   [id number] Apex Walker  Shoe size ordered: 8.5 medium women's

## 2022-08-01 NOTE — Progress Notes (Signed)
  Subjective:  Patient ID: [name], female    DOB: 1941-04-18,  MRN: [id number]  Chief Complaint  Patient presents with   Charcot    Charcots joint arthropathy Referring Provider: Terrill Mohr A   Diabetes    Diabetic foot care, patient also takes the blood thinner Eliquis    81 y.o. female presents with the above complaint. History confirmed with patient.  She was told that she has Charcot foot deformity  Objective:  Physical Exam: warm, good capillary refill, no trophic changes or ulcerative lesions, normal DP and PT pulses, abnormal sensory exam, she is grossly neuropathic, and flexible pes cavus foot type, there is tenderness and prominence of the second metatarsal head plantar bilateral.    Radiographs: Multiple views x-ray of both feet: no fracture, dislocation, swelling or degenerative changes noted and pes cavus foot type, no evidence of Charcot neuroarthropathy Assessment:   1. Pes cavus of both feet   2. Metatarsalgia of both feet   3. Type 2 diabetes mellitus without complication, without long-term current use of insulin ([place])      Plan:  Patient was evaluated and treated and all questions answered.  I discussed with her I do not see any evidence of Charcot neuroarthropathy.  She does have a pes cavus foot type and its possible that her other doctor had confused this with Charcot-Marie-Tooth disease.  Clinically she had no evidence of Charcot arthropathy as well.  I do think she would benefit from extra-depth diabetic shoe and a custom molded multi density insole to offload the area she does have forefoot prominence and this is a risk of ulceration with her neuropathy.  Patient educated on diabetes. Discussed proper diabetic foot care and discussed risks and complications of disease. Educated patient in depth on reasons to return to the office immediately should he/she discover anything concerning or new on the feet. All questions answered. Discussed  proper shoes as well.    Return in about 1 year (around 07/31/2023) for diabetic foot exam.

## 2022-08-02 NOTE — Progress Notes (Unsigned)
fatig   Cardiology Office Note    Date:  08/05/2022   ID:  [name], Grand 1940/11/03, MRN [id number]  PCP:  [name], MD  Cardiologist:  Ailany Koren [place], MD  Electrophysiologist:  Lewayne Bunting, MD   Chief Complaint: f/u SOB  History of Present Illness:   [name] is a 81 y.o. female with history of HTN, sinus node dysfunction s/p MDT PPM (gen change 2017), migraines, DM, fibromyalgia, HLD, CVA, TIAs, hiatal hernia, nonobstructive CAD, PAF, RLS, chronic edema who presents for follow-up.   She has prior history of evalulation for SOB and chest pain. Cardiac workup was previously unrevealing. Cath 04/2015 showed 10% prox RCA and 25% prox LAD with normal LVEF and normal LVEDP. She then saw Dr. Sherene Sires in 2020 for evaluation of SOB and nocturnal wheezing which he felt were out of proportion to objective pulmonary findings, possibly related to deconditioning and potentially anxiety/depression. Labs were unrevealing including normal d-dimer and BNP.  Coronary CT 04/2019 which showed showed calcium score of 34 (42nd percentile), mild nonobstructive CAD (0-25% RCA, 25-39% prox-mid LAD), small hiatal hernia. Device interrogation was unrevealing at that time.  Echo 05/08/19 showed EF 60-65%, normal diastolic function despite mild LA dilation, mildly increased LV wall thickness, normal RV, mild LAE/RAE. Her subsequent  device interrogation detected new onset atrial fibrillation so Dr. Ladona Ridgel stopped her Plavix and started her on Eliquis. He also switched her HCTZ to Lasix for possible component of diastolic congestion.  She did undergo a CPX in November 2021 and this was normal for age with hypertensive BP response and suggestion of diastolic dysfunction with exercise. BP control recommended.   She is seen back for follow-up feeling stable. She has a lot of stress caring for her husband who is 109 and has heart and renal failure. She reports her breathing is OK. No edema. Denies  any palpitations, dizziness, chest pain. Her major complaint is of GI issues. Seeing Dr Leone Payor.    Past Medical History:  Diagnosis Date   Allergy    Arthritis    CAD (coronary artery disease)    a. mild by prior cath; coronary CT 04/2019.   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/2016   TIA (transient ischemic attack) 11/15/2015   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    Past Surgical History:  Procedure Laterality Date   ABDOMINAL HYSTERECTOMY     APPENDECTOMY     bil carple tunnel surgery     CARDIAC CATHETERIZATION N/A 04/09/2015   Procedure: Left Heart Cath and Coronary Angiography;  Surgeon: Melvie Paglia M [place], MD;  Location: [facility] INVASIVE CV LAB;  Service: Cardiovascular;  Laterality: N/A;   COLONOSCOPY  multiple   EP IMPLANTABLE DEVICE N/A 04/09/2016   Procedure: PPM Generator Changeout;  Surgeon: Marinus Maw, MD;  Location: [facility] INVASIVE CV LAB;  Service: Cardiovascular;  Laterality: N/A;   ESOPHAGEAL MANOMETRY N/A 08/02/2017   Procedure:  ESOPHAGEAL MANOMETRY (EM);  Surgeon: Napoleon Form, MD;  Location: [facility] ENDOSCOPY;  Service: Endoscopy;  Laterality: N/A;   KNEE SURGERY     right x 2   NECK SURGERY     x2   PACEMAKER INSERTION     PH IMPEDANCE STUDY N/A 08/02/2017   Procedure: PH IMPEDANCE STUDY;  Surgeon: Napoleon Form, MD;  Location: [facility] ENDOSCOPY;  Service: Endoscopy;  Laterality: N/A;   UPPER GASTROINTESTINAL ENDOSCOPY      Current  Medications: Current Meds  Medication Sig   atorvastatin (LIPITOR) 80 MG tablet Take 1 tablet (80 mg total) by mouth every evening.   glipiZIDE (GLUCOTROL) 5 MG tablet Take by mouth 2 (two) times daily before a meal.   glucose blood (ONETOUCH ULTRA) test strip 2 (two) times daily.   pantoprazole (PROTONIX) 40 MG tablet Take 1 tablet (40 mg total) by mouth 2 (two) times daily before a meal.   [DISCONTINUED] amLODipine (NORVASC) 2.5 MG tablet TAKE 1 TABLET(2.5 MG) BY MOUTH DAILY   [DISCONTINUED] ELIQUIS 5 MG TABS tablet TAKE 1 TABLET(5 MG) BY MOUTH TWICE DAILY   [DISCONTINUED] furosemide (LASIX) 20 MG tablet TAKE 1 TABLET(20 MG) BY MOUTH DAILY   [DISCONTINUED] potassium chloride SA (KLOR-CON) 20 MEQ tablet TAKE 1 TABLET(20 MEQ) BY MOUTH DAILY      Allergies:   Citalopram, Codeine, Cymbalta [duloxetine hcl], Metformin and related, Paroxetine hcl, Trazodone and nefazodone, and Caffeine   Social History   Socioeconomic History   Marital status: Married    Spouse name: james   Number of children: 2   Years of education: 12   Highest education level: Not on file  Occupational History   Occupation: Retired    Associate Professor: RETIRED  Tobacco Use   Smoking status: Never   Smokeless tobacco: Never  Vaping Use   Vaping Use: Never used  Substance and Sexual Activity   Alcohol use: No    Alcohol/week: 0.0 standard drinks of alcohol   Drug use: No   Sexual activity: Not Currently  Other Topics Concern   Not on file  Social History Narrative   Patient is married with 2 children. She lives at home with her husband.    Patient is right handed.   Patient has 12 th grade education.   Patient drinks occasional caffeine.   Social Determinants of Health   Financial Resource Strain: Not on file  Food Insecurity: Not on file  Transportation Needs: Not on file  Physical Activity: Not on file  Stress: Not on file  Social Connections: Not on file     Family History:  The patient's family  history includes Breast cancer in her maternal aunt; Colon cancer in her brother, father, and sister; Colon polyps in her sister; Coronary artery disease in her father; Crohn's disease in her sister; Dementia in her daughter and mother; Diabetes in her cousin, daughter, father, maternal aunt, and maternal uncle; Heart attack in her brother, daughter, and father; Irritable bowel syndrome in her sister; Kidney disease in her daughter; Lung cancer in her father and mother; Prostate cancer in her father. There is no history of Hypertension, Esophageal cancer, Rectal cancer, or Stomach cancer.  ROS:   Please see the history of present illness. Otherwise, review of systems is positive for nocturnal wheezing sometimes All other systems are reviewed and otherwise negative.    EKGs/Labs/Other Studies Reviewed:    Studies reviewed are outlined and summarized above. Reports included below if pertinent.  2D Echo 05/2019  1. The left ventricle has normal systolic function with an ejection  fraction of 60-65%. The cavity size was normal. There is mildly increased  left ventricular wall thickness. Mitral medial e' is 8 cm/s, suggesting  normal diastolic function despite mild  LA dilation.   2. The right ventricle has normal systolic function. The cavity was  normal. There is no increase in right ventricular wall thickness. Right  ventricular systolic pressure is normal with an estimated pressure of 28.4  mmHg.   3. Left atrial size was mildly dilated.   4. Right atrial size was mildly dilated  Cor CT 04/2019 FINDINGS: A 120 kV prospective scan was triggered in the descending thoracic aorta at 111 HU's. Axial non-contrast 3 mm slices were carried out through the heart. The data set was analyzed on a dedicated work station and scored using the Agatson method. Gantry rotation speed was 250 msecs and collimation was .6 mm. No beta blockade and 0.8 mg of sl NTG was given. The 3D data set was reconstructed  in 5% intervals of the 67-82 % of the R-R cycle. Diastolic phases were analyzed on a dedicated work station using MPR, MIP and VRT modes. The patient received 80 cc of contrast.   Aorta: Normal size. Minimal diffuse atherosclerotic plaque and calcifications. No dissection.   Aortic Valve:  Trileaflet.  No calcifications.   Coronary Arteries:  Normal coronary origin.  Right dominance.   RCA is a large dominant artery that gives rise to PDA and PLVB. There is minimal diffuse calcified plaque with stenosis 0-25%.   Left main is a short artery that gives rise to LAD and LCX arteries. Left main has no plaque.   LAD is a large vessel that gives rise to two small diagonal arteries. There is mild calcified plaque in the proximal and mid LAD with stenosis 25-49%.   LCX is a non-dominant artery that gives rise to one large OM1 branch. There is no plaque.   Other findings:   Normal pulmonary vein drainage into the left atrium.   A large left atrial appendage without an evidence for a thrombus.   Normal size of the pulmonary artery.   IMPRESSION: 1. Coronary calcium score of 34. This was 65 percentile for age and sex matched control.   2. Normal coronary origin with right dominance.   3. Mild non-obstructive CAD, aggressive risk factor modification is recommended.   4. Pacemaker leads are seen in the right atrium and right ventricle.     Electronically Signed   By: Tobias Alexander   On: 04/10/2019 12:45  IMPRESSION: Small hiatal hernia.   No acute extra cardiac abnormality.   Electronically Signed: By: Charlett Nose M.D. On: 04/10/2019 12:33     CPX 09/12/20: Cardiopulmonary exercise test Order: [id number] Status: Final result   Visible to patient: Yes (seen)   Next appt: 02/06/2021 at 11:00 AM in Cardiology (Garet Hooton [place], MD)   Dx: Chronic shortness of breath   3 Result Notes      Narrative  Referred for: dyspnea   Procedure: This patient underwent staged  symptom-limited exercise treadmill testing using a modified Naughton protocol with expired gas analysis metabolic evaluation during exercise.   Demographics   Age: 84 Ht. (in.) 66 Wt. (lb) 161.2 BMI: 26.0      Predicted Peak VO2: 17.1   Gender: Female Ht (cm) 167.6 Wt. (kg) 73.1    Results   Pre-Exercise PFTs   FVC 2.18 (77%)  FEV1 1.70 (79%)         FEV1/FVC 78 (100%)         MVV 73 (85%)        Exercise Time:    9:30   Speed (mph): 2.0       Grade (%): 10.5       RPE: 18   Reason stopped: dyspnea (8/10)   Additional symptoms: lightheaded (7/10)   Resting HR: 63 Standing HR: 63 Peak HR: 137   (96% age predicted max HR)   BP rest: 148/74 Standing BP: 130/68 BP peak: 204/58   Peak VO2: 16.4 (96% predicted peak VO2)   VE/VCO2 slope:  34   OUES: 1.35   Peak RER: 1.11   Ventilatory Threshold: 13.1 (77% predicted or measured peak VO2)   Peak RR 34   Peak Ventilation:  49.7   VE/MVV:  68%   PETCO2 at peak:  32   O2pulse:  8   (89% predicted O2pulse)    Interpretation   Notes: Patient gave a very good effort. Pulse-oximetry remained 96-99% for the duration of exercise.   ECG:  Resting ECG in normal sinus rhythm with atrial pacing. HR response appropriate. There were no sustained arrhythmias or ST-T changes. BP response hypertensive at peak.   PFT:  Pre-exercise spirometry was within mostly normal limits. The MVV was normal.    CPX:  Exercise testing with gas exchange demonstrates a normal peak VO2 of 16.4 ml/kg/min (96% of the age/gender/weight matched sedentary norms). The RER of 1.11 indicates a maximal effort. When adjusted to the patient's ideal body weight of 145.9 lb (66.2 kg) the peak VO2 is 18.1 ml/kg (ibw)/min (102% of the ibw-adjusted predicted). The VE/VCO2 slope is mildly elevated and indicates mildly increased dead space ventilation. The oxygen uptake efficiency slope (OUES) is normal. The VO2 at the ventilatory threshold was normal at 77% of  the predicted peak VO2. At peak exercise, the ventilation reached 68% of the measured MVV indicating ventilatory reserve remained. The O2pulse (a surrogate for stroke volume) increased initially, but remained flat for the majority of incremental intensity exercise, at a peak of 778ml/beat (89% predicted).    Conclusion: Exercise testing with gas exchange demonstrates normal functional capacity when compared to matched sedentary norms. There is no clear cardiopulmonary limitation. However, there was a hypertensive response with early flattening of O2 pulse.    Test, report and preliminary impression by:  Lesia HausenKristen Thomas, MS, ACSM-RCEP  09/12/2020 4:14 PM   Attending: Overall normal function capacity for age and gender. Resting spirometry reveals mild lung restriction and may be related to her body habitus. There is a normal HR response to exercise. There is a hypertensive response to exercise with mildly increased VE/VCO2 slope which likely suggests diastolic dysfunction and elevated filling pressures during exercise. Would recommend improved BP control, weight loss and routine exercise training.   Arvilla Meresaniel Bensimhon, MD  3:26 PM         EKG:  EKG is not ordered today  Recent Labs: No results found for requested labs within last 365 days.  Recent Lipid Panel    Component Value Date/Time   CHOL 161 05/11/2016 0638   CHOL 158 11/15/2015 1415   TRIG 134 05/11/2016 0638   HDL 50 05/11/2016 0638   HDL 50 11/15/2015 1415   CHOLHDL 3.2 05/11/2016 0638   VLDL 27 05/11/2016 0638   LDLCALC 84 05/11/2016 0638   LDLCALC 82 11/15/2015 1415   Dated 01/15/21: A1c 6.5%. glucose 121.  CMET and CBC normal. B12 normal, TSH normal.    PHYSICAL EXAM:    VS:  BP 122/70   Pulse 68   Ht 5\' 6"  (1.676 m)   Wt 159 lb (72.1 kg)   SpO2 99%   BMI 25.66 kg/m   BMI: Body mass index is 25.66 kg/m.  GEN: Well nourished, well developed WF, in no acute distress HEENT: normocephalic, atraumatic Neck: no JVD,  carotid bruits, or masses Cardiac: RRR; no murmurs, rubs, or gallops, no edema (bilateral compression hose in place) Respiratory:  clear to auscultation bilaterally, normal work of breathing GI: soft, nontender, nondistended, + BS MS: no deformity or atrophy Skin: warm and dry, no rash Neuro:  Alert and Oriented x 3, Strength and sensation are intact, follows commands Psych: euthymic mood, full affect  Wt Readings from Last 3 Encounters:  08/05/22 159 lb (72.1 kg)  07/22/22 158 lb 6.4 oz (71.8 kg)  05/13/22 163 lb 6.4 oz (74.1 kg)     ASSESSMENT & PLAN:   Paroxysmal atrial fibrillation - maintaining NSR on exam today. Afib burden very low on pacemaker follow up <0.1%. She is tolerating Eliquis well. CHADS2VA2SC score is 8.  Chronic shortness of breath - extensive cardiac and pulmonary evaluation as noted. CPX very reassuring. Continue to focus on exercise and weight control. On lasix. Stable today  Mild CAD - no anginal symptoms.  Not on BB due to h/o sinus node dysfunction. Not on ASA due to concomitant Eliquis.  Essential HTN - controlled on present regimen.  Chronic fatigue and restless leg syndrome - no change.  Disposition: F/u with me in one year  Medication Adjustments/Labs and Tests Ordered: Current medicines are reviewed at length with the patient today.  Concerns regarding medicines are outlined above. Medication changes, Labs and Tests ordered today are summarized above and listed in the Patient Instructions accessible in Encounters.   Signed, Isak Sotomayor [place], MD  08/05/2022 8:11 AM

## 2022-08-05 ENCOUNTER — Ambulatory Visit: Payer: Medicare Other | Attending: Cardiology | Admitting: Cardiology

## 2022-08-05 ENCOUNTER — Encounter: Payer: Self-pay | Admitting: Cardiology

## 2022-08-05 VITALS — BP 122/70 | HR 68 | Ht 66.0 in | Wt 159.0 lb

## 2022-08-05 DIAGNOSIS — I10 Essential (primary) hypertension: Secondary | ICD-10-CM | POA: Insufficient documentation

## 2022-08-05 DIAGNOSIS — I480 Paroxysmal atrial fibrillation: Secondary | ICD-10-CM | POA: Insufficient documentation

## 2022-08-05 DIAGNOSIS — I495 Sick sinus syndrome: Secondary | ICD-10-CM | POA: Insufficient documentation

## 2022-08-05 DIAGNOSIS — Z950 Presence of cardiac pacemaker: Secondary | ICD-10-CM | POA: Diagnosis not present

## 2022-08-05 MED ORDER — POTASSIUM CHLORIDE CRYS ER 20 MEQ PO TBCR
EXTENDED_RELEASE_TABLET | ORAL | 3 refills | Status: AC
Start: 2022-08-05 — End: ?

## 2022-08-05 MED ORDER — APIXABAN 5 MG PO TABS
ORAL_TABLET | ORAL | 11 refills | Status: AC
Start: 2022-08-05 — End: ?

## 2022-08-05 MED ORDER — LOSARTAN POTASSIUM 50 MG PO TABS: 50.0000 mg | ORAL_TABLET | Freq: Every day | ORAL | 3 refills | Status: DC

## 2022-08-05 MED ORDER — FUROSEMIDE 20 MG PO TABS
ORAL_TABLET | ORAL | 3 refills | Status: AC
Start: 2022-08-05 — End: ?

## 2022-08-05 MED ORDER — AMLODIPINE BESYLATE 2.5 MG PO TABS: ORAL_TABLET | ORAL | 3 refills | Status: DC

## 2022-08-31 ENCOUNTER — Ambulatory Visit (INDEPENDENT_AMBULATORY_CARE_PROVIDER_SITE_OTHER): Payer: Medicare Other | Admitting: *Deleted

## 2022-08-31 DIAGNOSIS — Q6672 Congenital pes cavus, left foot: Secondary | ICD-10-CM | POA: Diagnosis not present

## 2022-08-31 DIAGNOSIS — M14679 Charcot's joint, unspecified ankle and foot: Secondary | ICD-10-CM | POA: Diagnosis not present

## 2022-08-31 DIAGNOSIS — Q6671 Congenital pes cavus, right foot: Secondary | ICD-10-CM

## 2022-08-31 DIAGNOSIS — E119 Type 2 diabetes mellitus without complications: Secondary | ICD-10-CM | POA: Diagnosis not present

## 2022-08-31 NOTE — Progress Notes (Signed)
Patient presents today to pick up diabetic shoes and insoles.  Patient was dispensed 1 pair of diabetic shoes and 3 pairs of foam casted diabetic insoles. Fit was satisfactory. Instructions for break-in and wear was reviewed and a copy was given to the patient.   Re-appointment for regularly scheduled diabetic foot care visits or if they should experience any trouble with the shoes or insoles.  

## 2022-10-08 ENCOUNTER — Ambulatory Visit (INDEPENDENT_AMBULATORY_CARE_PROVIDER_SITE_OTHER): Payer: Medicare Other

## 2022-10-08 DIAGNOSIS — I495 Sick sinus syndrome: Secondary | ICD-10-CM

## 2022-10-09 LAB — CUP PACEART REMOTE DEVICE CHECK
Battery Remaining Longevity: 41 mo
Battery Voltage: 2.96 V
Brady Statistic AP VP Percent: 0.25 %
Brady Statistic AP VS Percent: 95.13 %
Brady Statistic AS VP Percent: 1.62 %
Brady Statistic AS VS Percent: 3 %
Brady Statistic RA Percent Paced: 94.98 %
Brady Statistic RV Percent Paced: 1.82 %
Date Time Interrogation Session: 20231207153647
Implantable Lead Connection Status: 753985
Implantable Lead Connection Status: 753985
Implantable Lead Implant Date: 20070910
Implantable Lead Implant Date: 20070910
Implantable Lead Location: 753859
Implantable Lead Location: 753860
Implantable Lead Model: 5076
Implantable Lead Model: 5076
Implantable Pulse Generator Implant Date: 20170608
Lead Channel Impedance Value: 304 Ohm
Lead Channel Impedance Value: 399 Ohm
Lead Channel Impedance Value: 456 Ohm
Lead Channel Impedance Value: 513 Ohm
Lead Channel Pacing Threshold Amplitude: 0.625 V
Lead Channel Pacing Threshold Amplitude: 0.875 V
Lead Channel Pacing Threshold Pulse Width: 0.4 ms
Lead Channel Pacing Threshold Pulse Width: 0.4 ms
Lead Channel Sensing Intrinsic Amplitude: 0.75 mV
Lead Channel Sensing Intrinsic Amplitude: 0.75 mV
Lead Channel Sensing Intrinsic Amplitude: 12.75 mV
Lead Channel Sensing Intrinsic Amplitude: 12.75 mV
Lead Channel Setting Pacing Amplitude: 2 V
Lead Channel Setting Pacing Amplitude: 2.5 V
Lead Channel Setting Pacing Pulse Width: 0.4 ms
Lead Channel Setting Sensing Sensitivity: 2.8 mV
Zone Setting Status: 755011
Zone Setting Status: 755011

## 2022-10-30 NOTE — Progress Notes (Signed)
Remote pacemaker transmission.   

## 2023-01-07 ENCOUNTER — Ambulatory Visit: Payer: Medicare Other

## 2023-01-07 DIAGNOSIS — I495 Sick sinus syndrome: Secondary | ICD-10-CM

## 2023-01-07 LAB — CUP PACEART REMOTE DEVICE CHECK
Battery Remaining Longevity: 39 mo
Battery Voltage: 2.96 V
Brady Statistic AP VP Percent: 0.12 %
Brady Statistic AP VS Percent: 95.49 %
Brady Statistic AS VP Percent: 1.17 %
Brady Statistic AS VS Percent: 3.22 %
Brady Statistic RA Percent Paced: 94.9 %
Brady Statistic RV Percent Paced: 1.29 %
Date Time Interrogation Session: 20240307103602
Implantable Lead Connection Status: 753985
Implantable Lead Connection Status: 753985
Implantable Lead Implant Date: 20070910
Implantable Lead Implant Date: 20070910
Implantable Lead Location: 753859
Implantable Lead Location: 753860
Implantable Lead Model: 5076
Implantable Lead Model: 5076
Implantable Pulse Generator Implant Date: 20170608
Lead Channel Impedance Value: 304 Ohm
Lead Channel Impedance Value: 399 Ohm
Lead Channel Impedance Value: 399 Ohm
Lead Channel Impedance Value: 437 Ohm
Lead Channel Pacing Threshold Amplitude: 0.875 V
Lead Channel Pacing Threshold Amplitude: 0.875 V
Lead Channel Pacing Threshold Pulse Width: 0.4 ms
Lead Channel Pacing Threshold Pulse Width: 0.4 ms
Lead Channel Sensing Intrinsic Amplitude: 0.75 mV
Lead Channel Sensing Intrinsic Amplitude: 0.75 mV
Lead Channel Sensing Intrinsic Amplitude: 14 mV
Lead Channel Sensing Intrinsic Amplitude: 14 mV
Lead Channel Setting Pacing Amplitude: 2.25 V
Lead Channel Setting Pacing Amplitude: 2.5 V
Lead Channel Setting Pacing Pulse Width: 0.4 ms
Lead Channel Setting Sensing Sensitivity: 2.8 mV
Zone Setting Status: 755011
Zone Setting Status: 755011

## 2023-02-09 NOTE — Progress Notes (Signed)
Remote pacemaker transmission.   

## 2023-04-07 ENCOUNTER — Other Ambulatory Visit: Payer: Self-pay | Admitting: Family Medicine

## 2023-04-07 ENCOUNTER — Telehealth: Payer: Self-pay

## 2023-04-07 DIAGNOSIS — Z1231 Encounter for screening mammogram for malignant neoplasm of breast: Secondary | ICD-10-CM

## 2023-04-07 NOTE — Telephone Encounter (Signed)
Pt calling to schedule her yearly appointment with Dr. Ladona Ridgel. Her phone number is [phone].

## 2023-04-08 ENCOUNTER — Ambulatory Visit (INDEPENDENT_AMBULATORY_CARE_PROVIDER_SITE_OTHER): Payer: Medicare Other

## 2023-04-08 DIAGNOSIS — I495 Sick sinus syndrome: Secondary | ICD-10-CM | POA: Diagnosis not present

## 2023-04-08 LAB — CUP PACEART REMOTE DEVICE CHECK
Battery Remaining Longevity: 35 mo
Battery Voltage: 2.96 V
Brady Statistic AP VP Percent: 0.12 %
Brady Statistic AP VS Percent: 81.98 %
Brady Statistic AS VP Percent: 3.35 %
Brady Statistic AS VS Percent: 14.55 %
Brady Statistic RA Percent Paced: 79.3 %
Brady Statistic RV Percent Paced: 3.44 %
Date Time Interrogation Session: 20240606094310
Implantable Lead Connection Status: 753985
Implantable Lead Connection Status: 753985
Implantable Lead Implant Date: 20070910
Implantable Lead Implant Date: 20070910
Implantable Lead Location: 753859
Implantable Lead Location: 753860
Implantable Lead Model: 5076
Implantable Lead Model: 5076
Implantable Pulse Generator Implant Date: 20170608
Lead Channel Impedance Value: 304 Ohm
Lead Channel Impedance Value: 399 Ohm
Lead Channel Impedance Value: 399 Ohm
Lead Channel Impedance Value: 437 Ohm
Lead Channel Pacing Threshold Amplitude: 0.875 V
Lead Channel Pacing Threshold Amplitude: 1 V
Lead Channel Pacing Threshold Pulse Width: 0.4 ms
Lead Channel Pacing Threshold Pulse Width: 0.4 ms
Lead Channel Sensing Intrinsic Amplitude: 1.75 mV
Lead Channel Sensing Intrinsic Amplitude: 1.75 mV
Lead Channel Sensing Intrinsic Amplitude: 10.125 mV
Lead Channel Sensing Intrinsic Amplitude: 10.125 mV
Lead Channel Setting Pacing Amplitude: 2 V
Lead Channel Setting Pacing Amplitude: 2.5 V
Lead Channel Setting Pacing Pulse Width: 0.4 ms
Lead Channel Setting Sensing Sensitivity: 2.8 mV
Zone Setting Status: 755011
Zone Setting Status: 755011

## 2023-04-28 NOTE — Progress Notes (Signed)
Remote pacemaker transmission.   

## 2023-05-24 ENCOUNTER — Ambulatory Visit
Admission: RE | Admit: 2023-05-24 | Discharge: 2023-05-24 | Disposition: A | Discharge status: Home / Self Care | Payer: Medicare Other | Source: Ambulatory Visit | Attending: Family Medicine | Admitting: Family Medicine

## 2023-05-24 DIAGNOSIS — Z1231 Encounter for screening mammogram for malignant neoplasm of breast: Secondary | ICD-10-CM

## 2023-07-08 ENCOUNTER — Ambulatory Visit (INDEPENDENT_AMBULATORY_CARE_PROVIDER_SITE_OTHER): Payer: Medicare Other

## 2023-07-08 DIAGNOSIS — I495 Sick sinus syndrome: Secondary | ICD-10-CM | POA: Diagnosis not present

## 2023-07-08 LAB — CUP PACEART REMOTE DEVICE CHECK
Battery Remaining Longevity: 31 mo
Battery Voltage: 2.95 V
Brady Statistic AP VP Percent: 0.18 %
Brady Statistic AP VS Percent: 90.37 %
Brady Statistic AS VP Percent: 1.72 %
Brady Statistic AS VS Percent: 7.74 %
Brady Statistic RA Percent Paced: 88.95 %
Brady Statistic RV Percent Paced: 1.89 %
Date Time Interrogation Session: 20240905084741
Implantable Lead Connection Status: 753985
Implantable Lead Connection Status: 753985
Implantable Lead Implant Date: 20070910
Implantable Lead Implant Date: 20070910
Implantable Lead Location: 753859
Implantable Lead Location: 753860
Implantable Lead Model: 5076
Implantable Lead Model: 5076
Implantable Pulse Generator Implant Date: 20170608
Lead Channel Impedance Value: 285 Ohm
Lead Channel Impedance Value: 380 Ohm
Lead Channel Impedance Value: 380 Ohm
Lead Channel Impedance Value: 418 Ohm
Lead Channel Pacing Threshold Amplitude: 0.875 V
Lead Channel Pacing Threshold Amplitude: 0.875 V
Lead Channel Pacing Threshold Pulse Width: 0.4 ms
Lead Channel Pacing Threshold Pulse Width: 0.4 ms
Lead Channel Sensing Intrinsic Amplitude: 1.125 mV
Lead Channel Sensing Intrinsic Amplitude: 1.125 mV
Lead Channel Sensing Intrinsic Amplitude: 10.25 mV
Lead Channel Sensing Intrinsic Amplitude: 10.25 mV
Lead Channel Setting Pacing Amplitude: 2 V
Lead Channel Setting Pacing Amplitude: 2.5 V
Lead Channel Setting Pacing Pulse Width: 0.4 ms
Lead Channel Setting Sensing Sensitivity: 2.8 mV
Zone Setting Status: 755011
Zone Setting Status: 755011

## 2023-07-14 NOTE — Progress Notes (Signed)
Remote pacemaker transmission.   

## 2023-07-29 ENCOUNTER — Ambulatory Visit: Payer: Medicare Other | Admitting: Podiatry

## 2023-08-01 NOTE — Progress Notes (Unsigned)
Cardiology Office Note    Date:  08/02/2023  ID:  [name] 01-18-1941, MRN [id number] PCP:  [name], MD  Cardiologist:  Peter Swaziland, MD  Electrophysiologist:  Lewayne Bunting, MD   Chief Complaint: Follow up chronic shortness of breath and PAF.   History of Present Illness: [name]    [name] is a 82 y.o. female with visit-pertinent history of hypertension, sinus node dysfunction s/p MDT PPM (generator change in 2017), migraines, diabetes mellitus type 2, fibromyalgia, hyperlipidemia, CVA, TIAs, hiatal hernia, nonobstructive CAD, PAF on Eliquis.   [name] has prior history of evaluation for shortness of breath and chest pain.  Cardiac workup was previously unrevealing she had cardiac cath in/2016 that showed 10% proximal RCA and 25 send proximal LAD with normal LVEF and normal LVEDP.  Evaluated by Dr. Sherene Sires 2020 for evaluation of shortness of breath and nocturnal wheezing which he felt were out of proportion to objective pulmonary findings, possibly related to deconditioning and potentially her anxiety/depression.  Her labs were overall unrevealing including a normal D-dimer and BNP.  She was previously started on Imdur in case there was a component of microvascular angina.  Coronary CTA in 04/2019 calcium score of 34, 42nd percentile, mild nonobstructive CAD (0-25% RCA, 25-39% prox-mid LAD), small hiatal hernia.  Device interrogation was unrevealing at that time.  She reported improvement on Imdur but then reported some leg swelling.  Echo 05/08/19 showed LVEF 60 to 65%, normal diastolic function despite mild LA dilation, mildly increased LV wall thickness, normal RV, mild LAE/RAE.  Her HCTZ and amlodipine were adjusted with improvement, following this she stopped Imdur.  In 2021 her device interrogation indicated new onset atrial fibrillation. Plavix was discontinued and she was started on Eliquis.  In 09/2020 she underwent a CPX which was normal for  age, with hypertensive BP response and suggestion of diastolic dysfunction with exercise.  She was last seen by Dr. Swaziland on 08/05/2022, she remained stable from a cardiac perspective at that time.  Reported that her breathing was at baseline.  Today she presents for follow up, she reports she is doing well. She does endorse dyspnea on exertion that she feels has gotten worse in recent weeks. She questions if this is related to her diarrhea and vomiting she had frequently over the summer, currently being worked up by [facility].  She also endorses lower extremity edema that worsens throughout the day and improves overnight as well as a burning pain in her feet that she attributes to her neuropathy.  She also notes she has a history of venous insufficiency and varicose veins.  She endorses increased stress related to caring for her husband, she has recently hired another caregiver to assist her, to relieve caregiver burden.   Labwork independently reviewed: 03/04/23: Hemoglobin 14, hematocrit 41.7, sodium 140, potassium 4.0, fattening 0.80, AST 23, ALT 17, hemoglobin A1C 6.6  07/17/2022: Total cholesterol 157, glycerides 147, HDL 42, LDL 93  ROS: .   Today she denies chest pain, palpitations, melena, hematuria, hemoptysis, diaphoresis, weakness, presyncope, syncope, orthopnea, and PND.  All other systems are reviewed and otherwise negative.  Studies Reviewed: [name]    EKG:  EKG is ordered today, personally reviewed, demonstrating  EKG Interpretation Date/Time:  Monday August 02 2023 10:34:52 EDT Ventricular Rate:  79 PR Interval:  284 QRS Duration:  74 QT Interval:  392 QTC Calculation: 449 R Axis:   -25  Text Interpretation: Atrial-paced rhythm with prolonged AV conduction  Nonspecific T wave abnormality Confirmed by Reather Littler [phone]) on 08/02/2023 10:41:00 AM    Cardiac Studies & Procedures   CARDIAC CATHETERIZATION  CARDIAC CATHETERIZATION 04/09/2015  Narrative  Prox RCA lesion, 10%  stenosed.  Prox LAD lesion, 25% stenosed.  1. Nonobstructive CAD- mild 2. Normal LV function 3. Normal LVEDP  Plan: extensive cardiac evaluation has been negative. Will stop ACE inhibitor and try an ARB. If symptoms do not improve will need pulmonary evaluation.  Findings Coronary Findings Diagnostic  Dominance: Right  Left Main The vessel , is normal in caliber is angiographically normal.  Left Anterior Descending discrete .  Left Circumflex The vessel , is normal in caliber is angiographically normal.  Right Coronary Artery discrete .  Intervention  No interventions have been documented.   STRESS TESTS  MYOCARDIAL PERFUSION IMAGING 04/02/2015  Narrative  Myocardial perfusion is abnormal. Moderate size and intensity, reversible anterior perfusion defect. Findings consistent with ischemia. This is an intermediate risk study. Overall left ventricular systolic function was normal. LV cavity size is normal. The left ventricular ejection fraction is hyperdynamic (>65%). There is no prior study for comparison.   ECHOCARDIOGRAM  ECHOCARDIOGRAM COMPLETE 05/08/2019  Narrative ECHOCARDIOGRAM REPORT    Patient Name:   [name] Date of Exam: 05/08/2019 Medical Rec #:  [id number]                 Height:       66.0 in Accession #:    [id number]                Weight:       167.0 lb Date of Birth:  11-24-1940                 BSA:          1.85 m Patient Age:    78 years                  BP:           144/71 mmHg Patient Gender: F                         HR:           61 bpm. Exam Location:  [address]   Procedure: 2D Echo, Cardiac Doppler and Color Doppler  Indications:    R06.09 Dyspnea on exertion  History:        Patient has prior history of Echocardiogram examinations, most recent 05/11/2016. Previous Myocardial Infarction TIA Risk Factors: Hypertension, Diabetes and Dyslipidemia. Pacemaker. Fibromyalgia. Sinus node dysfunction.  Sonographer:     Cathie Beams RCS Referring Phys: 68 DAYNA N DUNN  IMPRESSIONS   1. The left ventricle has normal systolic function with an ejection fraction of 60-65%. The cavity size was normal. There is mildly increased left ventricular wall thickness. Mitral medial e' is 8 cm/s, suggesting normal diastolic function despite mild LA dilation. 2. The right ventricle has normal systolic function. The cavity was normal. There is no increase in right ventricular wall thickness. Right ventricular systolic pressure is normal with an estimated pressure of 28.4 mmHg. 3. Left atrial size was mildly dilated. 4. Right atrial size was mildly dilated.  FINDINGS Left Ventricle: The left ventricle has normal systolic function, with an ejection fraction of 60-65%. The cavity size was normal. There is mildly increased left ventricular wall thickness. Mitral medial e' is 8 cm/s, suggesting normal diastolic function despite mild LA dilation.[name]  Right Ventricle: The right ventricle has normal systolic function. The cavity was normal. There is no increase in right ventricular wall thickness. Right ventricular systolic pressure is normal with an estimated pressure of 28.4 mmHg. Pacing wire/catheter visualized in the right ventricle.  Left Atrium: Left atrial size was mildly dilated.  Right Atrium: Right atrial size was mildly dilated. Right atrial pressure is estimated at 3 mmHg.  Interatrial Septum: The interatrial septum was not well visualized.  Pericardium: There is no evidence of pericardial effusion.  Mitral Valve: The mitral valve is normal in structure. Mitral valve regurgitation is trivial by color flow Doppler.  Tricuspid Valve: The tricuspid valve is normal in structure. Tricuspid valve regurgitation is trivial by color flow Doppler.  Aortic Valve: The aortic valve is normal in structure. Aortic valve regurgitation was not visualized by color flow Doppler.  Pulmonic Valve: The pulmonic valve was normal in  structure. Pulmonic valve regurgitation is trivial by color flow Doppler.  Venous: The inferior vena cava is normal in size with greater than 50% respiratory variability.   +--------------+--------++ LEFT VENTRICLE         +----------------+----------++ +--------------+--------++ Diastology                 PLAX 2D                +----------------+----------++ +--------------+--------++ LV e' lateral:  10.90 cm/s LVIDd:        4.00 cm  +----------------+----------++ +--------------+--------++ LV E/e' lateral:11.0       LVIDs:        2.50 cm  +----------------+----------++ +--------------+--------++ LV e' medial:   7.94 cm/s  LV PW:        1.30 cm  +----------------+----------++ +--------------+--------++ LV E/e' medial: 15.1       LV IVS:       1.20 cm  +----------------+----------++ +--------------+--------++ LVOT diam:    1.90 cm  +--------------+--------++ LV SV:        48 ml    +--------------+--------++ LV SV Index:  25.17    +--------------+--------++ LVOT Area:    2.84 cm +--------------+--------++                        +--------------+--------++  +---------------+----------++ RIGHT VENTRICLE           +---------------+----------++ RV Basal diam: 2.40 cm    +---------------+----------++ RV S prime:    13.90 cm/s +---------------+----------++ TAPSE (M-mode):2.4 cm     +---------------+----------++ RVSP:          28.4 mmHg  +---------------+----------++  +---------------+-------++-----------++ LEFT ATRIUM           Index       +---------------+-------++-----------++ LA diam:       4.00 cm2.16 cm/m  +---------------+-------++-----------++ LA Vol (A2C):  72.2 ml38.98 ml/m +---------------+-------++-----------++ LA Vol (A4C):  64.0 ml34.55 ml/m +---------------+-------++-----------++ LA Biplane Vol:71.1 ml38.39  ml/m +---------------+-------++-----------++ +------------+---------++-----------++ RIGHT ATRIUM         Index       +------------+---------++-----------++ RA Pressure:3.00 mmHg            +------------+---------++-----------++ RA Area:    20.50 cm            +------------+---------++-----------++ RA Volume:  55.60 ml 30.02 ml/m +------------+---------++-----------++ +------------+-----------++ AORTIC VALVE            +------------+-----------++ LVOT Vmax:  110.00 cm/s +------------+-----------++ LVOT Vmean: 68.700 cm/s +------------+-----------++ LVOT VTI:   0.251 m     +------------+-----------++  +-------------+-------++ AORTA                +-------------+-------++  Ao Root diam:2.80 cm +-------------+-------++  +--------------+--------++    +---------------+-----------++ MITRAL VALVE              TRICUSPID VALVE            +--------------+--------++    +---------------+-----------++ MV Area (PHT):cm         TR Peak grad:  25.4 mmHg   +--------------+--------++    +---------------+-----------++ MV PHT:       msec        TR Vmax:       299.00 cm/s +--------------+--------++    +---------------+-----------++ MV Decel Time:162 msec    Estimated RAP: 3.00 mmHg   +--------------+--------++    +---------------+-----------++ +--------------+-----------++ RVSP:          28.4 mmHg   MV E velocity:120.00 cm/s +---------------+-----------++ +--------------+-----------++ MV A velocity:72.70 cm/s  +--------------+-------+ +--------------+-----------++ SHUNTS                MV E/A ratio: 1.65        +--------------+-------+ +--------------+-----------++ Systemic VTI: 0.25 m  +--------------+-------+ Systemic Diam:1.90 cm +--------------+-------+   Weston Brass MD Electronically signed by Weston Brass MD Signature Date/Time: 05/08/2019/5:46:14 PM    Final      CT SCANS  CT CORONARY MORPH W/CTA COR W/SCORE 04/10/2019  Addendum 04/10/2019 12:48 PM ADDENDUM REPORT: 04/10/2019 12:45  CLINICAL DATA:  82 year old female with h/o diabetes, hypertension, hyperlipidemia, s/p PM placement now with dyspnea on exertion.  EXAM: Cardiac/Coronary  CT  TECHNIQUE: The patient was scanned on a [employer].  FINDINGS: A 120 kV prospective scan was triggered in the descending thoracic aorta at 111 HU's. Axial non-contrast 3 mm slices were carried out through the heart. The data set was analyzed on a dedicated work station and scored using the Agatson method. Gantry rotation speed was 250 msecs and collimation was .6 mm. No beta blockade and 0.8 mg of sl NTG was given. The 3D data set was reconstructed in 5% intervals of the 67-82 % of the R-R cycle. Diastolic phases were analyzed on a dedicated work station using MPR, MIP and VRT modes. The patient received 80 cc of contrast.  Aorta: Normal size. Minimal diffuse atherosclerotic plaque and calcifications. No dissection.  Aortic Valve:  Trileaflet.  No calcifications.  Coronary Arteries:  Normal coronary origin.  Right dominance.  RCA is a large dominant artery that gives rise to PDA and PLVB. There is minimal diffuse calcified plaque with stenosis 0-25%.  Left main is a short artery that gives rise to LAD and LCX arteries. Left main has no plaque.  LAD is a large vessel that gives rise to two small diagonal arteries. There is mild calcified plaque in the proximal and mid LAD with stenosis 25-49%.  LCX is a non-dominant artery that gives rise to one large OM1 branch. There is no plaque.  Other findings:  Normal pulmonary vein drainage into the left atrium.  A large left atrial appendage without an evidence for a thrombus.  Normal size of the pulmonary artery.  IMPRESSION: 1. Coronary calcium score of 34. This was 66 percentile for age and sex matched control.  2. Normal  coronary origin with right dominance.  3. Mild non-obstructive CAD, aggressive risk factor modification is recommended.  4. Pacemaker leads are seen in the right atrium and right ventricle.   Electronically Signed By: Tobias Alexander On: 04/10/2019 12:45  Narrative EXAM: OVER-READ INTERPRETATION  CT CHEST  The following report is an over-read performed by radiologist Dr. Charlett Nose  of [facility], [place] on 04/10/2019. This over-read does not include interpretation of cardiac or coronary anatomy or pathology. The coronary CTA interpretation by the cardiologist is attached.  COMPARISON:  None.  FINDINGS: Vascular: Heart is normal size. Aorta is normal caliber. Pacer wires noted in the right heart.  Mediastinum/Nodes: No adenopathy in the lower mediastinum or hila. Small hiatal hernia.  Lungs/Pleura: Visualized lungs clear.  No effusions.  Upper Abdomen: Imaging into the upper abdomen shows no acute findings.  Musculoskeletal: Chest wall soft tissues are unremarkable. No acute bony abnormality.  IMPRESSION: Small hiatal hernia.  No acute extra cardiac abnormality.  Electronically Signed: By: Charlett Nose M.D. On: 04/10/2019 12:33           Current Reported Medications:.    Current Meds  Medication Sig   atorvastatin (LIPITOR) 80 MG tablet Take 1 tablet (80 mg total) by mouth every evening.   glipiZIDE (GLUCOTROL) 5 MG tablet Take by mouth 2 (two) times daily before a meal.   glucose blood (ONETOUCH ULTRA) test strip 2 (two) times daily.   pantoprazole (PROTONIX) 40 MG tablet Take 1 tablet (40 mg total) by mouth 2 (two) times daily before a meal.   [DISCONTINUED] amLODipine (NORVASC) 2.5 MG tablet TAKE 1 TABLET(2.5 MG) BY MOUTH DAILY   [DISCONTINUED] apixaban (ELIQUIS) 5 MG TABS tablet TAKE 1 TABLET(5 MG) BY MOUTH TWICE DAILY   [DISCONTINUED] furosemide (LASIX) 20 MG tablet TAKE 1 TABLET(20 MG) BY MOUTH DAILY   [DISCONTINUED] potassium chloride SA  (KLOR-CON M) 20 MEQ tablet TAKE 1 TABLET(20 MEQ) BY MOUTH DAILY   Physical Exam:    VS:  BP 132/76   Pulse 79   Ht 5\' 6"  (1.676 m)   Wt 163 lb 3.2 oz (74 kg)   SpO2 98%   BMI 26.34 kg/m    Wt Readings from Last 3 Encounters:  08/02/23 163 lb 3.2 oz (74 kg)  08/05/22 159 lb (72.1 kg)  07/22/22 158 lb 6.4 oz (71.8 kg)    GEN: Well nourished, well developed in no acute distress NECK: No JVD; No carotid bruits CARDIAC: RRR, no murmurs, rubs, gallops RESPIRATORY:  Clear to auscultation without rales, wheezing or rhonchi  ABDOMEN: Soft, non-tender, non-distended EXTREMITIES:  Mild edema bilateral ankles; No acute deformity   Asessement and Plan:.    Chronic shortness of breath: She has a history of chronic shortness of breath with prior cardiac and pulmonary evaluation as noted above.  CPX in 09/2021 overall reassuring. She reports that her dyspnea on exertion feels that it has been worse the last few months. She denies orthopnea or pnd. Question if related to dehydration as she notes frequent episodes of vomiting and diarrhea over the summer, being worked up by [facility]. She appears euvolemic on exam, aside from some mild edema at her bilateral ankles below her varicose veins. Continue Lasix 20 mg daily and potassium chloride 20 mg daily. Check echo. Check CBC, CMET, and BNP.   Lower extremity edema: She reports history of lower extremity edema, varicose veins, neuropathy and venous insufficiency. She feels that her lower extremity edema has worsened in recent weeks. She also feels her neuropathy pain has worsened, she was unable to tolerate gabapentin. On exam she has mild bilateral ankle edema, located below her varicose veins. Likely edema is multifactorial, encouraged decreased sodium intake, leg elevation and compression stockings. With increased shortness of breath will check echocardiogram and BNP.   Mild nonobstructive CAD: Coronary CTA on 04/2019 showed calcium score  of 34, 42nd  percentile, mild nonobstructive CAD (0-25% RCA, 25-39% prox-mid LAD), small hiatal hernia. She denies chest pain, endorses some dyspnea on exertion as noted above. Reviewed ED precautions. Not on ASA given Eliquis. Not on beta-blocker due to history of sinus node dysfunction. Continue Eliquis 5 mg twice daily, amlodipine 2.5 mg daily, atorvastatin 80 mg daily, Lasix 20 mg daily, Losartan 50 mg daily. Check CBC, CMET and fasting lipid profile.   Paroxysmal atrial fibrillation: History of PAF noted on device. Device interogation 07/08/23 shows A-fib burden 8.6% of the time.  Episode was 4 hours and 26 minutes. EKG today shows atrial pacing. She denies feelings of palpitations or increased heart rate. Denies bleeding problems on Eliquis. Continue Eliquis 5 mg BID. Check CBC and CMET.  Hypertension: Initial blood pressure today 144/72, on recheck was 132/76. Continue antihypertensive regimen as noted above.  Hyperlipidemia: Last lipid profile on 07/17/2022 showed total cholesterol 157, triglycerides 147, HDL 42 and LDL 93. Continue atorvastatin 80 mg daily. Check fasting lipid profile today, if remains above goal will plan to refer to lipid clinic pharmD.    Hepatic steatosis: Noted to have steatosis on CT scan, currently undergoing workup by [facility] per patient. Check CMET.      Disposition: F/u with Dr. Ladona Ridgel on 08/16/23 as scheduled and Reather Littler, NP in three-four months.   Signed, Rip Harbour, NP

## 2023-08-02 ENCOUNTER — Encounter: Payer: Self-pay | Admitting: Cardiology

## 2023-08-02 ENCOUNTER — Ambulatory Visit: Payer: Medicare Other | Attending: Nurse Practitioner | Admitting: Cardiology

## 2023-08-02 VITALS — BP 132/76 | HR 79 | Ht 66.0 in | Wt 163.2 lb

## 2023-08-02 DIAGNOSIS — R0609 Other forms of dyspnea: Secondary | ICD-10-CM | POA: Insufficient documentation

## 2023-08-02 DIAGNOSIS — R600 Localized edema: Secondary | ICD-10-CM | POA: Diagnosis present

## 2023-08-02 DIAGNOSIS — I2510 Atherosclerotic heart disease of native coronary artery without angina pectoris: Secondary | ICD-10-CM | POA: Diagnosis not present

## 2023-08-02 DIAGNOSIS — I480 Paroxysmal atrial fibrillation: Secondary | ICD-10-CM | POA: Insufficient documentation

## 2023-08-02 DIAGNOSIS — I495 Sick sinus syndrome: Secondary | ICD-10-CM | POA: Diagnosis present

## 2023-08-02 LAB — CBC

## 2023-08-02 MED ORDER — FUROSEMIDE 20 MG PO TABS: ORAL_TABLET | ORAL | 3 refills | Status: DC

## 2023-08-02 MED ORDER — APIXABAN 5 MG PO TABS: ORAL_TABLET | ORAL | 11 refills | Status: DC

## 2023-08-02 MED ORDER — AMLODIPINE BESYLATE 2.5 MG PO TABS: ORAL_TABLET | ORAL | 3 refills | Status: DC

## 2023-08-02 MED ORDER — POTASSIUM CHLORIDE CRYS ER 20 MEQ PO TBCR: EXTENDED_RELEASE_TABLET | ORAL | 3 refills | Status: DC

## 2023-08-02 MED ORDER — LOSARTAN POTASSIUM 50 MG PO TABS: 50.0000 mg | ORAL_TABLET | Freq: Every day | ORAL | 3 refills | Status: DC

## 2023-08-02 NOTE — Patient Instructions (Signed)
Medication Instructions:  Your physician recommends that you continue on your current medications as directed. Please refer to the Current Medication list given to you today.  *If you need a refill on your cardiac medications before your next appointment, please call your pharmacy*   Lab Work: CBC, CMET, BNP, Fasting Lipid panel today.  Testing/Procedures: Your physician has requested that you have an echocardiogram. Echocardiography is a painless test that uses sound waves to create images of your heart. It provides your doctor with information about the size and shape of your heart and how well your heart's chambers and valves are working. This procedure takes approximately one hour. There are no restrictions for this procedure. Please do NOT wear cologne, perfume, aftershave, or lotions (deodorant is allowed). Please arrive 15 minutes prior to your appointment time.    Follow-Up: At [facility], you and your health needs are our priority.  As part of our continuing mission to provide you with exceptional heart care, we have created designated Provider Care Teams.  These Care Teams include your primary Cardiologist (physician) and Advanced Practice Providers (APPs -  Physician Assistants and Nurse Practitioners) who all work together to provide you with the care you need, when you need it.  We recommend signing up for the patient portal called "MyChart".  Sign up information is provided on this After Visit Summary.  MyChart is used to connect with patients for Virtual Visits (Telemedicine).  Patients are able to view lab/test results, encounter notes, upcoming appointments, etc.  Non-urgent messages can be sent to your provider as well.   To learn more about what you can do with MyChart, go to [email or web address].    Your next appointment:   3-4 month(s)  Provider:   Reather Littler, NP

## 2023-08-03 ENCOUNTER — Ambulatory Visit (INDEPENDENT_AMBULATORY_CARE_PROVIDER_SITE_OTHER): Payer: Medicare Other | Admitting: Podiatry

## 2023-08-03 DIAGNOSIS — M7741 Metatarsalgia, right foot: Secondary | ICD-10-CM | POA: Diagnosis not present

## 2023-08-03 DIAGNOSIS — E119 Type 2 diabetes mellitus without complications: Secondary | ICD-10-CM | POA: Diagnosis not present

## 2023-08-03 DIAGNOSIS — E1142 Type 2 diabetes mellitus with diabetic polyneuropathy: Secondary | ICD-10-CM | POA: Diagnosis not present

## 2023-08-03 DIAGNOSIS — Q6671 Congenital pes cavus, right foot: Secondary | ICD-10-CM | POA: Diagnosis not present

## 2023-08-03 DIAGNOSIS — M7742 Metatarsalgia, left foot: Secondary | ICD-10-CM

## 2023-08-03 DIAGNOSIS — Q6672 Congenital pes cavus, left foot: Secondary | ICD-10-CM

## 2023-08-03 LAB — COMPREHENSIVE METABOLIC PANEL
ALT: 25 [IU]/L (ref 0–32)
AST: 31 [IU]/L (ref 0–40)
Albumin: 4.3 g/dL (ref 3.8–4.8)
Alkaline Phosphatase: 108 [IU]/L (ref 44–121)
BUN/Creatinine Ratio: 21 (ref 12–28)
BUN: 14 mg/dL (ref 8–27)
Bilirubin Total: 0.6 mg/dL (ref 0.0–1.2)
CO2: 26 mmol/L (ref 20–29)
Calcium: 9.4 mg/dL (ref 8.7–10.3)
Chloride: 106 mmol/L (ref 96–106)
Creatinine, Ser: 0.68 mg/dL (ref 0.57–1.00)
Globulin, Total: 2.8 g/dL (ref 1.5–4.5)
Glucose: 106 mg/dL — ABNORMAL HIGH (ref 70–99)
Potassium: 4.3 mmol/L (ref 3.5–5.2)
Sodium: 144 mmol/L (ref 134–144)
Total Protein: 7.1 g/dL (ref 6.0–8.5)
eGFR: 88 mL/min/{1.73_m2} (ref >59)

## 2023-08-03 LAB — LIPID PANEL
Chol/HDL Ratio: 3.5 {ratio} (ref 0.0–4.4)
Cholesterol, Total: 163 mg/dL (ref 100–199)
HDL: 47 mg/dL (ref >39)
LDL Chol Calc (NIH): 92 mg/dL (ref 0–99)
Triglycerides: 134 mg/dL (ref 0–149)
VLDL Cholesterol Cal: 24 mg/dL (ref 5–40)

## 2023-08-03 LAB — CBC
Hematocrit: 41 % (ref 34.0–46.6)
Hemoglobin: 13.3 g/dL (ref 11.1–15.9)
MCH: 29.2 pg (ref 26.6–33.0)
MCHC: 32.4 g/dL (ref 31.5–35.7)
MCV: 90 fL (ref 79–97)
Platelets: 212 10*3/uL (ref 150–450)
RBC: 4.56 x10E6/uL (ref 3.77–5.28)
RDW: 12.8 % (ref 11.7–15.4)
WBC: 4.4 10*3/uL (ref 3.4–10.8)

## 2023-08-03 LAB — BRAIN NATRIURETIC PEPTIDE: BNP: 153.6 pg/mL — ABNORMAL HIGH (ref 0.0–100.0)

## 2023-08-03 MED ORDER — LIDOCAINE-PRILOCAINE 2.5-2.5 % EX CREA: 1.0000 | TOPICAL_CREAM | CUTANEOUS | 0 refills | Status: DC | PRN

## 2023-08-03 NOTE — Progress Notes (Signed)
  Subjective:  Patient ID: [name], female    DOB: [birth year],  MRN: [id number]  Chief Complaint  Patient presents with   Numbness    Diabetic foot check, neuropathy unable to tolerate medication. Also complaining of ankle pain    82 y.o. female presents with the above complaint. History confirmed with patient.  She returns for follow-up would like to have new diabetic shoes, previous ones were quite helpful.  Her diabetic peripheral neuropathy continues to be quite painful she feels that she is walking on needles in the feet are burning and numb  Objective:  Physical Exam: warm, good capillary refill, no trophic changes or ulcerative lesions, normal DP and PT pulses, abnormal sensory exam, she is grossly neuropathic, and flexible pes cavus foot type, there is tenderness and prominence of the second metatarsal head plantar bilateral.    Radiographs: Multiple views x-ray of both feet: no fracture, dislocation, swelling or degenerative changes noted and pes cavus foot type, no evidence of Charcot neuroarthropathy Assessment:   1. Diabetic peripheral neuropathy (HCC)   2. Pes cavus of both feet   3. Metatarsalgia of both feet   4. Type 2 diabetes mellitus without complication, without long-term current use of insulin (HCC)      Plan:  Patient was evaluated and treated and all questions answered.  Annual diabetic foot risk examination was performed she remains high risk due to her neuropathy and deformity.  I recommend she continue to utilize diabetic prescription shoes and she will be casted for new ones now.  Her diabetic peripheral neuropathy continues to worsen, she has not tolerated gabapentin and Lyrica due to adverse effects.  I recommended topical treatment with Emla cream and Rx was sent, we could consider a custom compounded cream if this is beneficial.  I also recommended consultation with Dr. Lorrine Kin for spinal cord stimulator to see if this is  beneficial.   Return if symptoms worsen or fail to improve.

## 2023-08-04 ENCOUNTER — Telehealth: Payer: Self-pay

## 2023-08-04 DIAGNOSIS — I2510 Atherosclerotic heart disease of native coronary artery without angina pectoris: Secondary | ICD-10-CM

## 2023-08-04 DIAGNOSIS — R600 Localized edema: Secondary | ICD-10-CM

## 2023-08-04 DIAGNOSIS — I10 Essential (primary) hypertension: Secondary | ICD-10-CM

## 2023-08-04 DIAGNOSIS — E785 Hyperlipidemia, unspecified: Secondary | ICD-10-CM

## 2023-08-04 NOTE — Telephone Encounter (Signed)
Cal with lab results. Patient states understanding of all information.  Referral placed Lab ordered  Please let [name] know that her CBC shows no evidence of anemia nor infection. Her kidney function and electrolytes are normal. Her liver enzymes are currently normal. Her LDL remains elevated, would recommend referral to lipid clinic as discussed during appointment. Her BNP is mildly elevated, this shows she is holding some fluid. Would recommend increasing her Lasix to 40 mg once daily for three days and increasing her potassium to 40 meq once daily for three days, she may then resume Lasix 20 mg daily and potassium 20 meq daily. Please have her check a BMET in one week. Thanks!

## 2023-08-04 NOTE — Telephone Encounter (Signed)
Patient wants a call back to discuss lab results and instructions.

## 2023-08-04 NOTE — Telephone Encounter (Signed)
Patient states she was in another doctor office and didn't think she got all information.  Repeated again all instructions and she states again that she understands

## 2023-08-11 ENCOUNTER — Ambulatory Visit
Payer: Medicare Other | Attending: Cardiovascular Disease | Admitting: Pharmacist Clinician (PhC)/ Clinical Pharmacy Specialist

## 2023-08-11 ENCOUNTER — Telehealth: Payer: Self-pay | Admitting: Pharmacist Clinician (PhC)/ Clinical Pharmacy Specialist

## 2023-08-11 ENCOUNTER — Other Ambulatory Visit (HOSPITAL_COMMUNITY): Payer: Self-pay

## 2023-08-11 ENCOUNTER — Telehealth: Payer: Self-pay | Admitting: Pharmacist

## 2023-08-11 ENCOUNTER — Encounter: Payer: Self-pay | Admitting: Pharmacist Clinician (PhC)/ Clinical Pharmacy Specialist

## 2023-08-11 DIAGNOSIS — I10 Essential (primary) hypertension: Secondary | ICD-10-CM | POA: Diagnosis not present

## 2023-08-11 DIAGNOSIS — I2510 Atherosclerotic heart disease of native coronary artery without angina pectoris: Secondary | ICD-10-CM

## 2023-08-11 DIAGNOSIS — I480 Paroxysmal atrial fibrillation: Secondary | ICD-10-CM | POA: Diagnosis not present

## 2023-08-11 DIAGNOSIS — E785 Hyperlipidemia, unspecified: Secondary | ICD-10-CM

## 2023-08-11 MED ORDER — REPATHA SURECLICK 140 MG/ML ~~LOC~~ SOAJ: 140.0000 mg | SUBCUTANEOUS | 12 refills | Status: DC

## 2023-08-11 NOTE — Addendum Note (Signed)
Addended by: Rosalee Kaufman on: 08/11/2023 04:10 PM   Modules accepted: Orders

## 2023-08-11 NOTE — Telephone Encounter (Signed)
Pharmacy Patient Advocate Encounter  Received notification from [facility] that Prior Authorization for Repatha SureClick 140MG /ML auto-injectors has been APPROVED from 08/10/2023 to 02/09/2024. Ran test claim, Copay is $141.27 (GAP). This test claim was processed through [facility]- copay amounts may vary at other pharmacies due to pharmacy/plan contracts, or as the patient moves through the different stages of their insurance plan.   PA #/Case ID/Reference #: [id number]

## 2023-08-11 NOTE — Patient Instructions (Signed)
Your Results:             Your most recent labs Goal  Total Cholesterol 163 < 200  Triglycerides 134 < 150  HDL (happy/good cholesterol) 47 > 40  LDL (lousy/bad cholesterol 92 < 55   Medication changes:  We will start the process to get Repatha covered by your insurance.  Once the prior authorization is complete, I will call/send a MyChart message to let you know and confirm pharmacy information.   You will take one injection every 14 days  Lab orders:  We want to repeat labs after 2-3 months.  We will send you a lab order to remind you once we get closer to that time.    Patient Assistance:    We will sign you up for a [employer] Grant once your medication is approved by [employer].  I will call you with the ID number, then you will take this information to the pharmacy.  They will bill it after your insurance, bringing your copay to $0.  The grant will pay the first $2,500 in a one year period.    ID   BIN [id number]  PCN [id number]  GRP [id number]    Thank you for choosing [facility]

## 2023-08-11 NOTE — Assessment & Plan Note (Signed)
Assessment: Patient with ASCVD/CVA not at LDL goal of < 55 Most recent LDL 92 on 08/02/23 Has been compliant with high intensity statin : atorvastatin 80  Reviewed options for lowering LDL cholesterol, including ezetimibe, PCSK-9 inhibitors, bempedoic acid and inclisiran.  Discussed mechanisms of action, dosing, side effects, potential decreases in LDL cholesterol and costs.  Also reviewed potential options for patient assistance.  Plan: Patient agreeable to starting Repatha 140 mg q14d Repeat labs after:  3 months Lipid Liver function Patient was given information on [employer] - will sign patient up when PA approved Marital status - married Income < $72,000 (single) or < $102,000 (married) - yes

## 2023-08-11 NOTE — Progress Notes (Signed)
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  CAD Non-obstructive, CAC = 34 (42nd percentile)  AF CHADS2-VASc = 8 (no CHF) on Eliquis     Allergies  Allergen Reactions   Citalopram     Other reaction(s): Serum Sickness (ALLERGY) Serotonin Syndrome   Codeine Other (See Comments)    hallucinations   Cymbalta [Duloxetine Hcl] Other (See Comments)    Serotonin Syndrome   Metformin And Related     Causes dehydration and causes me to go to the hospital   Paroxetine Hcl     Serotonin Syndrome   Trazodone And Nefazodone Other (See Comments)    Serotonin Syndrome   Caffeine Anxiety    Nervousness     History of Present Illness    [name] is a 82 y.o. female patient of Dr Swaziland, in the office today to discuss options for cholesterol management.  Insurance Carrier:  has Medicare and PDP, no medical supplement BIN [id number]    PCN [id number]    GRP [id number]    ID [id number]   LDL Cholesterol goal:  LDL < 55  Current Medications: atorvastatin 80 mg  Family Hx:  father - strokes MI;  daughter - strokes MI, DM (kidney transplant), mother unsure, had lung issues;  1 brother deceased at 87 - cancer, sister deceased at 52 - heart disease, another sister also at 7 - DM, multiple heath issues    Social Hx: Tobacco: no Alcohol: no    Caffeine: no  Diet:    mostly home cooked foods; not much of a meat eater, likes salmon, no pork, occasional beef or chicken; beans regularly, walnuts; steamed vegetables  Exercise: walks as able (has neuropathy)  Adherence Assessment  Do you ever forget to take your medication?  [] Yes [x] No  Do you ever skip doses due to side effects? [] Yes [x] No  Do you have trouble affording your medicines? [x] Yes - Elquis [] No  Are you ever unable to pick up your medication due to transportation difficulties? [] Yes [x] No   Adherence strategy: 30 day pill minder   BIN [id number] PCN [id number]  GRP PDPIND ID [id number]    Accessory Clinical Findings   Lab Results  Component Value Date   CHOL 163 08/02/2023   HDL 47 08/02/2023   LDLCALC 92 08/02/2023   TRIG 134 08/02/2023   CHOLHDL 3.5 08/02/2023    No results found for: "LIPOA"  Lab Results  Component Value Date   ALT 25 08/02/2023   AST 31 08/02/2023   ALKPHOS 108 08/02/2023   BILITOT 0.6 08/02/2023   Lab Results  Component Value Date   CREATININE 0.68 08/02/2023   BUN 14 08/02/2023   NA 144 08/02/2023   K 4.3 08/02/2023   CL 106 08/02/2023   CO2 26 08/02/2023   Lab Results  Component Value Date   HGBA1C 5.8 (H) 05/10/2016    Home Medications    Current Outpatient Medications  Medication Sig Dispense Refill   amLODipine (NORVASC) 2.5 MG tablet TAKE 1 TABLET(2.5 MG) BY MOUTH DAILY 90 tablet 3   apixaban (ELIQUIS) 5 MG TABS tablet TAKE  1 TABLET(5 MG) BY MOUTH TWICE DAILY 60 tablet 11   atorvastatin (LIPITOR) 80 MG tablet Take 1 tablet (80 mg total) by mouth every evening. 30 tablet 1   furosemide (LASIX) 20 MG tablet TAKE 1 TABLET(20 MG) BY MOUTH DAILY 90 tablet 3   glipiZIDE (GLUCOTROL) 5 MG tablet Take by mouth 2 (two) times daily before a meal.     glucose blood (ONETOUCH ULTRA) test strip 2 (two) times daily.     lidocaine-prilocaine (EMLA) cream Apply 1 Application topically as needed. 30 g 0   losartan (COZAAR) 50 MG tablet Take 1 tablet (50 mg total) by mouth daily. 90 tablet 3   pantoprazole (PROTONIX) 40 MG tablet Take 1 tablet (40 mg total) by mouth 2 (two) times daily before a meal. 60 tablet 2   potassium chloride SA (KLOR-CON M) 20 MEQ tablet TAKE 1 TABLET(20 MEQ) BY MOUTH DAILY 90 tablet 3    No current facility-administered medications for this visit.     Assessment & Plan    Dyslipidemia Assessment: Patient with ASCVD/CVA not at LDL goal of < 55 Most recent LDL 92 on 08/02/23 Has been compliant with high intensity statin : atorvastatin 80  Reviewed options for lowering LDL cholesterol, including ezetimibe, PCSK-9 inhibitors, bempedoic acid and inclisiran.  Discussed mechanisms of action, dosing, side effects, potential decreases in LDL cholesterol and costs.  Also reviewed potential options for patient assistance.  Plan: Patient agreeable to starting Repatha 140 mg q14d Repeat labs after:  3 months Lipid Liver function Patient was given information on [employer] - will sign patient up when PA approved Marital status - married Income < $72,000 (single) or < $102,000 (married) - yes    PAF (paroxysmal atrial fibrillation) (HCC) Patient with AF currently on Eliquis, has some difficulty in paying for medication in coverage gap.  Reviewed option for dabigatran, on her plan would be ~ $70/month in initial phase (40% of cost), and ~ $40 in coverage gap.  She will think about this, but states she should get through the remainder of the year with her Eliquis as is.     Phillips Hay, PharmD CPP [facility] [address]  [place], [place] [address] [phone]  08/11/2023, 10:13 AM

## 2023-08-11 NOTE — Telephone Encounter (Signed)
Spoke with patient.  She will get medication for now, (in coverage gap) and we will add her to list of patients qualifying for [employer].

## 2023-08-11 NOTE — Assessment & Plan Note (Signed)
Patient with AF currently on Eliquis, has some difficulty in paying for medication in coverage gap.  Reviewed option for dabigatran, on her plan would be ~ $70/month in initial phase (40% of cost), and ~ $40 in coverage gap.  She will think about this, but states she should get through the remainder of the year with her Eliquis as is.

## 2023-08-11 NOTE — Telephone Encounter (Signed)
Please do PA for Repatha.  PDP card information in my office note from today

## 2023-08-11 NOTE — Telephone Encounter (Signed)
Pharmacy Patient Advocate Encounter   Received notification from CoverMyMeds that prior authorization for Repatha SureClick 140MG /ML auto-injectors is required/requested.   Insurance verification completed.   The patient is insured through [facility] .   Per test claim: PA required; PA submitted to [facility] via CoverMyMeds Key/confirmation #/EOC [id number] Status is pending

## 2023-08-12 LAB — BASIC METABOLIC PANEL
BUN/Creatinine Ratio: 21 (ref 12–28)
BUN: 17 mg/dL (ref 8–27)
CO2: 23 mmol/L (ref 20–29)
Calcium: 9.2 mg/dL (ref 8.7–10.3)
Chloride: 105 mmol/L (ref 96–106)
Creatinine, Ser: 0.8 mg/dL (ref 0.57–1.00)
Glucose: 120 mg/dL — ABNORMAL HIGH (ref 70–99)
Potassium: 4.3 mmol/L (ref 3.5–5.2)
Sodium: 143 mmol/L (ref 134–144)
eGFR: 74 mL/min/{1.73_m2} (ref >59)

## 2023-08-14 ENCOUNTER — Other Ambulatory Visit: Payer: Self-pay | Admitting: Podiatry

## 2023-08-16 ENCOUNTER — Ambulatory Visit: Payer: Medicare Other | Attending: Internal Medicine | Admitting: Internal Medicine

## 2023-08-16 ENCOUNTER — Encounter: Payer: Self-pay | Admitting: Internal Medicine

## 2023-08-16 ENCOUNTER — Ambulatory Visit: Payer: Medicare Other

## 2023-08-16 VITALS — BP 136/68 | HR 73 | Ht 66.0 in | Wt 158.0 lb

## 2023-08-16 DIAGNOSIS — E119 Type 2 diabetes mellitus without complications: Secondary | ICD-10-CM

## 2023-08-16 DIAGNOSIS — I495 Sick sinus syndrome: Secondary | ICD-10-CM | POA: Diagnosis present

## 2023-08-16 DIAGNOSIS — M7741 Metatarsalgia, right foot: Secondary | ICD-10-CM

## 2023-08-16 DIAGNOSIS — Q6671 Congenital pes cavus, right foot: Secondary | ICD-10-CM

## 2023-08-16 MED ORDER — FUROSEMIDE 20 MG PO TABS: ORAL_TABLET | ORAL | 3 refills | Status: DC

## 2023-08-16 MED ORDER — APIXABAN 5 MG PO TABS: 5.0000 mg | ORAL_TABLET | Freq: Two times a day (BID) | ORAL | 0 refills | Status: DC

## 2023-08-16 NOTE — Patient Instructions (Signed)
Medication Instructions:  Your physician has recommended you make the following change in your medication:  If blood pressure is below 110 do not take lasix. If blood pressure is 110/130 take 20 mg of lasix. If blood pressure is above 130 take 40 mg of lasix.  *If you need a refill on your cardiac medications before your next appointment, please call your pharmacy*  WALK everyday Lab Work: None ordered.  If you have labs (blood work) drawn today and your tests are completely normal, you will receive your results only by: MyChart Message (if you have MyChart) OR A paper copy in the mail If you have any lab test that is abnormal or we need to change your treatment, we will call you to review the results.  Testing/Procedures: None ordered.  Follow-Up: At [facility], you and your health needs are our priority.  As part of our continuing mission to provide you with exceptional heart care, we have created designated Provider Care Teams.  These Care Teams include your primary Cardiologist (physician) and Advanced Practice Providers (APPs -  Physician Assistants and Nurse Practitioners) who all work together to provide you with the care you need, when you need it.   Your next appointment:   1 year(s)  The format for your next appointment:   In Person  Provider:   Lewayne Bunting, MD{or one of the following Advanced Practice Providers on your designated Care Team:   Francis Dowse, [place] Casimiro Needle "Mardelle Matte" By[place], [place] Earnest Rosier, NP  Remote monitoring is used to monitor your Pacemaker/ ICD from home. This monitoring reduces the number of office visits required to check your device to one time per year. It allows [place] to keep an eye on the functioning of your device to ensure it is working properly.  Important Information About Sugar

## 2023-08-16 NOTE — Progress Notes (Signed)
Patient presents to the office today for diabetic shoe and insole measuring.  Patient was measured with brannock device to determine size and width for 1 pair of extra depth shoes and foam casted for 3 pair of insoles.   Documentation of medical necessity will be sent to patient's treating diabetic doctor to verify and sign.   Patient's diabetic provider: Brett Fairy MD   Shoes and insoles will be ordered at that time and patient will be notified for an appointment for fitting when they arrive.   Shoe size (per patient): 8.5 Brannock measurement: 8.5 Patient shoe selection- Shoe choice:   894 / 892 Shoe size ordered: 8.54M Financials abn signed

## 2023-08-16 NOTE — Progress Notes (Addendum)
HPI The patient returns today for followup of HTN, sob, and PPM due to sinus node dysfunction. She denies sodium indiscretion. No chest pain. Her dyspnea has improved. No edema. Her husband has developed some dementia. She denies peripheral edema. Her edema is improved.    Allergies  Allergen Reactions   Citalopram     Other reaction(s): Serum Sickness (ALLERGY) Serotonin Syndrome   Codeine Other (See Comments)    hallucinations   Cymbalta [Duloxetine Hcl] Other (See Comments)    Serotonin Syndrome   Metformin And Related     Causes dehydration and causes me to go to the hospital   Paroxetine Hcl     Serotonin Syndrome   Trazodone And Nefazodone Other (See Comments)    Serotonin Syndrome   Caffeine Anxiety    Nervousness      Current Outpatient Medications  Medication Sig Dispense Refill   amLODipine (NORVASC) 2.5 MG tablet TAKE 1 TABLET(2.5 MG) BY MOUTH DAILY 90 tablet 3   apixaban (ELIQUIS) 5 MG TABS tablet TAKE 1 TABLET(5 MG) BY MOUTH TWICE DAILY 60 tablet 11   apixaban (ELIQUIS) 5 MG TABS tablet Take 1 tablet (5 mg total) by mouth 2 (two) times daily. 28 tablet 0   atorvastatin (LIPITOR) 80 MG tablet Take 1 tablet (80 mg total) by mouth every evening. 30 tablet 1   Evolocumab (REPATHA SURECLICK) 140 MG/ML SOAJ Inject 140 mg into the skin every 14 (fourteen) days. 2 mL 12   furosemide (LASIX) 20 MG tablet If blood pressure is below 110 do not take lasix. If blood pressure is 110-130 take 1 tablet (20 mg) of lasix. If blood pressure is above 130 take 2 tablets (40 mg) of lasix. 120 tablet 3   glipiZIDE (GLUCOTROL) 5 MG tablet Take by mouth 2 (two) times daily before a meal.     glucose blood (ONETOUCH ULTRA) test strip 2 (two) times daily.     lidocaine-prilocaine (EMLA) cream Apply 1 Application topically as needed. 30 g 0   losartan (COZAAR) 50 MG tablet Take 1 tablet (50 mg total) by mouth daily. 90 tablet 3   pantoprazole (PROTONIX) 40 MG tablet Take 1 tablet (40  mg total) by mouth 2 (two) times daily before a meal. 60 tablet 2   potassium chloride SA (KLOR-CON M) 20 MEQ tablet TAKE 1 TABLET(20 MEQ) BY MOUTH DAILY 90 tablet 3   No current facility-administered medications for this visit.     Past Medical History:  Diagnosis Date   Allergy    Arthritis    CAD (coronary artery disease)    a. mild by prior cath; coronary CT 04/2019.   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   Past Surgical History:  Procedure Laterality Date   ABDOMINAL HYSTERECTOMY     APPENDECTOMY     bil carple tunnel surgery     CARDIAC CATHETERIZATION N/A 04/09/2015   Procedure: Left Heart Cath and Coronary Angiography;  Surgeon: Peter M Swaziland, MD;  Location: [facility] INVASIVE CV LAB;  Service: Cardiovascular;  Laterality: N/A;   COLONOSCOPY  multiple   EP IMPLANTABLE DEVICE N/A 04/09/2016   Procedure: PPM Generator Changeout;  Surgeon: Marinus Maw, MD;  Location: [facility] INVASIVE CV LAB;  Service: Cardiovascular;  Laterality: N/A;   ESOPHAGEAL MANOMETRY N/A 08/02/2017   Procedure: ESOPHAGEAL MANOMETRY (EM);  Surgeon: Napoleon Form, MD;  Location: [facility];  Service: Endoscopy;  Laterality: N/A;   KNEE SURGERY     right x 2   NECK SURGERY     x2   PACEMAKER INSERTION     PH IMPEDANCE  STUDY N/A 08/02/2017   Procedure: PH IMPEDANCE STUDY;  Surgeon: Napoleon Form, MD;  Location: [facility];  Service: Endoscopy;  Laterality: N/A;   UPPER GASTROINTESTINAL ENDOSCOPY       Family History  Problem Relation Age of Onset   Coronary artery disease Father    Prostate cancer Father    Colon cancer Father    Lung cancer Father    Diabetes Father    Heart attack Father    Diabetes Daughter    Dementia Daughter    Colon cancer Sister        x 2   Lung cancer Mother    Dementia Mother    Colon polyps Sister    Crohn's disease Sister    Diabetes Maternal Aunt        multiple   Breast cancer Maternal Aunt    Colon cancer Brother    Kidney disease Daughter    Diabetes Cousin        multiple   Diabetes Maternal Uncle        multiple   Irritable bowel syndrome Sister    Heart attack Daughter    Heart attack Brother    Hypertension Neg Hx    Esophageal cancer Neg Hx    Rectal cancer Neg Hx    Stomach cancer Neg Hx      Social History   Socioeconomic History   Marital status: Married    Spouse name: james   Number of children: 2   Years of education: 12   Highest education level: Not on file  Occupational History   Occupation: Retired    Associate Professor: RETIRED  Tobacco Use   Smoking status: Never   Smokeless tobacco: Never  Vaping Use   Vaping status: Never Used  Substance and Sexual Activity   Alcohol use: No    Alcohol/week: 0.0 standard drinks of alcohol   Drug use: No   Sexual activity: Not Currently  Other Topics Concern   Not on file  Social History Narrative   Patient is married with 2 children. She lives at home with her husband.    Patient is right handed.   Patient has 12 th grade education.   Patient drinks occasional caffeine.   Social Determinants of Health   Financial Resource Strain: Medium Risk (07/10/2022)   [facility] to [facility]  [facility] visits prior to 01/02/2023.   Overall Financial Resource Strain (CARDIA)    Difficulty of Paying Living Expenses: Somewhat hard  Food Insecurity: Low Risk  (08/04/2023)  [facility]   Hunger Vital Sign    Worried About Running Out of Food in the Last Year: Never true    Ran Out of Food in the Last Year: Never true  Transportation Needs: No Transportation Needs (08/04/2023)   Received from [facility]    In the past 12 months, has lack of reliable transportation kept you from medical appointments, meetings, work or from getting things needed for daily living? : No  Physical Activity: Insufficiently Active (07/10/2022)   [facility]   Exercise Vital Sign  Stress: No Stress Concern Present (07/10/2022)   [facility], [facility] prior to 01/02/2023., [facility] visits prior to 01/02/2023.   [employer] of Occupational Health - Occupational Stress Questionnaire    Feeling of Stress : Only a little  Social Connections: Socially Integrated (07/10/2022)   [facility], [facility] prior to 01/02/2023., [facility] visits prior to 01/02/2023.   Social Advertising account executive [NHANES]    Frequency of Communication with Friends and Family: Once a week    Frequency of Social Gatherings with Friends and Family: Twice a week    Attends Religious Services: More than 4 times per year    Active Member of [employer] or Organizations: Yes    Attends Banker Meetings: More than 4 times per year    Marital Status: Married  Catering manager Violence: Not At Risk (07/10/2022)   [facility] prior to 01/02/2023., [facility] visits prior to 01/02/2023.   Humiliation, Afraid, Rape, and Kick questionnaire    Fear of Current  or Ex-Partner: No    Emotionally Abused: No    Physically Abused: No    Sexually Abused: No     BP 136/68   Pulse 73   Ht 5\' 6"  (1.676 m)   Wt 158 lb (71.7 kg)   SpO2 99%   BMI 25.50 kg/m   Physical Exam:  Well appearing NAD HEENT: Unremarkable Neck:  No JVD, no thyromegally Lymphatics:  No adenopathy Back:  No CVA tenderness Lungs:  Clear HEART:  Regular rate rhythm, no murmurs, no rubs, no clicks Abd:  soft, positive bowel sounds, no organomegally, no rebound, no guarding Ext:  2 plus pulses, no edema, no cyanosis, no clubbing Skin:  No rashes no nodules Neuro:  CN II through XII intact, motor grossly intact  DEVICE  Normal device function.  See PaceArt for details.   Assess/Plan:  Sinus node dysfunction - she is asymptomatic, s/p PPM insertion.  2.  HTN - her blood pressure is better. She will continue her dose of losartan at 50 mg daily and norvasc.   3. PPM - her medtronic DDD PM is working normally.  4. Dyspnea - I suspect that this is multifactorial but is improved on lasix. She will maintain a low sodium diet. I asked her to take lasix 40 mg daily for sbp above 130.    Leonia Reeves.D.

## 2023-08-25 ENCOUNTER — Ambulatory Visit (HOSPITAL_COMMUNITY): Payer: Medicare Other | Attending: Internal Medicine

## 2023-08-25 DIAGNOSIS — R0609 Other forms of dyspnea: Secondary | ICD-10-CM | POA: Diagnosis present

## 2023-08-25 DIAGNOSIS — I10 Essential (primary) hypertension: Secondary | ICD-10-CM | POA: Diagnosis present

## 2023-08-25 LAB — ECHOCARDIOGRAM COMPLETE
Area-P 1/2: 3.87 cm2
MV M vel: 5.37 m/s
MV Peak grad: 115.3 mm[Hg]
Radius: 0.45 cm
S' Lateral: 2.9 cm

## 2023-08-26 ENCOUNTER — Telehealth: Payer: Self-pay

## 2023-08-26 DIAGNOSIS — I10 Essential (primary) hypertension: Secondary | ICD-10-CM

## 2023-08-26 NOTE — Telephone Encounter (Signed)
-----   Message from Rip Harbour sent at 08/25/2023  5:35 PM EDT ----- Please let [name] know that her echocardiogram shows normal heart squeeze and function. The echocardiogram suggested there is some stiffening of the heart muscle, which is what we call diastolic dysfunction. In addition to good blood pressure control and achieving or maintaining a healthy weight, would advise to generally stick to lower sodium diet (aiming for maximum 2,000mg  per day) and staying hydrated but not to excess. In general about 64oz per day of all fluid intake would be an ideal maximum. Would recommend increasing Lasix to 40 mg daily with a recheck in BMET in two weeks. Monitor daily weights and notify the office of weight gain of 2-3lbs or overnight or 5 lbs in a week. Continue to monitor blood pressure and notify office if systolic consistently less than 110. Follow up as planned.

## 2023-08-26 NOTE — Telephone Encounter (Signed)
Spoke to patient and below results relayed. Patient report last time she was on an increased dose of Lasix she was seeing spots and felt like she was going to pass out. She stated she was seen by Dr Ladona Ridgel 10/14 and he put her on sliding scale Lasix 20 mg instructions listed below. Please advised for Lasix instructions.   If blood pressure is below 110 do not take lasix. If blood pressure is 110-130 take 1 tablet (20 mg) of lasix. If blood pressure is above 130 take 2 tablets (40 mg) of lasix.

## 2023-08-26 NOTE — Telephone Encounter (Signed)
Spoke to patient and advised to continue to take Lasix as instructed by Dr Ladona Ridgel per Reather Littler, NP.

## 2023-08-31 ENCOUNTER — Telehealth: Payer: Self-pay | Admitting: *Deleted

## 2023-08-31 NOTE — Telephone Encounter (Signed)
   Pre-operative Risk Assessment    Patient Name: [name]  DOB: 1941/01/10 MRN: [id number]  DATE OF LAST VISIT: 08/16/23 DR. Ladona Ridgel DATE OF NEXT VISIT: 11/01/23 Reather Littler, NP    Request for Surgical Clearance    Procedure:   SCS TRIAL -SPINAL CORD STIMULATOR TRIAL  Date of Surgery:  Clearance TBD                                 Surgeon:  DR. [facility] Surgeon's Group or Practice Name:  [facility] Phone number:  [phone] Fax number:  [phone]   Type of Clearance Requested:   - Medical  - Pharmacy:  Hold Apixaban (Eliquis) 3 DAYS PRIOR; PT CAN RESUME WHEN LEADS ARE PULLED AFTER 5-7 DAYS   Type of Anesthesia:  Not Indicated   Additional requests/questions:    Elpidio Anis   08/31/2023, 10:52 AM

## 2023-08-31 NOTE — Telephone Encounter (Signed)
Patient with diagnosis of afib on Eliquis for anticoagulation.    Procedure: spinal cord stimulator trial  Date of procedure: TBD  CHA2DS2-VASc Score = 8  This indicates a 10.8% annual risk of stroke. The patient's score is based upon: CHF History: 0 HTN History: 1 Diabetes History: 1 Stroke History: 2 Vascular Disease History: 1 Age Score: 2 Gender Score: 1   TIA occurred in 2017.  CrCl 60mL/min Platelet count 212K  This is a long anticoag hold request of 3 days prior to procedure with pt to stay off anticoag until 5-7 days post-procedure. She is at elevated cardiovascular risk off anticoag with elevated CHADS2VASc score including prior TIA. Will need MD clearance for pt to be off Eliquis for ~10 days.  **This guidance is not considered finalized until pre-operative APP has relayed final recommendations.**

## 2023-08-31 NOTE — Telephone Encounter (Signed)
Pharmacy please advise on holding Eliquis prior to spinal cord stimulator trial scheduled for TBD. Thank you.

## 2023-09-01 ENCOUNTER — Encounter: Payer: Self-pay | Admitting: Pharmacist

## 2023-09-01 ENCOUNTER — Telehealth: Payer: Self-pay | Admitting: Pharmacist

## 2023-09-01 NOTE — Telephone Encounter (Signed)
Called pt to enroll her in [facility] grant for her Repatha, info sent via [employer]. Pt appreciative for the assistance.

## 2023-09-07 NOTE — Telephone Encounter (Signed)
Dr. Ladona Ridgel,  You saw this patient on 08/16/2023. Per office protocol, will you please comment on medical clearance for spinal cord stimulator trial?   Pharmacy would also like your input on holding Eliquis: "This is a long anticoag hold request of 3 days prior to procedure with pt to stay off anticoag until 5-7 days post-procedure. She is at elevated cardiovascular risk off anticoag with elevated CHADS2VASc score including prior TIA. Will need MD clearance for pt to be off Eliquis for ~10 days."   If you could please provide recommendations regarding holding Eliquis.  Please route your response to P CV DIV Preop. I will communicate with requesting office once you have given recommendations.   Thank you!  Carlos Levering, NP

## 2023-09-09 ENCOUNTER — Other Ambulatory Visit: Payer: Self-pay

## 2023-09-09 DIAGNOSIS — I10 Essential (primary) hypertension: Secondary | ICD-10-CM

## 2023-09-10 LAB — BASIC METABOLIC PANEL
BUN/Creatinine Ratio: 20 (ref 12–28)
BUN: 18 mg/dL (ref 8–27)
CO2: 26 mmol/L (ref 20–29)
Calcium: 9.2 mg/dL (ref 8.7–10.3)
Chloride: 104 mmol/L (ref 96–106)
Creatinine, Ser: 0.88 mg/dL (ref 0.57–1.00)
Glucose: 120 mg/dL — ABNORMAL HIGH (ref 70–99)
Potassium: 4.2 mmol/L (ref 3.5–5.2)
Sodium: 142 mmol/L (ref 134–144)
eGFR: 66 mL/min/{1.73_m2} (ref >59)

## 2023-09-20 NOTE — Telephone Encounter (Signed)
Dr. Ladona Ridgel,   You saw this patient on 08/16/2023. Per office protocol, will you please comment on medical clearance for spinal cord stimulator trial?

## 2023-10-04 NOTE — Telephone Encounter (Signed)
   Primary Cardiologist: Peter Swaziland, MD  Chart reviewed as part of pre-operative protocol coverage. Given past medical history and time since last visit, based on ACC/AHA guidelines, [name] would be at acceptable risk for the planned procedure without further cardiovascular testing.   Patient was advised that if she develops new symptoms prior to surgery to contact our office to arrange a follow-up appointment.  He verbalized understanding.  Per office protocol, he may hold Eliquis  for 3 days prior to procedure and hold until leads are pulled 5-7 days after. She should resume as soon as hemodynamically stable postoperatively.  I will route this recommendation to the requesting party via Epic fax function and remove from pre-op pool.  Please call with questions.  Levi Aland, NP-C 10/04/2023, 8:01 AM [address] Office [phone] Fax [phone]

## 2023-10-06 ENCOUNTER — Ambulatory Visit (INDEPENDENT_AMBULATORY_CARE_PROVIDER_SITE_OTHER): Payer: Medicare Other

## 2023-10-06 DIAGNOSIS — Q6672 Congenital pes cavus, left foot: Secondary | ICD-10-CM

## 2023-10-06 DIAGNOSIS — M7742 Metatarsalgia, left foot: Secondary | ICD-10-CM

## 2023-10-06 DIAGNOSIS — Q6671 Congenital pes cavus, right foot: Secondary | ICD-10-CM | POA: Diagnosis not present

## 2023-10-06 DIAGNOSIS — E11610 Type 2 diabetes mellitus with diabetic neuropathic arthropathy: Secondary | ICD-10-CM | POA: Diagnosis not present

## 2023-10-06 DIAGNOSIS — M7741 Metatarsalgia, right foot: Secondary | ICD-10-CM

## 2023-10-06 DIAGNOSIS — E119 Type 2 diabetes mellitus without complications: Secondary | ICD-10-CM

## 2023-10-06 DIAGNOSIS — E1142 Type 2 diabetes mellitus with diabetic polyneuropathy: Secondary | ICD-10-CM | POA: Diagnosis not present

## 2023-10-06 NOTE — Progress Notes (Signed)

## 2023-10-07 ENCOUNTER — Ambulatory Visit (INDEPENDENT_AMBULATORY_CARE_PROVIDER_SITE_OTHER): Payer: Medicare Other

## 2023-10-07 DIAGNOSIS — I495 Sick sinus syndrome: Secondary | ICD-10-CM

## 2023-10-07 LAB — CUP PACEART REMOTE DEVICE CHECK
Battery Remaining Longevity: 29 mo
Battery Voltage: 2.94 V
Brady Statistic AP VP Percent: 0.31 %
Brady Statistic AP VS Percent: 96.38 %
Brady Statistic AS VP Percent: 0.93 %
Brady Statistic AS VS Percent: 2.38 %
Brady Statistic RA Percent Paced: 96.27 %
Brady Statistic RV Percent Paced: 1.25 %
Date Time Interrogation Session: 20241205082554
Implantable Lead Connection Status: 753985
Implantable Lead Connection Status: 753985
Implantable Lead Implant Date: 20070910
Implantable Lead Implant Date: 20070910
Implantable Lead Location: 753859
Implantable Lead Location: 753860
Implantable Lead Model: 5076
Implantable Lead Model: 5076
Implantable Pulse Generator Implant Date: 20170608
Lead Channel Impedance Value: 304 Ohm
Lead Channel Impedance Value: 380 Ohm
Lead Channel Impedance Value: 399 Ohm
Lead Channel Impedance Value: 418 Ohm
Lead Channel Pacing Threshold Amplitude: 0.75 V
Lead Channel Pacing Threshold Amplitude: 0.875 V
Lead Channel Pacing Threshold Pulse Width: 0.4 ms
Lead Channel Pacing Threshold Pulse Width: 0.4 ms
Lead Channel Sensing Intrinsic Amplitude: 1.25 mV
Lead Channel Sensing Intrinsic Amplitude: 1.25 mV
Lead Channel Sensing Intrinsic Amplitude: 11.125 mV
Lead Channel Sensing Intrinsic Amplitude: 11.125 mV
Lead Channel Setting Pacing Amplitude: 2 V
Lead Channel Setting Pacing Amplitude: 2.5 V
Lead Channel Setting Pacing Pulse Width: 0.4 ms
Lead Channel Setting Sensing Sensitivity: 2.8 mV
Zone Setting Status: 755011
Zone Setting Status: 755011

## 2023-10-21 ENCOUNTER — Other Ambulatory Visit: Payer: Self-pay | Admitting: Podiatry

## 2023-10-31 NOTE — Progress Notes (Unsigned)
Cardiology Office Note    Date:  11/01/2023  ID:  [name] [id number], MRN [id number] PCP:  Ashley Found, MD  Cardiologist:  Peter Swaziland, MD  Electrophysiologist:  Lewayne Bunting, MD   Chief Complaint: Follow-up for shortness of breath  History of Present Illness: [name]    [name] is a 82 y.o. female with visit-pertinent history of hypertension, sinus node dysfunction s/p MDT PPM (generator change in 2017), migraines, diabetes mellitus type 2, fibromyalgia, hyperlipidemia, CVA, TIAs, hiatal hernia, nonobstructive CAD, PAF on Eliquis.    [name] has prior history of evaluation for shortness of breath and chest pain.  Cardiac workup was previously unrevealing she had cardiac cath in/2016 that showed 10% proximal RCA and 25 send proximal LAD with normal LVEF and normal LVEDP.  Evaluated by Dr. Sherene Sires 2020 for evaluation of shortness of breath and nocturnal wheezing which he felt were out of proportion to objective pulmonary findings, possibly related to deconditioning and potentially her anxiety/depression.  Her labs were overall unrevealing including a normal D-dimer and BNP.  She was previously started on Imdur in case there was a component of microvascular angina.  Coronary CTA in 04/2019 calcium score of 34, 42nd percentile, mild nonobstructive CAD (0-25% RCA, 25-39% prox-mid LAD), small hiatal hernia.  Device interrogation was unrevealing at that time.  She reported improvement on Imdur but then reported some leg swelling.  Echo 05/08/19 showed LVEF 60 to 65%, normal diastolic function despite mild LA dilation, mildly increased LV wall thickness, normal RV, mild LAE/RAE.  Her HCTZ and amlodipine were adjusted with improvement, following this she stopped Imdur.  In 2021 her device interrogation indicated new onset atrial fibrillation. Plavix was discontinued and she was started on Eliquis.  In 09/2020 she underwent a CPX which was normal for age, with  hypertensive BP response and suggestion of diastolic dysfunction with exercise.   She was seen by Dr. Swaziland on 08/05/2022, she remained stable from a cardiac perspective at that time.  Reported that her breathing was at baseline.  At office visit on 08/02/2023 she reported that she was doing well although she did endorse some dyspnea on exertion that she felt had gotten worse in recent weeks she also endorsed lower extremity edema that worsened throughout the day and improved overnight. She was last seen in clinic by Dr. Ladona Ridgel on 08/16/23, her shortness of breath had improved.  She followed up with PharmD for for lipid management and was started on Repatha.   Today she presents for follow up. She reports that she is doing well. She feels her breathing and her lower extremity edema is at baseline.  She has noted significant improvement in her lower extremity edema in recent weeks.  She continues to work to decrease her salt intake and elevate her lower extremities when able.  She is planning to start Silver sneakers in the new year with her new insurance.  Labwork independently reviewed: 09/09/2023: Sodium 142, potassium 4.2, creatinine 0.88 ROS: .   Today she denies chest pain, fatigue, palpitations, melena, hematuria, hemoptysis, diaphoresis, weakness, presyncope, syncope, orthopnea, and PND.  All other systems are reviewed and otherwise negative. Studies Reviewed: [name]    EKG:  EKG is not ordered today.  CV Studies:  Cardiac Studies & Procedures   CARDIAC CATHETERIZATION  CARDIAC CATHETERIZATION 04/09/2015  Narrative  Prox RCA lesion, 10% stenosed.  Prox LAD lesion, 25% stenosed.  1. Nonobstructive CAD- mild 2. Normal LV function 3. Normal LVEDP  Plan: extensive cardiac evaluation has been negative. Will stop ACE inhibitor and try an ARB. If symptoms do not improve will need pulmonary evaluation.  Findings Coronary Findings Diagnostic  Dominance: Right  Left Main The vessel , is  normal in caliber is angiographically normal.  Left Anterior Descending discrete .  Left Circumflex The vessel , is normal in caliber is angiographically normal.  Right Coronary Artery discrete .  Intervention  No interventions have been documented.   STRESS TESTS  MYOCARDIAL PERFUSION IMAGING 04/02/2015  Narrative  Myocardial perfusion is abnormal. Moderate size and intensity, reversible anterior perfusion defect. Findings consistent with ischemia. This is an intermediate risk study. Overall left ventricular systolic function was normal. LV cavity size is normal. The left ventricular ejection fraction is hyperdynamic (>65%). There is no prior study for comparison.  ECHOCARDIOGRAM  ECHOCARDIOGRAM COMPLETE 08/25/2023  Narrative ECHOCARDIOGRAM REPORT    Patient Name:   [name] Date of Exam: 08/25/2023 Medical Rec #:  [id number]                 Height:       66.0 in Accession #:    [id number]                Weight:       158.0 lb Date of Birth:  10-20-1941                 BSA:          1.809 m Patient Age:    82 years                  BP:           133/74 mmHg Patient Gender: F                         HR:           60 bpm. Exam Location:  [address]  Procedure: 2D Echo, 3D Echo, Cardiac Doppler, Color Doppler and Strain Analysis  Indications:    R06.00 SOB  History:        Patient has prior history of Echocardiogram examinations, most recent 05/11/2016. CAD, TIA and Stroke; Risk Factors:Hypertension, Diabetes and HLD.  Sonographer:    Clearence Ped RCS Referring Phys: [id number] Salvador Coupe D Alonah Lineback  IMPRESSIONS   1. Left ventricular ejection fraction, by estimation, is 60 to 65%. Left ventricular ejection fraction by 3D volume is 60 %. The left ventricle has normal function. The left ventricle has no regional wall motion abnormalities. Left ventricular diastolic parameters are consistent with Grade II diastolic dysfunction (pseudonormalization). Elevated  left ventricular end-diastolic pressure. The E/e' is 15. 2. Right ventricular systolic function is normal. The right ventricular size is normal. There is normal pulmonary artery systolic pressure. The estimated right ventricular systolic pressure is 25.1 mmHg. 3. Left atrial size was mildly dilated. 4. The mitral valve is grossly normal. Mild mitral valve regurgitation. 5. The aortic valve is tricuspid. Aortic valve regurgitation is not visualized. Aortic valve sclerosis is present, with no evidence of aortic valve stenosis. 6. The inferior vena cava is normal in size with greater than 50% respiratory variability, suggesting right atrial pressure of 3 mmHg.  Comparison(s): Changes from prior study are noted. 05/08/2019: LVEF 60-65%, normal diastolic function.  FINDINGS Left Ventricle: Left ventricular ejection fraction, by estimation, is 60 to 65%. Left ventricular ejection fraction by 3D volume is 60 %. The left ventricle has normal function.  The left ventricle has no regional wall motion abnormalities. Global longitudinal strain performed but not reported based on interpreter judgement due to suboptimal tracking. The left ventricular internal cavity size was normal in size. There is no left ventricular hypertrophy. Left ventricular diastolic parameters are consistent with Grade II diastolic dysfunction (pseudonormalization). Elevated left ventricular end-diastolic pressure. The E/e' is 15.  Right Ventricle: The right ventricular size is normal. No increase in right ventricular wall thickness. Right ventricular systolic function is normal. There is normal pulmonary artery systolic pressure. The tricuspid regurgitant velocity is 2.35 m/s, and with an assumed right atrial pressure of 3 mmHg, the estimated right ventricular systolic pressure is 25.1 mmHg.  Left Atrium: Left atrial size was mildly dilated.  Right Atrium: Right atrial size was normal in size.  Pericardium: There is no evidence of  pericardial effusion.  Mitral Valve: The mitral valve is grossly normal. Mild mitral valve regurgitation.  Tricuspid Valve: The tricuspid valve is grossly normal. Tricuspid valve regurgitation is trivial.  Aortic Valve: The aortic valve is tricuspid. Aortic valve regurgitation is not visualized. Aortic valve sclerosis is present, with no evidence of aortic valve stenosis.  Pulmonic Valve: The pulmonic valve was normal in structure. Pulmonic valve regurgitation is not visualized.  Aorta: The aortic root and ascending aorta are structurally normal, with no evidence of dilitation.  Venous: The inferior vena cava is normal in size with greater than 50% respiratory variability, suggesting right atrial pressure of 3 mmHg.  IAS/Shunts: No atrial level shunt detected by color flow Doppler.  Additional Comments: A device lead is visualized.   LEFT VENTRICLE PLAX 2D LVIDd:         4.00 cm         Diastology LVIDs:         2.90 cm         LV e' medial:    6.42 cm/s LV PW:         1.30 cm         LV E/e' medial:  15.3 LV IVS:        1.00 cm         LV e' lateral:   14.40 cm/s LVOT diam:     1.80 cm         LV E/e' lateral: 6.8 LV SV:         53 LV SV Index:   30              2D LVOT Area:     2.54 cm        Longitudinal Strain 2D Strain GLS  -14.4 % (A2C): 2D Strain GLS  -20.8 % (A3C): 2D Strain GLS  -16.1 % (A4C): 2D Strain GLS  -17.1 % Avg:  3D Volume EF LV 3D EF:    Left ventricul ar ejection fraction by 3D volume is 60 %.  3D Volume EF: 3D EF:        60 % LV EDV:       83 ml LV ESV:       33 ml LV SV:        50 ml  RIGHT VENTRICLE RV Basal diam:  3.30 cm RVSP:           25.1 mmHg  LEFT ATRIUM           Index        RIGHT ATRIUM           Index LA diam:  4.50 cm 2.49 cm/m   RA Pressure: 3.00 mmHg LA Vol (A2C): 65.5 ml 36.18 ml/m  RA Area:     17.70 cm LA Vol (A4C): 32.6 ml 18.02 ml/m  RA Volume:   47.10 ml  26.03 ml/m AORTIC VALVE LVOT Vmax:   95.40  cm/s LVOT Vmean:  61.300 cm/s LVOT VTI:    0.210 m  AORTA Ao Root diam: 2.90 cm Ao Asc diam:  2.70 cm  MITRAL VALVE                  TRICUSPID VALVE MV Area (PHT):                TR Peak grad:   22.1 mmHg MV Decel Time:                TR Vmax:        235.00 cm/s MR Peak grad:    115.3 mmHg   Estimated RAP:  3.00 mmHg MR Mean grad:    77.0 mmHg    RVSP:           25.1 mmHg MR Vmax:         537.00 cm/s MR Vmean:        416.0 cm/s   SHUNTS MR PISA:         1.27 cm     Systemic VTI:  0.21 m MR PISA Eff ROA: 7 mm        Systemic Diam: 1.80 cm MR PISA Radius:  0.45 cm MV E velocity: 98.00 cm/s MV A velocity: 61.30 cm/s MV E/A ratio:  1.60  Zoila Shutter MD Electronically signed by Zoila Shutter MD Signature Date/Time: 08/25/2023/1:51:13 PM    Final    CT SCANS  CT CORONARY MORPH W/CTA COR W/SCORE 04/10/2019  Addendum 04/10/2019 12:48 PM ADDENDUM REPORT: 04/10/2019 12:45  CLINICAL DATA:  82 year old female with h/o diabetes, hypertension, hyperlipidemia, s/p PM placement now with dyspnea on exertion.  EXAM: Cardiac/Coronary  CT  TECHNIQUE: The patient was scanned on a [employer].  FINDINGS: A 120 kV prospective scan was triggered in the descending thoracic aorta at 111 HU's. Axial non-contrast 3 mm slices were carried out through the heart. The data set was analyzed on a dedicated work station and scored using the Agatson method. Gantry rotation speed was 250 msecs and collimation was .6 mm. No beta blockade and 0.8 mg of sl NTG was given. The 3D data set was reconstructed in 5% intervals of the 67-82 % of the R-R cycle. Diastolic phases were analyzed on a dedicated work station using MPR, MIP and VRT modes. The patient received 80 cc of contrast.  Aorta: Normal size. Minimal diffuse atherosclerotic plaque and calcifications. No dissection.  Aortic Valve:  Trileaflet.  No calcifications.  Coronary Arteries:  Normal coronary origin.  Right  dominance.  RCA is a large dominant artery that gives rise to PDA and PLVB. There is minimal diffuse calcified plaque with stenosis 0-25%.  Left main is a short artery that gives rise to LAD and LCX arteries. Left main has no plaque.  LAD is a large vessel that gives rise to two small diagonal arteries. There is mild calcified plaque in the proximal and mid LAD with stenosis 25-49%.  LCX is a non-dominant artery that gives rise to one large OM1 branch. There is no plaque.  Other findings:  Normal pulmonary vein drainage into the left atrium.  A large left atrial appendage without an evidence for a  thrombus.  Normal size of the pulmonary artery.  IMPRESSION: 1. Coronary calcium score of 34. This was 26 percentile for age and sex matched control.  2. Normal coronary origin with right dominance.  3. Mild non-obstructive CAD, aggressive risk factor modification is recommended.  4. Pacemaker leads are seen in the right atrium and right ventricle.   Electronically Signed By: Tobias Alexander On: 04/10/2019 12:45  Narrative EXAM: OVER-READ INTERPRETATION  CT CHEST  The following report is an over-read performed by radiologist Dr. Charlett Nose of [facility] on 04/10/2019. This over-read does not include interpretation of cardiac or coronary anatomy or pathology. The coronary CTA interpretation by the cardiologist is attached.  COMPARISON:  None.  FINDINGS: Vascular: Heart is normal size. Aorta is normal caliber. Pacer wires noted in the right heart.  Mediastinum/Nodes: No adenopathy in the lower mediastinum or hila. Small hiatal hernia.  Lungs/Pleura: Visualized lungs clear.  No effusions.  Upper Abdomen: Imaging into the upper abdomen shows no acute findings.  Musculoskeletal: Chest wall soft tissues are unremarkable. No acute bony abnormality.  IMPRESSION: Small hiatal hernia.  No acute extra cardiac abnormality.  Electronically Signed: By:  Charlett Nose M.D. On: 04/10/2019 12:33          Current Reported Medications:.    Current Meds  Medication Sig   amLODipine (NORVASC) 2.5 MG tablet TAKE 1 TABLET(2.5 MG) BY MOUTH DAILY   apixaban (ELIQUIS) 5 MG TABS tablet TAKE 1 TABLET(5 MG) BY MOUTH TWICE DAILY   atorvastatin (LIPITOR) 80 MG tablet Take 1 tablet (80 mg total) by mouth every evening.   Evolocumab (REPATHA SURECLICK) 140 MG/ML SOAJ Inject 140 mg into the skin every 14 (fourteen) days.   furosemide (LASIX) 20 MG tablet If blood pressure is below 110 do not take lasix. If blood pressure is 110-130 take 1 tablet (20 mg) of lasix. If blood pressure is above 130 take 2 tablets (40 mg) of lasix.   glipiZIDE (GLUCOTROL) 5 MG tablet Take by mouth 2 (two) times daily before a meal.   glucose blood (ONETOUCH ULTRA) test strip 2 (two) times daily.   lidocaine-prilocaine (EMLA) cream APPLY 1 APPLICATION TOPICALLY AS NEEDED   losartan (COZAAR) 50 MG tablet Take 1 tablet (50 mg total) by mouth daily.   pantoprazole (PROTONIX) 40 MG tablet Take 1 tablet (40 mg total) by mouth 2 (two) times daily before a meal.   potassium chloride SA (KLOR-CON M) 20 MEQ tablet TAKE 1 TABLET(20 MEQ) BY MOUTH DAILY    Physical Exam:    VS:  BP 124/69 (BP Location: Left Arm, Patient Position: Sitting, Cuff Size: Normal)   Pulse 73   Ht 5\' 6"  (1.676 m)   Wt 163 lb (73.9 kg)   SpO2 96%   BMI 26.31 kg/m    Wt Readings from Last 3 Encounters:  11/01/23 163 lb (73.9 kg)  08/16/23 158 lb (71.7 kg)  08/02/23 163 lb 3.2 oz (74 kg)    GEN: Well nourished, well developed in no acute distress NECK: No JVD; No carotid bruits CARDIAC: RRR, no murmurs, rubs, gallops RESPIRATORY:  Clear to auscultation without rales, wheezing or rhonchi  ABDOMEN: Soft, non-tender, non-distended EXTREMITIES:  No edema; No acute deformity   Asessement and Plan:.    Chronic shortness of breath: History of chronic shortness of breath with prior cardiac and pulmonary  evaluation as noted above.  CPX in 09/2021 overall reassuring. At last OV in 07/2023 she reported increased DOE, BNP was mildly  elevated at 153.6.  Echo on 08/25/2023 indicated LVEF of 60 to 65%, grade 2 diastolic dysfunction.  Today she reports that her breathing is at baseline, Dr. Ladona Ridgel started her on sliding scale insulin which she has noted significant improvement on, will continue.  Lower extremity edema: Patient with history of lower extremity edema, varicose veins, neuropathy and venous insufficiency. At last OV noted some worsening lower extremity edema. Dr. Ladona Ridgel started her on sliding scale lasix. Echo on 08/25/2023 indicated LVEF of 60 to 65%, grade 2 diastolic dysfunction. Today she reports mild lower extremity edema that she feels is her baseline.  She feels that her shortness of breath is also at baseline.  Denies orthopnea or PND. Encouraged decreased sodium intake, leg elevation and compression stockings.  Continue Lasix 20 mg daily with additional 20 mg if systolic blood pressure is above 130.  Mild nonobstructive CAD: Coronary CTA on 04/2019 showed calcium score of 34, 42nd percentile, mild nonobstructive CAD (0-25% RCA, 25-39% prox-mid LAD), small hiatal hernia. She denies chest pain, endorses some dyspnea on exertion as noted above. Reviewed ED precautions. Not on ASA given Eliquis. Not on beta-blocker due to history of sinus node dysfunction. Continue Eliquis 5 mg twice daily, amlodipine 2.5 mg daily, atorvastatin 80 mg daily, Lasix 20 mg daily, Losartan 50 mg daily.   Paroxysmal atrial fibrillation: History of PAF noted on device. Device interogation 10/07/2023 showed 1 AF/AFL events lasting 26 hours in duration, controlled rates, burden 2.1%. She denies feelings of palpitations or increased heart rate. Denies bleeding problems on Eliquis. Continue Eliquis 5 mg BID.   Hypertension: Blood pressure today 124/69. Continue current antihypertensive regimen as noted above.   Hyperlipidemia:  Last lipid profile on 08/02/23 indicated LDL 92. Referred to pharmD lipid clinic, now on Repatha.     Disposition: F/u with Dr. Swaziland  Signed, Rip Harbour, NP

## 2023-11-01 ENCOUNTER — Encounter: Payer: Self-pay | Admitting: Cardiology

## 2023-11-01 ENCOUNTER — Ambulatory Visit: Payer: Medicare Other | Attending: Cardiology | Admitting: Cardiology

## 2023-11-01 VITALS — BP 124/69 | HR 73 | Ht 66.0 in | Wt 163.0 lb

## 2023-11-01 DIAGNOSIS — I480 Paroxysmal atrial fibrillation: Secondary | ICD-10-CM | POA: Diagnosis present

## 2023-11-01 DIAGNOSIS — I2510 Atherosclerotic heart disease of native coronary artery without angina pectoris: Secondary | ICD-10-CM | POA: Diagnosis not present

## 2023-11-01 DIAGNOSIS — E785 Hyperlipidemia, unspecified: Secondary | ICD-10-CM | POA: Diagnosis present

## 2023-11-01 DIAGNOSIS — I10 Essential (primary) hypertension: Secondary | ICD-10-CM

## 2023-11-01 DIAGNOSIS — R600 Localized edema: Secondary | ICD-10-CM | POA: Diagnosis not present

## 2023-11-01 DIAGNOSIS — R0609 Other forms of dyspnea: Secondary | ICD-10-CM | POA: Diagnosis not present

## 2023-11-01 DIAGNOSIS — I495 Sick sinus syndrome: Secondary | ICD-10-CM | POA: Diagnosis present

## 2023-11-01 NOTE — Patient Instructions (Signed)
 Medication Instructions:  No changes *If you need a refill on your cardiac medications before your next appointment, please call your pharmacy*  Lab Work: No labs  Testing/Procedures: No testing  Follow-Up: At [facility], you and your health needs are our priority.  As part of our continuing mission to provide you with exceptional heart care, we have created designated Provider Care Teams.  These Care Teams include your primary Cardiologist (physician) and Advanced Practice Providers (APPs -  Physician Assistants and Nurse Practitioners) who all work together to provide you with the care you need, when you need it.  We recommend signing up for the patient portal called "MyChart".  Sign up information is provided on this After Visit Summary.  MyChart is used to connect with patients for Virtual Visits (Telemedicine).  Patients are able to view lab/test results, encounter notes, upcoming appointments, etc.  Non-urgent messages can be sent to your provider as well.   To learn more about what you can do with MyChart, go to [email or web address].    Your next appointment:   6 month(s)  Provider:   Peter Swaziland, MD

## 2023-11-08 ENCOUNTER — Encounter: Payer: Self-pay | Admitting: Pharmacist Clinician (PhC)/ Clinical Pharmacy Specialist

## 2023-11-08 DIAGNOSIS — E785 Hyperlipidemia, unspecified: Secondary | ICD-10-CM

## 2023-11-09 LAB — HEPATIC FUNCTION PANEL
ALT: 25 [IU]/L (ref 0–32)
AST: 31 [IU]/L (ref 0–40)
Albumin: 4.2 g/dL (ref 3.8–4.8)
Alkaline Phosphatase: 114 [IU]/L (ref 44–121)
Bilirubin Total: 1 mg/dL (ref 0.0–1.2)
Bilirubin, Direct: 0.42 mg/dL — ABNORMAL HIGH (ref 0.00–0.40)
Total Protein: 7 g/dL (ref 6.0–8.5)

## 2023-11-09 LAB — LIPID PANEL
Chol/HDL Ratio: 1.4 {ratio} (ref 0.0–4.4)
Cholesterol, Total: 76 mg/dL — ABNORMAL LOW (ref 100–199)
HDL: 55 mg/dL (ref >39)
LDL Chol Calc (NIH): 6 mg/dL (ref 0–99)
Triglycerides: 65 mg/dL (ref 0–149)
VLDL Cholesterol Cal: 15 mg/dL (ref 5–40)

## 2023-11-09 NOTE — Telephone Encounter (Signed)
 Patient is following up requesting to speak with PharmD regarding MyChart message.

## 2023-11-24 ENCOUNTER — Other Ambulatory Visit (HOSPITAL_COMMUNITY): Payer: Self-pay

## 2023-11-24 ENCOUNTER — Telehealth: Payer: Self-pay | Admitting: Pharmacy Technician

## 2023-11-24 NOTE — Telephone Encounter (Signed)
Pharmacy Patient Advocate Encounter   Received notification from Fax that prior authorization for Repatha 140mg /ml is required/requested.   Insurance verification completed.   The patient is insured through [facility] ADVANTAGE/RX ADVANCE .   Per test claim: Refill too soon. PA is not needed at this time. Medication was filled 11/15/23. Next eligible fill date is 12/06/23.

## 2023-11-30 ENCOUNTER — Other Ambulatory Visit (HOSPITAL_COMMUNITY): Payer: Self-pay

## 2023-12-02 ENCOUNTER — Other Ambulatory Visit (HOSPITAL_COMMUNITY): Payer: Self-pay

## 2023-12-02 ENCOUNTER — Telehealth: Payer: Self-pay | Admitting: Pharmacy Technician

## 2023-12-02 ENCOUNTER — Telehealth: Payer: Self-pay | Admitting: Cardiology

## 2023-12-02 NOTE — Telephone Encounter (Signed)
Pt c/o medication issue:  1. Name of Medication: Evolocumab (REPATHA SURECLICK) 140 MG/ML SOAJ   2. How are you currently taking this medication (dosage and times per day)? 140 mg, Every 14 days   3. Are you having a reaction (difficulty breathing--STAT)? No  4. What is your medication issue? PA needed per pt's letter she received

## 2023-12-02 NOTE — Telephone Encounter (Signed)
Pharmacy Patient Advocate Encounter   Received notification from Pt Calls Messages that prior authorization for repatha is required/requested.   Insurance verification completed.   The patient is insured through [facility] ADVANTAGE/RX ADVANCE .   Per test claim: PA required; PA submitted to above mentioned insurance via Fax Key/confirmation #/EOC faxed Status is pending

## 2023-12-06 ENCOUNTER — Other Ambulatory Visit (HOSPITAL_COMMUNITY): Payer: Self-pay

## 2023-12-06 NOTE — Telephone Encounter (Signed)
Patient notified PA approved for Repath.

## 2023-12-06 NOTE — Telephone Encounter (Signed)
Pharmacy Patient Advocate Encounter  Received notification from [facility] ADVANTAGE/RX ADVANCE that Prior Authorization for repatha has been APPROVED from 12/04/23 to 05/31/24   PA #/Case ID/Reference #: [id number]

## 2024-01-06 ENCOUNTER — Ambulatory Visit (INDEPENDENT_AMBULATORY_CARE_PROVIDER_SITE_OTHER): Payer: Medicare Other

## 2024-01-06 DIAGNOSIS — I495 Sick sinus syndrome: Secondary | ICD-10-CM | POA: Diagnosis not present

## 2024-01-09 LAB — CUP PACEART REMOTE DEVICE CHECK
Battery Remaining Longevity: 27 mo
Battery Voltage: 2.94 V
Brady Statistic AP VP Percent: 0.47 %
Brady Statistic AP VS Percent: 97.34 %
Brady Statistic AS VP Percent: 0.02 %
Brady Statistic AS VS Percent: 2.17 %
Brady Statistic RA Percent Paced: 97.75 %
Brady Statistic RV Percent Paced: 0.5 %
Date Time Interrogation Session: 20250307092739
Implantable Lead Connection Status: 753985
Implantable Lead Connection Status: 753985
Implantable Lead Implant Date: 20070910
Implantable Lead Implant Date: 20070910
Implantable Lead Location: 753859
Implantable Lead Location: 753860
Implantable Lead Model: 5076
Implantable Lead Model: 5076
Implantable Pulse Generator Implant Date: 20170608
Lead Channel Impedance Value: 304 Ohm
Lead Channel Impedance Value: 380 Ohm
Lead Channel Impedance Value: 380 Ohm
Lead Channel Impedance Value: 399 Ohm
Lead Channel Pacing Threshold Amplitude: 0.75 V
Lead Channel Pacing Threshold Amplitude: 0.875 V
Lead Channel Pacing Threshold Pulse Width: 0.4 ms
Lead Channel Pacing Threshold Pulse Width: 0.4 ms
Lead Channel Sensing Intrinsic Amplitude: 1 mV
Lead Channel Sensing Intrinsic Amplitude: 1 mV
Lead Channel Sensing Intrinsic Amplitude: 9.5 mV
Lead Channel Sensing Intrinsic Amplitude: 9.5 mV
Lead Channel Setting Pacing Amplitude: 2 V
Lead Channel Setting Pacing Amplitude: 2.5 V
Lead Channel Setting Pacing Pulse Width: 0.4 ms
Lead Channel Setting Sensing Sensitivity: 2.8 mV
Zone Setting Status: 755011
Zone Setting Status: 755011

## 2024-01-11 ENCOUNTER — Encounter: Payer: Self-pay | Admitting: Internal Medicine

## 2024-02-11 NOTE — Progress Notes (Signed)
 Remote pacemaker transmission.

## 2024-02-11 NOTE — Addendum Note (Signed)
 Addended by: Elease Etienne A on: 02/11/2024 08:48 AM   Modules accepted: Orders

## 2024-04-06 ENCOUNTER — Ambulatory Visit (INDEPENDENT_AMBULATORY_CARE_PROVIDER_SITE_OTHER): Payer: Medicare Other

## 2024-04-06 DIAGNOSIS — I495 Sick sinus syndrome: Secondary | ICD-10-CM | POA: Diagnosis not present

## 2024-04-10 ENCOUNTER — Telehealth: Payer: Self-pay

## 2024-04-10 LAB — CUP PACEART REMOTE DEVICE CHECK
Battery Remaining Longevity: 24 mo
Battery Voltage: 2.92 V
Brady Statistic AP VP Percent: 0.47 %
Brady Statistic AP VS Percent: 90.76 %
Brady Statistic AS VP Percent: 2.25 %
Brady Statistic AS VS Percent: 6.52 %
Brady Statistic RA Percent Paced: 90.14 %
Brady Statistic RV Percent Paced: 2.7 %
Date Time Interrogation Session: 20250606163457
Implantable Lead Connection Status: 753985
Implantable Lead Connection Status: 753985
Implantable Lead Implant Date: 20070910
Implantable Lead Implant Date: 20070910
Implantable Lead Location: 753859
Implantable Lead Location: 753860
Implantable Lead Model: 5076
Implantable Lead Model: 5076
Implantable Pulse Generator Implant Date: 20170608
Lead Channel Impedance Value: 285 Ohm
Lead Channel Impedance Value: 380 Ohm
Lead Channel Impedance Value: 399 Ohm
Lead Channel Impedance Value: 418 Ohm
Lead Channel Pacing Threshold Amplitude: 0.875 V
Lead Channel Pacing Threshold Amplitude: 0.875 V
Lead Channel Pacing Threshold Pulse Width: 0.4 ms
Lead Channel Pacing Threshold Pulse Width: 0.4 ms
Lead Channel Sensing Intrinsic Amplitude: 0.75 mV
Lead Channel Sensing Intrinsic Amplitude: 0.75 mV
Lead Channel Sensing Intrinsic Amplitude: 11.875 mV
Lead Channel Sensing Intrinsic Amplitude: 11.875 mV
Lead Channel Setting Pacing Amplitude: 2 V
Lead Channel Setting Pacing Amplitude: 2.5 V
Lead Channel Setting Pacing Pulse Width: 0.4 ms
Lead Channel Setting Sensing Sensitivity: 2.8 mV
Zone Setting Status: 755011
Zone Setting Status: 755011

## 2024-04-10 NOTE — Telephone Encounter (Signed)
 PPM Scheduled remote reviewed. Normal device function.   Presenting rhythm:  AFL-VS, occasional VP.  11 AHR detections, burden 5.7% (increased from previous <0.1%), current episode in progress since 6/2, episode EGMs consistent with AFL.  Known history of PAF, on Eliquis  per Epic.  Patient has been in AF for > 7 days, known PAF.  Also noted increased V rates during AF on HG's.  Need to call patient and assess symptoms.  She isn't due for follow up with GT until October.

## 2024-04-11 NOTE — Progress Notes (Unsigned)
 Electrophysiology Office Note:    Date:  04/12/2024   ID:  [name], Douglas August 14, 1941, MRN [id number]  PCP:  Audria Leather, MD   [facility] Providers Cardiologist:  Peter Swaziland, MD Electrophysiologist:  Manya Sells, MD     Referring MD: Audria Leather, MD   History of Present Illness:    [name] is a 83 y.o. female with a medical history significant for Medtronic pacemaker placement for sick sinus syndrome, paroxysmal atrial fibrillation, nonobstructive coronary disease referred for urgent (DOD) follow-up due to an ongoing atrial high rate on her device and new CHF symptoms.      Discussed the use of AI scribe software for clinical note transcription with the patient, who gave verbal consent to proceed.  History of Present Illness [name] is an 83 year old female with sick sinus syndrome and paroxysmal atrial fibrillation who presents with increased episodes of atrial fibrillation and associated symptoms. She typically follows with Dr. Carolynne Citron.  She has experienced increased episodes of atrial fibrillation over the past few days, accompanied by dizziness, nausea, and a sensation of impending syncope. She also reports worsening balance, necessitating support while walking.   Her medical history includes sick sinus syndrome managed with a Medtronic pacemaker and paroxysmal atrial fibrillation, and she has been on Eliquis  since the 2021. Her atrial fibrillation episodes have been intermittent, with a recent increase in frequency such that it is nearly persistent. A remote pacemaker check conducted a few days ago showed an ongoing episode that prompted today's urgent visit. Interrogation today shows that the AF since converted to sinus but recurred again this AM.  She is on a low dosage of Lasix , adjusted to a sliding scale due to episodes of near syncope when taking a double dose. She experiences significant ankle  swelling.[name]  Her social history includes caring for her 48 year old husband, which she finds stressful but not a new responsibility. During the review of symptoms, she reports shortness of breath and dizziness, particularly during exertion. A friend at church noted she appeared pale, described as 'white as a sheet.'         Today, She reports that she is fatigued and shortness of breath.  EKGs/Labs/Other Studies Reviewed Today:     Echocardiogram:  TTE October 2024 LVEF 60 to 65%.  Grade 2 diastolic dysfunction.  Mildly dilated left atrium.    EKG:   EKG Interpretation Date/Time:  Wednesday April 12 2024 10:27:16 EDT Ventricular Rate:  73 PR Interval:    QRS Duration:  68 QT Interval:  420 QTC Calculation: 462 R Axis:   2  Text Interpretation: Atrial fibrillation with frequent ventricular-paced complexes ST & T wave abnormality, consider anterior ischemia When compared with ECG of 02-Aug-2023 10:34, Atrial fibrillation is now present Confirmed by [name] [phone]) on 04/12/2024 10:50:43 AM     Physical Exam:    VS:  BP 122/66   Pulse 73   Ht 5' 5.5 (1.664 m)   Wt 165 lb 6.4 oz (75 kg)   SpO2 97%   BMI 27.11 kg/m     Wt Readings from Last 3 Encounters:  04/12/24 165 lb 6.4 oz (75 kg)  11/01/23 163 lb (73.9 kg)  08/16/23 158 lb (71.7 kg)     GEN: Well nourished, well developed in no acute distress CARDIAC: RRR, no murmurs, rubs, gallops RESPIRATORY:  Normal work of breathing MUSCULOSKELETAL: no edema    ASSESSMENT & PLAN:     Atrial Fibrillation Increased  burden over the past few days correlating with dizziness, nausea, dependent edema. AF appears to have terminated then recurred this AM -- I don't think DCCV will provide durable benefit without AAD. Her V-rate has declined with AF Will increase lower rate limit today,  I think we will need to add medication for rhythm control but will assess response to improved V-rate over next few days before  clouding the issue with multiple simultaneous interventions Will check BMP today anticipating starting Tikosyn  CHF with preserved ejection fraction Exacerbated by EF Increased diuretic use has been complicated by lightheadedness/dizziness I advised she continue with her diuretic schedule with extra doses as prescribed for accumulating fluid I advised her to elevate her legs and use compression stockings.  Secondary hypercoagulable state Continue apixaban  5mg  PO BID   Signed, Efraim Grange, MD  04/12/2024 11:14 AM    [facility]

## 2024-04-11 NOTE — Telephone Encounter (Signed)
 Patient is experiencing significant SOB recently with uptick in Aflutter events, more fatigue and weakness and notices increased signs of fluid retention to her legs/feet.   Taking all of her medications, no missed doses.   Dr. Arlester Ladd has opening tomorrow and is DOD.  Dr. Carolynne Citron out of the office.  Patient is to see Dr. Arlester Ladd tomorrow at 1045 to evaluate burden and v rate control.

## 2024-04-12 ENCOUNTER — Encounter: Payer: Self-pay | Admitting: Cardiovascular Disease

## 2024-04-12 ENCOUNTER — Ambulatory Visit: Attending: Cardiovascular Disease | Admitting: Cardiovascular Disease

## 2024-04-12 VITALS — BP 122/66 | HR 73 | Ht 65.5 in | Wt 165.4 lb

## 2024-04-12 DIAGNOSIS — R600 Localized edema: Secondary | ICD-10-CM | POA: Diagnosis not present

## 2024-04-12 DIAGNOSIS — R0602 Shortness of breath: Secondary | ICD-10-CM

## 2024-04-12 DIAGNOSIS — I10 Essential (primary) hypertension: Secondary | ICD-10-CM | POA: Diagnosis not present

## 2024-04-12 NOTE — Patient Instructions (Addendum)
 Medication Instructions:  Your physician recommends that you continue on your current medications as directed. Please refer to the Current Medication list given to you today. *If you need a refill on your cardiac medications before your next appointment, please call your pharmacy*  Labs: BMP and TSH - today - please have lab work completed at [facility] on the first floor of our building   Follow-Up: At [facility], you and your health needs are our priority.  As part of our continuing mission to provide you with exceptional heart care, our providers are all part of one team.  This team includes your primary Cardiologist (physician) and Advanced Practice Providers or APPs (Physician Assistants and Nurse Practitioners) who all work together to provide you with the care you need, when you need it.  Your next appointment:   1 week(s)  Provider:   You will see one of the following Advanced Practice Providers on your designated Care Team:   Mertha Abrahams, PA-C Michael Andy Tillery, PA-C Suzann Riddle, NP Creighton Doffing, NP

## 2024-04-13 LAB — BASIC METABOLIC PANEL WITH GFR
BUN/Creatinine Ratio: 16 (ref 12–28)
BUN: 14 mg/dL (ref 8–27)
CO2: 19 mmol/L — ABNORMAL LOW (ref 20–29)
Calcium: 9.3 mg/dL (ref 8.7–10.3)
Chloride: 104 mmol/L (ref 96–106)
Creatinine, Ser: 0.9 mg/dL (ref 0.57–1.00)
Glucose: 134 mg/dL — ABNORMAL HIGH (ref 70–99)
Potassium: 4.2 mmol/L (ref 3.5–5.2)
Sodium: 143 mmol/L (ref 134–144)
eGFR: 64 mL/min/{1.73_m2} (ref >59)

## 2024-04-13 LAB — TSH RFX ON ABNORMAL TO FREE T4: TSH: 3.66 u[IU]/mL (ref 0.450–4.500)

## 2024-04-14 ENCOUNTER — Ambulatory Visit: Payer: Self-pay | Admitting: Cardiovascular Disease

## 2024-04-14 ENCOUNTER — Ambulatory Visit: Payer: Self-pay | Admitting: Internal Medicine

## 2024-04-19 NOTE — Progress Notes (Unsigned)
 fatig   Cardiology Office Note    Date:  04/21/2024   ID:  [name], [name] August 31, 1941, MRN [id number]  PCP:  Audria Leather, MD  Cardiologist:  Raistlin Gum [place], MD  Electrophysiologist:  Manya Sells, MD   Chief Complaint: f/u SOB  History of Present Illness:   [name] is a 82 y.o. female with history of HTN, sinus node dysfunction s/p MDT PPM (gen change 2017), migraines, DM, fibromyalgia, HLD, CVA, TIAs, hiatal hernia, nonobstructive CAD, PAF, RLS, chronic edema who presents for follow-up.   She has prior history of evalulation for SOB and chest pain. Cardiac workup was previously unrevealing. Cath 04/2015 showed 10% prox RCA and 25% prox LAD with normal LVEF and normal LVEDP. She then saw Dr. Waymond Hailey in 2020 for evaluation of SOB and nocturnal wheezing which he felt were out of proportion to objective pulmonary findings, possibly related to deconditioning and potentially anxiety/depression. Labs were unrevealing including normal d-dimer and BNP.  Coronary CT 04/2019 which showed showed calcium  score of 34 (42nd percentile), mild nonobstructive CAD (0-25% RCA, 25-39% prox-mid LAD), small hiatal hernia. Device interrogation was unrevealing at that time.  Echo 05/08/19 showed EF 60-65%, normal diastolic function despite mild LA dilation, mildly increased LV wall thickness, normal RV, mild LAE/RAE. Her subsequent  device interrogation detected new onset atrial fibrillation so Dr. Carolynne Citron stopped her Plavix  and started her on Eliquis . He also switched her HCTZ to Lasix  for possible component of diastolic congestion.  She did undergo a CPX in November 2021 and this was normal for age with hypertensive BP response and suggestion of diastolic dysfunction with exercise. BP control recommended. Last Echo in October 2024 showed normal EF with grade 2 diastolic dysfunction. No valve disease.   She was recently seen by Dr Arlester Ladd for increased Afib burden noted on device.  Symptomatic. Being considered for AAD therapy with Tikosyn. Her pacer rate was increased and since then she states she can feel her toes  She is still having swelling in her legs and SOB. Has wheezing at night. Sleeps on 3 pillows. Not able to walk much with neuropathy.     Past Medical History:  Diagnosis Date   Allergy    Arthritis    CAD (coronary artery disease)    a. mild by prior cath; coronary CT 04/2019.   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/2016   TIA (transient ischemic attack) 11/15/2015   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    Past Surgical History:  Procedure Laterality Date   ABDOMINAL HYSTERECTOMY     APPENDECTOMY     bil carple tunnel surgery     CARDIAC CATHETERIZATION N/A 04/09/2015   Procedure: Left Heart Cath and Coronary Angiography;  Surgeon: Prakash Kimberling M [place], MD;  Location: [facility] INVASIVE CV LAB;  Service: Cardiovascular;  Laterality: N/A;   COLONOSCOPY  multiple   EP IMPLANTABLE DEVICE N/A 04/09/2016   Procedure: PPM  Musician;  Surgeon: Tammie Fall, MD;  Location: [facility] INVASIVE CV LAB;  Service: Cardiovascular;  Laterality: N/A;   ESOPHAGEAL MANOMETRY N/A 08/02/2017   Procedure: ESOPHAGEAL MANOMETRY (EM);  Surgeon: Sergio Dandy, MD;  Location: [facility] ENDOSCOPY;  Service: Endoscopy;  Laterality: N/A;   KNEE SURGERY     right x 2   NECK SURGERY     x2   PACEMAKER INSERTION     PH IMPEDANCE STUDY N/A 08/02/2017   Procedure:  PH IMPEDANCE STUDY;  Surgeon: Sergio Dandy, MD;  Location: [facility] ENDOSCOPY;  Service: Endoscopy;  Laterality: N/A;   UPPER GASTROINTESTINAL ENDOSCOPY      Current Medications: Current Meds  Medication Sig   apixaban  (ELIQUIS ) 5 MG TABS tablet TAKE 1 TABLET(5 MG) BY MOUTH TWICE DAILY   atorvastatin  (LIPITOR) 80 MG tablet Take 1 tablet (80 mg total) by mouth every evening.   dapagliflozin propanediol (FARXIGA) 10 MG TABS tablet Take 1 tablet (10 mg total) by mouth daily before breakfast.   Evolocumab  (REPATHA  SURECLICK) 140 MG/ML SOAJ Inject 140 mg into the skin every 14 (fourteen) days.   furosemide  (LASIX ) 20 MG tablet If blood pressure is below 110 do not take lasix . If blood pressure is 110-130 take 1 tablet (20 mg) of lasix . If blood pressure is above 130 take 2 tablets (40 mg) of lasix .   glipiZIDE  (GLUCOTROL ) 5 MG tablet Take by mouth 2 (two) times daily before a meal.   glucose blood (ONETOUCH ULTRA) test strip 2 (two) times daily.   lidocaine -prilocaine  (EMLA ) cream APPLY 1 APPLICATION TOPICALLY AS NEEDED   losartan  (COZAAR ) 50 MG tablet Take 1 tablet (50 mg total) by mouth daily.   pantoprazole  (PROTONIX ) 40 MG tablet Take 1 tablet (40 mg total) by mouth 2 (two) times daily before a meal.   potassium chloride  SA (KLOR-CON  M) 20 MEQ tablet TAKE 1 TABLET(20 MEQ) BY MOUTH DAILY   [DISCONTINUED] amLODipine  (NORVASC ) 2.5 MG tablet TAKE 1 TABLET(2.5 MG) BY MOUTH DAILY      Allergies:   Citalopram, Codeine, Cymbalta [duloxetine hcl], Metformin and related, Paroxetine hcl, Trazodone and nefazodone, and Caffeine   Social History   Socioeconomic History   Marital status: Married    Spouse name: james   Number of children: 2   Years of education: 12   Highest education level: Not on file  Occupational History   Occupation: Retired    Associate Professor: RETIRED  Tobacco Use   Smoking status: Never   Smokeless tobacco: Never  Vaping Use   Vaping status: Never Used  Substance and  Sexual Activity   Alcohol use: No    Alcohol/week: 0.0 standard drinks of alcohol   Drug use: No   Sexual activity: Not Currently  Other Topics Concern   Not on file  Social History Narrative   Patient is married with 2 children. She lives at home with her husband.    Patient is right handed.   Patient has 12 th grade education.   Patient drinks occasional caffeine.   Social Drivers of Health   Financial Resource Strain: Medium Risk (07/10/2022)   Received [facility] prior to [facility]02/2023[facility]   Overall Financial Resource Strain (CARDIA)    Difficulty of Paying Living Expenses: Somewhat hard  Food Insecurity: Low Risk  (08/04/2023)   Received from [facility]   Hunger Vital Sign    Within the past 12 months, you worried that your food would run out  before you got money to buy more: Never true    Within the past 12 months, the food you bought just didn't last and you didn't have money to get more. : Never true  Transportation Needs: No Transportation Needs (08/04/2023)   Received from [employer]    In the past 12 months, has lack of reliable transportation kept you from medical appointments, meetings, work or from getting things needed for daily living? : No  Physical Activity: Insufficiently Active (07/10/2022)   Received from [facility], [facility] visits prior to 01/02/2023.   Exercise Vital Sign    On average, how many days per week do you engage in moderate to strenuous exercise (like a brisk walk)?: 4 days    On average, how many minutes do you engage in exercise at this level?: 30 min  Stress: No Stress Concern Present (07/10/2022)   Received from [facility], [facility] visits prior to 01/02/2023.   [employer] of Occupational Health - Occupational Stress Questionnaire    Feeling of Stress : Only a little  Social Connections: Socially Integrated (07/10/2022)    Received from [facility], [facility] visits prior to 01/02/2023.   Social Connection and Isolation Panel    In a typical week, how many times do you talk on the phone with family, friends, or neighbors?: Once a week    How often do you get together with friends or relatives?: Twice a week    How often do you attend church or religious services?: More than 4 times per year    Do you belong to any clubs or organizations such as church groups, unions, fraternal or athletic groups, or school groups?: Yes    How often do you attend meetings of the clubs or organizations you belong to?: More than 4 times per year    Are you married, widowed, divorced, separated, never married, or living with a partner?: Married     Family History:  The patient's family history includes Breast cancer in her maternal aunt; Colon cancer in her brother, father, and sister; Colon polyps in her sister; Coronary artery disease in her father; Crohn's disease in her sister; Dementia in her daughter and mother; Diabetes in her cousin, daughter, father, maternal aunt, and maternal uncle; Heart attack in her brother, daughter, and father; Irritable bowel syndrome in her sister; Kidney disease in her daughter; Lung cancer in her father and mother; Prostate cancer in her father. There is no history of Hypertension, Esophageal cancer, Rectal cancer, or Stomach cancer.  ROS:   Please see the history of present illness. Otherwise, review of systems is positive for nocturnal wheezing sometimes All other systems are reviewed and otherwise negative.    EKGs/Labs/Other Studies Reviewed:    Studies reviewed are outlined and summarized above. Reports included below if pertinent.  2D Echo 05/2019   1. The left ventricle has normal systolic function with an ejection  fraction of 60-65%. The cavity size was normal. There is mildly increased  left ventricular wall thickness. Mitral medial e' is 8 cm/s, suggesting   normal diastolic function despite mild  LA dilation.   2. The right ventricle has normal systolic function. The cavity was  normal. There is no increase in right ventricular wall thickness. Right  ventricular systolic pressure is normal with an estimated pressure of 28.4  mmHg.   3. Left atrial size was mildly dilated.   4. Right  atrial size was mildly dilated  Cor CT 04/2019 FINDINGS: A 120 kV prospective scan was triggered in the descending thoracic aorta at 111 HU's. Axial non-contrast 3 mm slices were carried out through the heart. The data set was analyzed on a dedicated work station and scored using the Agatson method. Gantry rotation speed was 250 msecs and collimation was .6 mm. No beta blockade and 0.8 mg of sl NTG was given. The 3D data set was reconstructed in 5% intervals of the 67-82 % of the R-R cycle. Diastolic phases were analyzed on a dedicated work station using MPR, MIP and VRT modes. The patient received 80 cc of contrast.   Aorta: Normal size. Minimal diffuse atherosclerotic plaque and calcifications. No dissection.   Aortic Valve:  Trileaflet.  No calcifications.   Coronary Arteries:  Normal coronary origin.  Right dominance.   RCA is a large dominant artery that gives rise to PDA and PLVB. There is minimal diffuse calcified plaque with stenosis 0-25%.   Left main is a short artery that gives rise to LAD and LCX arteries. Left main has no plaque.   LAD is a large vessel that gives rise to two small diagonal arteries. There is mild calcified plaque in the proximal and mid LAD with stenosis 25-49%.   LCX is a non-dominant artery that gives rise to one large OM1 branch. There is no plaque.   Other findings:   Normal pulmonary vein drainage into the left atrium.   A large left atrial appendage without an evidence for a thrombus.   Normal size of the pulmonary artery.   IMPRESSION: 1. Coronary calcium  score of 34. This was 80 percentile for age  and sex matched control.   2. Normal coronary origin with right dominance.   3. Mild non-obstructive CAD, aggressive risk factor modification is recommended.   4. Pacemaker leads are seen in the right atrium and right ventricle.     Electronically Signed   By: Christoper Crafts   On: 04/10/2019 12:45  IMPRESSION: Small hiatal hernia.   No acute extra cardiac abnormality.   Electronically Signed: By: Janeece Mechanic M.D. On: 04/10/2019 12:33     CPX 09/12/20: Cardiopulmonary exercise test Order: [id number] Status: Final result   Visible to patient: Yes (seen)   Next appt: 02/06/2021 at 11:00 AM in Cardiology (Salmaan Patchin [place], MD)   Dx: Chronic shortness of breath   3 Result Notes      Narrative  Referred for: dyspnea   Procedure: This patient underwent staged symptom-limited exercise treadmill testing using a modified Naughton protocol with expired gas analysis metabolic evaluation during exercise.   Demographics   Age: 33 Ht. (in.) 66 Wt. (lb) 161.2 BMI: 26.0      Predicted Peak VO2: 17.1   Gender: Female Ht (cm) 167.6 Wt. (kg) 73.1    Results   Pre-Exercise PFTs   FVC 2.18 (77%)       FEV1 1.70 (79%)         FEV1/FVC 78 (100%)         MVV 73 (85%)        Exercise Time:    9:30   Speed (mph): 2.0       Grade (%): 10.5       RPE: 18   Reason stopped: dyspnea (8/10)   Additional symptoms: lightheaded (7/10)   Resting HR: 63 Standing HR: 63 Peak HR: 137   (96% age predicted max HR)   BP rest: 148/74 Standing BP:  130/68 BP peak: 204/58   Peak VO2: 16.4 (96% predicted peak VO2)   VE/VCO2 slope:  34   OUES: 1.35   Peak RER: 1.11   Ventilatory Threshold: 13.1 (77% predicted or measured peak VO2)   Peak RR 34   Peak Ventilation:  49.7   VE/MVV:  68%   PETCO2 at peak:  32   O2pulse:  8   (89% predicted O2pulse)    Interpretation   Notes: Patient gave a very good effort. Pulse-oximetry remained 96-99% for the duration of exercise.    ECG:  Resting ECG in normal sinus rhythm with atrial pacing. HR response appropriate. There were no sustained arrhythmias or ST-T changes. BP response hypertensive at peak.   PFT:  Pre-exercise spirometry was within mostly normal limits. The MVV was normal.    CPX:  Exercise testing with gas exchange demonstrates a normal peak VO2 of 16.4 ml/kg/min (96% of the age/gender/weight matched sedentary norms). The RER of 1.11 indicates a maximal effort. When adjusted to the patient's ideal body weight of 145.9 lb (66.2 kg) the peak VO2 is 18.1 ml/kg (ibw)/min (102% of the ibw-adjusted predicted). The VE/VCO2 slope is mildly elevated and indicates mildly increased dead space ventilation. The oxygen uptake efficiency slope (OUES) is normal. The VO2 at the ventilatory threshold was normal at 77% of the predicted peak VO2. At peak exercise, the ventilation reached 68% of the measured MVV indicating ventilatory reserve remained. The O2pulse (a surrogate for stroke volume) increased initially, but remained flat for the majority of incremental intensity exercise, at a peak of 65ml/beat (89% predicted).    Conclusion: Exercise testing with gas exchange demonstrates normal functional capacity when compared to matched sedentary norms. There is no clear cardiopulmonary limitation. However, there was a hypertensive response with early flattening of O2 pulse.    Test, report and preliminary impression by:  Todd Fossa, MS, ACSM-RCEP  09/12/2020 4:14 PM   Attending: Overall normal function capacity for age and gender. Resting spirometry reveals mild lung restriction and may be related to her body habitus. There is a normal HR response to exercise. There is a hypertensive response to exercise with mildly increased VE/VCO2 slope which likely suggests diastolic dysfunction and elevated filling pressures during exercise. Would recommend improved BP control, weight loss and routine exercise training.   Jules Oar, MD  3:26 PM        Echo 08/25/23: IMPRESSIONS     1. Left ventricular ejection fraction, by estimation, is 60 to 65%. Left  ventricular ejection fraction by 3D volume is 60 %. The left ventricle has  normal function. The left ventricle has no regional wall motion  abnormalities. Left ventricular diastolic   parameters are consistent with Grade II diastolic dysfunction  (pseudonormalization). Elevated left ventricular end-diastolic pressure.  The E/e' is 15.   2. Right ventricular systolic function is normal. The right ventricular  size is normal. There is normal pulmonary artery systolic pressure. The  estimated right ventricular systolic pressure is 25.1 mmHg.   3. Left atrial size was mildly dilated.   4. The mitral valve is grossly normal. Mild mitral valve regurgitation.   5. The aortic valve is tricuspid. Aortic valve regurgitation is not  visualized. Aortic valve sclerosis is present, with no evidence of aortic  valve stenosis.   6. The inferior vena cava is normal in size with greater than 50%  respiratory variability, suggesting right atrial pressure of 3 mmHg.   Comparison(s): Changes from prior study are noted. 05/08/2019:  LVEF 60-65%,  normal diastolic function.   EKG:  EKG is not ordered today  Recent Labs: 08/02/2023: BNP 153.6; Hemoglobin 13.3; Platelets 212 11/09/2023: ALT 25 04/12/2024: BUN 14; Creatinine, Ser 0.90; Potassium 4.2; Sodium 143; TSH 3.660  Recent Lipid Panel    Component Value Date/Time   CHOL 76 (L) 11/09/2023 1111   TRIG 65 11/09/2023 1111   HDL 55 11/09/2023 1111   CHOLHDL 1.4 11/09/2023 1111   CHOLHDL 3.2 05/11/2016 0638   VLDL 27 05/11/2016 0638   LDLCALC 6 11/09/2023 1111   Dated 01/15/21: A1c 6.5%. glucose 121.  CMET and CBC normal. B12 normal, TSH normal.    PHYSICAL EXAM:    VS:  BP 128/62 (BP Location: Left Arm, Patient Position: Sitting, Cuff Size: Normal)   Pulse 73   Ht 5' 6 (1.676 m)   Wt 166 lb (75.3 kg)   SpO2  96%   BMI 26.79 kg/m   BMI: Body mass index is 26.79 kg/m.  GEN: Well nourished, well developed WF, in no acute distress HEENT: normocephalic, atraumatic Neck: no JVD, carotid bruits, or masses Cardiac: RRR; no murmurs, rubs, or gallops, 1-2+ edema Respiratory:  clear to auscultation bilaterally, normal work of breathing GI: soft, nontender, nondistended, + BS MS: no deformity or atrophy Skin: warm and dry, no rash Neuro:  Alert and Oriented x 3, Strength and sensation are intact, follows commands Psych: euthymic mood, full affect  Wt Readings from Last 3 Encounters:  04/21/24 166 lb (75.3 kg)  04/12/24 165 lb 6.4 oz (75 kg)  11/01/23 163 lb (73.9 kg)     ASSESSMENT & PLAN:   Paroxysmal atrial fibrillation -  Afib burden has increased recently. She is on  Eliquis .  CHADS2VA2SC score is 8. Pacer adjusted by EP. Being considered for AAD therapy with Tikosyn if Afib burden remains high.   Acute on chronic diastolic dysfunction. Exacerbated by Afib. Recommend discontinuing amlodipine  since this may contribute to her swelling. Will start Farxiga 10 mg daily. Continue current diuretic dose for now. Sodium restriction.   Mild CAD - no anginal symptoms.  Not on BB due to h/o sinus node dysfunction. Not on ASA due to concomitant Eliquis .  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  DM on glipizide . Adding Farxiga for CHF indication.   Disposition: F/u with me in 4 months  Medication Adjustments/Labs and Tests Ordered: Current medicines are reviewed at length with the patient today.  Concerns regarding medicines are outlined above. Medication changes, Labs and Tests ordered today are summarized above and listed in the Patient Instructions accessible in Encounters.   Signed, Ruddy Swire [place], MD  04/21/2024 9:09 AM

## 2024-04-21 ENCOUNTER — Ambulatory Visit: Payer: HMO | Attending: Cardiology | Admitting: Cardiology

## 2024-04-21 VITALS — BP 128/62 | HR 73 | Ht 66.0 in | Wt 166.0 lb

## 2024-04-21 DIAGNOSIS — Z950 Presence of cardiac pacemaker: Secondary | ICD-10-CM | POA: Diagnosis not present

## 2024-04-21 DIAGNOSIS — I2510 Atherosclerotic heart disease of native coronary artery without angina pectoris: Secondary | ICD-10-CM | POA: Diagnosis not present

## 2024-04-21 DIAGNOSIS — I480 Paroxysmal atrial fibrillation: Secondary | ICD-10-CM | POA: Diagnosis not present

## 2024-04-21 DIAGNOSIS — I495 Sick sinus syndrome: Secondary | ICD-10-CM

## 2024-04-21 DIAGNOSIS — I5033 Acute on chronic diastolic (congestive) heart failure: Secondary | ICD-10-CM

## 2024-04-21 MED ORDER — DAPAGLIFLOZIN PROPANEDIOL 10 MG PO TABS: 10.0000 mg | ORAL_TABLET | Freq: Every day | ORAL | 3 refills | Status: AC

## 2024-04-21 NOTE — Patient Instructions (Signed)
 Medication Instructions:  Stop Amlodipine   Start Farxiga 10 mg take one every morning before breakfast Continue all other medications *If you need a refill on your cardiac medications before your next appointment, please call your pharmacy*  Lab Work: None ordered   Testing/Procedures: None ordered  Follow-Up: At [facility], you and your health needs are our priority.  As part of our continuing mission to provide you with exceptional heart care, our providers are all part of one team.  This team includes your primary Cardiologist (physician) and Advanced Practice Providers or APPs (Physician Assistants and Nurse Practitioners) who all work together to provide you with the care you need, when you need it.  Your next appointment:  4 months    Provider:  Dr.Jordan   We recommend signing up for the patient portal called MyChart.  Sign up information is provided on this After Visit Summary.  MyChart is used to connect with patients for Virtual Visits (Telemedicine).  Patients are able to view lab/test results, encounter notes, upcoming appointments, etc.  Non-urgent messages can be sent to your provider as well.   To learn more about what you can do with MyChart, go to [email or web address].

## 2024-04-24 ENCOUNTER — Ambulatory Visit: Admitting: Physician Assistant

## 2024-04-28 ENCOUNTER — Other Ambulatory Visit: Payer: Self-pay | Admitting: Family Medicine

## 2024-04-28 DIAGNOSIS — Z1231 Encounter for screening mammogram for malignant neoplasm of breast: Secondary | ICD-10-CM

## 2024-05-02 NOTE — Progress Notes (Unsigned)
  Electrophysiology Office Note:   Date:  05/03/2024  ID:  Ritaj, Dullea [birth year], MRN [id number]  Primary Cardiologist: Peter [place], MD Primary Heart Failure: None Electrophysiologist: Danelle Birmingham, MD       History of Present Illness:   [name] is a 83 y.o. female with h/o SND/SSS s/p PPM, AF, non-obs CAD seen today for routine electrophysiology followup.   Recently seen on 04/12/24 for high atrial rates on device and new CHF symptoms.   Since last being seen in our clinic the patient reports she has noted increased swelling in the last month or so, fatigue / tired. She has increased her lasix  use and has had improvement in swelling.     She denies chest pain, palpitations, dyspnea, PND, orthopnea, nausea, vomiting, dizziness, syncope, edema, weight gain, or early satiety.   Review of systems complete and found to be negative unless listed in HPI.    EP Information / Studies Reviewed:    EKG is not ordered today. EKG from 04/12/24 reviewed which showed AF with PVC's, 73 bpm      PPM Interrogation-  reviewed in detail today,  See PACEART report.  Device History: Medtronic Dual Chamber PPM implanted 07/12/2006 for Sinus Node Dysfunction / SSS Generator Change > 04/09/16  Studies:  ECHO 08/2023 > LVEF 60-65%, G2DD, mildly dilated LA   Arrhythmia / AAD AF    Risk Assessment/Calculations:    CHA2DS2-VASc Score = 8   This indicates a 10.8% annual risk of stroke. The patient's score is based upon: CHF History: 0 HTN History: 1 Diabetes History: 1 Stroke History: 2 Vascular Disease History: 1 Age Score: 2 Gender Score: 1             Physical Exam:   VS:  BP 132/68   Pulse 76   Ht 5' 6 (1.676 m)   Wt 163 lb (73.9 kg)   SpO2 97%   BMI 26.31 kg/m    Wt Readings from Last 3 Encounters:  05/03/24 163 lb (73.9 kg)  04/21/24 166 lb (75.3 kg)  04/12/24 165 lb 6.4 oz (75 kg)     GEN: Well nourished, well developed in no acute  distress NECK: No JVD; No carotid bruits CARDIAC: Irregularly irregular rate and rhythm, no murmurs, rubs, gallops RESPIRATORY:  Clear to auscultation without rales, wheezing or rhonchi  ABDOMEN: Soft, non-tender, non-distended EXTREMITIES:  No edema; No deformity   ASSESSMENT AND PLAN:    SND s/p Medtronic PPM  -Normal PPM function -See Pace Art report -No changes today  Paroxysmal Atrial Fibrillation  CHA2DS2-VASc 8 -71% AF burden on device  -V rates largely controlled in 70's-80's but brief episodes into 160's  -DCCV not felt to provide durable control without AAD -LRL increased at last visit > pt feels better -BMP with CrCL of 57 mL/min > consider Tikosyn  -arrange f/u with AF Clinic for Tikosyn to discuss tikosyn, review candidacy for possible admission.  -CrCl 57 ml/min based on current labs  Secondary Hypercoagulable State  -continue Eliquis  5mg  BID, dose reviewed and appropriate by wt / Cr  HFpEF  -volume status improved with diuresis  -64 oz fluid restriction   Hypertension  -well controlled on current regimen    Disposition:   Follow up with Afib Clinic 06/13/24 to discuss Tikosyn admission / candidacy   Signed, Daphne Barrack, NP-C, AGACNP-BC [facility] - Electrophysiology  05/03/2024, 1:20 PM

## 2024-05-03 ENCOUNTER — Encounter: Payer: Self-pay | Admitting: Pulmonary Disease

## 2024-05-03 ENCOUNTER — Ambulatory Visit: Attending: Pulmonary Disease | Admitting: Pulmonary Disease

## 2024-05-03 VITALS — BP 132/68 | HR 76 | Ht 66.0 in | Wt 163.0 lb

## 2024-05-03 DIAGNOSIS — I5033 Acute on chronic diastolic (congestive) heart failure: Secondary | ICD-10-CM

## 2024-05-03 DIAGNOSIS — I495 Sick sinus syndrome: Secondary | ICD-10-CM | POA: Diagnosis not present

## 2024-05-03 DIAGNOSIS — I480 Paroxysmal atrial fibrillation: Secondary | ICD-10-CM

## 2024-05-03 DIAGNOSIS — Z950 Presence of cardiac pacemaker: Secondary | ICD-10-CM

## 2024-05-03 LAB — CUP PACEART INCLINIC DEVICE CHECK
Battery Remaining Longevity: 16 mo
Battery Voltage: 2.92 V
Brady Statistic AP VP Percent: 0.2 %
Brady Statistic AP VS Percent: 28.41 %
Brady Statistic AS VP Percent: 58.49 %
Brady Statistic AS VS Percent: 12.9 %
Brady Statistic RA Percent Paced: 26.91 %
Brady Statistic RV Percent Paced: 57.89 %
Date Time Interrogation Session: 20250702131632
Implantable Lead Connection Status: 753985
Implantable Lead Connection Status: 753985
Implantable Lead Implant Date: 20070910
Implantable Lead Implant Date: 20070910
Implantable Lead Location: 753859
Implantable Lead Location: 753860
Implantable Lead Model: 5076
Implantable Lead Model: 5076
Implantable Pulse Generator Implant Date: 20170608
Lead Channel Impedance Value: 323 Ohm
Lead Channel Impedance Value: 418 Ohm
Lead Channel Impedance Value: 418 Ohm
Lead Channel Impedance Value: 456 Ohm
Lead Channel Pacing Threshold Amplitude: 0.875 V
Lead Channel Pacing Threshold Amplitude: 1 V
Lead Channel Pacing Threshold Pulse Width: 0.4 ms
Lead Channel Pacing Threshold Pulse Width: 0.4 ms
Lead Channel Sensing Intrinsic Amplitude: 1.5 mV
Lead Channel Sensing Intrinsic Amplitude: 1.5 mV
Lead Channel Sensing Intrinsic Amplitude: 10.25 mV
Lead Channel Sensing Intrinsic Amplitude: 15 mV
Lead Channel Setting Pacing Amplitude: 2 V
Lead Channel Setting Pacing Amplitude: 2.5 V
Lead Channel Setting Pacing Pulse Width: 0.4 ms
Lead Channel Setting Sensing Sensitivity: 2.8 mV
Zone Setting Status: 755011
Zone Setting Status: 755011

## 2024-05-03 MED ORDER — POTASSIUM CHLORIDE CRYS ER 20 MEQ PO TBCR: EXTENDED_RELEASE_TABLET | ORAL | 3 refills | Status: AC

## 2024-05-03 MED ORDER — FUROSEMIDE 20 MG PO TABS: ORAL_TABLET | ORAL | 3 refills | Status: DC

## 2024-05-03 NOTE — Patient Instructions (Signed)
 Medication Instructions:  Your physician recommends that you continue on your current medications as directed. Please refer to the Current Medication list given to you today.  *If you need a refill on your cardiac medications before your next appointment, please call your pharmacy*  Lab Work: None ordered If you have labs (blood work) drawn today and your tests are completely normal, you will receive your results only by: MyChart Message (if you have MyChart) OR A paper copy in the mail If you have any lab test that is abnormal or we need to change your treatment, we will call you to review the results.  Follow-Up: At [facility], you and your health needs are our priority.  As part of our continuing mission to provide you with exceptional heart care, our providers are all part of one team.  This team includes your primary Cardiologist (physician) and Advanced Practice Providers or APPs (Physician Assistants and Nurse Practitioners) who all work together to provide you with the care you need, when you need it.  Your next appointment:   1 month(s)  Provider:   You will follow up in the Atrial Fibrillation Clinic located at [facility]. Your provider will be: Clint R. Fenton, PA-C or Fairy Heinrich, PA-C    Other Instructions 1.Be sure to wear your compression socks as discussed 2.Maintain fluid restriction of less than 64 oz of water daily  Dofetilide Capsules What is this medication? DOFETILIDE (doe FET il ide) treats a fast or irregular heartbeat (arrhythmia). It works by slowing down overactive electric signals in the heart, which stabilizes your heart rhythm. It belongs to a group of medications called antiarrhythmics. This medicine may be used for other purposes; ask your health care provider or pharmacist if you have questions. COMMON BRAND NAME(S): Tikosyn What should I tell my care team before I take this medication? They need to know if you have any of these  conditions: Heart disease History of irregular heartbeat History of low levels of potassium or magnesium in the blood Kidney disease Liver disease An unusual or allergic reaction to dofetilide, other medications, foods, dyes, or preservatives Pregnant or trying to get pregnant Breast-feeding How should I use this medication? Take this medication by mouth with a glass of water. Follow the directions on the prescription label. Do not take with grapefruit juice. You can take it with or without food. If it upsets your stomach, take it with food. Take your medication at regular intervals. Do not take it more often than directed. Do not stop taking except on your care team's advice. A special MedGuide will be given to you by the pharmacist with each prescription and refill. Be sure to read this information carefully each time. Talk to your care team about the use of this medication in children. Special care may be needed. Overdosage: If you think you have taken too much of this medicine contact a poison control center or emergency room at once. NOTE: This medicine is only for you. Do not share this medicine with others. What if I miss a dose? If you miss a dose, skip it. Take your next dose at the normal time. Do not take extra or 2 doses at the same time to make up for the missed dose. What may interact with this medication? Do not take this medication with any of the following: Certain antivirals for HIV or hepatitis, such as bictegravir, delavirdine, dolutegravir, saquinavir Certain diuretics, such as chlorothiazide, chlorthalidone, hydrochlorothiazide , metolazone Certain medications for  irregular heartbeat, such as dronedarone, lidocaine , mexiletine, procainamide, quinidine Certain medications for fungal infections, such as fexinidazole, fluconazole, isavuconazonium, itraconazole, ketoconazole, posaconazole Cimetidine Cisapride Fedratinib Fingolimod Megestrol Other medications that cause  heart rhythm changes Pimozide Prochlorperazine Quinine Tafenoquine Thioridazine Trilaciclib Trimethoprim Verapamil  Ziprasidone This medication may also interact with the following: Amiloride Cannabinoids Certain antibiotics, such as erythromycin, clarithromycin, ciprofloxacin, levofloxacin Certain medications for depression, anxiety, or other mental health conditions Diltiazem Grapefruit juice Metformin Triamterene Zafirlukast Other medications may affect the way this medication works. Talk with your care team about all the medications you take. They may suggest changes to your treatment plan to lower the risk of side effects and to make sure your medications work as intended. This list may not describe all possible interactions. Give your health care provider a list of all the medicines, herbs, non-prescription drugs, or dietary supplements you use. Also tell them if you smoke, drink alcohol, or use illegal drugs. Some items may interact with your medicine. What should I watch for while using this medication? Your condition will be monitored carefully while you are receiving this medication. What side effects may I notice from receiving this medication? Side effects that you should report to your care team as soon as possible: Allergic reactions--skin rash, itching, hives, swelling of the face, lips, tongue, or throat Chest pain Heart rhythm changes--fast or irregular heartbeat, dizziness, feeling faint or lightheaded, chest pain, trouble breathing Side effects that usually do not require medical attention (report to your care team if they continue or are bothersome): Dizziness Headache Nausea Stomach pain Trouble sleeping This list may not describe all possible side effects. Call your doctor for medical advice about side effects. You may report side effects to FDA at 1-800-FDA-1088. Where should I keep my medication? Keep out of the reach of children. Store at room temperature  between 15 and 30 degrees C (59 and 86 degrees F). Throw away any unused medication after the expiration date. NOTE: This sheet is a summary. It may not cover all possible information. If you have questions about this medicine, talk to your doctor, pharmacist, or health care provider.  2024 Elsevier/Gold Standard (2023-06-02 00:00:00)

## 2024-05-04 ENCOUNTER — Ambulatory Visit: Admitting: Physician Assistant

## 2024-05-04 ENCOUNTER — Encounter: Payer: Self-pay | Admitting: Physician Assistant

## 2024-05-04 VITALS — BP 147/63

## 2024-05-04 DIAGNOSIS — D492 Neoplasm of unspecified behavior of bone, soft tissue, and skin: Secondary | ICD-10-CM

## 2024-05-04 DIAGNOSIS — C4361 Malignant melanoma of right upper limb, including shoulder: Secondary | ICD-10-CM | POA: Diagnosis not present

## 2024-05-04 DIAGNOSIS — C439 Malignant melanoma of skin, unspecified: Secondary | ICD-10-CM

## 2024-05-04 DIAGNOSIS — D485 Neoplasm of uncertain behavior of skin: Secondary | ICD-10-CM

## 2024-05-04 HISTORY — DX: Malignant melanoma of skin, unspecified: C43.9

## 2024-05-04 NOTE — Patient Instructions (Signed)

## 2024-05-04 NOTE — Progress Notes (Signed)
   New Patient Visit   Subjective  [name] is a 83 y.o. female who presents for the following: Spot of right upper arm x a couple years that is getting bigger.  Patient declines TBSE today.  The following portions of the chart were reviewed this encounter and updated as appropriate: medications, allergies, medical history  Review of Systems:  No other skin or systemic complaints except as noted in HPI or Assessment and Plan.  Objective  Well appearing patient in no apparent distress; mood and affect are within normal limits.   A focused examination was performed of the following areas: Right arm   Relevant exam findings are noted in the Assessment and Plan.  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 Epidermal / dermal shaving  Lesion diameter (cm):  2.2 Informed consent: discussed and consent obtained   Timeout: patient name, date of birth, surgical site, and procedure verified   Procedure prep:  Patient was prepped and draped in usual sterile fashion Prep type:  Isopropyl alcohol Anesthesia: the lesion was anesthetized in a standard fashion   Anesthetic:  1% lidocaine  w/ epinephrine 1-100,000 buffered w/ 8.4% NaHCO3 Instrument used: flexible razor blade   Hemostasis achieved with: pressure, aluminum chloride and electrodesiccation   Outcome: patient tolerated procedure well   Post-procedure details: sterile dressing applied and wound care instructions given   Dressing type: bandage and petrolatum    Specimen 1 - Surgical pathology Differential Diagnosis: R/O MM  Check Margins: No  Return for pending biopsy results.  I, Roseline Hutchinson, CMA, am acting as scribe for Danh Bayus K, PA-C .   Documentation: I have reviewed the above documentation for accuracy and completeness, and I agree with the above.  Oisin Yoakum K,  PA-C

## 2024-05-09 ENCOUNTER — Other Ambulatory Visit: Payer: Self-pay | Admitting: Cardiology

## 2024-05-09 ENCOUNTER — Ambulatory Visit: Payer: Self-pay | Admitting: Physician Assistant

## 2024-05-09 LAB — SURGICAL PATHOLOGY

## 2024-05-10 ENCOUNTER — Encounter: Payer: Self-pay | Admitting: Physician Assistant

## 2024-05-10 NOTE — Telephone Encounter (Signed)
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  Schedule for Mohs - Tier 4 tumor - 8:15 with Dr. Corey.  Will also need 3 month f/u skin exam scheduled with me (bks).  Patient notified.  ----- Message ----- From: Interface, [facility] Sent: 05/09/2024   4:16 PM EDT To: Erminio MARLA Like, PA-C

## 2024-05-10 NOTE — Telephone Encounter (Signed)
 Patient was advised of results and a message was sent to schedulers for a Mohs appointment with Dr Paci/hd

## 2024-05-23 ENCOUNTER — Encounter: Payer: Self-pay | Admitting: Dermatology

## 2024-05-24 ENCOUNTER — Ambulatory Visit

## 2024-05-24 NOTE — Progress Notes (Signed)
 Remote pacemaker transmission.

## 2024-05-24 NOTE — Addendum Note (Signed)
 Addended by: VICCI SELLER A on: 05/24/2024 11:34 AM   Modules accepted: Orders

## 2024-05-25 ENCOUNTER — Ambulatory Visit
Admission: RE | Admit: 2024-05-25 | Discharge: 2024-05-25 | Disposition: A | Discharge status: Home / Self Care | Source: Ambulatory Visit | Attending: Family Medicine | Admitting: Family Medicine

## 2024-05-25 DIAGNOSIS — Z1231 Encounter for screening mammogram for malignant neoplasm of breast: Secondary | ICD-10-CM

## 2024-05-29 ENCOUNTER — Encounter: Payer: Self-pay | Admitting: Dermatology

## 2024-05-29 ENCOUNTER — Ambulatory Visit: Admitting: Dermatology

## 2024-05-29 VITALS — BP 129/63 | HR 79 | Temp 97.9°F

## 2024-05-29 DIAGNOSIS — L579 Skin changes due to chronic exposure to nonionizing radiation, unspecified: Secondary | ICD-10-CM

## 2024-05-29 DIAGNOSIS — L814 Other melanin hyperpigmentation: Secondary | ICD-10-CM | POA: Diagnosis not present

## 2024-05-29 DIAGNOSIS — C4361 Malignant melanoma of right upper limb, including shoulder: Secondary | ICD-10-CM

## 2024-05-29 DIAGNOSIS — C439 Malignant melanoma of skin, unspecified: Secondary | ICD-10-CM

## 2024-05-29 NOTE — Progress Notes (Signed)
 Follow-Up Visit   Subjective  [name] is a 83 y.o. 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 Lesion has been growing for time.   The following portions of the chart were reviewed this encounter and updated as appropriate: medications, allergies, medical history  Review of Systems:  No other skin or systemic complaints except as noted in HPI or Assessment and Plan.  Objective  Well appearing patient in no apparent distress; mood and affect are within normal limits.  A focused examination was performed of the following areas: Right upper arm Relevant physical exam findings are noted in the Assessment and Plan.   Right Upper Arm Healing biopsy site   Assessment & Plan   MALIGNANT MELANOMA OF SKIN (HCC) Right Upper Arm Mohs surgery  Consent obtained: written  Anticoagulation: Was the anticoagulation regimen changed prior to Mohs? No    Anesthesia: Anesthesia method: local infiltration Local anesthetic: lidocaine  1% WITH epi  Procedure Details: Timeout: pre-procedure verification complete Procedure Prep: patient was prepped and draped in usual sterile fashion Prep type: chlorhexidine  Pre-Op diagnosis: melanoma Melanoma subtype: invasive Melanoma Breslow depth (mm): 1 MohsAIQ Surgical site (if tumor spans multiple areas, please select predominant area): upper extremity Surgery side: right Surgical site (from skin exam): Right Upper Arm Pre-operative length (cm): 2.5 Pre-operative width (cm): 1.7 Indications for Mohs surgery: anatomic location where tissue conservation is critical, tumor size greater than 2 cm and aggressive histology  Micrographic Surgery Details: Post-operative length (cm): 4.1 Post-operative width (cm): 3.5 Number of Mohs stages: 2 Cumulative additional sections past 5 per stage: 0 Post surgery depth of defect: subcutaneous fat  Stage  2    Tumor features identified on Mohs section: no tumor identified  Reconstruction: Was the defect reconstructed? Yes   Was reconstruction performed by the same Mohs surgeon? Yes   Setting of reconstruction: outpatient office When was reconstruction performed? same day Type of reconstruction: linear Linear reconstruction: complex  Skin repair Complexity:  Complex Final length (cm):  9.9 Informed consent: discussed and consent obtained   Timeout: patient name, date of birth, surgical site, and procedure verified   Procedure prep:  Patient was prepped and draped in usual sterile fashion Prep type:  Chlorhexidine  Anesthesia: the lesion was anesthetized in a standard fashion   Anesthetic:  1% lidocaine  w/ epinephrine 1-100,000 buffered w/ 8.4% NaHCO3 Reason for type of repair: reduce tension to allow closure, preserve normal anatomy, preserve normal anatomical and functional relationships and avoid adjacent structures   Undermining: area extensively undermined   Subcutaneous layers (deep stitches):  Suture size:  3-0 (with dermabond and steri strips) Suture type: PDS (polydioxanone)   Stitches:  Buried vertical mattress Fine/surface layer approximation (top stitches):  Suture type: cyanoacrylate tissue glue   Hemostasis achieved with: suture, pressure and electrodesiccation Outcome: patient tolerated procedure well with no complications   Post-procedure details: sterile dressing applied and wound care instructions given   Dressing type: bandage and pressure dressing      No follow-ups on file.  LILLETTE Darice Smock, CMA, am acting as scribe for RUFUS CHRISTELLA HOLY, MD.    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 They note that the area has been present for about 2-3 years increasing in size with time.  There is no  history of previous  treatment.  Reports no other new or changing lesions and has no other complaints today.  Medications and allergies: see patient chart.  Review of systems: Reviewed 8 systems and notable for the above skin cancer.  All other systems reviewed are unremarkable/negative, unless noted in the HPI. Past medical history, surgical history, family history, social history were also reviewed and are noted in the chart/questionnaire.    PHYSICAL EXAMINATION  General: Well-appearing, in no acute distress, alert and oriented x 4. Vitals reviewed in chart (if available).   Skin: Exam reveals a 2.5 x 1.7 cm erythematous papule and biopsy scar on the right upper arm. There are rhytids, telangiectasias, and lentigines, consistent with photodamage. Lymph nodes: No cervical, axillary or inguinal lymphadenopathy.  Biopsy report(s) reviewed, confirming the diagnosis.   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 Due to location, size, histology, or recurrence and the likelihood of subclinical extension as well as the need to conserve normal surrounding tissue, the patient was deemed acceptable for Mohs micrographic surgery (MMS).  The nature and purpose of the procedure, associated benefits and risks including recurrence and scarring, possible complications such as pain, infection, and bleeding, and alternative methods of treatment if appropriate were discussed with the patient during consent. The lesion location was verified by the patient, by reviewing previous notes, pathology reports, and by photographs as well as angulation measurements if available.  Informed consent was reviewed and signed by the patient, and timeout was performed at 8:15 AM. See op note below.  2. For the photodamage and solar lentigines, sun protection discussed/information given on OTC sunscreens, and we recommend continued regular follow-up with primary dermatologist every 6  months or sooner for any growing, bleeding, or changing lesions. 3. Prognosis and future surveillance discussed. 4. Letter with treatment outcome sent to referring provider. 5. 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 ChloraPrep applied. 4 section(s) excised using Mohs technique (this includes total peripheral and deep tissue margin excision and evaluation with frozen sections, excised and interpreted by the same physician). The tumor was first debulked and then excised with an approx. 2 mm margin.  Hemostasis was achieved with electrocautery as needed.  The specimen was then oriented, subdivided/relaxed, inked, and processed using Mohs technique. Tissue was stained with H&E and MART-1 with 2 chromogens (2 immunostains).    Frozen section analysis revealed poor tissue integrity, needing     STAGE II: An additional 2 mm margin was excised.  Hemostasis was achieved with electrocautery as needed.  The specimen was then oriented, subdivided/relaxed, inked, and processed using Mohs technique. Evaluation of slides by the Mohs surgeon revealed clear tumor margins. Tissue was stained with H&E and MART-1 with 2 chromogens (2 immunostains).  Reconstruction  The surgical wound was then cleaned, prepped, and re-anesthetized as above. Wound edges were undermined extensively along at least one entire edge and at a distance equal to or greater than the width of the  defect (see wound defect size above) in order to achieve closure and decrease wound tension and  anatomic distortion. Redundant tissue repair including standing cone removal was performed. Hemostasis was achieved with electrocautery. Subcutaneous and epidermal tissues were approximated with the above sutures. The surgical site was then lightly scrubbed with sterile, saline-soaked gauze. Steri-strips were applied, and the area was then bandaged using Vaseline ointment, non-adherent gauze, gauze pads, and tape to provide an adequate pressure dressing. The patient tolerated the procedure well, was given detailed written and verbal wound care instructions, and was discharged in good condition.   The patient will follow-up: 4 weeks.  I, Darice Smock, CMA, am acting as scribe for RUFUS CHRISTELLA HOLY, MD.   Documentation: I have reviewed the above documentation for accuracy and completeness, and I agree with the above.  RUFUS CHRISTELLA HOLY, MD

## 2024-05-29 NOTE — Patient Instructions (Signed)

## 2024-06-01 ENCOUNTER — Encounter: Payer: Self-pay | Admitting: Dermatology

## 2024-06-12 ENCOUNTER — Encounter: Payer: Self-pay | Admitting: Physician Assistant

## 2024-06-12 ENCOUNTER — Encounter: Payer: Self-pay | Admitting: Dermatology

## 2024-06-12 ENCOUNTER — Ambulatory Visit: Admitting: Physician Assistant

## 2024-06-12 ENCOUNTER — Ambulatory Visit: Admitting: Dermatology

## 2024-06-12 VITALS — BP 125/79

## 2024-06-12 DIAGNOSIS — Z1283 Encounter for screening for malignant neoplasm of skin: Secondary | ICD-10-CM | POA: Diagnosis not present

## 2024-06-12 DIAGNOSIS — S41101D Unspecified open wound of right upper arm, subsequent encounter: Secondary | ICD-10-CM

## 2024-06-12 DIAGNOSIS — L821 Other seborrheic keratosis: Secondary | ICD-10-CM

## 2024-06-12 DIAGNOSIS — Z85820 Personal history of malignant melanoma of skin: Secondary | ICD-10-CM

## 2024-06-12 DIAGNOSIS — D229 Melanocytic nevi, unspecified: Secondary | ICD-10-CM

## 2024-06-12 DIAGNOSIS — L905 Scar conditions and fibrosis of skin: Secondary | ICD-10-CM

## 2024-06-12 DIAGNOSIS — C439 Malignant melanoma of skin, unspecified: Secondary | ICD-10-CM

## 2024-06-12 DIAGNOSIS — T1490XD Injury, unspecified, subsequent encounter: Secondary | ICD-10-CM

## 2024-06-12 DIAGNOSIS — W908XXA Exposure to other nonionizing radiation, initial encounter: Secondary | ICD-10-CM

## 2024-06-12 DIAGNOSIS — L578 Other skin changes due to chronic exposure to nonionizing radiation: Secondary | ICD-10-CM

## 2024-06-12 DIAGNOSIS — D1801 Hemangioma of skin and subcutaneous tissue: Secondary | ICD-10-CM

## 2024-06-12 DIAGNOSIS — L814 Other melanin hyperpigmentation: Secondary | ICD-10-CM

## 2024-06-12 NOTE — Patient Instructions (Signed)

## 2024-06-12 NOTE — Progress Notes (Signed)
   Follow Up Visit   Subjective  [name] is a 83 y.o. female who presents for the following: follow up from Mohs surgery   The patient presents for follow up from Mohs surgery for a Malignant Melanoma on the right upper arm, treated on 05/29/2024, repaired with a complex linear closure. The patient has been bandaging the wound as directed. The endorse the following concerns: none.   The following portions of the chart were reviewed this encounter and updated as appropriate: medications, allergies, medical history  Review of Systems:  No other skin or systemic complaints except as noted in HPI or Assessment and Plan.  Objective  Well appearing patient in no apparent distress; mood and affect are within normal limits.  A focal examination was performed including the right upper arm. All findings within normal limits unless otherwise noted below.  Healing wound with mild erythema  Relevant physical exam findings are noted in the Assessment and Plan.    Assessment & Plan   Healing Wound s/p Mohs for a Malignant Melanoma on the right upper arm, treated on 05/29/2024, repaired with a complex linear closure - Reassured that wound is healing well - No evidence of infection - No swelling, induration, purulence, dehiscence, or tenderness out of proportion to the clinical exam, see photo above - Discussed that scars take up to 12 months to mature from the date of surgery - Recommend SPF 30+ to scar daily to prevent purple color from UV exposure during scar maturation process - Discussed that erythema and raised appearance of scar will fade over the next 4-6 months - OK to start scar massage at 4-6 weeks post-op - Can consider silicone based products for scar healing starting at 6 weeks post-op  HISTORY OF MELANOMA - No evidence of recurrence today - Recommend regular full body skin exams - Recommend daily broad spectrum sunscreen SPF 30+ to sun-exposed areas, reapply  every 2 hours as needed.  - Call if any new or changing lesions are noted between office visits  Return in 3 months (on 09/12/2024), or TBSE.  LILLETTE Rollene Gobble, RN, am acting as scribe for RUFUS CHRISTELLA HOLY, MD .   Documentation: I have reviewed the above documentation for accuracy and completeness, and I agree with the above.  RUFUS CHRISTELLA HOLY, MD

## 2024-06-12 NOTE — Patient Instructions (Signed)
 START 06/29/2024:      Post-Operative Scar Care: Education and Recommendations  Following your procedure, it's important to care for your scar to promote optimal healing and minimize its appearance. Proper post-operative care can help ensure that the scar heals well, and with time, it may become less noticeable. Below are key recommendations for scar care, including scar massage and the use of silicone scar gels or sheets.  1. General Scar Care Tips: -  Keep the wound clean and dry: Follow your healthcare provider's instructions for wound care, including cleaning the site and changing dressings as needed. -  Avoid sun exposure: Direct sunlight can darken scars and make them more noticeable. Once your wound has healed, apply sunscreen (SPF 30 or higher) to protect the scar from UV rays.  2. Scar Massage: - Start after healing: Wait until the scar has fully healed, with no scabs or open areas (usually 4-6 weeks after surgery). Your healthcare provider will give you specific guidance on when to begin. - Technique: Gently massage the scar in a circular motion for 5-10 minutes, 2-3 times per day. This helps to soften the tissue, reduce swelling, and improve the overall appearance of the scar. - Pressure: Apply gentle, firm pressure during the massage to break down the dense tissue that may form during healing. This helps to prevent the formation of keloids or hypertrophic scars. - Use lotion or ointment: Consider using a mild, fragrance-free lotion or vitamin E ointment to help lubricate the area during massage.  3. Silicone Scar Gels or Sheets: - When to start: Once your wound has healed completely, typically around 4-6 weeks, you can begin using silicone-based scar gels or sheets. These have been shown to improve scar appearance by hydrating the tissue and reducing inflammation. - How to use silicone gels: Apply a thin layer of the gel to the scar and allow it to dry before covering with clothing.  You can use the gel multiple times a day, depending on your provider's recommendation. - How to use silicone sheets: Cut the sheet to fit the size of your scar, and apply it directly to the healed scar. Wear it for 12-24 hours a day, and replace the sheet every few days as directed. - Benefits: Silicone helps reduce redness, flatten the scar, and improve its texture. Continued use over several months can lead to significant improvement in the appearance of the scar.  4. What to Expect: - Healing process: Scars generally take time to mature. The first few months may show redness or swelling, but this usually improves as healing progresses. - Long-term care: Scarring is a natural part of the healing process. While you cannot completely eliminate a scar, proper care can significantly improve its appearance over time. - Patience: It can take up to a year for a scar to fully mature, so it's important to be consistent with scar care and follow-up appointments with your provider.  5. When to Contact Your Healthcare Provider: - If you notice signs of infection (increased redness, warmth, drainage, or pain). - If your scar becomes unusually raised, itchy, or changes in color significantly. - If you have concerns about the appearance of your scar or experience unusual symptoms. - By following these guidelines, you can support your body's natural healing process and help ensure the best possible outcome for your scar. If you have any questions or concerns, please don't hesitate to contact our office.   Important Information  Due to recent changes in healthcare laws, you may  see results of your pathology and/or laboratory studies on MyChart before the doctors have had a chance to review them. We understand that in some cases there may be results that are confusing or concerning to you. Please understand that not all results are received at the same time and often the doctors may need to interpret multiple  results in order to provide you with the best plan of care or course of treatment. Therefore, we ask that you please give us  2 business days to thoroughly review all your results before contacting the office for clarification. Should we see a critical lab result, you will be contacted sooner.   If You Need Anything After Your Visit  If you have any questions or concerns for your doctor, please call our main line at [phone] If no one answers, please leave a voicemail as directed and we will return your call as soon as possible. Messages left after 4 pm will be answered the following business day.   You may also send us  a message via MyChart. We typically respond to MyChart messages within 1-2 business days.  For prescription refills, please ask your pharmacy to contact our office. Our fax number is [phone].  If you have an urgent issue when the clinic is closed that cannot wait until the next business day, you can page your doctor at the number below.    Please note that while we do our best to be available for urgent issues outside of office hours, we are not available 24/7.   If you have an urgent issue and are unable to reach us , you may choose to seek medical care at your doctor's office, retail clinic, urgent care center, or emergency room.  If you have a medical emergency, please immediately call 911 or go to the emergency department. In the event of inclement weather, please call our main line at [phone] for an update on the status of any delays or closures.  Dermatology Medication Tips: Please keep the boxes that topical medications come in in order to help keep track of the instructions about where and how to use these. Pharmacies typically print the medication instructions only on the boxes and not directly on the medication tubes.   If your medication is too expensive, please contact our office at [phone] or send us  a message through MyChart.   We are unable to  tell what your co-pay for medications will be in advance as this is different depending on your insurance coverage. However, we may be able to find a substitute medication at lower cost or fill out paperwork to get insurance to cover a needed medication.   If a prior authorization is required to get your medication covered by your insurance company, please allow us  1-2 business days to complete this process.  Drug prices often vary depending on where the prescription is filled and some pharmacies may offer cheaper prices.  The website www.goodrx.com contains coupons for medications through different pharmacies. The prices here do not account for what the cost may be with help from insurance (it may be cheaper with your insurance), but the website can give you the price if you did not use any insurance.  - You can print the associated coupon and take it with your prescription to the pharmacy.  - You may also stop by our office during regular business hours and pick up a GoodRx coupon card.  - If you need your prescription sent electronically to a different pharmacy, notify  our office through [facility] or by phone at [phone]

## 2024-06-12 NOTE — Progress Notes (Signed)
   Follow-Up Visit   Subjective  [name] is a 83 y.o. female who presents for the following: Skin Cancer Screening and Full Body Skin Exam - History of Melanoma of right upper arm (05/29/2024)  The patient presents for Total-Body Skin Exam (TBSE) for skin cancer screening and mole check. The patient has spots, moles and lesions to be evaluated, some may be new or changing and the patient may have concern these could be cancer.    The following portions of the chart were reviewed this encounter and updated as appropriate: medications, allergies, medical history  Review of Systems:  No other skin or systemic complaints except as noted in HPI or Assessment and Plan.  Objective  Well appearing patient in no apparent distress; mood and affect are within normal limits.  A full examination was performed including scalp, head, eyes, ears, nose, lips, neck, lymph nodes, chest, axillae, abdomen, back, buttocks, bilateral upper extremities, bilateral lower extremities, hands, feet, fingers, toes, fingernails, and toenails. All findings within normal limits unless otherwise noted below.   Relevant physical exam findings are noted in the Assessment and Plan.  No palpable cervical, axillary or inguinal lymphadenopathy.     Assessment & Plan   SKIN CANCER SCREENING PERFORMED TODAY.  ACTINIC DAMAGE - Chronic condition, secondary to cumulative UV/sun exposure - diffuse scaly erythematous macules with underlying dyspigmentation - Recommend daily broad spectrum sunscreen SPF 30+ to sun-exposed areas, reapply every 2 hours as needed.  - Staying in the shade or wearing long sleeves, sun glasses (UVA+UVB protection) and wide brim hats (4-inch brim around the entire circumference of the hat) are also recommended for sun protection.  - Call for new or changing lesions.  LENTIGINES, SEBORRHEIC KERATOSES, HEMANGIOMAS - Benign normal skin lesions - Benign-appearing - Call for any  changes  MELANOCYTIC NEVI - Tan-brown and/or pink-flesh-colored symmetric macules and papules - Benign appearing on exam today - Observation - Call clinic for new or changing moles - Recommend daily use of broad spectrum spf 30+ sunscreen to sun-exposed areas.   HISTORY OF MELANOMA - No evidence of recurrence today - No lymphadenopathy - Recommend regular full body skin exams - Recommend daily broad spectrum sunscreen SPF 30+ to sun-exposed areas, reapply every 2 hours as needed.  - Call if any new or changing lesions are noted between office visit   PERSONAL HISTORY OF MALIGNANT MELANOMA OF SKIN   SCREENING EXAM FOR SKIN CANCER   ACTINIC SKIN DAMAGE   LENTIGINES   SEBORRHEIC KERATOSIS   MULTIPLE BENIGN NEVI   CHERRY ANGIOMA   Return in about 3 months (around 09/12/2024) for TBSE, History of Melanoma.  I, Roseline Hutchinson, CMA, am acting as scribe for Kahdijah Errickson K, PA-C .   Documentation: I have reviewed the above documentation for accuracy and completeness, and I agree with the above.  Marelin Tat K, PA-C

## 2024-06-13 ENCOUNTER — Ambulatory Visit (HOSPITAL_COMMUNITY)
Admission: RE | Admit: 2024-06-13 | Discharge: 2024-06-13 | Disposition: A | Discharge status: Home / Self Care | Source: Ambulatory Visit | Attending: Physician Assistant | Admitting: Physician Assistant

## 2024-06-13 ENCOUNTER — Encounter (HOSPITAL_COMMUNITY): Payer: Self-pay | Admitting: Physician Assistant

## 2024-06-13 VITALS — BP 124/78 | HR 73 | Ht 66.0 in | Wt 162.0 lb

## 2024-06-13 DIAGNOSIS — I480 Paroxysmal atrial fibrillation: Secondary | ICD-10-CM | POA: Diagnosis not present

## 2024-06-13 DIAGNOSIS — D6869 Other thrombophilia: Secondary | ICD-10-CM

## 2024-06-13 NOTE — Patient Instructions (Addendum)
 [facility] Admission is scheduled qnm:Ulzdijb August 26th  Come to the Afib Clinic for pre-admission work up at: 9:00 AM   You may eat/drink prior to the appointment. Please take all your normal morning medications prior to arrival.   Medications that need to be held prior to admission date: FARXIGA  HOLD AS OF August 25th (resume after discharge)   If you should miss any doses of your blood thinner going forward please notify our office immediately.   If you start any new medications after TODAY please notify our office immediately.  No Benadryl use at least 3 days prior to admission.   We will start the authorization process with your insurance for the hospital stay.   You may bring a suitcase with belongings for your stay - clothes to keep you comfortable and things to keep you busy.   All medications will be provided for you while in the hospital.   Let me know if you have any questions regarding the above instructions or about the admission in general.   [facility] RN Afib Clinic [phone]

## 2024-06-13 NOTE — Progress Notes (Signed)
 Primary Care Physician: Leila Lucie LABOR, MD Primary Cardiologist: Peter Swaziland, MD Electrophysiologist: Danelle Birmingham, MD  Referring Physician: Daphne Barrack NP   [name] is a 83 y.o. female with a history of SSS s/p PPM, CHF, atrial fibrillation who presents for follow up in the [facility].  The patient was seen by Dr Nancey 04/12/24 with an increased burden of afib and acute CHF symptoms. Dofetilide  recommended. Patient is on Eliquis  for stroke prevention.    Patient presents today for follow up for atrial fibrillation. She remains in afib with symptoms of fatigue and intermittent dizziness. No bleeding issues on anticoagulation.   Today, she denies symptoms of palpitations, chest pain, shortness of breath, orthopnea, PND, lower extremity edema, presyncope, syncope, snoring, daytime somnolence, bleeding, or neurologic sequela. The patient is tolerating medications without difficulties and is otherwise without complaint today.    Atrial Fibrillation Risk Factors:  she does not have symptoms or diagnosis of sleep apnea. she does not have a history of rheumatic fever.   Atrial Fibrillation Management history:  Previous antiarrhythmic drugs: none Previous cardioversions: none Previous ablations: none Anticoagulation history: Eliquis   ROS- All systems are reviewed and negative except as per the HPI above.  Past Medical History:  Diagnosis Date   Allergy    Arthritis    CAD (coronary artery disease)    a. mild by prior cath; coronary CT 04/2019.   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 30 tablet 1   dapagliflozin  propanediol (FARXIGA ) 10 MG TABS tablet Take 1 tablet (10 mg total) by mouth daily before breakfast. 90 tablet 3   Evolocumab  (REPATHA  SURECLICK) 140 MG/ML SOAJ Inject 140 mg into the skin every 14 (fourteen) days. 2 mL 12   furosemide  (LASIX ) 20 MG tablet If blood pressure is below 110 do not take lasix . If blood pressure is 110-130 take 1 tablet (20 mg) by mouth of lasix . If blood pressure is above 130 take 2 tablets (40 mg) by mouth of lasix . 60 tablet 3   glipiZIDE  (GLUCOTROL ) 5 MG tablet Take by mouth 2 (two) times daily before a meal.     glucose blood (ONETOUCH ULTRA) test strip 2 (two) times daily.     lidocaine -prilocaine  (EMLA ) cream APPLY 1 APPLICATION TOPICALLY AS NEEDED 30 g 0   losartan  (COZAAR ) 50 MG tablet Take 1 tablet (50 mg total) by mouth daily. 90 tablet 3   pantoprazole  (PROTONIX ) 40 MG tablet Take 1  tablet (40 mg total) by mouth 2 (two) times daily before a meal. 60 tablet 2   potassium chloride  SA (KLOR-CON  M) 20 MEQ tablet TAKE 1 TABLET(20 MEQ) BY MOUTH DAILY 90 tablet 3   No current facility-administered medications for this encounter.    Physical Exam: BP 124/78   Pulse 73   Ht 5' 6 (1.676 m)   Wt 73.5 kg   BMI 26.15 kg/m   GEN: Well nourished, well developed in no acute distress CARDIAC: Regular rate and rhythm, no murmurs, rubs,  gallops RESPIRATORY:  Clear to auscultation without rales, wheezing or rhonchi  ABDOMEN: Soft, non-tender, non-distended EXTREMITIES:  No edema; No deformity   Wt Readings from Last 3 Encounters:  06/13/24 73.5 kg  05/03/24 73.9 kg  04/21/24 75.3 kg     EKG today demonstrates  V pacing with underlying afib Vent. rate 73 BPM PR interval * ms QRS duration 70 ms QT/QTcB 356/392 ms   Echo 08/25/23 demonstrated   1. Left ventricular ejection fraction, by estimation, is 60 to 65%. Left  ventricular ejection fraction by 3D volume is 60 %. The left ventricle has  normal function. The left ventricle has no regional wall motion  abnormalities. Left ventricular diastolic parameters are consistent with Grade II diastolic dysfunction (pseudonormalization). Elevated left ventricular end-diastolic pressure. The E/e' is 15.   2. Right ventricular systolic function is normal. The right ventricular  size is normal. There is normal pulmonary artery systolic pressure. The  estimated right ventricular systolic pressure is 25.1 mmHg.   3. Left atrial size was mildly dilated.   4. The mitral valve is grossly normal. Mild mitral valve regurgitation.   5. The aortic valve is tricuspid. Aortic valve regurgitation is not  visualized. Aortic valve sclerosis is present, with no evidence of aortic  valve stenosis.   6. The inferior vena cava is normal in size with greater than 50%  respiratory variability, suggesting right atrial pressure of 3 mmHg.   Comparison(s): Changes from prior study are noted. 05/08/2019: LVEF 60-65%,  normal diastolic function.     CHA2DS2-VASc Score = 8  The patient's score is based upon: CHF History: 0 HTN History: 1 Diabetes History: 1 Stroke History: 2 Vascular Disease History: 1 Age Score: 2 Gender Score: 1       ASSESSMENT AND PLAN: Paroxysmal Atrial Fibrillation (ICD10:  I48.0) The patient's CHA2DS2-VASc score is 8, indicating a 10.8% annual risk of stroke.    71% afib burden on device Patient would like to pursue dofetilide  admission Continue Eliquis  5 mg BID, states no missed doses in the last 3 weeks. No recent benadryl use PharmD to screen medications for QT prolonging agents. QTc in SR 449 ms Recent bmet reviewed, check magnesium  today.  Secondary Hypercoagulable State (ICD10:  D68.69) The patient is at significant risk for stroke/thromboembolism based upon her CHA2DS2-VASc Score of 8.  Continue Apixaban  (Eliquis ). No bleeding issues.   Chronic HFpEF EF 60-65% GDMT per primary cardiology team Fluid status appears stable today  HTN Stable on current regimen   Follow up in the AF clinic for dofetilide  loading.        [facility] [address] [place], [place] [address] [phone]

## 2024-06-14 ENCOUNTER — Ambulatory Visit (HOSPITAL_COMMUNITY): Payer: Self-pay | Admitting: Physician Assistant

## 2024-06-14 LAB — MAGNESIUM: Magnesium: 2.2 mg/dL (ref 1.6–2.3)

## 2024-06-17 ENCOUNTER — Other Ambulatory Visit: Payer: Self-pay | Admitting: Cardiology

## 2024-06-18 ENCOUNTER — Telehealth: Payer: Self-pay | Admitting: Pharmacist

## 2024-06-18 NOTE — Telephone Encounter (Signed)
 Medication list reviewed in anticipation of upcoming Tikosyn initiation. Patient is not taking any contraindicated medications.   Furosemide  can increase the risk of QTc prolongation due to its effects on electrolytes and kidney function. Close monitoring on potassium and magnesium should be done to ensure K remains between 4-5 and Magnesium >2.   Patient is anticoagulated on Eliquis  on the appropriate dose. Please ensure that patient has not missed any anticoagulation doses in the 3 weeks prior to Tikosyn initiation.   Patient will need to be counseled to avoid use of Benadryl while on Tikosyn and in the 2-3 days prior to Tikosyn initiation.

## 2024-06-21 ENCOUNTER — Telehealth: Payer: Self-pay | Admitting: Pharmacy Technician

## 2024-06-21 DIAGNOSIS — E782 Mixed hyperlipidemia: Secondary | ICD-10-CM

## 2024-06-21 NOTE — Telephone Encounter (Addendum)
 Spoke to patient advised your insurance requires lipid panel to be done before they approve Repatha .Orders placed for cmet,lipid panel.Stated she will have done tomorrow.

## 2024-06-21 NOTE — Addendum Note (Signed)
 Addended by: CHRISTIANNE CHANNING PARAS on: 06/21/2024 12:03 PM   Modules accepted: Orders

## 2024-06-21 NOTE — Telephone Encounter (Signed)
   Hi, the last labs that I can find are from 11/09/2023. [employer] team advantage is asking for labs from the last 120 days for this prior authorization. Can she get updated labs? Then we can send this prior authorization for repatha , thank you!

## 2024-06-22 ENCOUNTER — Telehealth: Payer: Self-pay | Admitting: *Deleted

## 2024-06-22 DIAGNOSIS — E785 Hyperlipidemia, unspecified: Secondary | ICD-10-CM

## 2024-06-22 DIAGNOSIS — E782 Mixed hyperlipidemia: Secondary | ICD-10-CM

## 2024-06-22 DIAGNOSIS — I2510 Atherosclerotic heart disease of native coronary artery without angina pectoris: Secondary | ICD-10-CM

## 2024-06-22 NOTE — Telephone Encounter (Signed)
 Order for cmet and lipids placed in the system and released accordingly.   Lab will be drawing these on the pt right now, as they called up to triage requesting orders to be placed, for the pt told them Dr. Swaziland ordered them on her yesterday to have done today.  Orders placed and [employer] can visualize those.

## 2024-06-23 ENCOUNTER — Telehealth (HOSPITAL_COMMUNITY): Payer: Self-pay | Admitting: *Deleted

## 2024-06-23 ENCOUNTER — Encounter (HOSPITAL_COMMUNITY): Payer: Self-pay

## 2024-06-23 LAB — LIPID PANEL
Chol/HDL Ratio: 1.4 ratio (ref 0.0–4.4)
Cholesterol, Total: 64 mg/dL — ABNORMAL LOW (ref 100–199)
HDL: 47 mg/dL (ref >39)
LDL Chol Calc (NIH): 3 mg/dL (ref 0–99)
Triglycerides: 59 mg/dL (ref 0–149)
VLDL Cholesterol Cal: 14 mg/dL (ref 5–40)

## 2024-06-23 LAB — COMPREHENSIVE METABOLIC PANEL WITH GFR
ALT: 24 IU/L (ref 0–32)
AST: 25 IU/L (ref 0–40)
Albumin: 4.1 g/dL (ref 3.7–4.7)
Alkaline Phosphatase: 100 IU/L (ref 44–121)
BUN/Creatinine Ratio: 21 (ref 12–28)
BUN: 19 mg/dL (ref 8–27)
Bilirubin Total: 0.9 mg/dL (ref 0.0–1.2)
CO2: 23 mmol/L (ref 20–29)
Calcium: 8.9 mg/dL (ref 8.7–10.3)
Chloride: 108 mmol/L — ABNORMAL HIGH (ref 96–106)
Creatinine, Ser: 0.89 mg/dL (ref 0.57–1.00)
Globulin, Total: 2.4 g/dL (ref 1.5–4.5)
Glucose: 119 mg/dL — ABNORMAL HIGH (ref 70–99)
Potassium: 4.3 mmol/L (ref 3.5–5.2)
Sodium: 145 mmol/L — ABNORMAL HIGH (ref 134–144)
Total Protein: 6.5 g/dL (ref 6.0–8.5)
eGFR: 65 mL/min/1.73 (ref >59)

## 2024-06-23 NOTE — Telephone Encounter (Signed)
 Hospital admission inpt date 8/26 approved through 8/30 auth number is [id number].

## 2024-06-25 ENCOUNTER — Ambulatory Visit: Payer: Self-pay | Admitting: Cardiology

## 2024-06-26 ENCOUNTER — Encounter: Payer: Self-pay | Admitting: Cardiology

## 2024-06-26 NOTE — Telephone Encounter (Signed)
 error

## 2024-06-26 NOTE — Telephone Encounter (Signed)
 Pharmacy Patient Advocate Encounter   Received notification from Pt Calls Messages that prior authorization for REPATHA  is required/requested.   Insurance verification completed.   The patient is insured through [facility] ADVANTAGE/RX ADVANCE .   Per test claim: PA required; PA submitted to above mentioned insurance via Latent Key/confirmation #/EOC [id number] Status is pending

## 2024-06-26 NOTE — Telephone Encounter (Signed)
 Repatha  plan is allowing 3mL for 28 days or 9mL for 84 days however request they have received is 6 ml for 30 day supply. They would like verification to move on with request.

## 2024-06-27 ENCOUNTER — Inpatient Hospital Stay (HOSPITAL_COMMUNITY)
Admission: RE | Admit: 2024-06-27 | Discharge: 2024-06-30 | DRG: 309 | Disposition: A | Discharge status: Home / Self Care | Source: Ambulatory Visit | Attending: Cardiovascular Disease | Admitting: Cardiovascular Disease

## 2024-06-27 ENCOUNTER — Other Ambulatory Visit (HOSPITAL_COMMUNITY): Payer: Self-pay

## 2024-06-27 ENCOUNTER — Other Ambulatory Visit: Payer: Self-pay

## 2024-06-27 ENCOUNTER — Encounter (HOSPITAL_COMMUNITY): Payer: Self-pay | Admitting: Cardiovascular Disease

## 2024-06-27 ENCOUNTER — Ambulatory Visit (HOSPITAL_COMMUNITY)
Admission: RE | Admit: 2024-06-27 | Discharge: 2024-06-27 | Disposition: A | Discharge status: Home / Self Care | Source: Ambulatory Visit | Attending: Physician Assistant | Admitting: Physician Assistant

## 2024-06-27 VITALS — BP 158/68 | HR 71 | Ht 66.0 in | Wt 165.4 lb

## 2024-06-27 DIAGNOSIS — I5032 Chronic diastolic (congestive) heart failure: Secondary | ICD-10-CM | POA: Diagnosis present

## 2024-06-27 DIAGNOSIS — Z9109 Other allergy status, other than to drugs and biological substances: Secondary | ICD-10-CM | POA: Diagnosis not present

## 2024-06-27 DIAGNOSIS — E1142 Type 2 diabetes mellitus with diabetic polyneuropathy: Secondary | ICD-10-CM | POA: Diagnosis present

## 2024-06-27 DIAGNOSIS — Z950 Presence of cardiac pacemaker: Secondary | ICD-10-CM | POA: Diagnosis not present

## 2024-06-27 DIAGNOSIS — Z7984 Long term (current) use of oral hypoglycemic drugs: Secondary | ICD-10-CM | POA: Diagnosis not present

## 2024-06-27 DIAGNOSIS — E785 Hyperlipidemia, unspecified: Secondary | ICD-10-CM | POA: Diagnosis present

## 2024-06-27 DIAGNOSIS — I252 Old myocardial infarction: Secondary | ICD-10-CM | POA: Diagnosis not present

## 2024-06-27 DIAGNOSIS — Z7901 Long term (current) use of anticoagulants: Secondary | ICD-10-CM | POA: Diagnosis not present

## 2024-06-27 DIAGNOSIS — Z85820 Personal history of malignant melanoma of skin: Secondary | ICD-10-CM | POA: Diagnosis not present

## 2024-06-27 DIAGNOSIS — I4819 Other persistent atrial fibrillation: Secondary | ICD-10-CM | POA: Diagnosis present

## 2024-06-27 DIAGNOSIS — I5030 Unspecified diastolic (congestive) heart failure: Secondary | ICD-10-CM

## 2024-06-27 DIAGNOSIS — I110 Hypertensive heart disease with heart failure: Secondary | ICD-10-CM | POA: Diagnosis present

## 2024-06-27 DIAGNOSIS — Z881 Allergy status to other antibiotic agents status: Secondary | ICD-10-CM | POA: Diagnosis not present

## 2024-06-27 DIAGNOSIS — Z79899 Other long term (current) drug therapy: Secondary | ICD-10-CM | POA: Diagnosis not present

## 2024-06-27 DIAGNOSIS — M797 Fibromyalgia: Secondary | ICD-10-CM | POA: Diagnosis present

## 2024-06-27 DIAGNOSIS — D6869 Other thrombophilia: Secondary | ICD-10-CM | POA: Diagnosis present

## 2024-06-27 DIAGNOSIS — Z8673 Personal history of transient ischemic attack (TIA), and cerebral infarction without residual deficits: Secondary | ICD-10-CM | POA: Diagnosis not present

## 2024-06-27 DIAGNOSIS — K219 Gastro-esophageal reflux disease without esophagitis: Secondary | ICD-10-CM | POA: Diagnosis present

## 2024-06-27 DIAGNOSIS — I4891 Unspecified atrial fibrillation: Secondary | ICD-10-CM | POA: Diagnosis not present

## 2024-06-27 DIAGNOSIS — Z885 Allergy status to narcotic agent status: Secondary | ICD-10-CM

## 2024-06-27 DIAGNOSIS — I2510 Atherosclerotic heart disease of native coronary artery without angina pectoris: Secondary | ICD-10-CM | POA: Diagnosis present

## 2024-06-27 DIAGNOSIS — I480 Paroxysmal atrial fibrillation: Principal | ICD-10-CM | POA: Diagnosis present

## 2024-06-27 LAB — BASIC METABOLIC PANEL WITH GFR
Anion gap: 7 (ref 5–15)
BUN/Creatinine Ratio: 29 — ABNORMAL HIGH (ref 12–28)
BUN: 24 mg/dL (ref 8–27)
BUN: 23 mg/dL (ref 8–23)
CO2: 24 mmol/L (ref 20–29)
CO2: 26 mmol/L (ref 22–32)
Calcium: 9.2 mg/dL (ref 8.7–10.3)
Calcium: 9.1 mg/dL (ref 8.9–10.3)
Chloride: 107 mmol/L — ABNORMAL HIGH (ref 96–106)
Chloride: 107 mmol/L (ref 98–111)
Creatinine, Ser: 0.83 mg/dL (ref 0.57–1.00)
Creatinine, Ser: 0.92 mg/dL (ref 0.44–1.00)
GFR, Estimated: >60 mL/min (ref >60)
Glucose, Bld: 117 mg/dL — ABNORMAL HIGH (ref 70–99)
Glucose: 140 mg/dL — ABNORMAL HIGH (ref 70–99)
Potassium: 4.3 mmol/L (ref 3.5–5.2)
Potassium: 4 mmol/L (ref 3.5–5.1)
Sodium: 144 mmol/L (ref 134–144)
Sodium: 140 mmol/L (ref 135–145)
eGFR: 71 mL/min/1.73 (ref >59)

## 2024-06-27 LAB — MAGNESIUM
Magnesium: 1.9 mg/dL (ref 1.6–2.3)
Magnesium: 1.8 mg/dL (ref 1.7–2.4)

## 2024-06-27 LAB — GLUCOSE, CAPILLARY
Glucose-Capillary: 139 mg/dL — ABNORMAL HIGH (ref 70–99)
Glucose-Capillary: 121 mg/dL — ABNORMAL HIGH (ref 70–99)

## 2024-06-27 MED ORDER — PANTOPRAZOLE SODIUM 40 MG PO TBEC
40.0000 mg | DELAYED_RELEASE_TABLET | Freq: Two times a day (BID) | ORAL | Status: DC
  Administered 2024-06-27 – 2024-06-30 (×6): 40 mg via ORAL
  Filled 2024-06-27 (×6): qty 1

## 2024-06-27 MED ORDER — ATORVASTATIN CALCIUM 80 MG PO TABS
80.0000 mg | ORAL_TABLET | Freq: Every evening | ORAL | Status: DC
Start: 2024-06-27 — End: 2024-06-30
  Administered 2024-06-27 – 2024-06-29 (×3): 80 mg via ORAL
  Filled 2024-06-27 (×3): qty 1

## 2024-06-27 MED ORDER — SODIUM CHLORIDE 0.9% FLUSH
3.0000 mL | Freq: Two times a day (BID) | INTRAVENOUS | Status: DC
  Administered 2024-06-27 – 2024-06-30 (×6): 3 mL via INTRAVENOUS

## 2024-06-27 MED ORDER — LOSARTAN POTASSIUM 50 MG PO TABS
50.0000 mg | ORAL_TABLET | Freq: Every day | ORAL | Status: DC
  Administered 2024-06-28 – 2024-06-30 (×3): 50 mg via ORAL
  Filled 2024-06-27 (×3): qty 1

## 2024-06-27 MED ORDER — MAGNESIUM SULFATE 2 GM/50ML IV SOLN
2.0000 g | Freq: Once | INTRAVENOUS | Status: AC
  Administered 2024-06-27: 2 g via INTRAVENOUS
  Filled 2024-06-27: qty 50

## 2024-06-27 MED ORDER — FUROSEMIDE 20 MG PO TABS
20.0000 mg | ORAL_TABLET | Freq: Every day | ORAL | Status: DC
  Administered 2024-06-28 – 2024-06-30 (×3): 20 mg via ORAL
  Filled 2024-06-27 (×3): qty 1

## 2024-06-27 MED ORDER — GLIPIZIDE 5 MG PO TABS
5.0000 mg | ORAL_TABLET | Freq: Two times a day (BID) | ORAL | Status: DC
  Administered 2024-06-27 – 2024-06-30 (×6): 5 mg via ORAL
  Filled 2024-06-27 (×7): qty 1

## 2024-06-27 MED ORDER — POTASSIUM CHLORIDE CRYS ER 20 MEQ PO TBCR
20.0000 meq | EXTENDED_RELEASE_TABLET | Freq: Every day | ORAL | Status: DC
  Administered 2024-06-28 – 2024-06-30 (×3): 20 meq via ORAL
  Filled 2024-06-27 (×3): qty 1

## 2024-06-27 MED ORDER — APIXABAN 5 MG PO TABS
5.0000 mg | ORAL_TABLET | Freq: Two times a day (BID) | ORAL | Status: DC
  Administered 2024-06-27 – 2024-06-30 (×6): 5 mg via ORAL
  Filled 2024-06-27 (×6): qty 1

## 2024-06-27 MED ORDER — SODIUM CHLORIDE 0.9% FLUSH
3.0000 mL | INTRAVENOUS | Status: DC | PRN
Start: 2024-06-27 — End: 2024-06-30

## 2024-06-27 MED ORDER — DOFETILIDE 250 MCG PO CAPS
250.0000 ug | ORAL_CAPSULE | Freq: Two times a day (BID) | ORAL | Status: DC
  Administered 2024-06-27 – 2024-06-30 (×6): 250 ug via ORAL
  Filled 2024-06-27 (×6): qty 1

## 2024-06-27 MED ORDER — SODIUM CHLORIDE 0.9 % IV SOLN: 250.0000 mL | INTRAVENOUS | Status: AC | PRN

## 2024-06-27 NOTE — Progress Notes (Addendum)
 Pharmacy: Dofetilide  (Tikosyn ) - Initial Consult Assessment and Electrolyte Replacement  Pharmacy consulted to assist in monitoring and replacing electrolytes in this 83 y.o. female admitted on 06/27/2024 undergoing dofetilide  initiation. First dofetilide  dose: 8/26 PM  Assessment:  Patient Exclusion Criteria: If any screening criteria checked as Yes, then  patient  should NOT receive dofetilide  until criteria item is corrected.  If "Yes" please indicate correction plan.  YES  NO Patient  Exclusion Criteria Correction Plan/Comments   []   [x]   Baseline QTc interval is greater than or equal to 440 msec. IF above YES box checked dofetilide  contraindicated unless patient has ICD; then may proceed if QTc 500-550 msec or with known ventricular conduction abnormalities may proceed with QTc 550-600 msec. QTc = 454 ms in SR per Afib clinic note    []   [x]   Patient is known or suspected to have a digoxin level greater than 2 ng/ml: No results found for: DIGOXIN     []   [x]   Creatinine clearance less than 20 ml/min (calculated using Cockcroft-Gault, actual body weight and serum creatinine): eCrCl = 57 mL/min (SCr used 0.9)     []   [x]  Patient has received drugs known to prolong the QT intervals within the last 48 hour (examples: phenothiazines, tricyclics or tetracyclic antidepressants, macrolides, 1st generation H-1 antihistamines (especially diphenhydramine), fluoroquinolones, azoles, ondansetron , metoclopramide , promethazine).   Updated information on QT prolonging agents is available to be searched on the following database:QT prolonging agents -If SSRI or antihistamine needed, preferred options are sertraline and loratadine respectively     []   [x]  Patient received a dose of a thiazide diuretic in the last 48 hours [including hydrochlorothiazide  (Oretic ) alone or in any combination including triamterene (Dyazide, Maxzide)].    []   [x]  Patient received a medication known to  increase dofetilide  plasma concentrations prior to initial dofetilide  dose:  Trimethoprim (Primsol, Proloprim) in the last 36 hours Verapamil  (Calan , Verelan ) in the last 36 hours or a sustained release dose in the last 72 hours Megestrol (Megace) in the last 5 days  Cimetidine (Tagamet) in the last 6 hours Ketoconazole (Nizoral) in the last 24 hours Itraconazole (Sporanox) in the last 48 hours  Prochlorperazine (Compazine) in the last 36 hours     []   [x]   Patient is known to have a history of torsades de pointes; congenital or acquired long QT syndromes.    []   [x]   Patient has received a Class 1 and Class 3 antiarrhythmic with less than 2 half-lives since last dose. (Disopyramide, Quinidine, Procainamide, Lidocaine , Mexiletine, Flecainide, Propafenone, Sotalol, Dronedarone)    []   [x]   Patient has received amiodarone therapy in the past 3 months or amiodarone level is greater than 0.3 ng/ml.    Labs:    Component Value Date/Time   K 4.0 06/27/2024 1153   MG 1.8 06/27/2024 1153     Plan: Select One Calculated CrCl  Dose q12h  []  > 60 ml/min 500 mcg  [x]  40-60 ml/min 250 mcg  []  20-40 ml/min 125 mcg   [x]   Physician selected initial dose within range recommended for patients level of renal function - will monitor for response.  []   Physician selected initial dose outside of range recommended for patients level of renal function - will discuss if the dose should be altered at this time.   Patient has been appropriately anticoagulated with Eliquis .  Potassium: K >/= 4: Appropriate to initiate Tikosyn , no replacement needed  on PTA KCl 20 mEq daily  Magnesium : Mg  1.8-2: Give Mg 2 gm IV x1 to prevent Mg from dropping below 1.8 - do not need to recheck Mg. Appropriate to initiate Tikosyn   Thank you for allowing pharmacy to participate in this patient's care   Maurilio Fila, PharmD Clinical Pharmacist 06/27/2024  1:27 PM

## 2024-06-27 NOTE — H&P (Signed)
 Primary Care Physician: Leila Lucie LABOR, MD Primary Cardiologist: Peter Swaziland, MD Electrophysiologist: Danelle Birmingham, MD  Referring Physician: Daphne Barrack NP   [name] is a 83 y.o. female with a history of SSS s/p PPM, CHF, atrial fibrillation who presents for follow up in the [facility].  The patient was seen by Dr Nancey 04/12/24 with an increased burden of afib and acute CHF symptoms. Dofetilide  recommended. Patient is on Eliquis  for stroke prevention.    Patient returns for follow up for atrial fibrillation and dofetilide  loading. She is atrial paced today. She denies any missed doses of anticoagulation in the past 3 weeks. No bleeding issues.   Today, she  denies symptoms of palpitations, chest pain, shortness of breath, orthopnea, PND, lower extremity edema, dizziness, presyncope, syncope, snoring, daytime somnolence, bleeding, or neurologic sequela. The patient is tolerating medications without difficulties and is otherwise without complaint today.    Atrial Fibrillation Risk Factors:  she does not have symptoms or diagnosis of sleep apnea. she does not have a history of rheumatic fever.   Atrial Fibrillation Management history:  Previous antiarrhythmic drugs: none Previous cardioversions: none Previous ablations: none Anticoagulation history: Eliquis   ROS- All systems are reviewed and negative except as per the HPI above.  Past Medical History:  Diagnosis Date   Allergy    Arthritis    CAD (coronary artery disease)    a. mild by prior cath; coronary CT 04/2019.   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   Hyperlipidemia    Hypertension    Internal and external hemorrhoids without  complication    Melanoma (HCC) 05/04/2024   Right upper arm - Mohs 05/29/2024 (Dr Corey)   Migraine with visual aura    Myocardial infarction (HCC)    PAF (paroxysmal atrial fibrillation) (HCC)    Potassium deficiency    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/09/2016   TIA (transient ischemic attack) 11/15/2015   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    Current Facility-Administered Medications  Medication Dose Route Frequency Provider Last Rate Last Admin   0.9 %  sodium chloride  infusion  250 mL Intravenous PRN Mealor, Augustus E, MD       apixaban  (ELIQUIS ) tablet 5 mg  5 mg Oral BID Mealor, Augustus E, MD       atorvastatin  (LIPITOR) tablet 80 mg  80 mg Oral QPM Fenton, Clint R, PA       dofetilide  (TIKOSYN ) capsule 250 mcg  250 mcg Oral BID Mealor, Augustus E, MD       [START ON 06/28/2024] furosemide  (LASIX ) tablet 20 mg  20 mg Oral Daily Fenton, Clint R, PA       glipiZIDE  (GLUCOTROL ) tablet 5 mg  5 mg Oral BID AC Fenton, Clint R, PA       [START ON 06/28/2024] losartan  (COZAAR ) tablet 50 mg  50 mg Oral Daily Fenton, Clint R, PA       pantoprazole  (PROTONIX ) EC tablet 40 mg  40 mg Oral BID AC Fenton, Clint R, PA       [START ON 06/28/2024] potassium chloride  SA (KLOR-CON  M) CR tablet 20 mEq  20 mEq Oral Daily Fenton, Clint R, PA  sodium chloride  flush (NS) 0.9 % injection 3 mL  3 mL Intravenous Q12H Mealor, Augustus E, MD       sodium chloride  flush (NS) 0.9 % injection 3 mL  3 mL Intravenous PRN Mealor, Augustus E, MD        Physical Exam: Temp 98.5 F (36.9 C) (Oral)   Ht 5' 6 (1.676 m)   Wt 73.9 kg   BMI 26.30 kg/m   GEN: Well nourished, well developed in no acute distress CARDIAC: Regular rate and rhythm, no murmurs, rubs, gallops RESPIRATORY:  Clear to auscultation without rales, wheezing or rhonchi  ABDOMEN: Soft, non-tender, non-distended EXTREMITIES:  No edema; No deformity    Wt Readings from Last 3 Encounters:  06/27/24 73.9 kg   06/27/24 75 kg  06/13/24 73.5 kg     EKG today demonstrates  A paced rhythm Vent. rate 71 BPM PR interval 224 ms QRS duration 76 ms QT/QTcB 418/454 ms   Echo 08/25/23 demonstrated   1. Left ventricular ejection fraction, by estimation, is 60 to 65%. Left  ventricular ejection fraction by 3D volume is 60 %. The left ventricle has  normal function. The left ventricle has no regional wall motion  abnormalities. Left ventricular diastolic parameters are consistent with Grade II diastolic dysfunction (pseudonormalization). Elevated left ventricular end-diastolic pressure. The E/e' is 15.   2. Right ventricular systolic function is normal. The right ventricular  size is normal. There is normal pulmonary artery systolic pressure. The  estimated right ventricular systolic pressure is 25.1 mmHg.   3. Left atrial size was mildly dilated.   4. The mitral valve is grossly normal. Mild mitral valve regurgitation.   5. The aortic valve is tricuspid. Aortic valve regurgitation is not  visualized. Aortic valve sclerosis is present, with no evidence of aortic  valve stenosis.   6. The inferior vena cava is normal in size with greater than 50%  respiratory variability, suggesting right atrial pressure of 3 mmHg.   Comparison(s): Changes from prior study are noted. 05/08/2019: LVEF 60-65%,  normal diastolic function.     CHA2DS2-VASc Score = 8  The patient's score is based upon: CHF History: 0 HTN History: 1 Diabetes History: 1 Stroke History: 2 Vascular Disease History: 1 Age Score: 2 Gender Score: 1       ASSESSMENT AND PLAN: Paroxysmal Atrial Fibrillation (ICD10:  I48.0) The patient's CHA2DS2-VASc score is 8, indicating a 10.8% annual risk of stroke.   Patient presents for dofetilide  admission Continue Eliquis  5 mg BID, states no missed doses in the last 3 weeks. No recent benadryl use PharmD has screened medications for QT prolonging agents.  QTc in SR 454 ms today Labs today  pending.   Secondary Hypercoagulable State (ICD10:  D68.69) The patient is at significant risk for stroke/thromboembolism based upon her CHA2DS2-VASc Score of 8.  Continue Apixaban  (Eliquis ). No bleeding issues.   Chronic HFpEF EF 60-65% GDMT per primary cardiology team Fluid status appears stable today  HTN Stable on current regimen   Pt presents for tikosyn  admission.   Ozell Jodie Passey, PA-C  06/27/2024 11:44 AM

## 2024-06-27 NOTE — Telephone Encounter (Signed)
 I sent 52ml/84ds    Pharmacy Patient Advocate Encounter  Received notification from [facility] [employer] that Prior Authorization for repatha  has been APPROVED from 06/26/24 to 06/26/25   PA #/Case ID/Reference #: [id number]

## 2024-06-27 NOTE — TOC Benefit Eligibility Note (Signed)
 Pharmacy Patient Advocate Encounter  Insurance verification completed.    The patient is insured through [employer]/ [employer].     Ran test claim for Dofetilide  125mcg (Tikosyn ) and the current 30 day co-pay is $0.   This test claim was processed through [facility]- copay amounts may vary at other pharmacies due to [employer], or as the patient moves through the different stages of their insurance plan.

## 2024-06-27 NOTE — Progress Notes (Signed)
 Primary Care Physician: Leila Lucie LABOR, MD Primary Cardiologist: Peter Swaziland, MD Electrophysiologist: Danelle Birmingham, MD  Referring Physician: Daphne Barrack NP   [name] is a 83 y.o. female with a history of SSS s/p PPM, CHF, atrial fibrillation who presents for follow up in the [facility].  The patient was seen by Dr Nancey 04/12/24 with an increased burden of afib and acute CHF symptoms. Dofetilide  recommended. Patient is on Eliquis  for stroke prevention.    Patient returns for follow up for atrial fibrillation and dofetilide  loading. She is atrial paced today. She denies any missed doses of anticoagulation in the past 3 weeks. No bleeding issues.   Today, she  denies symptoms of palpitations, chest pain, shortness of breath, orthopnea, PND, lower extremity edema, dizziness, presyncope, syncope, snoring, daytime somnolence, bleeding, or neurologic sequela. The patient is tolerating medications without difficulties and is otherwise without complaint today.    Atrial Fibrillation Risk Factors:  she does not have symptoms or diagnosis of sleep apnea. she does not have a history of rheumatic fever.   Atrial Fibrillation Management history:  Previous antiarrhythmic drugs: none Previous cardioversions: none Previous ablations: none Anticoagulation history: Eliquis   ROS- All systems are reviewed and negative except as per the HPI above.  Past Medical History:  Diagnosis Date   Allergy    Arthritis    CAD (coronary artery disease)    a. mild by prior cath; coronary CT 04/2019.   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   Hyperlipidemia    Hypertension    Internal and external hemorrhoids without  complication    Melanoma (HCC) 05/04/2024   Right upper arm - Mohs 05/29/2024 (Dr Corey)   Migraine with visual aura    Myocardial infarction (HCC)    PAF (paroxysmal atrial fibrillation) (HCC)    Potassium deficiency    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/09/2016   TIA (transient ischemic attack) 11/15/2015   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 30 tablet 1   Evolocumab  (REPATHA  SURECLICK) 140 MG/ML SOAJ ADMINISTER 1 ML UNDER SKIN EVERY 14 DAYS 6 mL 3   furosemide  (LASIX ) 20 MG tablet If blood pressure is below 110 do not take lasix . If blood pressure is 110-130 take 1 tablet (20 mg) by mouth of lasix . If blood pressure is above 130 take 2 tablets (40 mg) by mouth of lasix . 60 tablet 3   glipiZIDE  (GLUCOTROL ) 5 MG tablet Take by mouth 2 (two) times daily before a meal.     glucose blood (ONETOUCH ULTRA) test strip 2 (two) times daily.     lidocaine -prilocaine  (EMLA ) cream APPLY 1 APPLICATION TOPICALLY AS NEEDED 30 g 0   losartan  (COZAAR ) 50 MG tablet Take 1 tablet (50 mg total) by mouth daily. 90 tablet 3   pantoprazole  (PROTONIX ) 40 MG tablet Take 1 tablet (40 mg total) by mouth 2 (two) times daily before a meal. 60 tablet 2  potassium chloride  SA (KLOR-CON  M) 20 MEQ tablet TAKE 1 TABLET(20 MEQ) BY MOUTH DAILY 90 tablet 3   dapagliflozin  propanediol (FARXIGA ) 10 MG TABS tablet Take 1 tablet (10 mg total) by mouth daily before breakfast. (Patient not taking: Reported on 06/27/2024) 90 tablet 3   No current facility-administered medications for this encounter.    Physical Exam: BP (!) 158/68   Pulse 71   Ht 5' 6 (1.676 m)   Wt 75 kg   BMI 26.70 kg/m   GEN: Well nourished, well developed in no acute  distress CARDIAC: Regular rate and rhythm, no murmurs, rubs, gallops RESPIRATORY:  Clear to auscultation without rales, wheezing or rhonchi  ABDOMEN: Soft, non-tender, non-distended EXTREMITIES:  No edema; No deformity    Wt Readings from Last 3 Encounters:  06/27/24 75 kg  06/13/24 73.5 kg  05/03/24 73.9 kg     EKG today demonstrates  A paced rhythm Vent. rate 71 BPM PR interval 224 ms QRS duration 76 ms QT/QTcB 418/454 ms   Echo 08/25/23 demonstrated   1. Left ventricular ejection fraction, by estimation, is 60 to 65%. Left  ventricular ejection fraction by 3D volume is 60 %. The left ventricle has  normal function. The left ventricle has no regional wall motion  abnormalities. Left ventricular diastolic parameters are consistent with Grade II diastolic dysfunction (pseudonormalization). Elevated left ventricular end-diastolic pressure. The E/e' is 15.   2. Right ventricular systolic function is normal. The right ventricular  size is normal. There is normal pulmonary artery systolic pressure. The  estimated right ventricular systolic pressure is 25.1 mmHg.   3. Left atrial size was mildly dilated.   4. The mitral valve is grossly normal. Mild mitral valve regurgitation.   5. The aortic valve is tricuspid. Aortic valve regurgitation is not  visualized. Aortic valve sclerosis is present, with no evidence of aortic  valve stenosis.   6. The inferior vena cava is normal in size with greater than 50%  respiratory variability, suggesting right atrial pressure of 3 mmHg.   Comparison(s): Changes from prior study are noted. 05/08/2019: LVEF 60-65%,  normal diastolic function.     CHA2DS2-VASc Score = 8  The patient's score is based upon: CHF History: 0 HTN History: 1 Diabetes History: 1 Stroke History: 2 Vascular Disease History: 1 Age Score: 2 Gender Score: 1       ASSESSMENT AND PLAN: Paroxysmal Atrial Fibrillation (ICD10:  I48.0) The patient's CHA2DS2-VASc score is  8, indicating a 10.8% annual risk of stroke.   Patient presents for dofetilide  admission Continue Eliquis  5 mg BID, states no missed doses in the last 3 weeks. No recent benadryl use PharmD has screened medications for QT prolonging agents.  QTc in SR 454 ms today Labs today pending.   Secondary Hypercoagulable State (ICD10:  D68.69) The patient is at significant risk for stroke/thromboembolism based upon her CHA2DS2-VASc Score of 8.  Continue Apixaban  (Eliquis ). No bleeding issues.   Chronic HFpEF EF 60-65% GDMT per primary cardiology team Fluid status appears stable today  HTN Stable on current regimen   To be admitted later today once a bed becomes available.       [facility] [address] [place], [place] [address] [phone]

## 2024-06-27 NOTE — Plan of Care (Signed)
   Problem: Education: Goal: Knowledge of General Education information will improve Description Including pain rating scale, medication(s)/side effects and non-pharmacologic comfort measures Outcome: Progressing

## 2024-06-28 ENCOUNTER — Ambulatory Visit (HOSPITAL_COMMUNITY): Payer: Self-pay | Admitting: Physician Assistant

## 2024-06-28 DIAGNOSIS — I5030 Unspecified diastolic (congestive) heart failure: Secondary | ICD-10-CM | POA: Diagnosis not present

## 2024-06-28 DIAGNOSIS — I4819 Other persistent atrial fibrillation: Secondary | ICD-10-CM | POA: Diagnosis not present

## 2024-06-28 LAB — BASIC METABOLIC PANEL WITH GFR
Anion gap: 5 (ref 5–15)
BUN: 23 mg/dL (ref 8–23)
CO2: 24 mmol/L (ref 22–32)
Calcium: 8.6 mg/dL — ABNORMAL LOW (ref 8.9–10.3)
Chloride: 109 mmol/L (ref 98–111)
Creatinine, Ser: 1.09 mg/dL — ABNORMAL HIGH (ref 0.44–1.00)
GFR, Estimated: 51 mL/min — ABNORMAL LOW (ref >60)
Glucose, Bld: 104 mg/dL — ABNORMAL HIGH (ref 70–99)
Potassium: 3.8 mmol/L (ref 3.5–5.1)
Sodium: 138 mmol/L (ref 135–145)

## 2024-06-28 LAB — MAGNESIUM: Magnesium: 2.1 mg/dL (ref 1.7–2.4)

## 2024-06-28 MED ORDER — POTASSIUM CHLORIDE CRYS ER 20 MEQ PO TBCR
20.0000 meq | EXTENDED_RELEASE_TABLET | Freq: Once | ORAL | Status: AC
  Administered 2024-06-28: 20 meq via ORAL
  Filled 2024-06-28: qty 1

## 2024-06-28 NOTE — Plan of Care (Signed)

## 2024-06-28 NOTE — Progress Notes (Addendum)
 Pharmacy: Dofetilide  (Tikosyn ) - Follow Up Assessment and Electrolyte Replacement  Pharmacy consulted to assist in monitoring and replacing electrolytes in this 83 y.o. female admitted on 06/27/2024 undergoing dofetilide  initiation. First dofetilide  dose: 250 mcg 8/26 PM  Labs:    Component Value Date/Time   K 3.8 06/28/2024 0358   MG 2.1 06/28/2024 0358     Plan: Potassium: K 3.8-3.9:  Give KCl 40 mEq po x1 (20 mEq in addition to PTA 20 mEq daily ordered)  Magnesium : Mg > 2: No additional supplementation needed  Thank you for allowing pharmacy to participate in this patient's care   Maurilio Fila, PharmD Clinical Pharmacist 06/28/2024  7:10 AM

## 2024-06-28 NOTE — Progress Notes (Signed)
   06/28/24 0935  Spiritual Encounters  Type of Visit Initial  Care provided to: Patient  Referral source Clinical staff  Reason for visit Advance directives  OnCall Visit No  Spiritual Framework  Presenting Themes Impactful experiences and emotions  Community/Connection Family  Patient Stress Factors Health changes  Family Stress Factors None identified  Interventions  Spiritual Care Interventions Made Compassionate presence;Established relationship of care and support  Intervention Outcomes  Outcomes Connection to spiritual care;Awareness of health;Awareness of support;Reduced anxiety   Chaplain visited Pt at bedside, Pt was awake, alert and receptive to conversation. Chaplain initiated discussion regarding Advance Directive. Pt reported that an (AD) is already complete, naming her husband at [facility].   Pt expressed concern that her husband is old, and may not able to make sound healthcare decisions on her behalf. Pt stated she would like prefer her son [name]), to be designated for future healthcare decision making. Chaplain provided emotional  support and remained available for further assistance.

## 2024-06-28 NOTE — Progress Notes (Signed)
 Mobility Specialist Progress Note;   06/28/24 1340  Mobility  Activity Ambulated with assistance  Level of Assistance Contact guard assist, steadying assist  Assistive Device None  Distance Ambulated (ft) 400 ft  Activity Response Tolerated well  Mobility Referral Yes  Mobility visit 1 Mobility  Mobility Specialist Start Time (ACUTE ONLY) 1340  Mobility Specialist Stop Time (ACUTE ONLY) 1355  Mobility Specialist Time Calculation (min) (ACUTE ONLY) 15 min   Pt agreeable to mobility. Required light MinG assistance for safety during ambulation d/t some unsteadiness on feet. Pt states this is d/t neuropathy. HR up to 107 bpm w/ activity. Slight SOB, however SPO2 92%>. Pt returned back to bed and left with all needs met, call bell in reach.   Lauraine Erm Mobility Specialist Please contact via SecureChat or [employer] [phone]

## 2024-06-28 NOTE — Progress Notes (Signed)
   Electrophysiology Rounding Note  Patient Name: [name] Date of Encounter: 06/28/2024  Primary Cardiologist: Peter Swaziland, MD  Electrophysiologist: Danelle Birmingham, MD    Subjective   Pt remains in NSR on Tikosyn  250 mcg BID   QTc from EKG last pm shows stable QTc at ~430-440  The patient is doing well today.  At this time, the patient denies chest pain, shortness of breath, or any new concerns.  Inpatient Medications    Scheduled Meds:  apixaban   5 mg Oral BID   atorvastatin   80 mg Oral QPM   dofetilide   250 mcg Oral BID   furosemide   20 mg Oral Daily   glipiZIDE   5 mg Oral BID AC   losartan   50 mg Oral Daily   pantoprazole   40 mg Oral BID AC   potassium chloride  SA  20 mEq Oral Daily   potassium chloride   20 mEq Oral Once   sodium chloride  flush  3 mL Intravenous Q12H   Continuous Infusions:  sodium chloride      PRN Meds: sodium chloride , sodium chloride  flush   Vital Signs    Vitals:   06/27/24 1613 06/27/24 1941 06/27/24 2302 06/28/24 0400  BP: 120/64 137/67 (!) 143/64 (!) 141/61  Pulse: 69 70 70 70  Resp: 18 19 16 18   Temp: 97.8 F (36.6 C) 98 F (36.7 C) 98 F (36.7 C) 98.3 F (36.8 C)  TempSrc: Oral Oral Oral Oral  SpO2: 99% 96% 98% 97%  Weight:      Height:        Intake/Output Summary (Last 24 hours) at 06/28/2024 9287 Last data filed at 06/27/2024 1400 Gross per 24 hour  Intake 0 ml  Output --  Net 0 ml   Filed Weights   06/27/24 1110  Weight: 73.9 kg    Physical Exam    GEN- NAD, A&O x 3. Normal affect.  Lungs- CTAB, Normal effort.  Heart- Regular rate and rhythm. No M/G/R GI- Soft, NT, ND Extremities- No clubbing, cyanosis, or edema Skin- no rash or lesion  Labs    CBC No results for input(s): WBC, NEUTROABS, HGB, HCT, MCV, PLT in the last 72 hours. Basic Metabolic Panel Recent Labs    91/73/74 1153 06/28/24 0358  NA 140 138  K 4.0 3.8  CL 107 109  CO2 26 24  GLUCOSE 117* 104*  BUN 23 23   CREATININE 0.92 1.09*  CALCIUM  9.1 8.6*  MG 1.8 2.1    Telemetry    AP-VS rhythm 60-70s (personally reviewed)  Patient Profile     [name] is a 83 y.o. female with a past medical history significant for persistent atrial fibrillation.  They were admitted for tikosyn  load.   Assessment & Plan    Persistent atrial fibrillation Pt remains in NSR on Tikosyn  250 mcg BID  Continue Eliquis  Creatinine, ser  1.09* (08/27 0358) Magnesium   2.1 (08/27 0358) Potassium3.8 (08/27 0358) Supplement K  Plan for home Friday if QTc remains stable.   For questions or updates, please contact [facility] Please consult [email or web address] for contact info under Cardiology/STEMI.  Signed, Ozell Prentice Passey, PA-C  06/28/2024, 7:12 AM

## 2024-06-28 NOTE — Progress Notes (Signed)
 Morning EKG reviewed     Shows remains in NSR with stable QTc at ~450 ms.  Continue  Tikosyn  250 mcg BID.   Potassium3.8 (08/27 0358) Magnesium   2.1 (08/27 0358) Creatinine, ser  1.09* (08/27 0358)  Plan for home Friday if QTc remains stable   [name], [place]  06/28/2024 10:54 AM

## 2024-06-29 ENCOUNTER — Encounter (HOSPITAL_COMMUNITY)
Admission: RE | Disposition: A | Discharge status: Home / Self Care | Payer: Self-pay | Source: Ambulatory Visit | Attending: Cardiovascular Disease

## 2024-06-29 DIAGNOSIS — I4819 Other persistent atrial fibrillation: Secondary | ICD-10-CM | POA: Diagnosis not present

## 2024-06-29 LAB — BASIC METABOLIC PANEL WITH GFR
Anion gap: 6 (ref 5–15)
BUN: 21 mg/dL (ref 8–23)
CO2: 23 mmol/L (ref 22–32)
Calcium: 8.6 mg/dL — ABNORMAL LOW (ref 8.9–10.3)
Chloride: 110 mmol/L (ref 98–111)
Creatinine, Ser: 0.74 mg/dL (ref 0.44–1.00)
GFR, Estimated: >60 mL/min (ref >60)
Glucose, Bld: 97 mg/dL (ref 70–99)
Potassium: 3.9 mmol/L (ref 3.5–5.1)
Sodium: 139 mmol/L (ref 135–145)

## 2024-06-29 LAB — MAGNESIUM: Magnesium: 2.1 mg/dL (ref 1.7–2.4)

## 2024-06-29 SURGERY — CARDIOVERSION (CATH LAB)
Anesthesia: General

## 2024-06-29 MED ORDER — POTASSIUM CHLORIDE CRYS ER 20 MEQ PO TBCR
20.0000 meq | EXTENDED_RELEASE_TABLET | Freq: Once | ORAL | Status: AC
  Administered 2024-06-29: 20 meq via ORAL
  Filled 2024-06-29: qty 1

## 2024-06-29 NOTE — Progress Notes (Signed)
 Pharmacy: Dofetilide  (Tikosyn ) - Follow Up Assessment and Electrolyte Replacement  Pharmacy consulted to assist in monitoring and replacing electrolytes in this 83 y.o. female admitted on 06/27/2024 undergoing dofetilide  initiation. First dofetilide  dose: 250 mcg 8/26 PM  Labs:    Component Value Date/Time   K 3.9 06/29/2024 0432   MG 2.1 06/29/2024 0432     Plan: Potassium: K 3.8-3.9:  Give KCl 40 mEq po x1 (20 mEq in addition to PTA 20 mEq daily ordered)  Magnesium : Mg > 2: No additional supplementation needed  Thank you for allowing pharmacy to participate in this patient's care   Maurilio Fila, PharmD Clinical Pharmacist 06/29/2024  7:03 AM

## 2024-06-29 NOTE — TOC CM/SW Note (Signed)
 Transition of Care [facility]) - Inpatient Brief Assessment   Patient Details  Name: Chrysa Rampy MRN: [id number] Date of Birth: 1940-11-22  [facility]) CM/SW Contact:    Sudie Erminio Deems, RN Phone Number: 06/29/2024, 1:34 PM   Clinical Narrative: Patient presented for Tikosyn  Load. Case Manager spoke with the patient regarding co pay cost. Patient is agreeable to cost and would like to have the initial Rx filled via [facility] and the Rx refills 90 day supply escribed to [facility] [place]. No further needs identified at this time.  Transition of Care Asessment: Insurance and Status: Insurance coverage has been reviewed Patient has primary care physician: Yes Home environment has been reviewed: reviewed Prior level of function:: indpendent Prior/Current Home Services: No current home services Social Drivers of Health Review: SDOH reviewed no interventions necessary Readmission risk has been reviewed: Yes Transition of care needs: no transition of care needs at this time

## 2024-06-29 NOTE — Progress Notes (Addendum)
   Electrophysiology Rounding Note  Patient Name: [name] Date of Encounter: 06/29/2024  Primary Cardiologist: Peter Swaziland, MD  Electrophysiologist: Danelle Birmingham, MD    Subjective   Pt atrial paced, ventricular sensed on Tikosyn  250 mcg BID   QTc from EKG last pm shows stable QTc at ~470  The patient is doing well today.  At this time, the patient denies chest pain, shortness of breath, or any new concerns.  Inpatient Medications    Scheduled Meds:  apixaban   5 mg Oral BID   atorvastatin   80 mg Oral QPM   dofetilide   250 mcg Oral BID   furosemide   20 mg Oral Daily   glipiZIDE   5 mg Oral BID AC   losartan   50 mg Oral Daily   pantoprazole   40 mg Oral BID AC   potassium chloride  SA  20 mEq Oral Daily   sodium chloride  flush  3 mL Intravenous Q12H   Continuous Infusions:  PRN Meds: sodium chloride  flush   Vital Signs    Vitals:   06/28/24 1710 06/28/24 2042 06/28/24 2309 06/29/24 0433  BP: (!) 147/70 136/65 (!) 130/57 136/64  Pulse:  70 69 70  Resp: 18 18 16 19   Temp: 98.2 F (36.8 C) 98.2 F (36.8 C) 98 F (36.7 C) 97.8 F (36.6 C)  TempSrc: Oral Oral Oral Oral  SpO2: 100% 98% 98% 94%  Weight:      Height:       No intake or output data in the 24 hours ending 06/29/24 0830 Filed Weights   06/27/24 1110  Weight: 73.9 kg    Physical Exam    GEN- NAD, A&O x 3. Normal affect.  Lungs- CTAB, Normal effort.  Heart- Regular rate and rhythm. No M/G/R GI- Soft, NT, ND Extremities- No clubbing, cyanosis, or edema Skin- no rash or lesion  Labs    CBC No results for input(s): WBC, NEUTROABS, HGB, HCT, MCV, PLT in the last 72 hours. Basic Metabolic Panel Recent Labs    91/72/74 0358 06/29/24 0432  NA 138 139  K 3.8 3.9  CL 109 110  CO2 24 23  GLUCOSE 104* 97  BUN 23 21  CREATININE 1.09* 0.74  CALCIUM  8.6* 8.6*  MG 2.1 2.1    Telemetry    A paced V sensed with HR in the 70s (personally reviewed)  Patient Profile      [name] is a 83 y.o. female with a past medical history significant for persistent atrial fibrillation.  They were admitted for tikosyn  load.   Assessment & Plan    Persistent atrial fibrillation Pt A paced, V sensed on Tikosyn  250 mcg BID  Continue Eliquis  Creatinine, ser  0.74 (08/28 0432) Magnesium   2.1 (08/28 0432) Potassium3.9 (08/28 0432) Supplement K  Plan for home Friday if QTc remains stable.   For questions or updates, please contact [facility] Please consult [email or web address] for contact info under Cardiology/STEMI.  Signed, Artist Pouch, PA-C  06/29/2024, 8:30 AM   EP Attending  Patient seen and examined. Agree with the findings as above. CV with a RRR and lungs are clear. Tele with atrial pacing. She is maintaining NSR on dofetilide  and she has been atrial pacing with an acceptable QT interval around 470. If no change, plan for DC home tomorrow.  Danelle Chayne Baumgart,MD

## 2024-06-29 NOTE — Progress Notes (Signed)
 Morning EKG reviewed     Shows remains in NSR with stable QTc at 459 ms when manually calculated.  Continue  Tikosyn  250 mcg BID.   Potassium3.9 (08/28 0432) Magnesium   2.1 (08/28 0432) Creatinine, ser  0.74 (08/28 0432)  Plan for home Friday if QTc remains stable   Artist Pouch, PA-C  06/29/2024 10:54 AM

## 2024-06-29 NOTE — Progress Notes (Signed)
 Mobility Specialist Progress Note;   06/29/24 0934  Mobility  Activity Ambulated with assistance  Level of Assistance Contact guard assist, steadying assist  Assistive Device None  Distance Ambulated (ft) 400 ft  Activity Response Tolerated well  Mobility Referral Yes  Mobility visit 1 Mobility  Mobility Specialist Start Time (ACUTE ONLY) 0934  Mobility Specialist Stop Time (ACUTE ONLY) 0947  Mobility Specialist Time Calculation (min) (ACUTE ONLY) 13 min   Pt agreeable to mobility. Required light MinG assistance during ambulation for safety. HR up to 109 bpm w/ activity. C/o some SOB, however SPO2 95%. Pt sitting on EoB and left with all needs met, speaking w/ MD.   Lauraine Erm Mobility Specialist Please contact via SecureChat or Rehab Office [phone]

## 2024-06-30 ENCOUNTER — Other Ambulatory Visit (HOSPITAL_COMMUNITY): Payer: Self-pay

## 2024-06-30 DIAGNOSIS — I4819 Other persistent atrial fibrillation: Secondary | ICD-10-CM | POA: Diagnosis not present

## 2024-06-30 LAB — BASIC METABOLIC PANEL WITH GFR
Anion gap: 10 (ref 5–15)
BUN: 24 mg/dL — ABNORMAL HIGH (ref 8–23)
CO2: 22 mmol/L (ref 22–32)
Calcium: 8.7 mg/dL — ABNORMAL LOW (ref 8.9–10.3)
Chloride: 107 mmol/L (ref 98–111)
Creatinine, Ser: 0.8 mg/dL (ref 0.44–1.00)
GFR, Estimated: >60 mL/min (ref >60)
Glucose, Bld: 119 mg/dL — ABNORMAL HIGH (ref 70–99)
Potassium: 4.2 mmol/L (ref 3.5–5.1)
Sodium: 139 mmol/L (ref 135–145)

## 2024-06-30 LAB — MAGNESIUM: Magnesium: 2 mg/dL (ref 1.7–2.4)

## 2024-06-30 MED ORDER — DOFETILIDE 250 MCG PO CAPS
250.0000 ug | ORAL_CAPSULE | Freq: Two times a day (BID) | ORAL | 6 refills | Status: DC
  Filled 2024-06-30: qty 60, 30d supply, fill #0

## 2024-06-30 MED ORDER — DOFETILIDE 250 MCG PO CAPS: 250.0000 ug | ORAL_CAPSULE | Freq: Two times a day (BID) | ORAL | 6 refills | Status: DC

## 2024-06-30 MED ORDER — MAGNESIUM SULFATE 2 GM/50ML IV SOLN
2.0000 g | Freq: Once | INTRAVENOUS | Status: AC
  Administered 2024-06-30: 2 g via INTRAVENOUS
  Filled 2024-06-30: qty 50

## 2024-06-30 NOTE — Care Management Important Message (Signed)
 Important Message  Patient Details  Name: [name] MRN: [id number] Date of Birth: 23-Sep-1941   Important Message Given:  Yes - Medicare IM     [name] 06/30/2024, 11:10 AM

## 2024-06-30 NOTE — Progress Notes (Signed)
 Volunteer called for [employer] med and discharge

## 2024-06-30 NOTE — Discharge Summary (Signed)
 ELECTROPHYSIOLOGY DISCHARGE SUMMARY    Patient ID: [name],  MRN: [id number], DOB/AGE: 81-Oct-1942 81 y.o.  Admit date: 06/27/2024 Discharge date: 06/30/2024  Primary Care Physician: Leila Lucie LABOR, MD  Primary Cardiologist: Peter Swaziland, MD  Electrophysiologist: Dr. Nancey   Primary Discharge Diagnosis:  Persistent atrial fibrillation status post Tikosyn  loading this admission  Allergies  Allergen Reactions   Citalopram      Serum Sickness (ALLERGY) Serotonin Syndrome   Codeine Other (See Comments)    hallucinations   Cymbalta [Duloxetine Hcl] Other (See Comments)    Serotonin Syndrome   Metformin And Related     Causes dehydration and causes me to go to the hospital   Paroxetine Hcl     Serotonin Syndrome   Trazodone And Nefazodone Other (See Comments)    Serotonin Syndrome   Amlodipine  Swelling    Pitting edema - significant ankle edema limiting ability to walk   Caffeine Anxiety    Nervousness      Procedures This Admission:  1.  Tikosyn  loading   Brief HPI: [name] is a 83 y.o. female with a past medical history as noted above.  They were referred to EP for treatment options of atrial fibrillation.  Risks, benefits, and alternatives to Tikosyn  were reviewed with the patient who wished to proceed with admission for loading.  Hospital Course:  The patient was admitted and Tikosyn  was initiated.  Renal function and electrolytes were followed during the hospitalization.  Their QTc remained stable. The patient presented in sinus rhythm and did not require cardioversion. The patients QTc remained stable. They were monitored on telemetry up to discharge. On the day of discharge, they were examined by EP Team  who considered them stable for discharge to home.  Follow-up has been arranged with the Atrial Fibrillation clinic in approximately 1 week.   Physical Exam: Vitals:   06/29/24 2030 06/29/24 2327 06/30/24 0429 06/30/24 0737   BP: 132/67 (!) 123/46 130/62 134/61  Pulse: 74 70    Resp: 16 15 18 17   Temp: 98.1 F (36.7 C) 98.3 F (36.8 C) 98 F (36.7 C) 98.4 F (36.9 C)  TempSrc: Oral Oral Oral Oral  SpO2: 95% 97% 98% 99%  Weight:      Height:        GEN- NAD, A&O x 3. Normal affect.  Lungs- CTAB, Normal effort.  Heart- Regular rate and rhythm. No M/G/R GI- Soft, NT, ND Extremities- No clubbing, cyanosis, or edema Skin- no rash or lesion  Labs:   Lab Results  Component Value Date   WBC 4.4 08/02/2023   HGB 13.3 08/02/2023   HCT 41.0 08/02/2023   MCV 90 08/02/2023   PLT 212 08/02/2023    Recent Labs  Lab 06/30/24 0426  NA 139  K 4.2  CL 107  CO2 22  BUN 24*  CREATININE 0.80  CALCIUM  8.7*  GLUCOSE 119*    Discharge Medications:  Allergies as of 06/30/2024       Reactions   Citalopram     Serum Sickness (ALLERGY) Serotonin Syndrome   Codeine Other (See Comments)   hallucinations   Cymbalta [duloxetine Hcl] Other (See Comments)   Serotonin Syndrome   Metformin And Related    Causes dehydration and causes me to go to the hospital   Paroxetine Hcl    Serotonin Syndrome   Trazodone And Nefazodone Other (See Comments)   Serotonin Syndrome   Amlodipine  Swelling   Pitting edema -  significant ankle edema limiting ability to walk   Caffeine Anxiety   Nervousness         Medication List     TAKE these medications    apixaban  5 MG Tabs tablet Commonly known as: Eliquis  TAKE 1 TABLET(5 MG) BY MOUTH TWICE DAILY   atorvastatin  80 MG tablet Commonly known as: LIPITOR Take 1 tablet (80 mg total) by mouth every evening.   dapagliflozin  propanediol 10 MG Tabs tablet Commonly known as: Farxiga  Take 1 tablet (10 mg total) by mouth daily before breakfast.   dofetilide  250 MCG capsule Commonly known as: TIKOSYN  Take 1 capsule (250 mcg total) by mouth 2 (two) times daily.   furosemide  20 MG tablet Commonly known as: LASIX  If blood pressure is below 110 do not take lasix . If  blood pressure is 110-130 take 1 tablet (20 mg) by mouth of lasix . If blood pressure is above 130 take 2 tablets (40 mg) by mouth of lasix . What changed:  how much to take how to take this when to take this additional instructions   glipiZIDE  5 MG tablet Commonly known as: GLUCOTROL  Take 5 mg by mouth 2 (two) times daily before a meal.   lidocaine -prilocaine  cream Commonly known as: EMLA  APPLY 1 APPLICATION TOPICALLY AS NEEDED What changed: reasons to take this   losartan  50 MG tablet Commonly known as: COZAAR  Take 1 tablet (50 mg total) by mouth daily.   pantoprazole  40 MG tablet Commonly known as: PROTONIX  Take 1 tablet (40 mg total) by mouth 2 (two) times daily before a meal.   potassium chloride  SA 20 MEQ tablet Commonly known as: KLOR-CON  M TAKE 1 TABLET(20 MEQ) BY MOUTH DAILY What changed:  how much to take how to take this when to take this additional instructions   Repatha  SureClick 140 MG/ML Soaj Generic drug: Evolocumab  ADMINISTER 1 ML UNDER SKIN EVERY 14 DAYS        Disposition:  Home with follow up in AF clinic in 1 week as in AVS.   Duration of Discharge Encounter:  APP time: 24 minutes  Signed, Ozell Prentice Passey, PA-C  06/30/2024 11:44 AM

## 2024-06-30 NOTE — Progress Notes (Signed)
 Pt verbally understands discharge plan of care.  TOC med at pharmacy.  Pt drove vehicle and ok to drive home per [employer] and and [facility].  Patient alert oriented in good spirits. Independent ambulatory.  PIV and tele removed. Tele placed in slot and signed in at nurse station.  Dwayne from [facility] called.

## 2024-06-30 NOTE — Progress Notes (Signed)
 EKG from yesterday evening 06/29/2024 reviewed     Shows remains in NSR with stable QTc   Continue  Tikosyn  250 mcg BID.   Potassium4.2 (08/29 0426) Magnesium   2.0 (08/29 0426) - supp ordered Creatinine, ser  0.80 (08/29 0426)  Plan for home this afternoon if QTc remains stable.   Ozell Prentice Passey, PA-C  06/30/2024 8:03 AM

## 2024-06-30 NOTE — Discharge Instructions (Signed)
 Dofetilide  Capsules What is this medication? DOFETILIDE  (doe FET il ide) treats a fast or irregular heartbeat (arrhythmia). It works by slowing down overactive electric signals in the heart, which stabilizes your heart rhythm. It belongs to a group of medications called antiarrhythmics. This medicine may be used for other purposes; ask your health care provider or pharmacist if you have questions. COMMON BRAND NAME(S): Tikosyn  What should I tell my care team before I take this medication? They need to know if you have any of these conditions: Heart disease History of irregular heartbeat History of low levels of potassium or magnesium  in the blood Kidney disease Liver disease An unusual or allergic reaction to dofetilide , other medications, foods, dyes, or preservatives Pregnant or trying to get pregnant Breast-feeding How should I use this medication? Take this medication by mouth with a glass of water. Follow the directions on the prescription label. Do not take with grapefruit juice. You can take it with or without food. If it upsets your stomach, take it with food. Take your medication at regular intervals. Do not take it more often than directed. Do not stop taking except on your care team's advice. A special MedGuide will be given to you by the pharmacist with each prescription and refill. Be sure to read this information carefully each time. Talk to your care team about the use of this medication in children. Special care may be needed. Overdosage: If you think you have taken too much of this medicine contact a poison control center or emergency room at once. NOTE: This medicine is only for you. Do not share this medicine with others. What if I miss a dose? If you miss a dose, skip it. Take your next dose at the normal time. Do not take extra or 2 doses at the same time to make up for the missed dose. What may interact with this medication? Do not take this medication with any of  the following: Benadryl (Diphenhydramine) Cimetidine (Tagamet) Cisapride Dolutegravir Dronedarone Erdafitinib Hydrochlorothiazide Immodium Ketoconazole Megestrol Pimozide Prochlorperazine Thioridazine Trimethoprim Verapamil This medication may also interact with the following: Amiloride Cannabinoids Certain antibiotics like erythromycin or clarithromycin  Certain antiviral medications for HIV or hepatitis Certain medications for depression, anxiety, or psychotic disorders Digoxin  Diltiazem  Grapefruit juice Metformin  Nefazodone Other medications that prolong the QT interval (an abnormal heart rhythm) Quinine Triamterene Zafirlukast Ziprasidone This list may not describe all possible interactions. Give your health care provider a list of all the medicines, herbs, non-prescription drugs, or dietary supplements you use. Also tell them if you smoke, drink alcohol, or use illegal drugs. Some items may interact with your medicine. What should I watch for while using this medication? Your condition will be monitored carefully while you are receiving this medication. What side effects may I notice from receiving this medication? Side effects that you should report to your care team as soon as possible: Allergic reactions--skin rash, itching, hives, swelling of the face, lips, tongue, or throat Chest pain Heart rhythm changes--fast or irregular heartbeat, dizziness, feeling faint or lightheaded, chest pain, trouble breathing Side effects that usually do not require medical attention (report to your care team if they continue or are bothersome): Dizziness Headache Nausea Stomach pain Trouble sleeping This list may not describe all possible side effects. Call your doctor for medical advice about side effects. You may report side effects to FDA at 1-800-FDA-1088. Where should I keep my medication? Keep out of the reach of children. Store at room temperature between 15  and 30  degrees C (59 and 86 degrees F). Throw away any unused medication after the expiration date. NOTE: This sheet is a summary. It may not cover all possible information. If you have questions about this medicine, talk to your doctor, pharmacist, or health care provider.  2024 Elsevier/Gold Standard (2021-09-19 00:00:00)

## 2024-06-30 NOTE — Progress Notes (Signed)
 Pharmacy: Dofetilide  (Tikosyn ) - Follow Up Assessment and Electrolyte Replacement  Pharmacy consulted to assist in monitoring and replacing electrolytes in this 83 y.o. female admitted on 06/27/2024 undergoing dofetilide  initiation. First dofetilide  dose: 250 mcg 8/26 PM  Labs:    Component Value Date/Time   K 4.2 06/30/2024 0426   MG 2.0 06/30/2024 0426     Plan: Potassium: K >/= 4: No additional supplementation needed on PTA 20 mEq KCl daily  Magnesium : Mg 1.8-2: Give Mg 2 gm IV x1   Thank you for allowing pharmacy to participate in this patient's care   Maurilio Fila, PharmD Clinical Pharmacist 06/30/2024  7:10 AM

## 2024-06-30 NOTE — Plan of Care (Signed)

## 2024-07-06 ENCOUNTER — Ambulatory Visit (INDEPENDENT_AMBULATORY_CARE_PROVIDER_SITE_OTHER)

## 2024-07-06 DIAGNOSIS — I4891 Unspecified atrial fibrillation: Secondary | ICD-10-CM | POA: Diagnosis not present

## 2024-07-07 LAB — CUP PACEART REMOTE DEVICE CHECK
Battery Remaining Longevity: 19 mo
Battery Voltage: 2.91 V
Brady Statistic AP VP Percent: 0.25 %
Brady Statistic AP VS Percent: 70.14 %
Brady Statistic AS VP Percent: 22.39 %
Brady Statistic AS VS Percent: 7.23 %
Brady Statistic RA Percent Paced: 65.75 %
Brady Statistic RV Percent Paced: 22.18 %
Date Time Interrogation Session: 20250904083000
Implantable Lead Connection Status: 753985
Implantable Lead Connection Status: 753985
Implantable Lead Implant Date: 20070910
Implantable Lead Implant Date: 20070910
Implantable Lead Location: 753859
Implantable Lead Location: 753860
Implantable Lead Model: 5076
Implantable Lead Model: 5076
Implantable Pulse Generator Implant Date: 20170608
Lead Channel Impedance Value: 304 Ohm
Lead Channel Impedance Value: 380 Ohm
Lead Channel Impedance Value: 399 Ohm
Lead Channel Impedance Value: 418 Ohm
Lead Channel Pacing Threshold Amplitude: 1 V
Lead Channel Pacing Threshold Amplitude: 1 V
Lead Channel Pacing Threshold Pulse Width: 0.4 ms
Lead Channel Pacing Threshold Pulse Width: 0.4 ms
Lead Channel Sensing Intrinsic Amplitude: 1.25 mV
Lead Channel Sensing Intrinsic Amplitude: 1.25 mV
Lead Channel Sensing Intrinsic Amplitude: 9.5 mV
Lead Channel Sensing Intrinsic Amplitude: 9.5 mV
Lead Channel Setting Pacing Amplitude: 2 V
Lead Channel Setting Pacing Amplitude: 2.5 V
Lead Channel Setting Pacing Pulse Width: 0.4 ms
Lead Channel Setting Sensing Sensitivity: 2.8 mV
Zone Setting Status: 755011
Zone Setting Status: 755011

## 2024-07-09 ENCOUNTER — Ambulatory Visit: Payer: Self-pay | Admitting: Internal Medicine

## 2024-07-10 ENCOUNTER — Encounter (HOSPITAL_COMMUNITY): Payer: Self-pay | Admitting: Internal Medicine

## 2024-07-10 ENCOUNTER — Ambulatory Visit (HOSPITAL_COMMUNITY)
Admit: 2024-07-10 | Discharge: 2024-07-10 | Disposition: A | Discharge status: Home / Self Care | Attending: Internal Medicine | Admitting: Internal Medicine

## 2024-07-10 VITALS — BP 112/66 | HR 75 | Ht 66.0 in | Wt 161.2 lb

## 2024-07-10 DIAGNOSIS — Z79899 Other long term (current) drug therapy: Secondary | ICD-10-CM | POA: Diagnosis not present

## 2024-07-10 DIAGNOSIS — Z5181 Encounter for therapeutic drug level monitoring: Secondary | ICD-10-CM

## 2024-07-10 DIAGNOSIS — I480 Paroxysmal atrial fibrillation: Secondary | ICD-10-CM

## 2024-07-10 DIAGNOSIS — D6869 Other thrombophilia: Secondary | ICD-10-CM | POA: Diagnosis not present

## 2024-07-10 MED ORDER — DOFETILIDE 250 MCG PO CAPS: 250.0000 ug | ORAL_CAPSULE | Freq: Two times a day (BID) | ORAL | 1 refills | Status: AC

## 2024-07-10 NOTE — Progress Notes (Signed)
 Primary Care Physician: Leila Lucie LABOR, MD Primary Cardiologist: Peter Swaziland, MD Electrophysiologist: Danelle Birmingham, MD  Referring Physician: Daphne Barrack NP   [name] is a 83 y.o. female with a history of SSS s/p PPM, CHF, atrial fibrillation who presents for follow up in the [facility].  The patient was seen by Dr Nancey 04/12/24 with an increased burden of afib and acute CHF symptoms. Dofetilide  recommended. Patient is on Eliquis  for stroke prevention.    On follow up 07/10/24, patient is here for 1 week Tikosyn  surveillance. S/p Tikosyn  admission 8/26-29/2025. Patient presented in SR and did not require cardioversion. Qtc remained stable and discharged on 250 mcg BID.   Today, she  denies symptoms of palpitations, chest pain, shortness of breath, orthopnea, PND, lower extremity edema, dizziness, presyncope, syncope, snoring, daytime somnolence, bleeding, or neurologic sequela. The patient is tolerating medications without difficulties and is otherwise without complaint today.    Atrial Fibrillation Risk Factors:  she does not have symptoms or diagnosis of sleep apnea. she does not have a history of rheumatic fever.   Atrial Fibrillation Management history:  Previous antiarrhythmic drugs: tikosyn  Previous cardioversions: none Previous ablations: none Anticoagulation history: Eliquis   ROS- All systems are reviewed and negative except as per the HPI above.  Past Medical History:  Diagnosis Date   Allergy    Arthritis    CAD (coronary artery disease)    a. mild by prior cath; coronary CT 04/2019.   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   Hyperlipidemia    Hypertension    Internal and external  hemorrhoids without complication    Melanoma (HCC) 05/04/2024   Right upper arm - Mohs 05/29/2024 (Dr Corey)   Migraine with visual aura    Myocardial infarction (HCC)    PAF (paroxysmal atrial fibrillation) (HCC)    Potassium deficiency    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/09/2016   TIA (transient ischemic attack) 11/15/2015   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 30 tablet 1   dapagliflozin  propanediol (FARXIGA ) 10 MG TABS tablet Take 1 tablet (10 mg total) by mouth daily before breakfast. 90 tablet 3   dofetilide  (TIKOSYN ) 250 MCG capsule Take 1 capsule (250 mcg total) by mouth 2 (two) times daily. 60 capsule 6   Evolocumab  (REPATHA  SURECLICK) 140 MG/ML SOAJ ADMINISTER 1 ML UNDER SKIN EVERY 14 DAYS 6 mL 3   furosemide  (LASIX ) 20 MG tablet If blood pressure is below 110 do not take lasix . If blood pressure is 110-130 take 1 tablet (20 mg) by mouth of lasix . If blood pressure is above 130 take 2 tablets (40 mg) by mouth of lasix . (Patient taking differently: Take 20 mg by mouth daily.) 60 tablet 3   glipiZIDE  (GLUCOTROL ) 5 MG tablet Take 5 mg by mouth 2 (two) times daily before a meal.     lidocaine -prilocaine  (EMLA ) cream APPLY 1 APPLICATION TOPICALLY AS NEEDED (Patient taking differently:  Apply 1 Application topically as needed (leg pain).) 30 g 0   losartan  (COZAAR ) 50 MG tablet Take 1 tablet (50 mg total) by mouth daily. 90 tablet 3   pantoprazole  (PROTONIX ) 40 MG tablet Take 1 tablet (40 mg total) by mouth 2 (two) times daily before a meal. 60 tablet 2   potassium chloride  SA (KLOR-CON  M) 20 MEQ tablet TAKE 1 TABLET(20 MEQ) BY MOUTH DAILY (Patient taking differently: Take 20 mEq by mouth every evening.) 90 tablet  3   No current facility-administered medications for this visit.    Physical Exam: There were no vitals taken for this visit.  GEN- The patient is well appearing, alert and oriented x 3 today.   Neck - no JVD or carotid bruit noted Lungs- Clear to ausculation bilaterally, normal work of breathing Heart- Regular rate and rhythm, no murmurs, rubs or gallops, PMI not laterally displaced Extremities- no clubbing, cyanosis, or edema Skin - no rash or ecchymosis noted   Wt Readings from Last 3 Encounters:  06/27/24 73.9 kg  06/27/24 75 kg  06/13/24 73.5 kg     EKG today demonstrates  Vent. rate 75 BPM PR interval 236 ms QRS duration 68 ms QT/QTcB 406/453 ms P-R-T axes * 99 52 Atrial-paced rhythm with prolonged AV conduction Rightward axis Nonspecific T wave abnormality Abnormal ECG When compared with ECG of 30-Jun-2024 10:55, Previous ECG is present   Echo 08/25/23 demonstrated   1. Left ventricular ejection fraction, by estimation, is 60 to 65%. Left  ventricular ejection fraction by 3D volume is 60 %. The left ventricle has  normal function. The left ventricle has no regional wall motion  abnormalities. Left ventricular diastolic parameters are consistent with Grade II diastolic dysfunction (pseudonormalization). Elevated left ventricular end-diastolic pressure. The E/e' is 15.   2. Right ventricular systolic function is normal. The right ventricular  size is normal. There is normal pulmonary artery systolic pressure. The  estimated right ventricular systolic pressure is 25.1 mmHg.   3. Left atrial size was mildly dilated.   4. The mitral valve is grossly normal. Mild mitral valve regurgitation.   5. The aortic valve is tricuspid. Aortic valve regurgitation is not  visualized. Aortic valve sclerosis is present, with no evidence of aortic  valve stenosis.   6. The inferior vena cava is normal in size with greater than 50%  respiratory variability, suggesting right atrial  pressure of 3 mmHg.   Comparison(s): Changes from prior study are noted. 05/08/2019: LVEF 60-65%,  normal diastolic function.     CHA2DS2-VASc Score = 8  The patient's score is based upon: CHF History: 0 HTN History: 1 Diabetes History: 1 Stroke History: 2 Vascular Disease History: 1 Age Score: 2 Gender Score: 1       ASSESSMENT AND PLAN: Paroxysmal Atrial Fibrillation (ICD10:  I48.0) The patient's CHA2DS2-VASc score is 8, indicating a 10.8% annual risk of stroke.   S/p Tikosyn  admission 8/26-29/2025.  Patient is currently in A paced rhythm. Tikosyn  teaching revisited.   High risk medication monitoring (ICD10: [id number]) Patient requires ongoing monitoring for anti-arrhythmic medication which has the potential to cause life threatening arrhythmias or AV block. Qtc stable. Continue Tikosyn  250 mcg BID. Bmet and mag drawn today.  Secondary Hypercoagulable State (ICD10:  D68.69) The patient is at significant risk for stroke/thromboembolism based upon her CHA2DS2-VASc Score of 8.  Continue Apixaban  (Eliquis ). Continue Eliquis .   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  HTN Stable today.  Follow up in 1 month for Tikosyn  surveillance with Dr. Waddell.       Dorn Heinrich, [facility] [address] [place], [place] [address] [phone]

## 2024-07-11 ENCOUNTER — Ambulatory Visit (HOSPITAL_COMMUNITY): Payer: Self-pay | Admitting: Internal Medicine

## 2024-07-11 LAB — MAGNESIUM: Magnesium: 2.1 mg/dL (ref 1.6–2.3)

## 2024-07-11 LAB — BASIC METABOLIC PANEL WITH GFR
BUN/Creatinine Ratio: 23 (ref 12–28)
BUN: 22 mg/dL (ref 8–27)
CO2: 22 mmol/L (ref 20–29)
Calcium: 9.3 mg/dL (ref 8.7–10.3)
Chloride: 104 mmol/L (ref 96–106)
Creatinine, Ser: 0.95 mg/dL (ref 0.57–1.00)
Glucose: 178 mg/dL — ABNORMAL HIGH (ref 70–99)
Potassium: 3.9 mmol/L (ref 3.5–5.2)
Sodium: 141 mmol/L (ref 134–144)
eGFR: 60 mL/min/1.73 (ref >59)

## 2024-07-15 NOTE — Progress Notes (Signed)
 Remote PPM Transmission

## 2024-07-29 ENCOUNTER — Other Ambulatory Visit: Payer: Self-pay | Admitting: Pulmonary Disease

## 2024-08-03 ENCOUNTER — Ambulatory Visit: Admitting: Podiatry

## 2024-08-07 ENCOUNTER — Ambulatory Visit: Attending: Internal Medicine | Admitting: Internal Medicine

## 2024-08-07 VITALS — BP 132/72 | HR 72 | Ht 66.0 in | Wt 162.0 lb

## 2024-08-07 DIAGNOSIS — I495 Sick sinus syndrome: Secondary | ICD-10-CM

## 2024-08-07 DIAGNOSIS — I480 Paroxysmal atrial fibrillation: Secondary | ICD-10-CM | POA: Diagnosis not present

## 2024-08-07 LAB — CUP PACEART INCLINIC DEVICE CHECK
Date Time Interrogation Session: 20251006104659
Implantable Lead Connection Status: 753985
Implantable Lead Connection Status: 753985
Implantable Lead Implant Date: 20070910
Implantable Lead Implant Date: 20070910
Implantable Lead Location: 753859
Implantable Lead Location: 753860
Implantable Lead Model: 5076
Implantable Lead Model: 5076
Implantable Pulse Generator Implant Date: 20170608
Lead Channel Impedance Value: 456 Ohm
Lead Channel Impedance Value: 513 Ohm
Lead Channel Pacing Threshold Amplitude: 0.75 V
Lead Channel Pacing Threshold Amplitude: 1 V
Lead Channel Pacing Threshold Pulse Width: 0.4 ms
Lead Channel Pacing Threshold Pulse Width: 0.4 ms
Lead Channel Sensing Intrinsic Amplitude: 17.9 mV
Lead Channel Sensing Intrinsic Amplitude: 2.3 mV

## 2024-08-07 NOTE — Patient Instructions (Addendum)

## 2024-08-07 NOTE — Progress Notes (Signed)
 HPI The patient returns today for followup of atrial fib. She is a pleasant 83 yo woman with sinus node dysfunction and PAF. She was started on dofetilide  about 6 weeks ago. In the interim she notes that she has done well. No angina. She has had some trouble sleeping but no chest pain. She is in the process of selling her place in [place].   Allergies  Allergen Reactions   Citalopram      Serum Sickness (ALLERGY) Serotonin Syndrome   Cymbalta [Duloxetine Hcl] Other (See Comments)    Serotonin Syndrome   Metformin And Related     Causes dehydration and causes me to go to the hospital   Paroxetine Hcl     Serotonin Syndrome   Trazodone And Nefazodone Other (See Comments)    Serotonin Syndrome   Amlodipine  Swelling    Pitting edema - significant ankle edema limiting ability to walk   Caffeine Anxiety    Nervousness    Codeine Other (See Comments)    hallucinations     Current Outpatient Medications  Medication Sig Dispense Refill   apixaban  (ELIQUIS ) 5 MG TABS tablet TAKE 1 TABLET(5 MG) BY MOUTH TWICE DAILY 60 tablet 11   atorvastatin  (LIPITOR) 80 MG tablet Take 1 tablet (80 mg total) by mouth every evening. 30 tablet 1   dapagliflozin  propanediol (FARXIGA ) 10 MG TABS tablet Take 1 tablet (10 mg total) by mouth daily before breakfast. 90 tablet 3   dofetilide  (TIKOSYN ) 250 MCG capsule Take 1 capsule (250 mcg total) by mouth 2 (two) times daily. 180 capsule 1   Evolocumab  (REPATHA  SURECLICK) 140 MG/ML SOAJ ADMINISTER 1 ML UNDER SKIN EVERY 14 DAYS 6 mL 3   furosemide  (LASIX ) 20 MG tablet If blood pressure is below 110 do not take lasix . If blood pressure is 110-130 take 1 tablet (20 mg) by mouth of lasix . If blood pressure is above 130 take 2 tablets (40 mg) by mouth of lasix . 60 tablet 3   glipiZIDE  (GLUCOTROL ) 5 MG tablet Take 5 mg by mouth 2 (two) times daily before a meal.     lidocaine -prilocaine  (EMLA ) cream APPLY 1 APPLICATION TOPICALLY AS NEEDED 30 g 0   losartan   (COZAAR ) 50 MG tablet Take 1 tablet (50 mg total) by mouth daily. 90 tablet 3   pantoprazole  (PROTONIX ) 40 MG tablet Take 1 tablet (40 mg total) by mouth 2 (two) times daily before a meal. 60 tablet 2   potassium chloride  SA (KLOR-CON  M) 20 MEQ tablet TAKE 1 TABLET(20 MEQ) BY MOUTH DAILY 90 tablet 3   No current facility-administered medications for this visit.     Past Medical History:  Diagnosis Date   Allergy    Arthritis    CAD (coronary artery disease)    a. mild by prior cath; coronary CT 04/2019.   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   Past Surgical History:  Procedure Laterality Date   ABDOMINAL HYSTERECTOMY     APPENDECTOMY     bil carple tunnel surgery     CARDIAC CATHETERIZATION N/A 04/09/2015   Procedure: Left Heart Cath and Coronary Angiography;  Surgeon: Peter M Swaziland, MD;  Location: [facility] INVASIVE CV LAB;  Service: Cardiovascular;  Laterality: N/A;   COLONOSCOPY  multiple   EP IMPLANTABLE DEVICE N/A 04/09/2016   Procedure: PPM Generator Changeout;  Surgeon: Danelle LELON Birmingham, MD;  Location: [facility] INVASIVE CV LAB;  Service: Cardiovascular;  Laterality: N/A;   ESOPHAGEAL MANOMETRY N/A 08/02/2017   Procedure:  ESOPHAGEAL MANOMETRY (EM);  Surgeon: Shila Gustav GAILS, MD;  Location: [facility] ENDOSCOPY;  Service: Endoscopy;  Laterality: N/A;   KNEE SURGERY     right x 2   NECK SURGERY     x2   PACEMAKER INSERTION     PH IMPEDANCE STUDY N/A 08/02/2017   Procedure: PH IMPEDANCE STUDY;  Surgeon: Shila Gustav GAILS, MD;  Location: [facility] ENDOSCOPY;  Service: Endoscopy;  Laterality: N/A;   UPPER GASTROINTESTINAL ENDOSCOPY       Family History  Problem Relation Age of Onset   Coronary artery disease Father    Prostate cancer Father    Colon cancer Father    Lung cancer Father    Diabetes Father    Heart attack Father    Diabetes Daughter    Dementia Daughter    Colon cancer Sister        x 2   Lung cancer Mother    Dementia Mother    Colon polyps Sister    Crohn's disease Sister    Diabetes Maternal Aunt        multiple   Breast cancer Maternal Aunt    Colon cancer Brother    Kidney disease Daughter    Diabetes Cousin        multiple   Diabetes Maternal Uncle        multiple   Irritable bowel syndrome Sister    Heart attack Daughter    Heart attack Brother    Hypertension Neg Hx    Esophageal cancer Neg Hx    Rectal cancer Neg Hx    Stomach cancer Neg Hx      Social History   Socioeconomic History   Marital status: Married    Spouse name: james   Number of children: 2   Years of education: 12   Highest education level: Not on file  Occupational History   Occupation: Retired    Associate Professor: RETIRED  Tobacco Use   Smoking status: Never   Smokeless tobacco: Never   Tobacco comments:    Never smoked 06/13/24  Vaping Use   Vaping status: Never Used  Substance and Sexual Activity   Alcohol use: No    Alcohol/week: 0.0 standard drinks of alcohol   Drug use: No   Sexual activity: Not Currently  Other Topics Concern   Not on file  Social History Narrative   Patient is married with 2 children. She lives at home with her husband.    Patient is right handed.   Patient has 12 th  grade education.   Patient drinks occasional caffeine.   Social Drivers of Dispensing optician  Resource Strain: Medium Risk (07/10/2022)   Received from [facility] visits prior to 01/02/2023., [facility]   Overall Financial Resource Strain (CARDIA)    Difficulty of Paying Living Expenses: Somewhat hard  Food Insecurity: Low Risk  (07/26/2024)   Received from [facility]   Hunger Vital Sign    Within the past 12 months, you worried that your food would run out before you got money to buy more: Never true    Within the past 12 months, the food you bought just didn't last and you didn't have money to get more. : Never true  Transportation Needs: No Transportation Needs (07/26/2024)   Received from [facility]    In the past 12 months, has lack of reliable transportation kept you from medical appointments, meetings, work or from getting things needed for daily living? : No  Physical Activity: Insufficiently Active (07/10/2022)   Received [facility], [facility] visits prior to 01/02/2023.   Exercise Vital Sign    On average, how many days per week do you engage in moderate to strenuous exercise (like a brisk walk)?: 4 days    On average, how many minutes do you engage in exercise at this level?: 30 min  Stress: No Stress Concern Present (07/10/2022)   Received from [facility], [facility] visits prior to 01/02/2023.   [employer] of Occupational Health - Occupational Stress Questionnaire    Feeling of Stress : Only a little  Social Connections: Socially Integrated (06/27/2024)   Social Connection and Isolation Panel    Frequency of Communication with Friends and Family: More than three times a week    Frequency of Social Gatherings with Friends and Family: More than three times a week    Attends Religious Services: More than 4 times per year    Active Member of [employer] or Organizations: Yes     Attends Engineer, structural: More than 4 times per year    Marital Status: Married  Catering manager Violence: Not At Risk (06/27/2024)   Humiliation, Afraid, Rape, and Kick questionnaire    Fear of Current or Ex-Partner: No    Emotionally Abused: No    Physically Abused: No    Sexually Abused: No     BP 132/72   Pulse 72   Ht 5' 6 (1.676 m)   Wt 162 lb (73.5 kg)   SpO2 97%   BMI 26.15 kg/m   Physical Exam:  Well appearing NAD HEENT: Unremarkable Neck:  No JVD, no thyromegally Lymphatics:  No adenopathy Back:  No CVA tenderness Lungs:  Clear HEART:  Regular rate rhythm, no murmurs, no rubs, no clicks Abd:  soft, positive bowel sounds, no organomegally, no rebound, no guarding Ext:  2 plus pulses, no edema, no cyanosis, no clubbing Skin:  No rashes no nodules Neuro:  CN II through XII intact, motor grossly intact  EKG - NSR with atrial pacing  DEVICE  Normal device function.  See PaceArt for details.   Assess/Plan: PAF - she is maintaining NSR. She will continue dofetilide . QT is ok.  Coags - she has not had any bleeding.  SND - she is s/p PPM and her [employer] DDD PM is working normally. HTN - her bp is stable.   Danelle Ambra Haverstick,MD

## 2024-08-16 ENCOUNTER — Ambulatory Visit: Admitting: Physician Assistant

## 2024-08-18 NOTE — Progress Notes (Unsigned)
 fatig   Cardiology Office Note    Date:  08/22/2024   ID:  Ruthell, Feigenbaum [birth year], MRN [id number]  PCP:  Debrah Josette ORN., PA-C  Cardiologist:  Reis Goga [place], MD  Electrophysiologist:  Danelle Birmingham, MD   Chief Complaint: f/u SOB  History of Present Illness:   [name] is a 83 y.o. female with history of HTN, sinus node dysfunction s/p MDT PPM (gen change 2017), migraines, DM, fibromyalgia, HLD, CVA, TIAs, hiatal hernia, nonobstructive CAD, PAF, RLS, chronic edema who presents for follow-up.   She has prior history of evalulation for SOB and chest pain. Cardiac workup was previously unrevealing. Cath 04/2015 showed 10% prox RCA and 25% prox LAD with normal LVEF and normal LVEDP. She then saw Dr. Darlean in 2020 for evaluation of SOB and nocturnal wheezing which he felt were out of proportion to objective pulmonary findings, possibly related to deconditioning and potentially anxiety/depression. Labs were unrevealing including normal d-dimer and BNP.  Coronary CT 04/2019 which showed showed calcium  score of 34 (42nd percentile), mild nonobstructive CAD (0-25% RCA, 25-39% prox-mid LAD), small hiatal hernia. Device interrogation was unrevealing at that time.  Echo 05/08/19 showed EF 60-65%, normal diastolic function despite mild LA dilation, mildly increased LV wall thickness, normal RV, mild LAE/RAE. Her subsequent  device interrogation detected new onset atrial fibrillation so Dr. Birmingham stopped her Plavix  and started her on Eliquis . He also switched her HCTZ to Lasix  for possible component of diastolic congestion.  She did undergo a CPX in November 2021 and this was normal for age with hypertensive BP response and suggestion of diastolic dysfunction with exercise. BP control recommended. Last Echo in October 2024 showed normal EF with grade 2 diastolic dysfunction. No valve disease.   When I last saw her she was having increased Afib burden. She was admitted in August  and loaded with Tikosyn . Since then she has done well.   We also stopped amlodipine  due to swelling and started her on Farxiga . She notes LE edema resolved. BP has been running higher. Has peripheral neuropathy and balance issues. Planned to start Rehab for this.     Past Medical History:  Diagnosis Date   Allergy    Arthritis    CAD (coronary artery disease)    a. mild by prior cath; coronary CT 04/2019.   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   TIA (transient ischemic attack) 11/15/2015   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    Past Surgical History:  Procedure Laterality Date   ABDOMINAL HYSTERECTOMY     APPENDECTOMY     bil carple tunnel surgery     CARDIAC CATHETERIZATION N/A 04/09/2015   Procedure: Left Heart Cath and Coronary Angiography;  Surgeon: Zakkery Dorian M [place], MD;  Location: [facility] INVASIVE CV LAB;  Service: Cardiovascular;  Laterality: N/A;  COLONOSCOPY  multiple   EP IMPLANTABLE DEVICE N/A 04/09/2016   Procedure: PPM Generator Changeout;  Surgeon: Danelle LELON Birmingham, MD;  Location: [facility] INVASIVE CV LAB;  Service: Cardiovascular;  Laterality: N/A;   ESOPHAGEAL MANOMETRY N/A 08/02/2017   Procedure: ESOPHAGEAL MANOMETRY (EM);  Surgeon: Shila Gustav GAILS, MD;  Location: [facility] ENDOSCOPY;  Service: Endoscopy;  Laterality: N/A;   KNEE SURGERY     right x 2   NECK SURGERY     x2    PACEMAKER INSERTION     PH IMPEDANCE STUDY N/A 08/02/2017   Procedure: PH IMPEDANCE STUDY;  Surgeon: Shila Gustav GAILS, MD;  Location: [facility] ENDOSCOPY;  Service: Endoscopy;  Laterality: N/A;   UPPER GASTROINTESTINAL ENDOSCOPY      Current Medications: Current Meds  Medication Sig   atorvastatin  (LIPITOR) 80 MG tablet Take 1 tablet (80 mg total) by mouth every evening.   dapagliflozin  propanediol (FARXIGA ) 10 MG TABS tablet Take 1 tablet (10 mg total) by mouth daily before breakfast.   dofetilide  (TIKOSYN ) 250 MCG capsule Take 1 capsule (250 mcg total) by mouth 2 (two) times daily.   Evolocumab  (REPATHA  SURECLICK) 140 MG/ML SOAJ ADMINISTER 1 ML UNDER SKIN EVERY 14 DAYS   furosemide  (LASIX ) 20 MG tablet If blood pressure is below 110 do not take lasix . If blood pressure is 110-130 take 1 tablet (20 mg) by mouth of lasix . If blood pressure is above 130 take 2 tablets (40 mg) by mouth of lasix .   glipiZIDE  (GLUCOTROL ) 5 MG tablet Take 5 mg by mouth 2 (two) times daily before a meal.   lidocaine -prilocaine  (EMLA ) cream APPLY 1 APPLICATION TOPICALLY AS NEEDED   losartan  (COZAAR ) 100 MG tablet Take 1 tablet (100 mg total) by mouth daily.   pantoprazole  (PROTONIX ) 40 MG tablet Take 1 tablet (40 mg total) by mouth 2 (two) times daily before a meal.   potassium chloride  SA (KLOR-CON  M) 20 MEQ tablet TAKE 1 TABLET(20 MEQ) BY MOUTH DAILY   [DISCONTINUED] apixaban  (ELIQUIS ) 5 MG TABS tablet TAKE 1 TABLET(5 MG) BY MOUTH TWICE DAILY   [DISCONTINUED] losartan  (COZAAR ) 50 MG tablet Take 1 tablet (50 mg total) by mouth daily.      Allergies:   Citalopram, Cymbalta [duloxetine hcl], Metformin and related, Paroxetine hcl, Trazodone and nefazodone, Amlodipine , Caffeine, and Codeine   Social History   Socioeconomic History   Marital status: Married    Spouse name: james   Number of children: 2   Years of education: 12   Highest education level: Not on file  Occupational History   Occupation: Retired     Associate Professor: RETIRED  Tobacco Use   Smoking status: Never   Smokeless tobacco: Never   Tobacco comments:    Never smoked 06/13/24  Vaping Use   Vaping status: Never Used  Substance and Sexual Activity   Alcohol use: No    Alcohol/week: 0.0 standard drinks of alcohol   Drug use: No   Sexual activity: Not Currently  Other Topics Concern   Not on file  Social History Narrative   Patient is married with 2 children. She lives at home with her husband.    Patient is right handed.   Patient has 12 th grade education.   Patient drinks occasional caffeine.   Social Drivers of Health   Financial Resource Strain: Medium Risk (07/10/2022)   Received from [facility] visits prior to 01/02/2023., [facility]   Overall Financial Resource Strain (CARDIA)    Difficulty of  Paying Living Expenses: Somewhat hard  Food Insecurity: Low Risk  (07/26/2024)   Received from [facility]   Hunger Vital Sign    Within the past 12 months, you worried that your food would run out before you got money to buy more: Never true    Within the past 12 months, the food you bought just didn't last and you didn't have money to get more. : Never true  Transportation Needs: No Transportation Needs (07/26/2024)   Received from [facility]    In the past 12 months, has lack of reliable transportation kept you from medical appointments, meetings, work or from getting things needed for daily living? : No  Physical Activity: Insufficiently Active (07/10/2022)   Received from [facility], [facility] visits prior to 01/02/2023.   Exercise Vital Sign    On average, how many days per week do you engage in moderate to strenuous exercise (like a brisk walk)?: 4 days    On average, how many minutes do you engage in exercise at this level?: 30 min  Stress: No Stress Concern Present (07/10/2022)   Received from [facility], [facility] visits prior to  01/02/2023.   [employer] of Occupational Health - Occupational Stress Questionnaire    Feeling of Stress : Only a little  Social Connections: Socially Integrated (06/27/2024)   Social Connection and Isolation Panel    Frequency of Communication with Friends and Family: More than three times a week    Frequency of Social Gatherings with Friends and Family: More than three times a week    Attends Religious Services: More than 4 times per year    Active Member of [employer] or Organizations: Yes    Attends Engineer, structural: More than 4 times per year    Marital Status: Married     Family History:  The patient's family history includes Breast cancer in her maternal aunt; Colon cancer in her brother, father, and sister; Colon polyps in her sister; Coronary artery disease in her father; Crohn's disease in her sister; Dementia in her daughter and mother; Diabetes in her cousin, daughter, father, maternal aunt, and maternal uncle; Heart attack in her brother, daughter, and father; Irritable bowel syndrome in her sister; Kidney disease in her daughter; Lung cancer in her father and mother; Prostate cancer in her father. There is no history of Hypertension, Esophageal cancer, Rectal cancer, or Stomach cancer.  ROS:   Please see the history of present illness. Otherwise, review of systems is positive for nocturnal wheezing sometimes All other systems are reviewed and otherwise negative.    EKGs/Labs/Other Studies Reviewed:    Studies reviewed are outlined and summarized above. Reports included below if pertinent.  2D Echo 05/2019   1. The left ventricle has normal systolic function with an ejection  fraction of 60-65%. The cavity size was normal. There is mildly increased  left ventricular wall thickness. Mitral medial e' is 8 cm/s, suggesting  normal diastolic function despite mild  LA dilation.   2. The right ventricle has normal systolic function. The cavity was  normal. There is  no increase in right ventricular wall thickness. Right  ventricular systolic pressure is normal with an estimated pressure of 28.4  mmHg.   3. Left atrial size was mildly dilated.   4. Right atrial size was mildly dilated  Cor CT 04/2019 FINDINGS: A 120 kV prospective scan was triggered in the descending thoracic aorta at  111 HU's. Axial non-contrast 3 mm slices were carried out through the heart. The data set was analyzed on a dedicated work station and scored using the Agatson method. Gantry rotation speed was 250 msecs and collimation was .6 mm. No beta blockade and 0.8 mg of sl NTG was given. The 3D data set was reconstructed in 5% intervals of the 67-82 % of the R-R cycle. Diastolic phases were analyzed on a dedicated work station using MPR, MIP and VRT modes. The patient received 80 cc of contrast.   Aorta: Normal size. Minimal diffuse atherosclerotic plaque and calcifications. No dissection.   Aortic Valve:  Trileaflet.  No calcifications.   Coronary Arteries:  Normal coronary origin.  Right dominance.   RCA is a large dominant artery that gives rise to PDA and PLVB. There is minimal diffuse calcified plaque with stenosis 0-25%.   Left main is a short artery that gives rise to LAD and LCX arteries. Left main has no plaque.   LAD is a large vessel that gives rise to two small diagonal arteries. There is mild calcified plaque in the proximal and mid LAD with stenosis 25-49%.   LCX is a non-dominant artery that gives rise to one large OM1 branch. There is no plaque.   Other findings:   Normal pulmonary vein drainage into the left atrium.   A large left atrial appendage without an evidence for a thrombus.   Normal size of the pulmonary artery.   IMPRESSION: 1. Coronary calcium  score of 34. This was 40 percentile for age and sex matched control.   2. Normal coronary origin with right dominance.   3. Mild non-obstructive CAD, aggressive risk factor modification  is recommended.   4. Pacemaker leads are seen in the right atrium and right ventricle.     Electronically Signed   By: Leim Moose   On: 04/10/2019 12:45  IMPRESSION: Small hiatal hernia.   No acute extra cardiac abnormality.   Electronically Signed: By: Franky Crease M.D. On: 04/10/2019 12:33     CPX 09/12/20: Cardiopulmonary exercise test Order: [id number] Status: Final result   Visible to patient: Yes (seen)   Next appt: 02/06/2021 at 11:00 AM in Cardiology (Brigido Mera [place], MD)   Dx: Chronic shortness of breath   3 Result Notes      Narrative  Referred for: dyspnea   Procedure: This patient underwent staged symptom-limited exercise treadmill testing using a modified Naughton protocol with expired gas analysis metabolic evaluation during exercise.   Demographics   Age: 22 Ht. (in.) 66 Wt. (lb) 161.2 BMI: 26.0      Predicted Peak VO2: 17.1   Gender: Female Ht (cm) 167.6 Wt. (kg) 73.1    Results   Pre-Exercise PFTs   FVC 2.18 (77%)       FEV1 1.70 (79%)         FEV1/FVC 78 (100%)         MVV 73 (85%)        Exercise Time:    9:30   Speed (mph): 2.0       Grade (%): 10.5       RPE: 18   Reason stopped: dyspnea (8/10)   Additional symptoms: lightheaded (7/10)   Resting HR: 63 Standing HR: 63 Peak HR: 137   (96% age predicted max HR)   BP rest: 148/74 Standing BP: 130/68 BP peak: 204/58   Peak VO2: 16.4 (96% predicted peak VO2)   VE/VCO2 slope:  34   OUES: 1.35  Peak RER: 1.11   Ventilatory Threshold: 13.1 (77% predicted or measured peak VO2)   Peak RR 34   Peak Ventilation:  49.7   VE/MVV:  68%   PETCO2 at peak:  32   O2pulse:  8   (89% predicted O2pulse)    Interpretation   Notes: Patient gave a very good effort. Pulse-oximetry remained 96-99% for the duration of exercise.   ECG:  Resting ECG in normal sinus rhythm with atrial pacing. HR response appropriate. There were no sustained arrhythmias or ST-T changes. BP response  hypertensive at peak.   PFT:  Pre-exercise spirometry was within mostly normal limits. The MVV was normal.    CPX:  Exercise testing with gas exchange demonstrates a normal peak VO2 of 16.4 ml/kg/min (96% of the age/gender/weight matched sedentary norms). The RER of 1.11 indicates a maximal effort. When adjusted to the patient's ideal body weight of 145.9 lb (66.2 kg) the peak VO2 is 18.1 ml/kg (ibw)/min (102% of the ibw-adjusted predicted). The VE/VCO2 slope is mildly elevated and indicates mildly increased dead space ventilation. The oxygen uptake efficiency slope (OUES) is normal. The VO2 at the ventilatory threshold was normal at 77% of the predicted peak VO2. At peak exercise, the ventilation reached 68% of the measured MVV indicating ventilatory reserve remained. The O2pulse (a surrogate for stroke volume) increased initially, but remained flat for the majority of incremental intensity exercise, at a peak of 63ml/beat (89% predicted).    Conclusion: Exercise testing with gas exchange demonstrates normal functional capacity when compared to matched sedentary norms. There is no clear cardiopulmonary limitation. However, there was a hypertensive response with early flattening of O2 pulse.    Test, report and preliminary impression by:  Josette Ned, MS, ACSM-RCEP  09/12/2020 4:14 PM   Attending: Overall normal function capacity for age and gender. Resting spirometry reveals mild lung restriction and may be related to her body habitus. There is a normal HR response to exercise. There is a hypertensive response to exercise with mildly increased VE/VCO2 slope which likely suggests diastolic dysfunction and elevated filling pressures during exercise. Would recommend improved BP control, weight loss and routine exercise training.   Toribio Fuel, MD  3:26 PM        Echo 08/25/23: IMPRESSIONS     1. Left ventricular ejection fraction, by estimation, is 60 to 65%. Left  ventricular ejection  fraction by 3D volume is 60 %. The left ventricle has  normal function. The left ventricle has no regional wall motion  abnormalities. Left ventricular diastolic   parameters are consistent with Grade II diastolic dysfunction  (pseudonormalization). Elevated left ventricular end-diastolic pressure.  The E/e' is 15.   2. Right ventricular systolic function is normal. The right ventricular  size is normal. There is normal pulmonary artery systolic pressure. The  estimated right ventricular systolic pressure is 25.1 mmHg.   3. Left atrial size was mildly dilated.   4. The mitral valve is grossly normal. Mild mitral valve regurgitation.   5. The aortic valve is tricuspid. Aortic valve regurgitation is not  visualized. Aortic valve sclerosis is present, with no evidence of aortic  valve stenosis.   6. The inferior vena cava is normal in size with greater than 50%  respiratory variability, suggesting right atrial pressure of 3 mmHg.   Comparison(s): Changes from prior study are noted. 05/08/2019: LVEF 60-65%,  normal diastolic function.   EKG:  EKG is not ordered today  Recent Labs: 04/12/2024: TSH 3.660 06/22/2024: ALT 24 07/10/2024:  BUN 22; Creatinine, Ser 0.95; Magnesium  2.1; Potassium 3.9; Sodium 141  Recent Lipid Panel    Component Value Date/Time   CHOL 64 (L) 06/22/2024 0918   TRIG 59 06/22/2024 0918   HDL 47 06/22/2024 0918   CHOLHDL 1.4 06/22/2024 0918   CHOLHDL 3.2 05/11/2016 0638   VLDL 27 05/11/2016 0638   LDLCALC 3 06/22/2024 0918   Dated 01/15/21: A1c 6.5%. glucose 121.  CMET and CBC normal. B12 normal, TSH normal.    PHYSICAL EXAM:    VS:  BP (!) 152/76 (BP Location: Right Arm, Patient Position: Sitting, Cuff Size: Large)   Pulse 78   Resp 16   Ht 5' 6 (1.676 m)   Wt 162 lb 14.4 oz (73.9 kg)   SpO2 98%   BMI 26.29 kg/m   BMI: Body mass index is 26.29 kg/m.  GEN: Well nourished, well developed WF, in no acute distress HEENT: normocephalic, atraumatic Neck: no  JVD, carotid bruits, or masses Cardiac: RRR; no murmurs, rubs, or gallops, no  edema Respiratory:  clear to auscultation bilaterally, normal work of breathing GI: soft, nontender, nondistended, + BS MS: no deformity or atrophy Skin: warm and dry, no rash Neuro:  Alert and Oriented x 3, Strength and sensation are intact, follows commands Psych: euthymic mood, full affect  Wt Readings from Last 3 Encounters:  08/22/24 162 lb 14.4 oz (73.9 kg)  08/21/24 162 lb (73.5 kg)  08/07/24 162 lb (73.5 kg)     ASSESSMENT & PLAN:   Paroxysmal atrial fibrillation - She is on  Eliquis .  CHADS2VA2SC score is 8. Pacer adjusted by EP. Now on  AAD therapy with Tikosyn . Good response  Chronic diastolic dysfunction. Exacerbated by Afib. Swelling much better off amlodipine . continue Farxiga  10 mg daily. Continue current diuretic dose for now. Sodium restriction.   Mild CAD - no anginal symptoms.  Not on BB due to h/o sinus node dysfunction. Not on ASA due to concomitant Eliquis .  Essential HTN - elevated. Will increase losartan  to 100 mg daily.   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  DM on glipizide . Adding Farxiga  for CHF indication.   Disposition: F/u with me in 6 months.  Medication Adjustments/Labs and Tests Ordered: Current medicines are reviewed at length with the patient today.  Concerns regarding medicines are outlined above. Medication changes, Labs and Tests ordered today are summarized above and listed in the Patient Instructions accessible in Encounters.   Signed, Camarie Mctigue [place], MD  08/22/2024 10:09 AM

## 2024-08-21 ENCOUNTER — Ambulatory Visit (INDEPENDENT_AMBULATORY_CARE_PROVIDER_SITE_OTHER): Admitting: Podiatry

## 2024-08-21 VITALS — Ht 66.0 in | Wt 162.0 lb

## 2024-08-21 DIAGNOSIS — E1142 Type 2 diabetes mellitus with diabetic polyneuropathy: Secondary | ICD-10-CM | POA: Diagnosis not present

## 2024-08-21 DIAGNOSIS — R269 Unspecified abnormalities of gait and mobility: Secondary | ICD-10-CM

## 2024-08-21 DIAGNOSIS — R2689 Other abnormalities of gait and mobility: Secondary | ICD-10-CM

## 2024-08-21 NOTE — Progress Notes (Signed)
  Subjective:  Patient ID: Maddux First, female    DOB: 1941/05/08,  MRN: [id number]  Chief Complaint  Patient presents with   Diabetes    Rm 10 DFC    83 y.o. female presents with the above complaint. History confirmed with patient.  She returns for follow-up today for annual diabetic foot exam, would like to have new shoes again as well.  She is noting balance issues now with her neuropathy.  Objective:  Physical Exam: warm, good capillary refill, no trophic changes or ulcerative lesions, normal DP and PT pulses, abnormal sensory exam, she is grossly neuropathic, and flexible pes cavus foot type, there is tenderness and prominence of the second metatarsal head plantar bilateral.    Radiographs: Multiple views x-ray of both feet: no fracture, dislocation, swelling or degenerative changes noted and pes cavus foot type, no evidence of Charcot neuroarthropathy Assessment:   1. Diabetic peripheral neuropathy (HCC)   2. Gait disturbance   3. Balance disorder      Plan:  Patient was evaluated and treated and all questions answered.  Annual diabetic foot risk examination was performed she remains high risk due to her neuropathy and deformity.  I recommend she continue to utilize diabetic prescription shoes.  Will see if we can send out referral for this.  I recommend physical therapy for balance and gait training from her neuropathy.  Previously had neurorehab due to previous stroke and TIA.  Referral sent for this as well.   No follow-ups on file.

## 2024-08-22 ENCOUNTER — Ambulatory Visit: Attending: Cardiology | Admitting: Cardiology

## 2024-08-22 ENCOUNTER — Encounter: Payer: Self-pay | Admitting: Cardiology

## 2024-08-22 VITALS — BP 152/76 | HR 78 | Resp 16 | Ht 66.0 in | Wt 162.9 lb

## 2024-08-22 DIAGNOSIS — R600 Localized edema: Secondary | ICD-10-CM

## 2024-08-22 DIAGNOSIS — I10 Essential (primary) hypertension: Secondary | ICD-10-CM | POA: Diagnosis not present

## 2024-08-22 DIAGNOSIS — I495 Sick sinus syndrome: Secondary | ICD-10-CM

## 2024-08-22 DIAGNOSIS — I2510 Atherosclerotic heart disease of native coronary artery without angina pectoris: Secondary | ICD-10-CM

## 2024-08-22 DIAGNOSIS — I480 Paroxysmal atrial fibrillation: Secondary | ICD-10-CM

## 2024-08-22 MED ORDER — LOSARTAN POTASSIUM 100 MG PO TABS: 100.0000 mg | ORAL_TABLET | Freq: Every day | ORAL | 3 refills | Status: AC

## 2024-08-22 MED ORDER — APIXABAN 5 MG PO TABS: ORAL_TABLET | ORAL | 3 refills | Status: AC

## 2024-08-22 NOTE — Patient Instructions (Signed)
 Medication Instructions:  Increase Losartan  to 100 mg daily Continue all other medications  *If you need a refill on your cardiac medications before your next appointment, please call your pharmacy*  Lab Work: None ordered  Testing/Procedures: None ordered  Follow-Up: At [facility], you and your health needs are our priority.  As part of our continuing mission to provide you with exceptional heart care, our providers are all part of one team.  This team includes your primary Cardiologist (physician) and Advanced Practice Providers or APPs (Physician Assistants and Nurse Practitioners) who all work together to provide you with the care you need, when you need it.  Your next appointment:  6 months   Call in Jan to schedule April appointment    Provider:  Dr.Jordan   We recommend signing up for the patient portal called MyChart.  Sign up information is provided on this After Visit Summary.  MyChart is used to connect with patients for Virtual Visits (Telemedicine).  Patients are able to view lab/test results, encounter notes, upcoming appointments, etc.  Non-urgent messages can be sent to your provider as well.   To learn more about what you can do with MyChart, go to [email or web address].

## 2024-09-11 ENCOUNTER — Other Ambulatory Visit: Payer: Self-pay

## 2024-09-11 ENCOUNTER — Ambulatory Visit: Attending: Podiatry

## 2024-09-11 DIAGNOSIS — R2681 Unsteadiness on feet: Secondary | ICD-10-CM | POA: Diagnosis present

## 2024-09-11 DIAGNOSIS — R269 Unspecified abnormalities of gait and mobility: Secondary | ICD-10-CM | POA: Diagnosis not present

## 2024-09-11 DIAGNOSIS — M6281 Muscle weakness (generalized): Secondary | ICD-10-CM | POA: Diagnosis present

## 2024-09-11 DIAGNOSIS — E1142 Type 2 diabetes mellitus with diabetic polyneuropathy: Secondary | ICD-10-CM | POA: Diagnosis not present

## 2024-09-11 NOTE — Therapy (Signed)
 OUTPATIENT PHYSICAL THERAPY NEURO EVALUATION   Patient Name: [name] MRN: [id number] DOB:[birth year], 83 y.o., female Today's Date: 09/11/2024   PCP: Debrah Josette ORN, PA REFERRING PROVIDER: Silva Juliene SAUNDERS, DPM  END OF SESSION:  PT End of Session - 09/11/24 0953     Visit Number 1    Number of Visits 6    Date for Recertification  11/06/24    Authorization Type [employer]    PT Start Time 1015    PT Stop Time 1100    PT Time Calculation (min) 45 min          Past Medical History:  Diagnosis Date   Allergy    Arthritis    CAD (coronary artery disease)    a. mild by prior cath; coronary CT 04/2019.   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   TIA (transient ischemic attack) 11/15/2015   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   Past Surgical History:  Procedure Laterality Date   ABDOMINAL HYSTERECTOMY     APPENDECTOMY     bil carple tunnel surgery     CARDIAC CATHETERIZATION N/A 04/09/2015   Procedure: Left Heart Cath and Coronary Angiography;  Surgeon: Peter M Jordan, MD;  Location: [facility] INVASIVE CV LAB;  Service: Cardiovascular;  Laterality: N/A;   COLONOSCOPY  multiple   EP IMPLANTABLE DEVICE N/A 04/09/2016   Procedure: PPM Generator Changeout;  Surgeon: Danelle ORN Birmingham, MD;  Location: [facility] INVASIVE CV LAB;  Service: Cardiovascular;  Laterality:  N/A;   ESOPHAGEAL MANOMETRY N/A 08/02/2017   Procedure: ESOPHAGEAL MANOMETRY (EM);  Surgeon: Shila Gustav GAILS, MD;  Location: [facility] ENDOSCOPY;  Service: Endoscopy;  Laterality: N/A;   KNEE SURGERY     right x 2   NECK SURGERY     x2   PACEMAKER INSERTION     PH IMPEDANCE STUDY N/A 08/02/2017   Procedure: PH IMPEDANCE STUDY;  Surgeon: Shila Gustav GAILS, MD;  Location: [facility] ENDOSCOPY;  Service: Endoscopy;  Laterality: N/A;   UPPER GASTROINTESTINAL ENDOSCOPY     Patient Active Problem List   Diagnosis Date Noted   Paroxysmal atrial fibrillation (HCC) 06/27/2024   Pacemaker 07/22/2022   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) 01/14/2022   PAF (paroxysmal atrial fibrillation) (HCC) 01/15/2021   Sigmoid diverticulosis 06/21/2018   Dyslipidemia 05/10/2016   Elevated AST (SGOT) 05/10/2016   Dizziness 05/10/2016   Headache 05/10/2016   Cephalalgia    Complicated migraine    Migraine with visual aura    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/09/2016   Insomnia 12/10/2015   Type 2 diabetes mellitus without complication, without long-term current use of insulin  (HCC) 12/10/2015   TIA (transient ischemic attack) 11/15/2015   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   Depression 07/23/2015  Anxiety state 07/23/2015   Abnormal nuclear stress test 04/09/2015   DOE (dyspnea on exertion) 03/14/2015   Chest pain 03/14/2015   Edema leg 03/14/2015   Essential hypertension 08/11/2014   History of Serotonin syndrome w/ recurrent residual headaches 08/10/2014   Balance disorder 08/10/2014   B12 nutritional deficiency 08/10/2014   Edema 06/09/2013   Generalized anxiety disorder 05/09/2013   Mixed hyperlipidemia 04/11/2013   Spinal stenosis, unspecified region other than cervical 01/03/2013   Generalized muscle ache 11/29/2012   Low back pain 02/26/2012   Unspecified urinary incontinence 02/26/2012   Gastroesophageal  reflux disease 12/14/2011   BRADYCARDIA 11/18/2010   CARDIAC PACEMAKER IN SITU 11/18/2010   CAD (coronary artery disease), native coronary artery 07/13/2007    ONSET DATE: a while  REFERRING DIAG: E11.42 (ICD-10-CM) - Diabetic peripheral neuropathy (HCC) R26.9 (ICD-10-CM) - Gait disturbance  THERAPY DIAG:  Unsteadiness on feet  Muscle weakness (generalized)  Rationale for Evaluation and Treatment: Rehabilitation  SUBJECTIVE:                                                                                                                                                                                             SUBJECTIVE STATEMENT: Neuropathy in the BLE is quite bad and having difficulty with balance.  Reports ongoing painful experience with the neuropathy and she is unable to take medication to manage and notes symptoms appear from distal feet to mid thigh. Reports she has attempted use of creams but not very effective and expensive.  Reports using LE pedlar for exercise and walking in the house for activity. Increased difficulty with balance and walking outdoors.  Pt accompanied by: self  PERTINENT HISTORY: BLE neuropathy  PAIN:  Are you having pain? 6/10 general BLE discomfort  PRECAUTIONS: Fall  RED FLAGS: None   WEIGHT BEARING RESTRICTIONS: No  FALLS: Has patient fallen in last 6 months? Yes. Number of falls 1, fell taking trashcans out, unable to get back up, no injury  LIVING ENVIRONMENT: Lives with: lives with their spouse Lives in: House/apartment Stairs: none in the house, 2 steps to enter w/ HR Has following equipment at home: Single point cane, shower chair, Grab bars, and    PLOF: Independent  PATIENT GOALS: maintain independence, improve balance, ideally not use an AD (but will if I have too)  OBJECTIVE:  Note: Objective measures were completed at Evaluation unless otherwise noted.  DIAGNOSTIC FINDINGS: Radiographs: Multiple views x-ray of both feet:  no fracture, dislocation, swelling or degenerative changes noted and pes cavus foot type, no evidence of Charcot neuroarthropathyPhysical Exam: warm, good capillary refill, no trophic changes or ulcerative  lesions, normal DP and PT pulses, abnormal sensory exam, she is grossly neuropathic, and flexible pes cavus foot type, there is tenderness and prominence of the second metatarsal head plantar bilateral.  COGNITION: Overall cognitive status: Within functional limits for tasks assessed   SENSATION: Reports numbness/tingling   COORDINATION:   EDEMA:  none      POSTURE: No Significant postural limitations  LOWER EXTREMITY ROM:     Active  Right Eval Left Eval  Hip flexion    Hip extension    Hip abduction    Hip adduction    Hip internal rotation    Hip external rotation    Knee flexion    Knee extension    Ankle dorsiflexion 10 10  Ankle plantarflexion    Ankle inversion    Ankle eversion     (Blank rows = not tested)  LOWER EXTREMITY MMT:    3+/5 gross BLE to seated resisted tests 10 reps for standing calf raise w/ compensations  BED MOBILITY:  Not tested  TRANSFERS: Modified independent Floor to stand: reports unable to perform    CURB:  Not tested  STAIRS: Not tested GAIT: Findings: Comments: decreased stride  FUNCTIONAL TESTS:  5 times sit to stand: 27 sec w/ BUE support  M-CTSIB  Condition 1: Firm Surface, EO 30 Sec, Normal Sway  Condition 2: Firm Surface, EC 30 Sec, Mild Sway  Condition 3: Foam Surface, EO 0 Sec, unable Sway  Condition 4: Foam Surface, EC 0 Sec, unable Sway                                                                                                                                   TREATMENT DATE: 09/11/24    PATIENT EDUCATION: Education details: assessment details, rationale of PT intervention, sensory infor (vibration platform), HEP initiation Person educated: Patient Education method: Explanation and  Handouts Education comprehension: needs further education  HOME EXERCISE PROGRAM: Access Code: [email or web address] URL: [email or web address] Date: 09/11/2024 Prepared by: Kelly Zion Lint  Exercises - Heel Toe Raises with Counter Support  - 2-3 x weekly - 3 sets - 10 reps - Standing Gastroc Stretch at Counter  - 1 x daily - 2-3 x weekly - 3 reps - 30-60 sec hold  GOALS: Goals reviewed with patient? Yes  SHORT TERM GOALS: Target date: 10/02/2024    Patient will be independent in HEP to improve functional outcomes Baseline: Goal status: INITIAL  2.  Demo improved postural control/static balance per mild sway x 30 sec condition 3 M-CTSIB Baseline: unable Goal status: INITIAL    LONG TERM GOALS: Target date: 11/06/2024    Demo improved BLE strength and reduced risk for falls per time 15 sec 5xSTS Baseline: 27 sec w/ BUE Goal status: INITIAL  2.  Berg Balance Test score 50/56 for low risk for falls during ADL Baseline: TBD Goal status: INITIAL  3.  Manifest improved postural control/proprioceptive awareness per mild-moderate sway  condition 4 M-CTSIB to improve safety w/ ADL Baseline: unable Goal status: INITIAL  4.  Floor to stand w/ modified independence to improve functional mobility Baseline: reports dependence on assist Goal status: INITIAL    ASSESSMENT:  CLINICAL IMPRESSION: Patient is a 83 y.o. lady who was seen today for physical therapy evaluation and treatment for E11.42 (ICD-10-CM) - Diabetic peripheral neuropathy (HCC) R26.9 (ICD-10-CM) - Gait disturbance.  Exhibits generalized BLE weakness with inability to complete 5xSTS test requiring BUE support. Limited ankle ROM and strength with difficulty completing M-CTSIB on compliant surface.  Pt reports increased difficulty with ambulation on outdoor surfaces and when inside home relying on furniture for support.  Pt would benefit from PT sessions to address deficits and limitations to improve mobility and reduce  risk for falls.   OBJECTIVE IMPAIRMENTS: Abnormal gait, decreased activity tolerance, decreased endurance, decreased knowledge of use of DME, decreased ROM, decreased strength, impaired flexibility, impaired sensation, and pain.   ACTIVITY LIMITATIONS: carrying, lifting, bending, stairs, transfers, reach over head, and locomotion level  PARTICIPATION LIMITATIONS: meal prep, cleaning, community activity, and yard work  PERSONAL FACTORS: Age, Time since onset of injury/illness/exacerbation, and 1-2 comorbidities: PMH are also affecting patient's functional outcome.   REHAB POTENTIAL: Good  CLINICAL DECISION MAKING: Evolving/moderate complexity  EVALUATION COMPLEXITY: Moderate  PLAN:  PT FREQUENCY: 1x/week, anticipate 4-6 visits  PT DURATION: 8 weeks  PLANNED INTERVENTIONS: 97750- Physical Performance Testing, 97110-Therapeutic exercises, 97530- Therapeutic activity, [id number]- Neuromuscular re-education, 97535- Self Care, [id number]- Manual therapy, [id number]- Gait training, [id number]- Aquatic Therapy, [id number] (1-2 muscles), 20561 (3+ muscles)- Dry Needling, and Patient/Family education  PLAN FOR NEXT SESSION: Berg Balance and goal, HEP review and progressions for strength/balance   12:55 PM, 09/11/24 M. Kelly Man Effertz, PT, DPT Physical Therapist- [place] Office Number: [phone]

## 2024-09-13 ENCOUNTER — Encounter: Payer: Self-pay | Admitting: Physician Assistant

## 2024-09-13 ENCOUNTER — Ambulatory Visit: Admitting: Physician Assistant

## 2024-09-13 VITALS — BP 102/64

## 2024-09-13 DIAGNOSIS — D1801 Hemangioma of skin and subcutaneous tissue: Secondary | ICD-10-CM

## 2024-09-13 DIAGNOSIS — Z85820 Personal history of malignant melanoma of skin: Secondary | ICD-10-CM

## 2024-09-13 DIAGNOSIS — L814 Other melanin hyperpigmentation: Secondary | ICD-10-CM

## 2024-09-13 DIAGNOSIS — Z1283 Encounter for screening for malignant neoplasm of skin: Secondary | ICD-10-CM | POA: Diagnosis not present

## 2024-09-13 DIAGNOSIS — L578 Other skin changes due to chronic exposure to nonionizing radiation: Secondary | ICD-10-CM | POA: Diagnosis not present

## 2024-09-13 DIAGNOSIS — D229 Melanocytic nevi, unspecified: Secondary | ICD-10-CM

## 2024-09-13 DIAGNOSIS — W908XXA Exposure to other nonionizing radiation, initial encounter: Secondary | ICD-10-CM | POA: Diagnosis not present

## 2024-09-13 DIAGNOSIS — L821 Other seborrheic keratosis: Secondary | ICD-10-CM

## 2024-09-13 NOTE — Patient Instructions (Signed)

## 2024-09-13 NOTE — Progress Notes (Signed)
   Follow-Up Visit   Subjective  [name] is a 83 y.o. female ESTABLISHED PATIENT who presents for the following: Skin Cancer Screening and Full Body Skin Exam - History of Melanoma 05/2024  The patient presents for Total-Body Skin Exam (TBSE) for skin cancer screening and mole check. The patient has spots, moles and lesions to be evaluated, some may be new or changing and the patient may have concern these could be cancer.    The following portions of the chart were reviewed this encounter and updated as appropriate: medications, allergies, medical history  Review of Systems:  No other skin or systemic complaints except as noted in HPI or Assessment and Plan.  Objective  Well appearing patient in no apparent distress; mood and affect are within normal limits.  A full examination was performed including scalp, head, eyes, ears, nose, lips, neck, chest, axillae, abdomen, back, buttocks, bilateral upper extremities, bilateral lower extremities, hands, feet, fingers, toes, fingernails, and toenails. All findings within normal limits unless otherwise noted below.   Relevant physical exam findings are noted in the Assessment and Plan.  No palpable cervical, axillary or inguinal lymphadenopathy.     Assessment & Plan   SKIN CANCER SCREENING PERFORMED TODAY.  ACTINIC DAMAGE - Chronic condition, secondary to cumulative UV/sun exposure - diffuse scaly erythematous macules with underlying dyspigmentation - Recommend daily broad spectrum sunscreen SPF 30+ to sun-exposed areas, reapply every 2 hours as needed.  - Staying in the shade or wearing long sleeves, sun glasses (UVA+UVB protection) and wide brim hats (4-inch brim around the entire circumference of the hat) are also recommended for sun protection.  - Call for new or changing lesions.  LENTIGINES, SEBORRHEIC KERATOSES, HEMANGIOMAS - Benign normal skin lesions - Benign-appearing - Call for any changes  MELANOCYTIC  NEVI - Tan-brown and/or pink-flesh-colored symmetric macules and papules - Benign appearing on exam today - Observation - Call clinic for new or changing moles - Recommend daily use of broad spectrum spf 30+ sunscreen to sun-exposed areas.   HISTORY OF MELANOMA - No evidence of recurrence today - No lymphadenopathy - Recommend regular full body skin exams - Recommend daily broad spectrum sunscreen SPF 30+ to sun-exposed areas, reapply every 2 hours as needed.  - Call if any new or changing lesions are noted between office visits     PERSONAL HISTORY OF MALIGNANT MELANOMA OF SKIN   SCREENING EXAM FOR SKIN CANCER   ACTINIC SKIN DAMAGE   LENTIGINES   SEBORRHEIC KERATOSIS   CHERRY ANGIOMA   MULTIPLE BENIGN NEVI   Return in about 3 months (around 12/14/2024) for TBSE.  I, Roseline Hutchinson, CMA, am acting as scribe for Erica Richwine K, PA-C .   Documentation: I have reviewed the above documentation for accuracy and completeness, and I agree with the above.  Danesha Kirchoff K, PA-C

## 2024-09-19 NOTE — Therapy (Signed)
 OUTPATIENT PHYSICAL THERAPY NEURO TREATMENT   Patient Name: [name] MRN: [id number] DOB:May 06, 1941, 83 y.o., female Today's Date: 09/20/2024   PCP: Debrah Josette ORN, PA REFERRING PROVIDER: Silva Juliene SAUNDERS, DPM  END OF SESSION:  PT End of Session - 09/20/24 0924     Visit Number 2    Number of Visits 6    Date for Recertification  11/06/24    Authorization Type [employer]    PT Start Time 0850    PT Stop Time 0929    PT Time Calculation (min) 39 min    Equipment Utilized During Treatment Gait belt    Activity Tolerance Patient tolerated treatment well    Behavior During Therapy [facility] for tasks assessed/performed           Past Medical History:  Diagnosis Date   Allergy    Arthritis    CAD (coronary artery disease)    a. mild by prior cath; coronary CT 04/2019.   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   TIA (transient ischemic attack) 11/15/2015   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   Past Surgical History:  Procedure Laterality Date   ABDOMINAL HYSTERECTOMY     APPENDECTOMY     bil carple tunnel surgery     CARDIAC CATHETERIZATION N/A 04/09/2015   Procedure: Left Heart Cath and Coronary Angiography;  Surgeon: Peter M Jordan, MD;  Location: [facility] INVASIVE CV LAB;  Service: Cardiovascular;  Laterality: N/A;   COLONOSCOPY  multiple   EP  IMPLANTABLE DEVICE N/A 04/09/2016   Procedure: PPM Generator Changeout;  Surgeon: Danelle ORN Birmingham, MD;  Location: [facility] INVASIVE CV LAB;  Service: Cardiovascular;  Laterality: N/A;   ESOPHAGEAL MANOMETRY N/A 08/02/2017   Procedure: ESOPHAGEAL MANOMETRY (EM);  Surgeon: Shila Gustav GAILS, MD;  Location: [facility] ENDOSCOPY;  Service: Endoscopy;  Laterality: N/A;   KNEE SURGERY     right x 2   NECK SURGERY     x2   PACEMAKER INSERTION     PH IMPEDANCE STUDY N/A 08/02/2017   Procedure: PH IMPEDANCE STUDY;  Surgeon: Shila Gustav GAILS, MD;  Location: [facility] ENDOSCOPY;  Service: Endoscopy;  Laterality: N/A;   UPPER GASTROINTESTINAL ENDOSCOPY     Patient Active Problem List   Diagnosis Date Noted   Paroxysmal atrial fibrillation (HCC) 06/27/2024   Pacemaker 07/22/2022   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) 01/14/2022   PAF (paroxysmal atrial fibrillation) (HCC) 01/15/2021   Sigmoid diverticulosis 06/21/2018   Dyslipidemia 05/10/2016   Elevated AST (SGOT) 05/10/2016   Dizziness 05/10/2016   Headache 05/10/2016   Cephalalgia    Complicated migraine    Migraine with visual aura    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/09/2016   Insomnia 12/10/2015   Type 2 diabetes mellitus without complication, without long-term current  use of insulin  (HCC) 12/10/2015   TIA (transient ischemic attack) 11/15/2015   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   Depression 07/23/2015   Anxiety state 07/23/2015   Abnormal nuclear stress test 04/09/2015   DOE (dyspnea on exertion) 03/14/2015   Chest pain 03/14/2015   Edema leg 03/14/2015   Essential hypertension 08/11/2014   History of Serotonin syndrome w/ recurrent residual headaches 08/10/2014   Balance disorder 08/10/2014   B12 nutritional deficiency 08/10/2014   Edema 06/09/2013   Generalized anxiety disorder 05/09/2013   Mixed hyperlipidemia 04/11/2013   Spinal stenosis, unspecified  region other than cervical 01/03/2013   Generalized muscle ache 11/29/2012   Low back pain 02/26/2012   Unspecified urinary incontinence 02/26/2012   Gastroesophageal reflux disease 12/14/2011   BRADYCARDIA 11/18/2010   CARDIAC PACEMAKER IN SITU 11/18/2010   CAD (coronary artery disease), native coronary artery 07/13/2007    ONSET DATE: a while  REFERRING DIAG: E11.42 (ICD-10-CM) - Diabetic peripheral neuropathy (HCC) R26.9 (ICD-10-CM) - Gait disturbance  THERAPY DIAG:  Unsteadiness on feet  Muscle weakness (generalized)  Rationale for Evaluation and Treatment: Rehabilitation  SUBJECTIVE:                                                                                                                                                                                             SUBJECTIVE STATEMENT: Tried to cancel this appointment as she needs to go out of town d/t family member getting into car accident.    Pt accompanied by: self  PERTINENT HISTORY: BLE neuropathy  PAIN:  Are you having pain? No  PRECAUTIONS: Fall  RED FLAGS: None   WEIGHT BEARING RESTRICTIONS: No  FALLS: Has patient fallen in last 6 months? Yes. Number of falls 1, fell taking trashcans out, unable to get back up, no injury  LIVING ENVIRONMENT: Lives with: lives with their spouse Lives in: House/apartment Stairs: none in the house, 2 steps to enter w/ HR Has following equipment at home: Single point cane, shower chair, Grab bars, and    PLOF: Independent  PATIENT GOALS: maintain independence, improve balance, ideally not use an AD (but will if I have too)  OBJECTIVE:      TODAY'S TREATMENT: 09/19/24 Activity Comments  Lars 36/56  review of HEP:  heel toe raise gastroc stretch Required demo and cueing   romberg EC 2x30 romberg foam 2x30 alt fwd step  alt backwards step Pt requires cues to reach for counter to prevent fall with romberg exercises. Balance improved with practice.  Limited wt shift and focus on heel-toe pattern with stepping and pt was able to  wean UE support               [facility] PT Assessment - 09/20/24 0001       Standardized Balance Assessment   Standardized Balance Assessment Berg Balance Test      Berg Balance Test   Sit to Stand Able to stand without using hands and stabilize independently    Standing Unsupported Able to stand safely 2 minutes    Sitting with Back Unsupported but Feet Supported on Floor or Stool Able to sit safely and securely 2 minutes    Stand to Sit Uses backs of legs against chair to control descent    Transfers Able to transfer safely, minor use of hands    Standing Unsupported with Eyes Closed Able to stand 10 seconds safely    Standing Unsupported with Feet Together Able to place feet together independently and stand for 1 minute with supervision    From Standing, Reach Forward with Outstretched Arm Can reach forward >12 cm safely (5)    From Standing Position, Pick up Object from Floor Able to pick up shoe, needs supervision    From Standing Position, Turn to Look Behind Over each Shoulder Turn sideways only but maintains balance    Turn 360 Degrees Able to turn 360 degrees safely but slowly    Standing Unsupported, Alternately Place Feet on Step/Stool Needs assistance to keep from falling or unable to try    Standing Unsupported, One Foot in [employer] balance while stepping or standing    Standing on One Leg Tries to lift leg/unable to hold 3 seconds but remains standing independently    Total Score 36          HOME EXERCISE PROGRAM Last updated: 09/20/24 Access Code: [email or web address] URL: [email or web address] Date: 09/20/2024 Prepared by: [facility] - Outpatient  Rehab - [facility]  Program Notes Perform at kitchen counter top for safety  Exercises - Heel Toe Raises with Counter Support  - 2-3 x weekly - 3 sets - 10 reps - Standing Gastroc Stretch at Counter  - 1 x daily - 2-3 x weekly -  3 reps - 30-60 sec hold - Alternating Step Forward with Support  - 1 x daily - 5 x weekly - 2 sets - 10 reps - Alternating Step Backward with Support  - 1 x daily - 5 x weekly - 2 sets - 10 reps   PATIENT EDUCATION: Education details: HEP update  Person educated: Patient Education method: Explanation, Demonstration, Tactile cues, Verbal cues, and Handouts Education comprehension: verbalized understanding and returned demonstration     Note: Objective measures were completed at Evaluation unless otherwise noted.  DIAGNOSTIC FINDINGS: Radiographs: Multiple views x-ray of both feet: no fracture, dislocation, swelling or degenerative changes noted and pes cavus foot type, no evidence of Charcot neuroarthropathyPhysical Exam: warm, good capillary refill, no trophic changes or ulcerative lesions, normal DP and PT pulses, abnormal sensory exam, she is grossly neuropathic, and flexible pes cavus foot type, there is tenderness and prominence of the second metatarsal head plantar bilateral.  COGNITION: Overall cognitive status: Within functional limits for tasks assessed   SENSATION: Reports numbness/tingling   COORDINATION:   EDEMA:  none      POSTURE: No Significant postural limitations  LOWER EXTREMITY ROM:     Active  Right Eval Left Eval  Hip flexion    Hip extension    Hip abduction    Hip adduction    Hip internal rotation  Hip external rotation    Knee flexion    Knee extension    Ankle dorsiflexion 10 10  Ankle plantarflexion    Ankle inversion    Ankle eversion     (Blank rows = not tested)  LOWER EXTREMITY MMT:    3+/5 gross BLE to seated resisted tests 10 reps for standing calf raise w/ compensations  BED MOBILITY:  Not tested  TRANSFERS: Modified independent Floor to stand: reports unable to perform    CURB:  Not tested  STAIRS: Not tested GAIT: Findings: Comments: decreased stride  FUNCTIONAL TESTS:  5 times sit to stand: 27 sec w/  BUE support  M-CTSIB  Condition 1: Firm Surface, EO 30 Sec, Normal Sway  Condition 2: Firm Surface, EC 30 Sec, Mild Sway  Condition 3: Foam Surface, EO 0 Sec, unable Sway  Condition 4: Foam Surface, EC 0 Sec, unable Sway                                                                                                                                   TREATMENT DATE: 09/11/24    PATIENT EDUCATION: Education details: assessment details, rationale of PT intervention, sensory infor (vibration platform), HEP initiation Person educated: Patient Education method: Explanation and Handouts Education comprehension: needs further education  HOME EXERCISE PROGRAM: Access Code: [email or web address] URL: [email or web address] Date: 09/11/2024 Prepared by: Kelly Halpin  Exercises - Heel Toe Raises with Counter Support  - 2-3 x weekly - 3 sets - 10 reps - Standing Gastroc Stretch at Counter  - 1 x daily - 2-3 x weekly - 3 reps - 30-60 sec hold  GOALS: Goals reviewed with patient? Yes  SHORT TERM GOALS: Target date: 10/02/2024    Patient will be independent in HEP to improve functional outcomes Baseline: Goal status: IN PROGRESS  2.  Demo improved postural control/static balance per mild sway x 30 sec condition 3 M-CTSIB Baseline: unable Goal status: IN PROGRESS    LONG TERM GOALS: Target date: 11/06/2024    Demo improved BLE strength and reduced risk for falls per time 15 sec 5xSTS Baseline: 27 sec w/ BUE Goal status: IN PROGRESS  2.  Berg Balance Test score 50/56 for low risk for falls during ADL Baseline: 36/56 09/20/24 Goal status:IN PROGRESS 09/20/24  3.  Manifest improved postural control/proprioceptive awareness per mild-moderate sway condition 4 M-CTSIB to improve safety w/ ADL Baseline: unable Goal status: IN PROGRESS  4.  Floor to stand w/ modified independence to improve functional mobility Baseline: reports dependence on assist Goal status: IN  PROGRESS    ASSESSMENT:  CLINICAL IMPRESSION: Patient arrived to session without new complaints. Session focused on balance assessment, revealing Berg score of 36/56 which indicates an increased risk of falls. Reviewed HEP for max carryover and worked on progressing static and dynamic balance tasks. Initially with foot-flat stepping pattern but able to demo good heel-toe with cueing. Patient tolerated session well and without  complaints at end of appointment.  OBJECTIVE IMPAIRMENTS: Abnormal gait, decreased activity tolerance, decreased endurance, decreased knowledge of use of DME, decreased ROM, decreased strength, impaired flexibility, impaired sensation, and pain.   ACTIVITY LIMITATIONS: carrying, lifting, bending, stairs, transfers, reach over head, and locomotion level  PARTICIPATION LIMITATIONS: meal prep, cleaning, community activity, and yard work  PERSONAL FACTORS: Age, Time since onset of injury/illness/exacerbation, and 1-2 comorbidities: PMH are also affecting patient's functional outcome.   REHAB POTENTIAL: Good  CLINICAL DECISION MAKING: Evolving/moderate complexity  EVALUATION COMPLEXITY: Moderate  PLAN:  PT FREQUENCY: 1x/week, anticipate 4-6 visits  PT DURATION: 8 weeks  PLANNED INTERVENTIONS: 97750- Physical Performance Testing, 97110-Therapeutic exercises, 97530- Therapeutic activity, 97112- Neuromuscular re-education, 97535- Self Care, [id number]- Manual therapy, [phone]- Gait training, [phone]- Aquatic Therapy, [phone] (1-2 muscles), [phone] (3+ muscles)- Dry Needling, and Patient/Family education  PLAN FOR NEXT SESSION: HEP review and progressions for strength/balance; heel-toe pattern    ,Louana Terrilyn Christians, PT, DPT 09/20/24 9:30 AM  [facility] at [facility] [address], [address] [place], [place] [address] Phone # [phone] Fax # [phone]

## 2024-09-20 ENCOUNTER — Encounter: Payer: Self-pay | Admitting: Physical Therapy

## 2024-09-20 ENCOUNTER — Ambulatory Visit: Admitting: Physical Therapy

## 2024-09-20 DIAGNOSIS — M6281 Muscle weakness (generalized): Secondary | ICD-10-CM

## 2024-09-20 DIAGNOSIS — R2681 Unsteadiness on feet: Secondary | ICD-10-CM | POA: Diagnosis not present

## 2024-09-25 ENCOUNTER — Ambulatory Visit: Admitting: Physical Therapy

## 2024-10-02 ENCOUNTER — Ambulatory Visit: Admitting: Physical Therapy

## 2024-10-02 NOTE — Therapy (Incomplete)
 OUTPATIENT PHYSICAL THERAPY NEURO TREATMENT   Patient Name: [name] MRN: [id number] DOB:1941-04-23, 83 y.o., female Today's Date: 10/02/2024   PCP: Debrah Josette ORN, PA REFERRING PROVIDER: Silva Juliene SAUNDERS, DPM  END OF SESSION:     Past Medical History:  Diagnosis Date   Allergy    Arthritis    CAD (coronary artery disease)    a. mild by prior cath; coronary CT 04/2019.   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   TIA (transient ischemic attack) 11/15/2015   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   Past Surgical History:  Procedure Laterality Date   ABDOMINAL HYSTERECTOMY     APPENDECTOMY     bil carple tunnel surgery     CARDIAC CATHETERIZATION N/A 04/09/2015   Procedure: Left Heart Cath and Coronary Angiography;  Surgeon: Peter M Jordan, MD;  Location: [facility] INVASIVE CV LAB;  Service: Cardiovascular;  Laterality: N/A;   COLONOSCOPY  multiple   EP IMPLANTABLE DEVICE N/A 04/09/2016   Procedure: PPM Generator Changeout;  Surgeon: Danelle ORN Birmingham, MD;  Location: [facility] INVASIVE CV LAB;  Service: Cardiovascular;  Laterality: N/A;   ESOPHAGEAL MANOMETRY N/A 08/02/2017   Procedure: ESOPHAGEAL MANOMETRY (EM);  Surgeon: Shila Gustav GAILS, MD;  Location: [facility];  Service: Endoscopy;  Laterality: N/A;   KNEE SURGERY     right x 2   NECK SURGERY     x2   PACEMAKER  INSERTION     PH IMPEDANCE STUDY N/A 08/02/2017   Procedure: PH IMPEDANCE STUDY;  Surgeon: Shila Gustav GAILS, MD;  Location: [facility];  Service: Endoscopy;  Laterality: N/A;   UPPER GASTROINTESTINAL ENDOSCOPY     Patient Active Problem List   Diagnosis Date Noted   Paroxysmal atrial fibrillation (HCC) 06/27/2024   Pacemaker 07/22/2022   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) 01/14/2022   PAF (paroxysmal atrial fibrillation) (HCC) 01/15/2021   Sigmoid diverticulosis 06/21/2018   Dyslipidemia 05/10/2016   Elevated AST (SGOT) 05/10/2016   Dizziness 05/10/2016   Headache 05/10/2016   Cephalalgia    Complicated migraine    Migraine with visual aura    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/09/2016   Insomnia 12/10/2015   Type 2 diabetes mellitus without complication, without long-term current use of insulin  (HCC) 12/10/2015   TIA (transient ischemic attack) 11/15/2015   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   Depression 07/23/2015   Anxiety state 07/23/2015   Abnormal nuclear stress test 04/09/2015   DOE (dyspnea on exertion) 03/14/2015   Chest pain 03/14/2015   Edema leg 03/14/2015   Essential hypertension 08/11/2014   History of Serotonin syndrome w/ recurrent residual headaches 08/10/2014   Balance disorder 08/10/2014   B12 nutritional deficiency 08/10/2014   Edema 06/09/2013   Generalized anxiety disorder 05/09/2013  Mixed hyperlipidemia 04/11/2013   Spinal stenosis, unspecified region other than cervical 01/03/2013   Generalized muscle ache 11/29/2012   Low back pain 02/26/2012   Unspecified urinary incontinence 02/26/2012   Gastroesophageal reflux disease 12/14/2011   BRADYCARDIA 11/18/2010   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  Rationale for Evaluation and Treatment: Rehabilitation  SUBJECTIVE:                                                                                                                                                                                             SUBJECTIVE STATEMENT: Tried to cancel this appointment as she needs to go out of town d/t family member getting into car accident.    Pt accompanied by: self  PERTINENT HISTORY: BLE neuropathy  PAIN:  Are you having pain? No  PRECAUTIONS: Fall  RED FLAGS: None   WEIGHT BEARING RESTRICTIONS: No  FALLS: Has patient fallen in last 6 months? Yes. Number of falls 1, fell taking trashcans out, unable to get back up, no injury  LIVING ENVIRONMENT: Lives with: lives with their spouse Lives in: House/apartment Stairs: none in the house, 2 steps to enter w/ HR Has following equipment at home: Single point cane, shower chair, Grab bars, and    PLOF: Independent  PATIENT GOALS: maintain independence, improve balance, ideally not use an AD (but will if I have too)  OBJECTIVE:    TODAY'S TREATMENT: 10/02/2024 Activity Comments                        TODAY'S TREATMENT: 09/19/24 Activity Comments  Lars 36/56  review of HEP:  heel toe raise gastroc stretch Required demo and cueing   romberg EC 2x30 romberg foam 2x30 alt fwd step  alt backwards step Pt requires cues to reach for counter to prevent fall with romberg exercises. Balance improved with practice. Limited wt shift and focus on heel-toe pattern with stepping and pt was able to wean UE support                 HOME EXERCISE PROGRAM Last updated: 09/20/24 Access Code: [email or web address] URL: [email or web address] Date: 09/20/2024 Prepared by: [facility] - Outpatient  Rehab - [facility]  Program Notes Perform at kitchen counter top for  safety  Exercises - Heel Toe Raises with Counter Support  - 2-3 x weekly - 3 sets - 10 reps -  Standing Gastroc Stretch at [employer]  - 1 x daily - 2-3 x weekly - 3 reps - 30-60 sec hold - Alternating Step Forward with Support  - 1 x daily - 5 x weekly - 2 sets - 10 reps - Alternating Step Backward with Support  - 1 x daily - 5 x weekly - 2 sets - 10 reps   PATIENT EDUCATION: Education details: HEP update  Person educated: Patient Education method: Explanation, Demonstration, Tactile cues, Verbal cues, and Handouts Education comprehension: verbalized understanding and returned demonstration     Note: Objective measures were completed at Evaluation unless otherwise noted.  DIAGNOSTIC FINDINGS: Radiographs: Multiple views x-ray of both feet: no fracture, dislocation, swelling or degenerative changes noted and pes cavus foot type, no evidence of Charcot neuroarthropathyPhysical Exam: warm, good capillary refill, no trophic changes or ulcerative lesions, normal DP and PT pulses, abnormal sensory exam, she is grossly neuropathic, and flexible pes cavus foot type, there is tenderness and prominence of the second metatarsal head plantar bilateral.  COGNITION: Overall cognitive status: Within functional limits for tasks assessed   SENSATION: Reports numbness/tingling   COORDINATION:   EDEMA:  none      POSTURE: No Significant postural limitations  LOWER EXTREMITY ROM:     Active  Right Eval Left Eval  Hip flexion    Hip extension    Hip abduction    Hip adduction    Hip internal rotation    Hip external rotation    Knee flexion    Knee extension    Ankle dorsiflexion 10 10  Ankle plantarflexion    Ankle inversion    Ankle eversion     (Blank rows = not tested)  LOWER EXTREMITY MMT:    3+/5 gross BLE to seated resisted tests 10 reps for standing calf raise w/ compensations  BED MOBILITY:  Not tested  TRANSFERS: Modified independent Floor to stand: reports  unable to perform    CURB:  Not tested  STAIRS: Not tested GAIT: Findings: Comments: decreased stride  FUNCTIONAL TESTS:  5 times sit to stand: 27 sec w/ BUE support  M-CTSIB  Condition 1: Firm Surface, EO 30 Sec, Normal Sway  Condition 2: Firm Surface, EC 30 Sec, Mild Sway  Condition 3: Foam Surface, EO 0 Sec, unable Sway  Condition 4: Foam Surface, EC 0 Sec, unable Sway                                                                                                                                   TREATMENT DATE: 09/11/24    PATIENT EDUCATION: Education details: assessment details, rationale of PT intervention, sensory infor (vibration platform), HEP initiation Person educated: Patient Education method: Explanation and Handouts Education comprehension: needs further education  HOME EXERCISE PROGRAM: Access Code: [email or web address] URL: [email or web address] Date: 09/11/2024 Prepared by: Kelly Halpin  Exercises - Heel Toe Raises with Counter Support  - 2-3 x weekly - 3  sets - 10 reps - Standing Gastroc Stretch at Counter  - 1 x daily - 2-3 x weekly - 3 reps - 30-60 sec hold  GOALS: Goals reviewed with patient? Yes  SHORT TERM GOALS: Target date: 10/02/2024    Patient will be independent in HEP to improve functional outcomes Baseline: Goal status: IN PROGRESS  2.  Demo improved postural control/static balance per mild sway x 30 sec condition 3 M-CTSIB Baseline: unable Goal status: IN PROGRESS    LONG TERM GOALS: Target date: 11/06/2024    Demo improved BLE strength and reduced risk for falls per time 15 sec 5xSTS Baseline: 27 sec w/ BUE Goal status: IN PROGRESS  2.  Berg Balance Test score 50/56 for low risk for falls during ADL Baseline: 36/56 09/20/24 Goal status:IN PROGRESS 09/20/24  3.  Manifest improved postural control/proprioceptive awareness per mild-moderate sway condition 4 M-CTSIB to improve safety w/ ADL Baseline: unable Goal  status: IN PROGRESS  4.  Floor to stand w/ modified independence to improve functional mobility Baseline: reports dependence on assist Goal status: IN PROGRESS    ASSESSMENT:  CLINICAL IMPRESSION: Pt presents today ***. Skilled PT session focused on ***. Pt needs ***. Pt will continue to benefit from skilled PT towards goals for improved functional mobility and decreased fall risk.  Patient arrived to session without new complaints. Session focused on balance assessment, revealing Berg score of 36/56 which indicates an increased risk of falls. Reviewed HEP for max carryover and worked on progressing static and dynamic balance tasks. Initially with foot-flat stepping pattern but able to demo good heel-toe with cueing. Patient tolerated session well and without complaints at end of appointment.  OBJECTIVE IMPAIRMENTS: Abnormal gait, decreased activity tolerance, decreased endurance, decreased knowledge of use of DME, decreased ROM, decreased strength, impaired flexibility, impaired sensation, and pain.   ACTIVITY LIMITATIONS: carrying, lifting, bending, stairs, transfers, reach over head, and locomotion level  PARTICIPATION LIMITATIONS: meal prep, cleaning, community activity, and yard work  PERSONAL FACTORS: Age, Time since onset of injury/illness/exacerbation, and 1-2 comorbidities: PMH are also affecting patient's functional outcome.   REHAB POTENTIAL: Good  CLINICAL DECISION MAKING: Evolving/moderate complexity  EVALUATION COMPLEXITY: Moderate  PLAN:  PT FREQUENCY: 1x/week, anticipate 4-6 visits  PT DURATION: 8 weeks  PLANNED INTERVENTIONS: 97750- Physical Performance Testing, 97110-Therapeutic exercises, 97530- Therapeutic activity, 97112- Neuromuscular re-education, 97535- Self Care, 02859- Manual therapy, [phone]- Gait training, [phone]- Aquatic Therapy, [phone] (1-2 muscles), [phone] (3+ muscles)- Dry Needling, and Patient/Family education  PLAN FOR NEXT SESSION: ***HEP review and  progressions for strength/balance; heel-toe pattern    Greig Anon, PT 10/02/24 7:53 AM Phone: [phone] Fax: [phone]   [facility] Rehab at [facility] [address] [place], [place] [address] Phone # [phone] Fax # [phone]

## 2024-10-05 ENCOUNTER — Ambulatory Visit

## 2024-10-07 LAB — CUP PACEART REMOTE DEVICE CHECK
Battery Remaining Longevity: 17 mo
Battery Voltage: 2.91 V
Brady Statistic AP VP Percent: 0.31 %
Brady Statistic AP VS Percent: 98.06 %
Brady Statistic AS VP Percent: 0.01 %
Brady Statistic AS VS Percent: 1.62 %
Brady Statistic RA Percent Paced: 98.26 %
Brady Statistic RV Percent Paced: 0.42 %
Date Time Interrogation Session: 20251205090048
Implantable Lead Connection Status: 753985
Implantable Lead Connection Status: 753985
Implantable Lead Implant Date: 20070910
Implantable Lead Implant Date: 20070910
Implantable Lead Location: 753859
Implantable Lead Location: 753860
Implantable Lead Model: 5076
Implantable Lead Model: 5076
Implantable Pulse Generator Implant Date: 20170608
Lead Channel Impedance Value: 323 Ohm
Lead Channel Impedance Value: 418 Ohm
Lead Channel Impedance Value: 456 Ohm
Lead Channel Impedance Value: 494 Ohm
Lead Channel Pacing Threshold Amplitude: 0.875 V
Lead Channel Pacing Threshold Amplitude: 1 V
Lead Channel Pacing Threshold Pulse Width: 0.4 ms
Lead Channel Pacing Threshold Pulse Width: 0.4 ms
Lead Channel Sensing Intrinsic Amplitude: 13.375 mV
Lead Channel Sensing Intrinsic Amplitude: 13.375 mV
Lead Channel Sensing Intrinsic Amplitude: 2.5 mV
Lead Channel Sensing Intrinsic Amplitude: 2.5 mV
Lead Channel Setting Pacing Amplitude: 2 V
Lead Channel Setting Pacing Amplitude: 2.5 V
Lead Channel Setting Pacing Pulse Width: 0.4 ms
Lead Channel Setting Sensing Sensitivity: 2.8 mV
Zone Setting Status: 755011
Zone Setting Status: 755011

## 2024-10-08 ENCOUNTER — Ambulatory Visit: Payer: Self-pay | Admitting: Internal Medicine

## 2024-10-09 ENCOUNTER — Ambulatory Visit: Admitting: Physical Therapy

## 2024-10-09 NOTE — Therapy (Incomplete)
 OUTPATIENT PHYSICAL THERAPY NEURO TREATMENT   Patient Name: [name] MRN: [id number] DOB:Mar 05, 1941, 83 y.o., female Today's Date: 10/09/2024   PCP: Debrah Josette ORN, PA REFERRING PROVIDER: Silva Juliene SAUNDERS, DPM  END OF SESSION:     Past Medical History:  Diagnosis Date   Allergy    Arthritis    CAD (coronary artery disease)    a. mild by prior cath; coronary CT 04/2019.   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   TIA (transient ischemic attack) 11/15/2015   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   Past Surgical History:  Procedure Laterality Date   ABDOMINAL HYSTERECTOMY     APPENDECTOMY     bil carple tunnel surgery     CARDIAC CATHETERIZATION N/A 04/09/2015   Procedure: Left Heart Cath and Coronary Angiography;  Surgeon: Peter M Jordan, MD;  Location: [facility] INVASIVE CV LAB;  Service: Cardiovascular;  Laterality: N/A;   COLONOSCOPY  multiple   EP IMPLANTABLE DEVICE N/A 04/09/2016   Procedure: PPM Generator Changeout;  Surgeon: Danelle ORN Birmingham, MD;  Location: [facility] INVASIVE CV LAB;  Service: Cardiovascular;  Laterality: N/A;   ESOPHAGEAL MANOMETRY N/A 08/02/2017   Procedure: ESOPHAGEAL MANOMETRY (EM);  Surgeon: Shila Gustav GAILS, MD;  Location: [facility];  Service: Endoscopy;  Laterality: N/A;   KNEE SURGERY     right x 2   NECK SURGERY     x2   PACEMAKER  INSERTION     PH IMPEDANCE STUDY N/A 08/02/2017   Procedure: PH IMPEDANCE STUDY;  Surgeon: Shila Gustav GAILS, MD;  Location: [facility];  Service: Endoscopy;  Laterality: N/A;   UPPER GASTROINTESTINAL ENDOSCOPY     Patient Active Problem List   Diagnosis Date Noted   Paroxysmal atrial fibrillation (HCC) 06/27/2024   Pacemaker 07/22/2022   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) 01/14/2022   PAF (paroxysmal atrial fibrillation) (HCC) 01/15/2021   Sigmoid diverticulosis 06/21/2018   Dyslipidemia 05/10/2016   Elevated AST (SGOT) 05/10/2016   Dizziness 05/10/2016   Headache 05/10/2016   Cephalalgia    Complicated migraine    Migraine with visual aura    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/09/2016   Insomnia 12/10/2015   Type 2 diabetes mellitus without complication, without long-term current use of insulin  (HCC) 12/10/2015   TIA (transient ischemic attack) 11/15/2015   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   Depression 07/23/2015   Anxiety state 07/23/2015   Abnormal nuclear stress test 04/09/2015   DOE (dyspnea on exertion) 03/14/2015   Chest pain 03/14/2015   Edema leg 03/14/2015   Essential hypertension 08/11/2014   History of Serotonin syndrome w/ recurrent residual headaches 08/10/2014   Balance disorder 08/10/2014   B12 nutritional deficiency 08/10/2014   Edema 06/09/2013   Generalized anxiety disorder 05/09/2013  Mixed hyperlipidemia 04/11/2013   Spinal stenosis, unspecified region other than cervical 01/03/2013   Generalized muscle ache 11/29/2012   Low back pain 02/26/2012   Unspecified urinary incontinence 02/26/2012   Gastroesophageal reflux disease 12/14/2011   BRADYCARDIA 11/18/2010   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  Rationale for Evaluation and Treatment: Rehabilitation  SUBJECTIVE:                                                                                                                                                                                             SUBJECTIVE STATEMENT: Tried to cancel this appointment as she needs to go out of town d/t family member getting into car accident.    Pt accompanied by: self  PERTINENT HISTORY: BLE neuropathy  PAIN:  Are you having pain? No  PRECAUTIONS: Fall  RED FLAGS: None   WEIGHT BEARING RESTRICTIONS: No  FALLS: Has patient fallen in last 6 months? Yes. Number of falls 1, fell taking trashcans out, unable to get back up, no injury  LIVING ENVIRONMENT: Lives with: lives with their spouse Lives in: House/apartment Stairs: none in the house, 2 steps to enter w/ HR Has following equipment at home: Single point cane, shower chair, Grab bars, and    PLOF: Independent  PATIENT GOALS: maintain independence, improve balance, ideally not use an AD (but will if I have too)  OBJECTIVE:    TODAY'S TREATMENT: 10/09/2024 Activity Comments                        TODAY'S TREATMENT: 09/19/24 Activity Comments  Lars 36/56  review of HEP:  heel toe raise gastroc stretch Required demo and cueing   romberg EC 2x30 romberg foam 2x30 alt fwd step  alt backwards step Pt requires cues to reach for counter to prevent fall with romberg exercises. Balance improved with practice. Limited wt shift and focus on heel-toe pattern with stepping and pt was able to wean UE support                 HOME EXERCISE PROGRAM Last updated: 09/20/24 Access Code: [email or web address] URL: [email or web address] Date: 09/20/2024 Prepared by: [facility] - Outpatient  Rehab - [facility]  Program Notes Perform at kitchen counter top for  safety  Exercises - Heel Toe Raises with Counter Support  - 2-3 x weekly - 3 sets - 10 reps -  Standing Gastroc Stretch at [employer]  - 1 x daily - 2-3 x weekly - 3 reps - 30-60 sec hold - Alternating Step Forward with Support  - 1 x daily - 5 x weekly - 2 sets - 10 reps - Alternating Step Backward with Support  - 1 x daily - 5 x weekly - 2 sets - 10 reps   PATIENT EDUCATION: Education details: HEP update  Person educated: Patient Education method: Explanation, Demonstration, Tactile cues, Verbal cues, and Handouts Education comprehension: verbalized understanding and returned demonstration     Note: Objective measures were completed at Evaluation unless otherwise noted.  DIAGNOSTIC FINDINGS: Radiographs: Multiple views x-ray of both feet: no fracture, dislocation, swelling or degenerative changes noted and pes cavus foot type, no evidence of Charcot neuroarthropathyPhysical Exam: warm, good capillary refill, no trophic changes or ulcerative lesions, normal DP and PT pulses, abnormal sensory exam, she is grossly neuropathic, and flexible pes cavus foot type, there is tenderness and prominence of the second metatarsal head plantar bilateral.  COGNITION: Overall cognitive status: Within functional limits for tasks assessed   SENSATION: Reports numbness/tingling   COORDINATION:   EDEMA:  none      POSTURE: No Significant postural limitations  LOWER EXTREMITY ROM:     Active  Right Eval Left Eval  Hip flexion    Hip extension    Hip abduction    Hip adduction    Hip internal rotation    Hip external rotation    Knee flexion    Knee extension    Ankle dorsiflexion 10 10  Ankle plantarflexion    Ankle inversion    Ankle eversion     (Blank rows = not tested)  LOWER EXTREMITY MMT:    3+/5 gross BLE to seated resisted tests 10 reps for standing calf raise w/ compensations  BED MOBILITY:  Not tested  TRANSFERS: Modified independent Floor to stand: reports  unable to perform    CURB:  Not tested  STAIRS: Not tested GAIT: Findings: Comments: decreased stride  FUNCTIONAL TESTS:  5 times sit to stand: 27 sec w/ BUE support  M-CTSIB  Condition 1: Firm Surface, EO 30 Sec, Normal Sway  Condition 2: Firm Surface, EC 30 Sec, Mild Sway  Condition 3: Foam Surface, EO 0 Sec, unable Sway  Condition 4: Foam Surface, EC 0 Sec, unable Sway                                                                                                                                   TREATMENT DATE: 09/11/24    PATIENT EDUCATION: Education details: assessment details, rationale of PT intervention, sensory infor (vibration platform), HEP initiation Person educated: Patient Education method: Explanation and Handouts Education comprehension: needs further education  HOME EXERCISE PROGRAM: Access Code: [email or web address] URL: [email or web address] Date: 09/11/2024 Prepared by: Kelly Halpin  Exercises - Heel Toe Raises with Counter Support  - 2-3 x weekly - 3  sets - 10 reps - Standing Gastroc Stretch at Counter  - 1 x daily - 2-3 x weekly - 3 reps - 30-60 sec hold  GOALS: Goals reviewed with patient? Yes  SHORT TERM GOALS: Target date: 10/02/2024    Patient will be independent in HEP to improve functional outcomes Baseline: Goal status: IN PROGRESS  2.  Demo improved postural control/static balance per mild sway x 30 sec condition 3 M-CTSIB Baseline: unable Goal status: IN PROGRESS    LONG TERM GOALS: Target date: 11/06/2024    Demo improved BLE strength and reduced risk for falls per time 15 sec 5xSTS Baseline: 27 sec w/ BUE Goal status: IN PROGRESS  2.  Berg Balance Test score 50/56 for low risk for falls during ADL Baseline: 36/56 09/20/24 Goal status:IN PROGRESS 09/20/24  3.  Manifest improved postural control/proprioceptive awareness per mild-moderate sway condition 4 M-CTSIB to improve safety w/ ADL Baseline: unable Goal  status: IN PROGRESS  4.  Floor to stand w/ modified independence to improve functional mobility Baseline: reports dependence on assist Goal status: IN PROGRESS    ASSESSMENT:  CLINICAL IMPRESSION: Pt presents today ***. Skilled PT session focused on ***. Pt needs ***. Pt will continue to benefit from skilled PT towards goals for improved functional mobility and decreased fall risk.  Patient arrived to session without new complaints. Session focused on balance assessment, revealing Berg score of 36/56 which indicates an increased risk of falls. Reviewed HEP for max carryover and worked on progressing static and dynamic balance tasks. Initially with foot-flat stepping pattern but able to demo good heel-toe with cueing. Patient tolerated session well and without complaints at end of appointment.  OBJECTIVE IMPAIRMENTS: Abnormal gait, decreased activity tolerance, decreased endurance, decreased knowledge of use of DME, decreased ROM, decreased strength, impaired flexibility, impaired sensation, and pain.   ACTIVITY LIMITATIONS: carrying, lifting, bending, stairs, transfers, reach over head, and locomotion level  PARTICIPATION LIMITATIONS: meal prep, cleaning, community activity, and yard work  PERSONAL FACTORS: Age, Time since onset of injury/illness/exacerbation, and 1-2 comorbidities: PMH are also affecting patient's functional outcome.   REHAB POTENTIAL: Good  CLINICAL DECISION MAKING: Evolving/moderate complexity  EVALUATION COMPLEXITY: Moderate  PLAN:  PT FREQUENCY: 1x/week, anticipate 4-6 visits  PT DURATION: 8 weeks  PLANNED INTERVENTIONS: 97750- Physical Performance Testing, 97110-Therapeutic exercises, 97530- Therapeutic activity, 97112- Neuromuscular re-education, 97535- Self Care, 02859- Manual therapy, [phone]- Gait training, [phone]- Aquatic Therapy, [phone] (1-2 muscles), 20561 (3+ muscles)- Dry Needling, and Patient/Family education  PLAN FOR NEXT SESSION: ***HEP review and  progressions for strength/balance; heel-toe pattern    Greig Anon, PT 10/09/24 7:55 AM Phone: [phone] Fax: [phone]   [facility] Outpatient Rehab at [facility] [address], [address] [place], [place] [address] Phone # [phone] Fax # [phone]

## 2024-10-10 ENCOUNTER — Telehealth: Payer: Self-pay | Admitting: Physical Therapy

## 2024-10-10 NOTE — Telephone Encounter (Signed)
 LVM regarding cancelled appointment yesterday (no further PT appointments scheduled).  Offered patient to call back to reschedule, as there is availability for appointments today.  Greig Anon, PT 10/10/24 8:58 AM Phone: [phone] Fax: [phone]  [facility] at [facility] [address] [place], [address] [place], [place] [address] Phone # [phone] Fax # [phone]

## 2024-10-11 NOTE — Progress Notes (Signed)
 Remote PPM Transmission

## 2024-10-23 ENCOUNTER — Other Ambulatory Visit: Payer: Self-pay

## 2024-10-24 MED ORDER — FUROSEMIDE 20 MG PO TABS: ORAL_TABLET | ORAL | 3 refills | Status: AC

## 2024-11-13 ENCOUNTER — Ambulatory Visit (HOSPITAL_COMMUNITY)
Admission: RE | Admit: 2024-11-13 | Discharge: 2024-11-13 | Disposition: A | Discharge status: Home / Self Care | Source: Ambulatory Visit | Attending: Physician Assistant | Admitting: Physician Assistant

## 2024-11-13 VITALS — BP 140/78 | HR 71 | Ht 66.0 in | Wt 158.4 lb

## 2024-11-13 DIAGNOSIS — I4891 Unspecified atrial fibrillation: Secondary | ICD-10-CM

## 2024-11-13 DIAGNOSIS — I480 Paroxysmal atrial fibrillation: Secondary | ICD-10-CM

## 2024-11-13 DIAGNOSIS — D6869 Other thrombophilia: Secondary | ICD-10-CM | POA: Diagnosis not present

## 2024-11-13 DIAGNOSIS — Z5181 Encounter for therapeutic drug level monitoring: Secondary | ICD-10-CM

## 2024-11-13 DIAGNOSIS — Z79899 Other long term (current) drug therapy: Secondary | ICD-10-CM | POA: Diagnosis not present

## 2024-11-13 NOTE — Progress Notes (Signed)
 "   Primary Care Physician: Debrah Josette ORN., PA-C Primary Cardiologist: Peter Jordan, MD Electrophysiologist: Danelle Birmingham, MD  Referring Physician: Daphne Barrack NP   [name] is a 84 y.o. female with a history of SSS s/p PPM, CHF, atrial fibrillation who presents for follow up in the [facility].  The patient was seen by Dr Nancey 04/12/24 with an increased burden of afib and acute CHF symptoms. Dofetilide  recommended. Patient is on Eliquis  for stroke prevention. S/p Tikosyn  admission 8/26-29/2025. Patient presented in SR and did not require cardioversion.   Patient returns for follow up for atrial fibrillation and dofetilide  monitoring. She remains in SR today and feels well. No bleeding issues on anticoagulation.   Today, she  denies symptoms of palpitations, chest pain, shortness of breath, orthopnea, PND, lower extremity edema, dizziness, presyncope, syncope, snoring, daytime somnolence, bleeding, or neurologic sequela. The patient is tolerating medications without difficulties and is otherwise without complaint today.    Atrial Fibrillation Risk Factors:  she does not have symptoms or diagnosis of sleep apnea. she does not have a history of rheumatic fever.   Atrial Fibrillation Management history:  Previous antiarrhythmic drugs: tikosyn  Previous cardioversions: none Previous ablations: none Anticoagulation history: Eliquis   ROS- All systems are reviewed and negative except as per the HPI above.  Past Medical History:  Diagnosis Date   Allergy    Arthritis    CAD (coronary artery disease)    a. mild by prior cath; coronary CT 04/2019.   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   Hyperlipidemia     Hypertension    Internal and external hemorrhoids without complication    Melanoma (HCC) 05/04/2024   Right upper arm - Mohs 05/29/2024 (Dr Corey)   Migraine with visual aura    Myocardial infarction (HCC)    PAF (paroxysmal atrial fibrillation) (HCC)    Potassium deficiency    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/09/2016   TIA (transient ischemic attack) 11/15/2015   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 30 tablet 1   dapagliflozin  propanediol (FARXIGA ) 10 MG TABS tablet Take 1 tablet (10 mg total) by mouth daily before breakfast. 90 tablet 3   dofetilide  (TIKOSYN ) 250 MCG capsule Take 1 capsule (250 mcg total) by mouth 2 (two) times daily. 180 capsule 1   Evolocumab  (REPATHA  SURECLICK) 140 MG/ML SOAJ ADMINISTER 1 ML UNDER SKIN EVERY 14 DAYS 6 mL 3   ferrous sulfate 325 (65 FE) MG tablet Take 325 mg by mouth every morning.     furosemide  (LASIX ) 20 MG tablet If blood pressure is below 110 do not take lasix . If blood pressure is 110-130 take 1 tablet (20 mg) by mouth of lasix . If blood pressure is above 130 take 2 tablets (40 mg) by mouth of lasix . 60 tablet 3   glipiZIDE  (GLUCOTROL ) 5 MG tablet Take 5 mg by mouth 2 (two) times daily before a meal.  lidocaine -prilocaine  (EMLA ) cream APPLY 1 APPLICATION TOPICALLY AS NEEDED 30 g 0   losartan  (COZAAR ) 100 MG tablet Take 1 tablet (100 mg total) by mouth daily. 90 tablet 3   pantoprazole  (PROTONIX ) 40 MG tablet Take 1 tablet (40 mg total) by mouth 2 (two) times daily before a meal. 60 tablet 2   potassium chloride  SA (KLOR-CON  M) 20 MEQ tablet TAKE 1 TABLET(20 MEQ) BY MOUTH DAILY 90 tablet 3   Vitamin D, Ergocalciferol, (DRISDOL) 1.25 MG (50000 UNIT) CAPS capsule Take  50,000 Units by mouth once a week.     No current facility-administered medications for this encounter.    Physical Exam: BP (!) 140/78   Pulse 71   Ht 5' 6 (1.676 m)   Wt 71.8 kg   BMI 25.57 kg/m   GEN: Well nourished, well developed in no acute distress CARDIAC: Regular rate and rhythm, no murmurs, rubs, gallops RESPIRATORY:  Clear to auscultation without rales, wheezing or rhonchi  ABDOMEN: Soft, non-tender, non-distended EXTREMITIES:  No edema; No deformity    Wt Readings from Last 3 Encounters:  11/13/24 71.8 kg  08/22/24 73.9 kg  08/21/24 73.5 kg     EKG Interpretation Date/Time:  Monday November 13 2024 10:19:53 EST Ventricular Rate:  71 PR Interval:  260 QRS Duration:  70 QT Interval:  428 QTC Calculation: 465 R Axis:   111  Text Interpretation: Atrial-paced rhythm with prolonged AV conduction Right axis deviation Abnormal ECG When compared with ECG of 07-Aug-2024 09:37, No significant change was found Confirmed by Darlene Brozowski ([id number]) on 11/13/2024 10:32:37 AM    Echo 08/25/23 demonstrated   1. Left ventricular ejection fraction, by estimation, is 60 to 65%. Left  ventricular ejection fraction by 3D volume is 60 %. The left ventricle has  normal function. The left ventricle has no regional wall motion  abnormalities. Left ventricular diastolic parameters are consistent with Grade II diastolic dysfunction (pseudonormalization). Elevated left ventricular end-diastolic pressure. The E/e' is 15.   2. Right ventricular systolic function is normal. The right ventricular  size is normal. There is normal pulmonary artery systolic pressure. The  estimated right ventricular systolic pressure is 25.1 mmHg.   3. Left atrial size was mildly dilated.   4. The mitral valve is grossly normal. Mild mitral valve regurgitation.   5. The aortic valve is tricuspid. Aortic valve regurgitation is not  visualized. Aortic valve sclerosis is present, with no evidence of aortic  valve  stenosis.   6. The inferior vena cava is normal in size with greater than 50%  respiratory variability, suggesting right atrial pressure of 3 mmHg.   Comparison(s): Changes from prior study are noted. 05/08/2019: LVEF 60-65%,  normal diastolic function.    CHA2DS2-VASc Score = 8  The patient's score is based upon: CHF History: 0 HTN History: 1 Diabetes History: 1 Stroke History: 2 Vascular Disease History: 1 Age Score: 2 Gender Score: 1       ASSESSMENT AND PLAN: Paroxysmal Atrial Fibrillation (ICD10:  I48.0) The patient's CHA2DS2-VASc score is 8, indicating a 10.8% annual risk of stroke.   S/p dofetilide  admission 06/2024 Patient appears to be maintaining SR Continue dofetilide  250 mcg BID Continue Eliquis  5 mg BID  Secondary Hypercoagulable State (ICD10:  D68.69) The patient is at significant risk for stroke/thromboembolism based upon her CHA2DS2-VASc Score of 8.  Continue Apixaban  (Eliquis ). No bleeding issues.   High Risk Medication Monitoring (ICD 10: [id number]) Patient requires ongoing monitoring for anti-arrhythmic medication which has the potential  to cause life threatening arrhythmias. QT interval on ECG acceptable for dofetilide  monitoring. Check bmet/mag today.     HTN Stable on current regimen  CAD No anginal symptoms Followed by Dr Jordan   Follow up in the AF clinic in 3 months.       [facility] [address] [place],  [address] [phone] "

## 2024-11-14 ENCOUNTER — Ambulatory Visit (HOSPITAL_COMMUNITY): Payer: Self-pay | Admitting: Physician Assistant

## 2024-11-14 LAB — BASIC METABOLIC PANEL WITH GFR
BUN/Creatinine Ratio: 23 (ref 12–28)
BUN: 20 mg/dL (ref 8–27)
CO2: 23 mmol/L (ref 20–29)
Calcium: 9.3 mg/dL (ref 8.7–10.3)
Chloride: 107 mmol/L — ABNORMAL HIGH (ref 96–106)
Creatinine, Ser: 0.86 mg/dL (ref 0.57–1.00)
Glucose: 96 mg/dL (ref 70–99)
Potassium: 4.4 mmol/L (ref 3.5–5.2)
Sodium: 147 mmol/L — ABNORMAL HIGH (ref 134–144)
eGFR: 68 mL/min/1.73

## 2024-11-14 LAB — MAGNESIUM: Magnesium: 2.1 mg/dL (ref 1.6–2.3)

## 2024-12-11 ENCOUNTER — Ambulatory Visit: Admitting: Physician Assistant

## 2025-01-04 ENCOUNTER — Ambulatory Visit

## 2025-02-13 ENCOUNTER — Ambulatory Visit (HOSPITAL_COMMUNITY): Admitting: Physician Assistant

## 2025-04-05 ENCOUNTER — Ambulatory Visit

## 2025-07-05 ENCOUNTER — Ambulatory Visit

## 2025-08-23 ENCOUNTER — Ambulatory Visit: Admitting: Podiatry
